# Patient Record
Sex: Male | Born: 1940 | Race: White | Hispanic: No | Marital: Married | Smoking: Former smoker
Health system: Southern US, Community
[De-identification: ages and names within clinical notes are randomized; demographics above are authoritative.]

## PROBLEM LIST (undated history)

## (undated) ENCOUNTER — Ambulatory Visit

## (undated) ENCOUNTER — Telehealth

## (undated) ENCOUNTER — Encounter

## (undated) ENCOUNTER — Ambulatory Visit: Payer: MEDICARE

## (undated) ENCOUNTER — Encounter
Attending: Student in an Organized Health Care Education/Training Program | Primary: Student in an Organized Health Care Education/Training Program

## (undated) ENCOUNTER — Ambulatory Visit: Payer: MEDICARE | Attending: Internal Medicine | Primary: Internal Medicine

## (undated) ENCOUNTER — Encounter: Attending: Internal Medicine | Primary: Internal Medicine

## (undated) ENCOUNTER — Other Ambulatory Visit

## (undated) ENCOUNTER — Encounter: Attending: Gastroenterology | Primary: Gastroenterology

## (undated) ENCOUNTER — Telehealth: Attending: Emergency Medicine | Primary: Emergency Medicine

## (undated) ENCOUNTER — Ambulatory Visit: Payer: MEDICARE | Attending: Specialist | Primary: Specialist

## (undated) ENCOUNTER — Inpatient Hospital Stay

## (undated) ENCOUNTER — Telehealth: Attending: Internal Medicine | Primary: Internal Medicine

## (undated) ENCOUNTER — Ambulatory Visit: Payer: MEDICARE | Attending: Gastroenterology | Primary: Gastroenterology

## (undated) ENCOUNTER — Ambulatory Visit
Payer: MEDICARE | Attending: Student in an Organized Health Care Education/Training Program | Primary: Student in an Organized Health Care Education/Training Program

## (undated) ENCOUNTER — Encounter: Attending: General Acute Care Hospital | Primary: General Acute Care Hospital

## (undated) ENCOUNTER — Ambulatory Visit: Payer: MEDICARE | Attending: Nurse Practitioner | Primary: Nurse Practitioner

## (undated) ENCOUNTER — Encounter: Attending: Specialist | Primary: Specialist

## (undated) ENCOUNTER — Ambulatory Visit: Payer: BLUE CROSS/BLUE SHIELD

## (undated) ENCOUNTER — Telehealth: Attending: Gastroenterology | Primary: Gastroenterology

## (undated) ENCOUNTER — Ambulatory Visit
Payer: BLUE CROSS/BLUE SHIELD | Attending: Student in an Organized Health Care Education/Training Program | Primary: Student in an Organized Health Care Education/Training Program

## (undated) DIAGNOSIS — C229 Malignant neoplasm of liver, not specified as primary or secondary: Secondary | ICD-10-CM

## (undated) DIAGNOSIS — K5090 Crohn's disease, unspecified, without complications: Secondary | ICD-10-CM

---

## 2017-08-01 ENCOUNTER — Ambulatory Visit: Admission: RE | Admit: 2017-08-01 | Discharge: 2017-08-01 | Payer: MEDICARE

## 2018-01-20 ENCOUNTER — Ambulatory Visit: Admit: 2018-01-20 | Discharge: 2018-01-22 | Disposition: A | Discharge status: Home / Self Care | Payer: MEDICARE

## 2018-01-20 DIAGNOSIS — K56609 Unspecified intestinal obstruction, unspecified as to partial versus complete obstruction: Principal | ICD-10-CM

## 2018-01-20 DIAGNOSIS — I10 Essential (primary) hypertension: Secondary | ICD-10-CM | POA: Diagnosis present

## 2018-01-21 DIAGNOSIS — K56609 Unspecified intestinal obstruction, unspecified as to partial versus complete obstruction: Principal | ICD-10-CM

## 2018-01-29 ENCOUNTER — Ambulatory Visit: Admit: 2018-01-29 | Discharge: 2018-01-30 | Payer: MEDICARE

## 2018-01-29 ENCOUNTER — Ambulatory Visit: Admit: 2018-01-29 | Discharge: 2018-01-30 | Payer: MEDICARE | Attending: Adult Health | Primary: Adult Health

## 2018-01-29 DIAGNOSIS — K435 Parastomal hernia without obstruction or  gangrene: Secondary | ICD-10-CM

## 2018-01-29 DIAGNOSIS — K7581 Nonalcoholic steatohepatitis (NASH): Secondary | ICD-10-CM

## 2018-01-29 DIAGNOSIS — K56609 Unspecified intestinal obstruction, unspecified as to partial versus complete obstruction: Secondary | ICD-10-CM

## 2018-01-29 DIAGNOSIS — K50812 Crohn's disease of both small and large intestine with intestinal obstruction: Principal | ICD-10-CM

## 2018-02-02 ENCOUNTER — Ambulatory Visit: Admit: 2018-02-02 | Discharge: 2018-02-03 | Payer: MEDICARE

## 2018-02-02 DIAGNOSIS — R109 Unspecified abdominal pain: Principal | ICD-10-CM

## 2018-02-11 ENCOUNTER — Ambulatory Visit: Admit: 2018-02-11 | Discharge: 2018-02-12 | Payer: MEDICARE | Attending: Specialist | Primary: Specialist

## 2018-02-11 DIAGNOSIS — K435 Parastomal hernia without obstruction or  gangrene: Secondary | ICD-10-CM

## 2018-02-11 DIAGNOSIS — K56609 Unspecified intestinal obstruction, unspecified as to partial versus complete obstruction: Principal | ICD-10-CM

## 2018-02-16 ENCOUNTER — Ambulatory Visit: Admit: 2018-02-16 | Discharge: 2018-02-17 | Payer: MEDICARE

## 2018-03-19 ENCOUNTER — Ambulatory Visit: Admit: 2018-03-19 | Discharge: 2018-03-19 | Payer: MEDICARE

## 2018-03-19 ENCOUNTER — Encounter
Admit: 2018-03-19 | Discharge: 2018-03-19 | Payer: MEDICARE | Attending: Certified Registered" | Primary: Certified Registered"

## 2018-03-19 DIAGNOSIS — K5080 Crohn's disease of both small and large intestine without complications: Principal | ICD-10-CM

## 2018-04-08 ENCOUNTER — Ambulatory Visit: Admit: 2018-04-08 | Discharge: 2018-04-09 | Payer: MEDICARE | Attending: Specialist | Primary: Specialist

## 2018-04-08 DIAGNOSIS — K435 Parastomal hernia without obstruction or  gangrene: Principal | ICD-10-CM

## 2018-04-08 MED ORDER — PANTOPRAZOLE 40 MG TABLET,DELAYED RELEASE
ORAL_TABLET | Freq: Every day | ORAL | 0 refills | 0 days | Status: CP
Start: 2018-04-08 — End: ?

## 2018-04-29 ENCOUNTER — Ambulatory Visit: Admit: 2018-04-29 | Discharge: 2018-04-30 | Payer: MEDICARE | Attending: Gastroenterology | Primary: Gastroenterology

## 2018-04-29 DIAGNOSIS — K5730 Diverticulosis of large intestine without perforation or abscess without bleeding: Secondary | ICD-10-CM

## 2018-04-29 DIAGNOSIS — K760 Fatty (change of) liver, not elsewhere classified: Secondary | ICD-10-CM

## 2018-04-29 DIAGNOSIS — E6609 Other obesity due to excess calories: Secondary | ICD-10-CM

## 2018-04-29 DIAGNOSIS — R945 Abnormal results of liver function studies: Secondary | ICD-10-CM

## 2018-04-29 DIAGNOSIS — K5080 Crohn's disease of both small and large intestine without complications: Secondary | ICD-10-CM

## 2018-04-29 DIAGNOSIS — Z6830 Body mass index (BMI) 30.0-30.9, adult: Secondary | ICD-10-CM

## 2018-04-29 DIAGNOSIS — K7581 Nonalcoholic steatohepatitis (NASH): Secondary | ICD-10-CM

## 2018-04-29 DIAGNOSIS — K5289 Other specified noninfective gastroenteritis and colitis: Secondary | ICD-10-CM

## 2018-04-29 DIAGNOSIS — K50819 Crohn's disease of both small and large intestine with unspecified complications: Principal | ICD-10-CM

## 2018-05-07 ENCOUNTER — Ambulatory Visit: Admit: 2018-05-07 | Discharge: 2018-05-08 | Payer: MEDICARE

## 2018-05-07 DIAGNOSIS — I509 Heart failure, unspecified: Secondary | ICD-10-CM

## 2018-05-07 DIAGNOSIS — I420 Dilated cardiomyopathy: Principal | ICD-10-CM

## 2018-05-25 ENCOUNTER — Ambulatory Visit: Admit: 2018-05-25 | Discharge: 2018-05-26 | Payer: MEDICARE

## 2018-05-25 DIAGNOSIS — Z01818 Encounter for other preprocedural examination: Principal | ICD-10-CM

## 2018-05-25 DIAGNOSIS — K435 Parastomal hernia without obstruction or  gangrene: Principal | ICD-10-CM

## 2018-06-02 ENCOUNTER — Ambulatory Visit
Admit: 2018-06-02 | Discharge: 2018-06-09 | Disposition: A | Discharge status: Home / Self Care | Payer: MEDICARE | Admitting: Specialist

## 2018-06-02 ENCOUNTER — Encounter
Admit: 2018-06-02 | Discharge: 2018-06-09 | Disposition: A | Discharge status: Home / Self Care | Payer: MEDICARE | Attending: Anesthesiology | Admitting: Specialist

## 2018-06-02 DIAGNOSIS — K435 Parastomal hernia without obstruction or  gangrene: Principal | ICD-10-CM

## 2018-06-04 DIAGNOSIS — K435 Parastomal hernia without obstruction or  gangrene: Principal | ICD-10-CM

## 2018-06-09 MED ORDER — OXYCODONE 5 MG TABLET
ORAL_TABLET | Freq: Three times a day (TID) | ORAL | 0 refills | 0.00000 days | Status: CP | PRN
Start: 2018-06-09 — End: 2018-06-23

## 2018-06-12 ENCOUNTER — Ambulatory Visit: Admit: 2018-06-12 | Discharge: 2018-06-13 | Payer: MEDICARE | Attending: Specialist | Primary: Specialist

## 2018-06-12 DIAGNOSIS — K435 Parastomal hernia without obstruction or  gangrene: Principal | ICD-10-CM

## 2018-06-18 ENCOUNTER — Ambulatory Visit: Admit: 2018-06-18 | Discharge: 2018-06-23 | Disposition: A | Discharge status: Home / Self Care | Payer: MEDICARE

## 2018-06-18 DIAGNOSIS — K56609 Unspecified intestinal obstruction, unspecified as to partial versus complete obstruction: Principal | ICD-10-CM

## 2018-06-19 DIAGNOSIS — K56609 Unspecified intestinal obstruction, unspecified as to partial versus complete obstruction: Principal | ICD-10-CM

## 2018-06-20 DIAGNOSIS — K56609 Unspecified intestinal obstruction, unspecified as to partial versus complete obstruction: Principal | ICD-10-CM

## 2018-06-23 MED ORDER — CIPROFLOXACIN 500 MG TABLET
ORAL_TABLET | Freq: Two times a day (BID) | ORAL | 0 refills | 0 days | Status: CP
Start: 2018-06-23 — End: 2018-06-25

## 2018-06-24 ENCOUNTER — Ambulatory Visit: Admit: 2018-06-24 | Discharge: 2018-06-25 | Payer: MEDICARE | Attending: Specialist | Primary: Specialist

## 2018-06-24 DIAGNOSIS — K435 Parastomal hernia without obstruction or  gangrene: Principal | ICD-10-CM

## 2018-07-08 ENCOUNTER — Ambulatory Visit: Admit: 2018-07-08 | Discharge: 2018-07-09 | Payer: MEDICARE

## 2018-07-08 ENCOUNTER — Ambulatory Visit: Admit: 2018-07-08 | Discharge: 2018-07-09 | Payer: MEDICARE | Attending: Gastroenterology | Primary: Gastroenterology

## 2018-07-08 DIAGNOSIS — Z8719 Personal history of other diseases of the digestive system: Principal | ICD-10-CM

## 2018-07-08 DIAGNOSIS — R197 Diarrhea, unspecified: Principal | ICD-10-CM

## 2018-07-08 DIAGNOSIS — K50819 Crohn's disease of both small and large intestine with unspecified complications: Secondary | ICD-10-CM

## 2018-07-09 ENCOUNTER — Ambulatory Visit: Admit: 2018-07-09 | Discharge: 2018-07-09 | Payer: MEDICARE

## 2018-07-09 DIAGNOSIS — R945 Abnormal results of liver function studies: Secondary | ICD-10-CM

## 2018-07-09 DIAGNOSIS — K7581 Nonalcoholic steatohepatitis (NASH): Secondary | ICD-10-CM

## 2018-07-09 DIAGNOSIS — K50819 Crohn's disease of both small and large intestine with unspecified complications: Principal | ICD-10-CM

## 2018-07-09 DIAGNOSIS — K5289 Other specified noninfective gastroenteritis and colitis: Secondary | ICD-10-CM

## 2018-07-10 MED ORDER — PREDNISONE 20 MG TABLET
ORAL_TABLET | 0 refills | 0 days | Status: CP
Start: 2018-07-10 — End: 2019-01-11

## 2018-07-22 ENCOUNTER — Ambulatory Visit: Admit: 2018-07-22 | Discharge: 2018-07-23 | Payer: MEDICARE | Attending: Specialist | Primary: Specialist

## 2018-07-22 DIAGNOSIS — K435 Parastomal hernia without obstruction or  gangrene: Principal | ICD-10-CM

## 2018-07-23 ENCOUNTER — Ambulatory Visit: Admit: 2018-07-23 | Discharge: 2018-07-24 | Payer: MEDICARE | Attending: Gastroenterology | Primary: Gastroenterology

## 2018-07-23 DIAGNOSIS — K5080 Crohn's disease of both small and large intestine without complications: Principal | ICD-10-CM

## 2018-07-23 DIAGNOSIS — K7581 Nonalcoholic steatohepatitis (NASH): Secondary | ICD-10-CM

## 2018-07-23 DIAGNOSIS — K5730 Diverticulosis of large intestine without perforation or abscess without bleeding: Secondary | ICD-10-CM

## 2018-07-23 DIAGNOSIS — K5289 Other specified noninfective gastroenteritis and colitis: Secondary | ICD-10-CM

## 2018-07-23 DIAGNOSIS — R945 Abnormal results of liver function studies: Secondary | ICD-10-CM

## 2018-08-10 ENCOUNTER — Emergency Department
Admit: 2018-08-10 | Discharge: 2018-08-10 | Disposition: A | Discharge status: Other Acute Inpatient Hospital | Payer: MEDICARE | Attending: Emergency Medicine

## 2018-08-10 ENCOUNTER — Ambulatory Visit
Admit: 2018-08-10 | Discharge: 2018-08-12 | Disposition: A | Discharge status: Home / Self Care | Payer: MEDICARE | Source: Other Acute Inpatient Hospital | Admitting: Specialist

## 2018-08-10 ENCOUNTER — Encounter
Admit: 2018-08-10 | Discharge: 2018-08-12 | Disposition: A | Discharge status: Home / Self Care | Payer: MEDICARE | Source: Other Acute Inpatient Hospital | Attending: Anesthesiology | Admitting: Specialist

## 2018-08-10 ENCOUNTER — Ambulatory Visit
Admit: 2018-08-10 | Discharge: 2018-08-10 | Disposition: A | Discharge status: Other Acute Inpatient Hospital | Payer: MEDICARE | Attending: Emergency Medicine

## 2018-08-10 DIAGNOSIS — L02211 Cutaneous abscess of abdominal wall: Principal | ICD-10-CM

## 2018-08-10 DIAGNOSIS — L539 Erythematous condition, unspecified: Principal | ICD-10-CM

## 2018-08-11 DIAGNOSIS — L02211 Cutaneous abscess of abdominal wall: Principal | ICD-10-CM

## 2018-08-12 MED ORDER — AMOXICILLIN 875 MG-POTASSIUM CLAVULANATE 125 MG TABLET
ORAL_TABLET | Freq: Two times a day (BID) | ORAL | 0 refills | 0.00000 days | Status: CP
Start: 2018-08-12 — End: 2018-08-22
  Filled 2018-08-12: qty 20, 10d supply, fill #0

## 2018-08-12 MED ORDER — OXYCODONE 5 MG TABLET
ORAL_TABLET | ORAL | 0 refills | 0.00000 days | Status: CP | PRN
Start: 2018-08-12 — End: 2018-08-17
  Filled 2018-08-12: qty 15, 3d supply, fill #0

## 2018-08-12 MED FILL — OXYCODONE 5 MG TABLET: 3 days supply | Qty: 15 | Fill #0 | Status: AC

## 2018-08-12 MED FILL — AMOXICILLIN 875 MG-POTASSIUM CLAVULANATE 125 MG TABLET: 10 days supply | Qty: 20 | Fill #0 | Status: AC

## 2018-08-19 ENCOUNTER — Ambulatory Visit: Admit: 2018-08-19 | Discharge: 2018-08-20 | Payer: MEDICARE | Attending: Specialist | Primary: Specialist

## 2018-08-19 DIAGNOSIS — K435 Parastomal hernia without obstruction or  gangrene: Principal | ICD-10-CM

## 2018-08-28 ENCOUNTER — Ambulatory Visit: Admit: 2018-08-28 | Discharge: 2018-08-29 | Payer: MEDICARE | Attending: Specialist | Primary: Specialist

## 2018-08-28 DIAGNOSIS — K435 Parastomal hernia without obstruction or  gangrene: Principal | ICD-10-CM

## 2018-09-16 ENCOUNTER — Ambulatory Visit: Admit: 2018-09-16 | Discharge: 2018-09-17 | Payer: MEDICARE | Attending: Specialist | Primary: Specialist

## 2018-09-16 DIAGNOSIS — K435 Parastomal hernia without obstruction or  gangrene: Principal | ICD-10-CM

## 2018-10-15 ENCOUNTER — Ambulatory Visit: Admit: 2018-10-15 | Discharge: 2018-10-16 | Payer: MEDICARE

## 2018-10-15 DIAGNOSIS — K5080 Crohn's disease of both small and large intestine without complications: Principal | ICD-10-CM

## 2018-10-27 ENCOUNTER — Ambulatory Visit: Admit: 2018-10-27 | Discharge: 2018-10-28 | Payer: MEDICARE | Attending: Gastroenterology | Primary: Gastroenterology

## 2018-10-27 DIAGNOSIS — K5080 Crohn's disease of both small and large intestine without complications: Principal | ICD-10-CM

## 2018-10-27 DIAGNOSIS — K7581 Nonalcoholic steatohepatitis (NASH): Secondary | ICD-10-CM

## 2018-10-27 DIAGNOSIS — L52 Erythema nodosum: Secondary | ICD-10-CM

## 2018-10-27 DIAGNOSIS — K5289 Other specified noninfective gastroenteritis and colitis: Secondary | ICD-10-CM

## 2018-10-27 DIAGNOSIS — R945 Abnormal results of liver function studies: Secondary | ICD-10-CM

## 2019-01-13 ENCOUNTER — Encounter
Admit: 2019-01-13 | Discharge: 2019-01-13 | Payer: MEDICARE | Attending: Student in an Organized Health Care Education/Training Program | Primary: Student in an Organized Health Care Education/Training Program

## 2019-01-13 ENCOUNTER — Ambulatory Visit: Admit: 2019-01-13 | Discharge: 2019-01-13 | Payer: MEDICARE

## 2019-01-13 DIAGNOSIS — H2511 Age-related nuclear cataract, right eye: Principal | ICD-10-CM

## 2019-01-13 MED ORDER — PREDNISOLONE ACETATE 1 % EYE DROPS,SUSPENSION
Freq: Four times a day (QID) | OPHTHALMIC | 0 refills | 0 days
Start: 2019-01-13 — End: 2019-03-31

## 2019-01-27 ENCOUNTER — Encounter
Admit: 2019-01-27 | Discharge: 2019-01-27 | Payer: MEDICARE | Attending: Certified Registered" | Primary: Certified Registered"

## 2019-01-27 ENCOUNTER — Ambulatory Visit: Admit: 2019-01-27 | Discharge: 2019-01-27 | Payer: MEDICARE

## 2019-01-27 DIAGNOSIS — H2512 Age-related nuclear cataract, left eye: Principal | ICD-10-CM

## 2019-01-27 MED ORDER — PREDNISOLONE ACETATE 1 % EYE DROPS,SUSPENSION
Freq: Four times a day (QID) | OPHTHALMIC | 0 refills | 0 days
Start: 2019-01-27 — End: 2019-03-31

## 2019-02-01 ENCOUNTER — Other Ambulatory Visit: Admit: 2019-02-01 | Discharge: 2019-02-02 | Payer: MEDICARE

## 2019-02-01 DIAGNOSIS — R945 Abnormal results of liver function studies: Secondary | ICD-10-CM

## 2019-02-01 DIAGNOSIS — K5080 Crohn's disease of both small and large intestine without complications: Principal | ICD-10-CM

## 2019-02-11 ENCOUNTER — Ambulatory Visit: Admit: 2019-02-11 | Discharge: 2019-02-12 | Payer: MEDICARE | Attending: Gastroenterology | Primary: Gastroenterology

## 2019-02-11 DIAGNOSIS — G4730 Sleep apnea, unspecified: Principal | ICD-10-CM

## 2019-02-11 DIAGNOSIS — I509 Heart failure, unspecified: Principal | ICD-10-CM

## 2019-02-11 DIAGNOSIS — K760 Fatty (change of) liver, not elsewhere classified: Principal | ICD-10-CM

## 2019-02-11 DIAGNOSIS — Z933 Colostomy status: Principal | ICD-10-CM

## 2019-02-11 DIAGNOSIS — N200 Calculus of kidney: Principal | ICD-10-CM

## 2019-02-11 DIAGNOSIS — K5090 Crohn's disease, unspecified, without complications: Principal | ICD-10-CM

## 2019-02-11 DIAGNOSIS — R55 Syncope and collapse: Principal | ICD-10-CM

## 2019-02-11 DIAGNOSIS — K5289 Other specified noninfective gastroenteritis and colitis: Principal | ICD-10-CM

## 2019-02-11 DIAGNOSIS — K219 Gastro-esophageal reflux disease without esophagitis: Principal | ICD-10-CM

## 2019-02-11 DIAGNOSIS — Z9289 Personal history of other medical treatment: Principal | ICD-10-CM

## 2019-02-11 DIAGNOSIS — C801 Malignant (primary) neoplasm, unspecified: Principal | ICD-10-CM

## 2019-02-11 DIAGNOSIS — I10 Essential (primary) hypertension: Principal | ICD-10-CM

## 2019-02-11 DIAGNOSIS — K5080 Crohn's disease of both small and large intestine without complications: Principal | ICD-10-CM

## 2019-03-30 ENCOUNTER — Ambulatory Visit: Admit: 2019-03-30 | Discharge: 2019-03-31 | Payer: MEDICARE | Attending: Gastroenterology | Primary: Gastroenterology

## 2019-03-30 DIAGNOSIS — K5080 Crohn's disease of both small and large intestine without complications: Principal | ICD-10-CM

## 2019-03-30 DIAGNOSIS — K5289 Other specified noninfective gastroenteritis and colitis: Secondary | ICD-10-CM

## 2019-03-30 DIAGNOSIS — K760 Fatty (change of) liver, not elsewhere classified: Secondary | ICD-10-CM

## 2019-03-30 DIAGNOSIS — K50813 Crohn's disease of both small and large intestine with fistula: Secondary | ICD-10-CM

## 2019-03-30 DIAGNOSIS — Z933 Colostomy status: Secondary | ICD-10-CM

## 2019-03-30 MED ORDER — CIPROFLOXACIN 500 MG TABLET
ORAL_TABLET | Freq: Two times a day (BID) | ORAL | 0 refills | 0.00000 days | Status: CP
Start: 2019-03-30 — End: 2019-07-29

## 2019-03-30 MED ORDER — METRONIDAZOLE 500 MG TABLET
ORAL_TABLET | Freq: Four times a day (QID) | ORAL | 0 refills | 0 days | Status: CP
Start: 2019-03-30 — End: 2019-07-29

## 2019-04-14 ENCOUNTER — Ambulatory Visit: Admit: 2019-04-14 | Discharge: 2019-04-15 | Payer: MEDICARE | Attending: Gastroenterology | Primary: Gastroenterology

## 2019-04-14 DIAGNOSIS — Z432 Encounter for attention to ileostomy: Secondary | ICD-10-CM

## 2019-04-14 DIAGNOSIS — K5080 Crohn's disease of both small and large intestine without complications: Principal | ICD-10-CM

## 2019-04-14 DIAGNOSIS — R945 Abnormal results of liver function studies: Secondary | ICD-10-CM

## 2019-04-14 DIAGNOSIS — K7581 Nonalcoholic steatohepatitis (NASH): Secondary | ICD-10-CM

## 2019-05-12 ENCOUNTER — Ambulatory Visit: Admit: 2019-05-12 | Discharge: 2019-05-12 | Payer: MEDICARE

## 2019-05-12 DIAGNOSIS — R609 Edema, unspecified: Secondary | ICD-10-CM

## 2019-05-12 DIAGNOSIS — R0602 Shortness of breath: Principal | ICD-10-CM

## 2019-07-08 ENCOUNTER — Ambulatory Visit: Admit: 2019-07-08 | Discharge: 2019-07-09 | Payer: MEDICARE

## 2019-07-08 DIAGNOSIS — K7581 Nonalcoholic steatohepatitis (NASH): Secondary | ICD-10-CM

## 2019-07-08 DIAGNOSIS — R945 Abnormal results of liver function studies: Secondary | ICD-10-CM

## 2019-07-08 DIAGNOSIS — K5080 Crohn's disease of both small and large intestine without complications: Principal | ICD-10-CM

## 2019-07-29 DIAGNOSIS — R109 Unspecified abdominal pain: Principal | ICD-10-CM

## 2019-07-30 ENCOUNTER — Ambulatory Visit
Admit: 2019-07-30 | Discharge: 2019-07-30 | Disposition: A | Discharge status: Home / Self Care | Payer: MEDICARE | Attending: Emergency Medicine

## 2019-07-30 ENCOUNTER — Emergency Department
Admit: 2019-07-30 | Discharge: 2019-07-30 | Disposition: A | Discharge status: Home / Self Care | Payer: MEDICARE | Attending: Emergency Medicine

## 2019-07-30 MED ORDER — PENICILLIN V POTASSIUM 500 MG TABLET
ORAL_TABLET | Freq: Three times a day (TID) | ORAL | 0 refills | 7 days | Status: CP
Start: 2019-07-30 — End: 2019-07-30

## 2019-07-30 MED ORDER — CLINDAMYCIN HCL 300 MG CAPSULE
ORAL_CAPSULE | Freq: Three times a day (TID) | ORAL | 0 refills | 7.00000 days | Status: CP
Start: 2019-07-30 — End: 2019-08-06

## 2019-07-30 MED ORDER — SULFAMETHOXAZOLE 400 MG-TRIMETHOPRIM 80 MG TABLET
ORAL_TABLET | Freq: Two times a day (BID) | ORAL | 0 refills | 7 days | Status: CP
Start: 2019-07-30 — End: 2019-07-30

## 2019-09-05 ENCOUNTER — Ambulatory Visit: Admit: 2019-09-05 | Discharge: 2019-09-07 | Disposition: A | Discharge status: Home / Self Care | Payer: MEDICARE

## 2019-09-05 DIAGNOSIS — K7581 Nonalcoholic steatohepatitis (NASH): Secondary | ICD-10-CM

## 2019-09-05 DIAGNOSIS — R918 Other nonspecific abnormal finding of lung field: Secondary | ICD-10-CM

## 2019-09-05 DIAGNOSIS — Z87891 Personal history of nicotine dependence: Secondary | ICD-10-CM

## 2019-09-05 DIAGNOSIS — Z7982 Long term (current) use of aspirin: Secondary | ICD-10-CM

## 2019-09-05 DIAGNOSIS — N200 Calculus of kidney: Secondary | ICD-10-CM

## 2019-09-05 DIAGNOSIS — Z85828 Personal history of other malignant neoplasm of skin: Secondary | ICD-10-CM

## 2019-09-05 DIAGNOSIS — Z87442 Personal history of urinary calculi: Secondary | ICD-10-CM

## 2019-09-05 DIAGNOSIS — Z79899 Other long term (current) drug therapy: Secondary | ICD-10-CM

## 2019-09-05 DIAGNOSIS — Z9989 Dependence on other enabling machines and devices: Secondary | ICD-10-CM

## 2019-09-05 DIAGNOSIS — M8580 Other specified disorders of bone density and structure, unspecified site: Secondary | ICD-10-CM

## 2019-09-05 DIAGNOSIS — K56691 Other complete intestinal obstruction: Secondary | ICD-10-CM

## 2019-09-05 DIAGNOSIS — I420 Dilated cardiomyopathy: Secondary | ICD-10-CM

## 2019-09-05 DIAGNOSIS — Z9049 Acquired absence of other specified parts of digestive tract: Secondary | ICD-10-CM

## 2019-09-05 DIAGNOSIS — K565 Intestinal adhesions [bands], unspecified as to partial versus complete obstruction: Secondary | ICD-10-CM

## 2019-09-05 DIAGNOSIS — Z9842 Cataract extraction status, left eye: Secondary | ICD-10-CM

## 2019-09-05 DIAGNOSIS — G4733 Obstructive sleep apnea (adult) (pediatric): Secondary | ICD-10-CM

## 2019-09-05 DIAGNOSIS — K50012 Crohn's disease of small intestine with intestinal obstruction: Secondary | ICD-10-CM

## 2019-09-05 DIAGNOSIS — Z933 Colostomy status: Secondary | ICD-10-CM

## 2019-09-05 DIAGNOSIS — I110 Hypertensive heart disease with heart failure: Secondary | ICD-10-CM

## 2019-09-05 DIAGNOSIS — Z8719 Personal history of other diseases of the digestive system: Secondary | ICD-10-CM

## 2019-09-05 DIAGNOSIS — Z960 Presence of urogenital implants: Secondary | ICD-10-CM

## 2019-09-05 DIAGNOSIS — K219 Gastro-esophageal reflux disease without esophagitis: Secondary | ICD-10-CM

## 2019-09-05 DIAGNOSIS — I509 Heart failure, unspecified: Secondary | ICD-10-CM

## 2019-09-05 DIAGNOSIS — Z961 Presence of intraocular lens: Secondary | ICD-10-CM

## 2019-09-05 DIAGNOSIS — K56609 Unspecified intestinal obstruction, unspecified as to partial versus complete obstruction: Secondary | ICD-10-CM

## 2019-09-05 DIAGNOSIS — K828 Other specified diseases of gallbladder: Secondary | ICD-10-CM

## 2019-09-05 DIAGNOSIS — R1084 Generalized abdominal pain: Secondary | ICD-10-CM

## 2019-09-05 DIAGNOSIS — K862 Cyst of pancreas: Secondary | ICD-10-CM

## 2019-09-05 DIAGNOSIS — Z9841 Cataract extraction status, right eye: Secondary | ICD-10-CM

## 2019-09-06 DIAGNOSIS — Z87891 Personal history of nicotine dependence: Secondary | ICD-10-CM

## 2019-09-06 DIAGNOSIS — Z9842 Cataract extraction status, left eye: Secondary | ICD-10-CM

## 2019-09-06 DIAGNOSIS — Z85828 Personal history of other malignant neoplasm of skin: Secondary | ICD-10-CM

## 2019-09-06 DIAGNOSIS — K565 Intestinal adhesions [bands], unspecified as to partial versus complete obstruction: Secondary | ICD-10-CM

## 2019-09-06 DIAGNOSIS — N200 Calculus of kidney: Secondary | ICD-10-CM

## 2019-09-06 DIAGNOSIS — Z9841 Cataract extraction status, right eye: Secondary | ICD-10-CM

## 2019-09-06 DIAGNOSIS — Z9049 Acquired absence of other specified parts of digestive tract: Secondary | ICD-10-CM

## 2019-09-06 DIAGNOSIS — R1084 Generalized abdominal pain: Secondary | ICD-10-CM

## 2019-09-06 DIAGNOSIS — G4733 Obstructive sleep apnea (adult) (pediatric): Secondary | ICD-10-CM

## 2019-09-06 DIAGNOSIS — R918 Other nonspecific abnormal finding of lung field: Secondary | ICD-10-CM

## 2019-09-06 DIAGNOSIS — Z7982 Long term (current) use of aspirin: Secondary | ICD-10-CM

## 2019-09-06 DIAGNOSIS — K7581 Nonalcoholic steatohepatitis (NASH): Secondary | ICD-10-CM

## 2019-09-06 DIAGNOSIS — K862 Cyst of pancreas: Secondary | ICD-10-CM

## 2019-09-06 DIAGNOSIS — I420 Dilated cardiomyopathy: Secondary | ICD-10-CM

## 2019-09-06 DIAGNOSIS — Z933 Colostomy status: Secondary | ICD-10-CM

## 2019-09-06 DIAGNOSIS — I509 Heart failure, unspecified: Secondary | ICD-10-CM

## 2019-09-06 DIAGNOSIS — Z960 Presence of urogenital implants: Secondary | ICD-10-CM

## 2019-09-06 DIAGNOSIS — Z79899 Other long term (current) drug therapy: Secondary | ICD-10-CM

## 2019-09-06 DIAGNOSIS — Z961 Presence of intraocular lens: Secondary | ICD-10-CM

## 2019-09-06 DIAGNOSIS — K50012 Crohn's disease of small intestine with intestinal obstruction: Secondary | ICD-10-CM

## 2019-09-06 DIAGNOSIS — M8580 Other specified disorders of bone density and structure, unspecified site: Secondary | ICD-10-CM

## 2019-09-06 DIAGNOSIS — K828 Other specified diseases of gallbladder: Secondary | ICD-10-CM

## 2019-09-06 DIAGNOSIS — Z9989 Dependence on other enabling machines and devices: Secondary | ICD-10-CM

## 2019-09-06 DIAGNOSIS — K219 Gastro-esophageal reflux disease without esophagitis: Secondary | ICD-10-CM

## 2019-09-06 DIAGNOSIS — I110 Hypertensive heart disease with heart failure: Secondary | ICD-10-CM

## 2019-09-06 DIAGNOSIS — K56691 Other complete intestinal obstruction: Secondary | ICD-10-CM

## 2019-09-06 DIAGNOSIS — Z87442 Personal history of urinary calculi: Secondary | ICD-10-CM

## 2019-09-21 ENCOUNTER — Ambulatory Visit
Admit: 2019-09-21 | Discharge: 2019-09-22 | Payer: MEDICARE | Attending: Nurse Practitioner | Primary: Nurse Practitioner

## 2019-09-21 DIAGNOSIS — K5080 Crohn's disease of both small and large intestine without complications: Principal | ICD-10-CM

## 2019-09-21 DIAGNOSIS — K7581 Nonalcoholic steatohepatitis (NASH): Principal | ICD-10-CM

## 2019-09-21 DIAGNOSIS — Z1159 Encounter for screening for other viral diseases: Principal | ICD-10-CM

## 2019-10-02 ENCOUNTER — Ambulatory Visit
Admit: 2019-10-02 | Discharge: 2019-10-02 | Discharge status: Against Medical Advice | Payer: MEDICARE | Attending: Emergency Medicine

## 2020-03-25 ENCOUNTER — Ambulatory Visit: Admit: 2020-03-25 | Discharge: 2020-03-26 | Payer: MEDICARE | Attending: Family Medicine | Primary: Family Medicine

## 2020-03-25 ENCOUNTER — Encounter: Admit: 2020-03-25 | Discharge: 2020-03-25 | Payer: MEDICARE | Attending: Family Medicine | Primary: Family Medicine

## 2020-03-25 DIAGNOSIS — Z01818 Encounter for other preprocedural examination: Principal | ICD-10-CM

## 2020-03-28 ENCOUNTER — Encounter
Admit: 2020-03-28 | Discharge: 2020-03-28 | Payer: MEDICARE | Attending: Critical Care Medicine | Primary: Critical Care Medicine

## 2020-03-28 ENCOUNTER — Ambulatory Visit: Admit: 2020-03-28 | Discharge: 2020-03-28 | Payer: MEDICARE

## 2020-05-01 ENCOUNTER — Ambulatory Visit: Admit: 2020-05-01 | Discharge: 2020-05-02 | Payer: MEDICARE

## 2020-05-01 ENCOUNTER — Ambulatory Visit: Admit: 2020-05-01 | Discharge: 2020-05-02 | Payer: MEDICARE | Attending: Specialist | Primary: Specialist

## 2020-05-01 DIAGNOSIS — R109 Unspecified abdominal pain: Principal | ICD-10-CM

## 2020-05-19 ENCOUNTER — Ambulatory Visit: Admit: 2020-05-19 | Discharge: 2020-05-20 | Payer: MEDICARE

## 2020-09-20 ENCOUNTER — Other Ambulatory Visit: Payer: Self-pay

## 2020-09-20 DIAGNOSIS — K56600 Partial intestinal obstruction, unspecified as to cause: Secondary | ICD-10-CM | POA: Diagnosis not present

## 2020-09-20 DIAGNOSIS — L03311 Cellulitis of abdominal wall: Secondary | ICD-10-CM | POA: Diagnosis not present

## 2020-09-20 DIAGNOSIS — K632 Fistula of intestine: Secondary | ICD-10-CM | POA: Insufficient documentation

## 2020-09-20 DIAGNOSIS — Z20822 Contact with and (suspected) exposure to covid-19: Secondary | ICD-10-CM | POA: Diagnosis not present

## 2020-09-20 DIAGNOSIS — R109 Unspecified abdominal pain: Secondary | ICD-10-CM | POA: Diagnosis present

## 2020-09-20 LAB — COMPREHENSIVE METABOLIC PANEL
ALT: 25 U/L (ref 0–44)
AST: 32 U/L (ref 15–41)
Albumin: 3.4 g/dL — ABNORMAL LOW (ref 3.5–5.0)
Alkaline Phosphatase: 154 U/L — ABNORMAL HIGH (ref 38–126)
Anion gap: 10 (ref 5–15)
BUN: 17 mg/dL (ref 8–23)
CO2: 24 mmol/L (ref 22–32)
Calcium: 8.6 mg/dL — ABNORMAL LOW (ref 8.9–10.3)
Chloride: 107 mmol/L (ref 98–111)
Creatinine, Ser: 1.08 mg/dL (ref 0.61–1.24)
GFR, Estimated: >60 mL/min (ref >60)
Glucose, Bld: 145 mg/dL — ABNORMAL HIGH (ref 70–99)
Potassium: 3.8 mmol/L (ref 3.5–5.1)
Sodium: 141 mmol/L (ref 135–145)
Total Bilirubin: 0.6 mg/dL (ref 0.3–1.2)
Total Protein: 7.4 g/dL (ref 6.5–8.1)

## 2020-09-20 LAB — CBC
HCT: 36.9 % — ABNORMAL LOW (ref 39.0–52.0)
Hemoglobin: 12.7 g/dL — ABNORMAL LOW (ref 13.0–17.0)
MCH: 30.1 pg (ref 26.0–34.0)
MCHC: 34.4 g/dL (ref 30.0–36.0)
MCV: 87.4 fL (ref 80.0–100.0)
Platelets: 287 10*3/uL (ref 150–400)
RBC: 4.22 MIL/uL (ref 4.22–5.81)
RDW: 15.9 % — ABNORMAL HIGH (ref 11.5–15.5)
WBC: 13.6 10*3/uL — ABNORMAL HIGH (ref 4.0–10.5)
nRBC: 0 % (ref 0.0–0.2)

## 2020-09-20 LAB — LIPASE, BLOOD: Lipase: 51 U/L (ref 11–51)

## 2020-09-20 NOTE — ED Triage Notes (Signed)
Pt has extensive hx of bowel blockages/scar tissue.  Pt has a colostomy. During the past 5 days,  Pt has redness and swelling with drainage and pus from abd wounds.  Sx worse tonight.  No vomiting  Pt reports tightness in abdomen.  Pt alert  Speech clear.

## 2020-09-21 ENCOUNTER — Ambulatory Visit
Admit: 2020-09-21 | Discharge: 2020-09-23 | Disposition: A | Discharge status: Home / Self Care | Payer: MEDICARE | Source: Other Acute Inpatient Hospital

## 2020-09-21 ENCOUNTER — Emergency Department: Payer: Medicare Other

## 2020-09-21 ENCOUNTER — Emergency Department
Admission: EM | Admit: 2020-09-21 | Discharge: 2020-09-21 | Disposition: A | Discharge status: Home / Self Care | Payer: Medicare Other | Attending: Emergency Medicine | Admitting: Emergency Medicine

## 2020-09-21 DIAGNOSIS — R1084 Generalized abdominal pain: Secondary | ICD-10-CM

## 2020-09-21 DIAGNOSIS — K56600 Partial intestinal obstruction, unspecified as to cause: Secondary | ICD-10-CM

## 2020-09-21 DIAGNOSIS — L03311 Cellulitis of abdominal wall: Secondary | ICD-10-CM | POA: Diagnosis not present

## 2020-09-21 DIAGNOSIS — K632 Fistula of intestine: Secondary | ICD-10-CM

## 2020-09-21 LAB — RESPIRATORY PANEL BY RT PCR (FLU A&B, COVID)
Influenza A by PCR: NEGATIVE
Influenza B by PCR: NEGATIVE
SARS Coronavirus 2 by RT PCR: NEGATIVE

## 2020-09-21 IMAGING — CT CT ABD-PELV W/ CM
2 of 5 series · 14 of 46 positions shown, 16 images · IV contrast (APPLIED)
Comparison: None

CLINICAL DATA: Acute nonlocalized abdominal pain. History of
Crohn's disease. Redness and swelling with drainage and poss from
abdominal wounds.

EXAM:
CT ABDOMEN AND PELVIS WITH CONTRAST
TECHNIQUE: Multidetector CT imaging of the abdomen and pelvis was performed
using the standard protocol following bolus administration of
intravenous contrast.
CONTRAST:  100mL OMNIPAQUE IOHEXOL 300 MG/ML  SOLN

[Series 2: routine abd/pel with · axial · 0.79mm/px · z∈[-485,-55]mm · 11 of 98 slices shown, 13 images]
[im 6/98  soft-tissue]
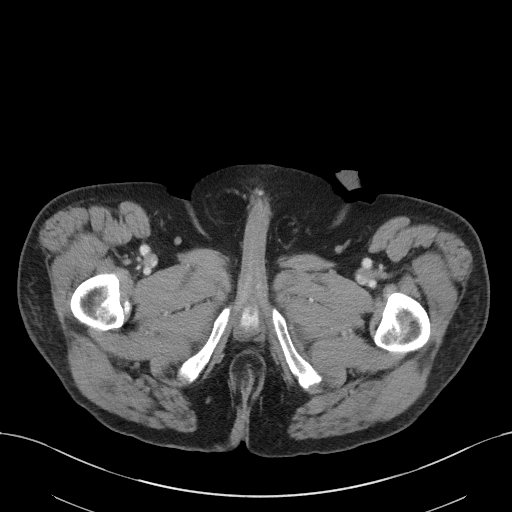
[im 6/98  bone]
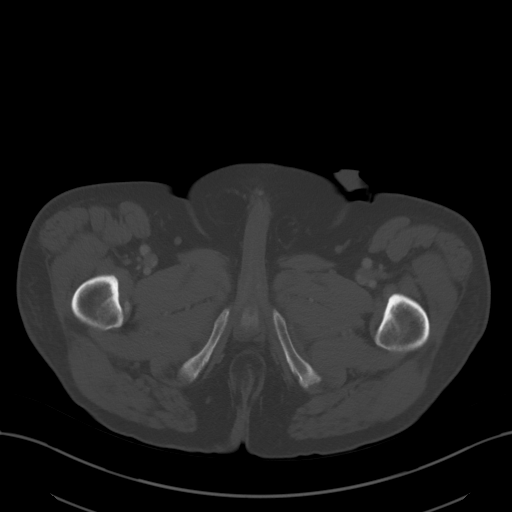
[im 16/98  soft-tissue]
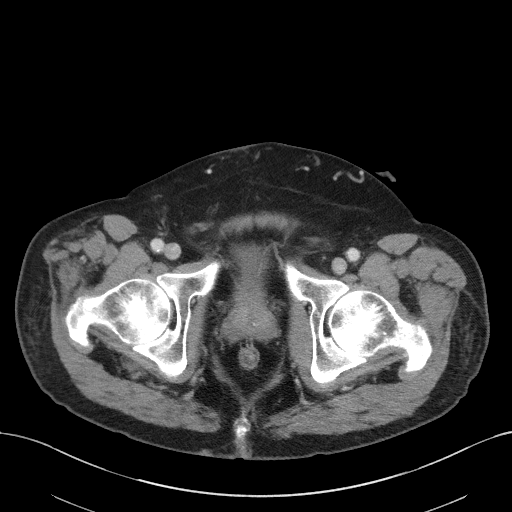
[im 26/98  soft-tissue]
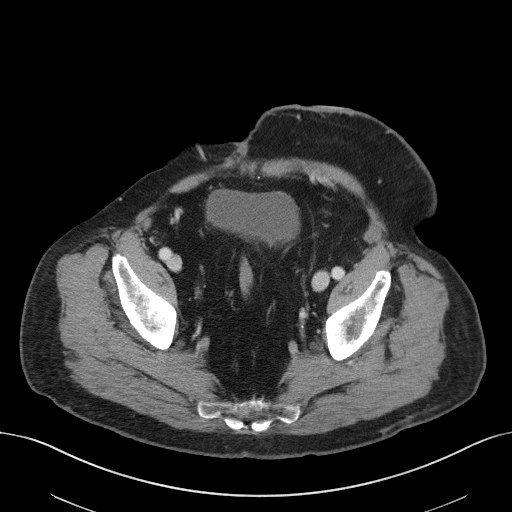
[im 31/98  soft-tissue]
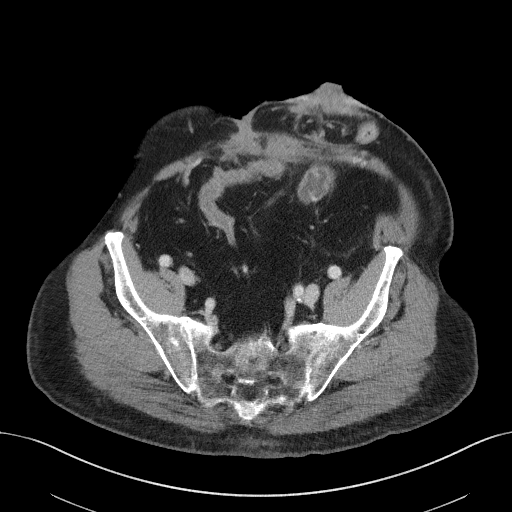
[im 41/98  soft-tissue]
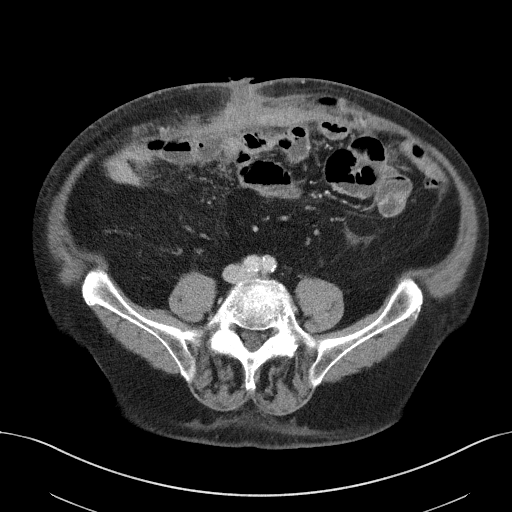
[im 52/98  soft-tissue]
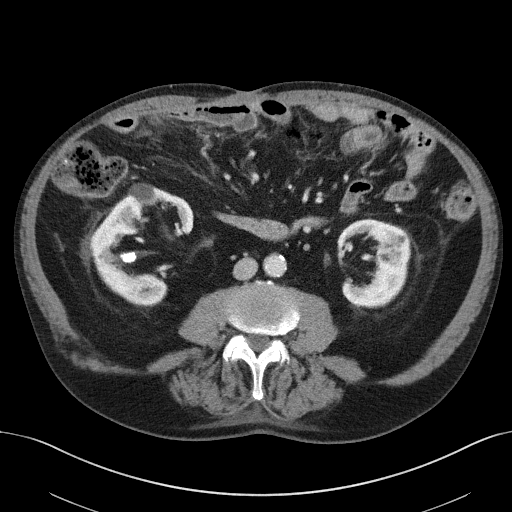
[im 57/98  soft-tissue]
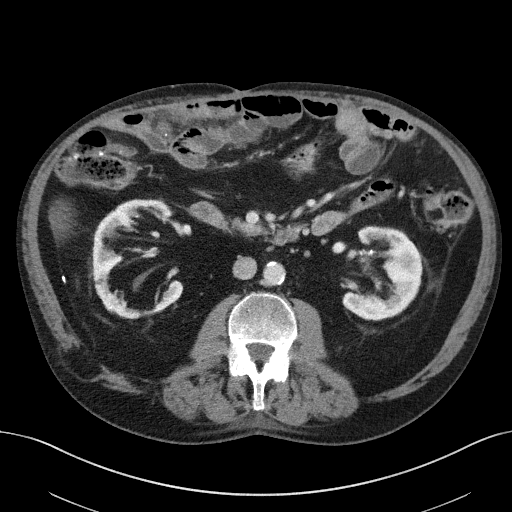
[im 67/98  soft-tissue]
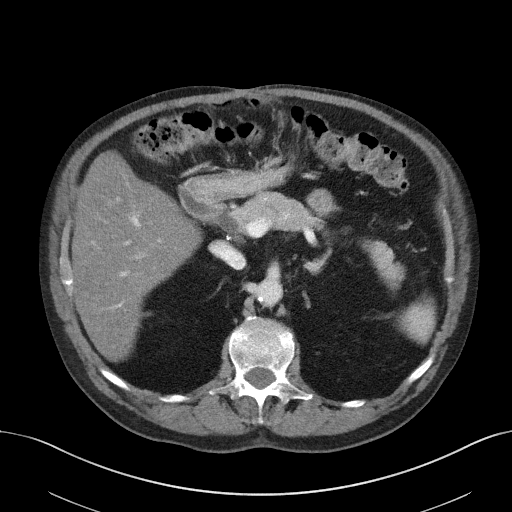
[im 72/98  soft-tissue]
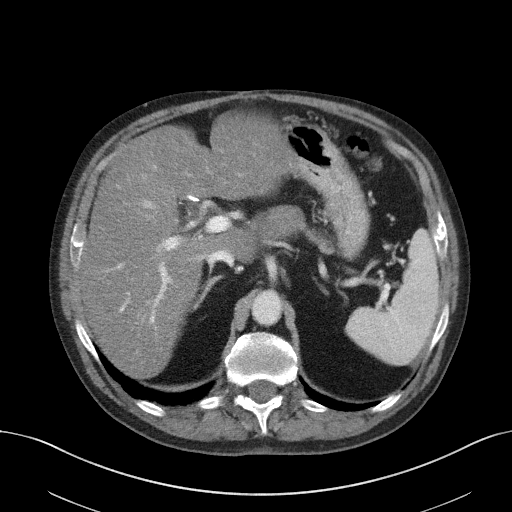
[im 72/98  bone]
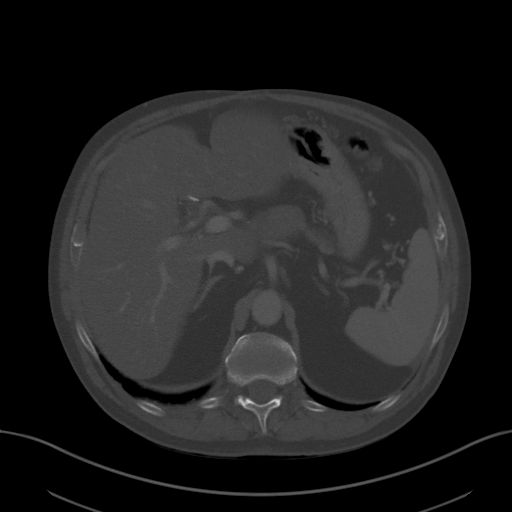
[im 82/98  soft-tissue]
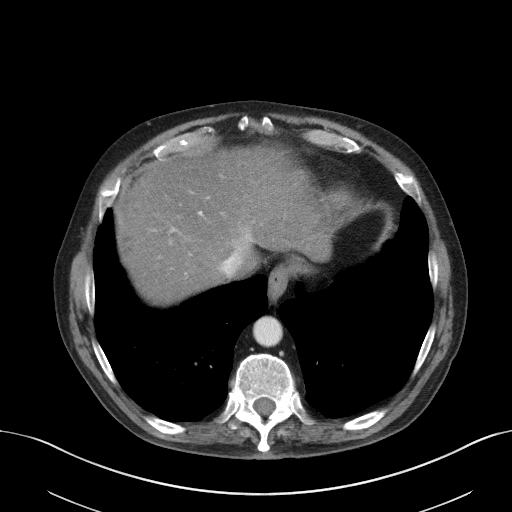
[im 92/98  soft-tissue]
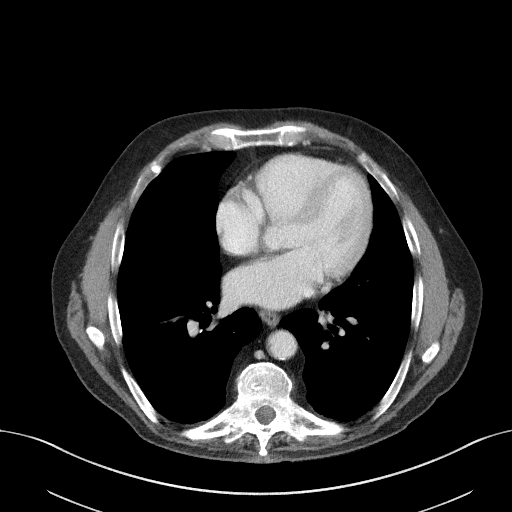

[Series 6: coronal st · coronal · 0.77mm/px · 3 of 104 slices shown]
[im 35/104  soft-tissue]
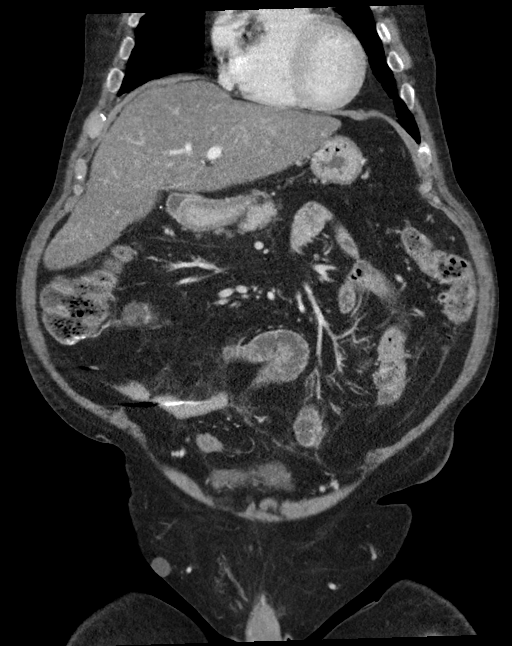
[im 46/104  soft-tissue]
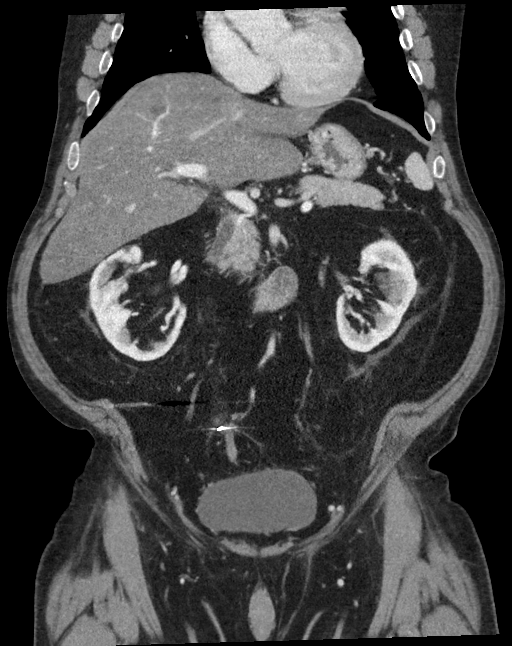
[im 58/104  soft-tissue]
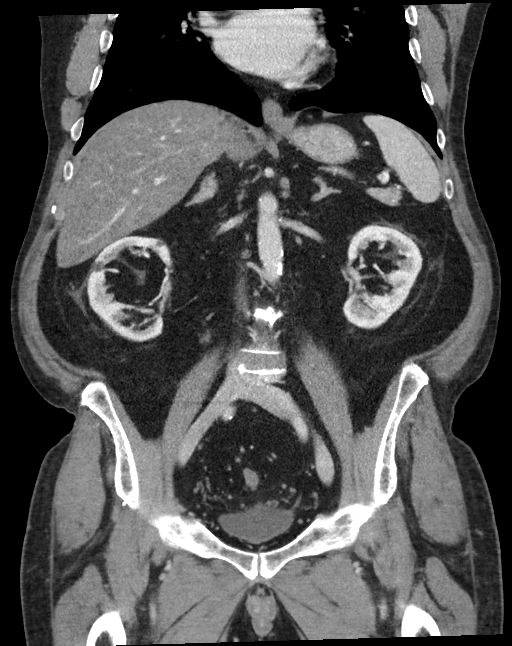

[14 of 46 positions shown; findings below may reference images not displayed]

FINDINGS: Lower chest: No acute abnormality.

Hepatobiliary: There is diffuse low attenuation within the liver.
Several small low-attenuation foci within the liver are noted
measuring up to 7 mm, image [DATE]. These are all too small to
reliably characterize. Previous cholecystectomy. Increase caliber of
the common bile duct measures up to 1.2 cm. No choledocholithiasis
identified.

Pancreas: There is a low-attenuation cystic lesion within tail of
pancreas measuring 7 mm, image [DATE]. No signs of main duct
dilatation or inflammation.

Spleen: Normal in size without focal abnormality.

Adrenals/Urinary Tract: Normal appearance of the adrenal glands.
Diffuse bilateral renal cortical thinning identified. Bilateral
nephrolithiasis noted. Stone within the inferior pole of right
kidney measures 1.3 cm. There is a fragmented stone within the upper
pole collecting system of the left kidney which measures 2.8 by
cm. Small bilateral renal cysts noted. No hydronephrosis,
hydroureter or ureteral lithiasis identified. Urinary bladder is
unremarkable.

Stomach/Bowel: Stomach is nondistended. Extensive postsurgical
changes identified including right hemicolectomy with enterocolonic
anastomosis as well as left lower quadrant in colostomy and
Hartman's pouch formation. Parastomal hernia is identified which
contains several loops of small bowel, image 62/2. There are
multiple mildly dilated loops of small bowel with air-fluid levels
are noted which, converge towards the ventral aspect of the abdomen
along the undersurface of the ventral abdominal wall likely
reflecting underlying adhesions. A transition to normal caliber neo
terminal ileum is identified within this area. Findings are
suggestive of a partial small bowel obstruction.

Vascular/Lymphatic: Aortic atherosclerosis without aneurysm. No
abdominopelvic adenopathy identified.

Reproductive: Prostate is unremarkable.

Other: Extensive postsurgical changes identified involving the
midline ventral abdominal wall. Possible sinus tract is also noted
extending from the undersurface of the ventral midline abdominal
wall into the ostomy site is suspected, image 62/2. Additionally,
there a second fistula tract is suspected extending into the ventral
midline abdominal wound.

No well-defined low-attenuation fluid collections identified to
suggest drainable abscess.

Musculoskeletal: Spondylosis identified within the lumbar spine. T8,
T9, T10 and L5 compression deformities are noted. Likely chronic. No
acute or suspicious osseous findings.
IMPRESSION: 1. Mild small bowel dilatation with air-fluid levels identified.
There is a transition to normal to decreased caliber distal small
bowel. Findings are suspicious for partial small bowel obstruction.
The transition point is likely within the ventral aspect of the
abdomen secondary to postsurgical adhesions.
2. Extensive postsurgical changes identified involving the midline
ventral abdominal wall. Possible fistula tracts are suspected
extending into the left lower quadrant ostomy site as well as the
chronic ventral midline wound. Lack of enteric contrast material
limits assessment of patency of these suspected fistulous.
3. Parastomal hernia associated with left lower quadrant colostomy
contains small bowel loops.
4. Bilateral nephrolithiasis.
5. Hepatic steatosis.
6. Multiple thoracic and lumbar compression deformities. Likely
chronic.
7. Aortic atherosclerosis.

Aortic Atherosclerosis ([id number]).

## 2020-09-21 MED ORDER — VANCOMYCIN HCL IN DEXTROSE 1-5 GM/200ML-% IV SOLN
1000.0000 mg | Freq: Once | INTRAVENOUS | Status: AC
  Administered 2020-09-21: 1000 mg via INTRAVENOUS
  Filled 2020-09-21: qty 200

## 2020-09-21 MED ORDER — SODIUM CHLORIDE 0.9 % IV SOLN
2.0000 g | Freq: Once | INTRAVENOUS | Status: AC
  Administered 2020-09-21: 2 g via INTRAVENOUS
  Filled 2020-09-21: qty 2

## 2020-09-21 MED ORDER — IOHEXOL 300 MG/ML  SOLN
100.0000 mL | Freq: Once | INTRAMUSCULAR | Status: AC | PRN
  Administered 2020-09-21: 100 mL via INTRAVENOUS

## 2020-09-21 NOTE — ED Notes (Signed)
Called [facility] transfer center to confirm receipt of EMTALA.  Kenzi at transfer center confirmed receipt.

## 2020-09-21 NOTE — ED Provider Notes (Signed)
[facility] Emergency Department Provider Note   ____________________________________________   First MD Initiated Contact with Patient 09/21/20 0820     (approximate)  I have reviewed the triage vital signs and the nursing notes.   HISTORY  Chief Complaint Abdominal Pain and Wound Infection    HPI Dean Ryan is a 79 y.o. 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  Patient states that the redness and tenderness in his abdomen has been present over the last 24 hours with this drainage that began approximately 3 hours prior to my evaluation.  Before this time, patient states that there was "a bubble that looked like it was about to pop".  Patient does relate that he has a significant past surgical history of for exploratory laparotomies as well as takedown of adhesions and colostomy placement at outside hospital.          History reviewed. No pertinent past medical history.  There are no problems to display for this patient.     Prior to Admission medications   Not on File    Allergies Patient has no known allergies.  No family history on file.  Social History Social History   Tobacco Use  . Smoking status: Not on file  Substance Use Topics  . Alcohol use: Not on file  . Drug use: Not on file    Review of Systems Constitutional: No fever/chills Eyes: No visual changes. ENT: No sore throat. Cardiovascular: Denies chest pain. Respiratory: Denies shortness of breath. Gastrointestinal: Endorses abdominal pain.  No nausea, no vomiting.  No diarrhea. Genitourinary: Negative for dysuria. Musculoskeletal: Negative for acute arthralgias Skin: Negative for rash. Neurological: Negative for headaches, weakness/numbness/paresthesias in any extremity Psychiatric:  Negative for suicidal ideation/homicidal ideation   ____________________________________________   PHYSICAL EXAM:  VITAL SIGNS: ED Triage Vitals  Enc Vitals Group     BP 09/20/20 2230 (!) 148/77     Pulse Rate 09/20/20 2230 89     Resp 09/20/20 2230 20     Temp 09/20/20 2230 98.7 F (37.1 C)     Temp Source 09/20/20 2230 Oral     SpO2 09/20/20 2230 95 %     Weight 09/20/20 2231 175 lb (79.4 kg)     Height 09/20/20 2231 5' 6"  (1.676 m)     Head Circumference --      Peak Flow --      Pain Score 09/20/20 2231 3     Pain Loc --      Pain Edu? --      Excl. in [place]? --    Constitutional: Alert and oriented. Well appearing and in no acute distress. Eyes: Conjunctivae are normal. PERRL. Head: Atraumatic. Nose: No congestion/rhinnorhea. Mouth/Throat: Mucous membranes are moist. Neck: No stridor Cardiovascular: Grossly normal heart sounds.  Good peripheral circulation. Respiratory: Normal respiratory effort.  No retractions. Gastrointestinal: Soft and nontender. No distention. Musculoskeletal: No obvious deformities Neurologic:  Normal speech and language. No gross focal neurologic deficits are appreciated. Skin:  Skin is warm and dry.  Punctate area just superior to the umbilicus in the midline abdomen with a punctate area oozing serous sanguinous fluid with surrounding erythema and induration Psychiatric: Mood and affect are normal. Speech and behavior are normal.  ____________________________________________   LABS (all labs ordered are listed, but only abnormal  results are displayed)  Labs Reviewed  COMPREHENSIVE METABOLIC PANEL - Abnormal; Notable for the following components:      Result Value   Glucose, Bld 145 (*)    Calcium 8.6 (*)    Albumin 3.4 (*)    Alkaline Phosphatase 154 (*)    All other components within normal limits  CBC - Abnormal; Notable for the following components:   WBC 13.6 (*)    Hemoglobin 12.7 (*)    HCT 36.9 (*)    RDW 15.9 (*)    All  other components within normal limits  LIPASE, BLOOD   _ RADIOLOGY  ED MD interpretation: CT with IV contrast of the abdomen and pelvis shows mild small bowel dilation with air-fluid levels identified in a transition point concerning for partial small bowel obstruction.  It also shows extensive postsurgical changes identified in the midline ventral abdominal wall with possible fistulous tracts that are suspected extending into the anterior ventral abdominal wall  Official radiology report(s): CT Abdomen Pelvis W Contrast  Result Date: 09/21/2020 CLINICAL DATA:  Acute nonlocalized abdominal pain. History of Crohn's disease. Redness and swelling with drainage and poss from abdominal wounds. EXAM: CT ABDOMEN AND PELVIS WITH CONTRAST TECHNIQUE: Multidetector CT imaging of the abdomen and pelvis was performed using the standard protocol following bolus administration of intravenous contrast. CONTRAST:  129m OMNIPAQUE IOHEXOL 300 MG/ML  SOLN COMPARISON:  None FINDINGS: Lower chest: No acute abnormality. Hepatobiliary: There is diffuse low attenuation within the liver. Several small low-attenuation foci within the liver are noted measuring up to 7 mm, image 16/2. These are all too small to reliably characterize. Previous cholecystectomy. Increase caliber of the common bile duct measures up to 1.2 cm. No choledocholithiasis identified. Pancreas: There is a low-attenuation cystic lesion within tail of pancreas measuring 7 mm, image 30/2. No signs of main duct dilatation or inflammation. Spleen: Normal in size without focal abnormality. Adrenals/Urinary Tract: Normal appearance of the adrenal glands. Diffuse bilateral renal cortical thinning identified. Bilateral nephrolithiasis noted. Stone within the inferior pole of right kidney measures 1.3 cm. There is a fragmented stone within the upper pole collecting system of the left kidney which measures 2.8 by 0.6 cm. Small bilateral renal cysts noted. No  hydronephrosis, hydroureter or ureteral lithiasis identified. Urinary bladder is unremarkable. Stomach/Bowel: Stomach is nondistended. Extensive postsurgical changes identified including right hemicolectomy with enterocolonic anastomosis as well as left lower quadrant in colostomy and Hartman's pouch formation. 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 There are multiple mildly dilated loops of small bowel with air-fluid levels are noted which, converge towards the ventral aspect of the abdomen along the undersurface of the ventral abdominal wall likely reflecting underlying adhesions. A transition to normal caliber neo terminal ileum is identified within this area. Findings are suggestive of a partial small bowel obstruction. Vascular/Lymphatic: Aortic atherosclerosis without aneurysm. No abdominopelvic adenopathy identified. Reproductive: Prostate is unremarkable. Other: Extensive postsurgical changes identified involving the midline ventral abdominal wall. Possible sinus tract is also noted extending from the undersurface of the ventral midline abdominal wall into the ostomy site is suspected, image 62/2. Additionally, there a second fistula tract is suspected extending into the ventral midline abdominal wound. No well-defined low-attenuation fluid collections identified to suggest drainable abscess. Musculoskeletal: Spondylosis identified within the lumbar spine. T8, T9, T10 and L5 compression deformities are noted. Likely chronic. No acute or suspicious osseous findings. IMPRESSION: 1. Mild small bowel dilatation with air-fluid levels identified. There is a  transition to normal to decreased caliber distal small bowel. Findings are suspicious for partial small bowel obstruction. The transition point is likely within the ventral aspect of the abdomen secondary to postsurgical adhesions. 2. Extensive postsurgical changes identified involving the midline ventral  abdominal wall. Possible fistula tracts are suspected extending into the left lower quadrant ostomy site as well as the chronic ventral midline wound. Lack of enteric contrast material limits assessment of patency of these suspected fistulous. 3. 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 4. Bilateral nephrolithiasis. 5. Hepatic steatosis. 6. 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 chronic. 7. Aortic atherosclerosis. Aortic Atherosclerosis (ICD10-I70.0). Electronically Signed   By: Kerby Moors M.D.   On: 09/21/2020 10:04    ____________________________________________   PROCEDURES  Procedure(s) performed (including Critical Care):  .1-3 Lead EKG Interpretation Performed by: Naaman Plummer, MD Authorized by: Naaman Plummer, MD     Interpretation: normal     ECG rate:  92   ECG rate assessment: normal     Rhythm: sinus rhythm     Ectopy: none     Conduction: normal       ____________________________________________   INITIAL IMPRESSION / ASSESSMENT AND PLAN / ED COURSE  As part of my medical decision making, I reviewed the following data within the [place] notes reviewed and incorporated, Labs reviewed, EKG interpreted, Old chart reviewed, Radiograph reviewed and Notes from prior ED visits reviewed and incorporated        Patient is a 79 year old male with a past medical history of Crohn's disease and left lower quadrant colostomy in place who presents for new onset abdominal drainage with surrounding erythema.  Differential diagnosis includes enterocutaneous fistula, cellulitis, small bowel obstruction, enteritis, diverticulitis.  Given history, physical exam, radiologic/laboratory evaluation, patient shows evidence of likely new fistulous tract concerning for new enteric cutaneous fistula given discharge at the abdomen.  Patient also has surrounding cellulitis and CT evidence of  partial small bowel obstruction.  Patient's nausea well controlled without nasogastric tube.  Patient was given empiric antibiotics.  I spoke to general surgery at [facility] who states that this patient needs case is too complex for our facility and recommends transfer to a tertiary care facility for further evaluation and management.  I spoke to Dr. Marla Roe in general surgery at [facility] agrees to except this patient in transfer.  Patient agrees with this plan for transfer.      ____________________________________________   FINAL CLINICAL IMPRESSION(S) / ED DIAGNOSES  Final diagnoses:  Cellulitis of abdominal wall  Partial small bowel obstruction (HCC)  Enterocutaneous fistula  Generalized abdominal pain     ED Discharge Orders    None       Note:  This document was prepared using Dragon voice recognition software and may include unintentional dictation errors.   Naaman Plummer, MD 09/21/20 [phone]

## 2020-09-21 NOTE — ED Notes (Signed)
Pt taken to [facility] hospital for admission to [id number]; report given to [place], [place].

## 2020-09-21 NOTE — ED Notes (Signed)
EMTALA faxed to [facility] transfer center.

## 2020-09-21 NOTE — ED Provider Notes (Signed)
-----------------------------------------   6:27 PM on 09/21/2020 -----------------------------------------  The patient asked to speak to me because he apparently was not certain that he wanted transfer to [facility] as had been arranged by Dr. Cheri Fowler.  He stated that [facility] would be more convenient logistically in terms of where he lives, however he would prefer to go wherever had the best possible team to handle his condition.  Although he had already been accepted to [facility] and was awaiting a bed, based on discussion with him I agreed to contact [facility] to discuss with surgery there.  I was put in touch with Dr. Dema Severin from general surgery and discussed the case with him.  He advised that based on the complexity of the patient's condition and the relatively limited colorectal surgery coverage at [facility], he would recommend transfer to [facility], or [facility].  I informed the patient of this, and he agreed to proceed with transfer to [facility].  EMS arrived shortly thereafter.  He remained in stable condition throughout this time, and was stable at the time of transfer.   Arta Silence, MD 09/21/20 [phone]

## 2020-09-21 NOTE — ED Notes (Signed)
Pt was taken to [facility] by [facility]. When EMS arrived, this nurse was unavailable, caring for another patient. Pt was taken to the ambulance before this nurse could get the signature and vitals, but we were able to get to the truck before they left and get the needed signature and vitals. Pt's departure was not delayed, and he was able to leave shortly thereafter.

## 2020-09-21 NOTE — ED Notes (Signed)
1 set of blood cultures collected

## 2020-09-21 NOTE — Consult Note (Signed)
Subjective:   CC: enteroatomospheric fistula  HPI:  Dean Ryan is a 79 y.o. male who was consulted by Cheri Fowler for evaluation of above.  First noted 1 day ago.  Increasing lump at midline incision site prompted visit to the ED.  While waiting for bed noted to have purulent discharge.  He otherwise is not complaining of any other symptoms.  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  Patient has a longstanding history of Crohn's with multiple abdominal surgeries as noted below.  Past Medical History: Includes Crohn's  Past Surgical History: Includes multiple surgeries including multiple bowel resections, parastomal hernia repair, and ostomy creation, lysis of adhesions, exploratory laparotomy.  Latest surgery was exploratory laparotomy with lysis of adhesions in June at [facility] for bowel obstruction per patient report.  Family History: Reviewed and not relevant to chief complaint  Social History:  has no history on file for tobacco use, alcohol use, and drug use.  Current Medications:  Prior to Admission medications   Medication Sig Start Date End Date Taking? Authorizing Provider  Cholecalciferol 25 MCG (1000 UT) capsule Take 1,000 Units by mouth daily.    Yes [provider]  furosemide (LASIX) 40 MG tablet Take 40 mg by mouth daily.    Yes [provider]  loratadine (CLARITIN) 10 MG tablet Take 10 mg by mouth daily.    Yes [provider]  losartan (COZAAR) 100 MG tablet Take 100 mg by mouth daily.  11/25/17  Yes [provider]  mesalamine (APRISO) 0.375 g 24 hr capsule Take 4 capsules by mouth daily.   Yes [provider]  metoprolol succinate (TOPROL-XL) 50 MG 24 hr tablet Take 1 tablet by mouth daily. 10/23/17  Yes [provider]  Multiple Vitamins-Minerals (OCUVITE-LUTEIN PO) Take 1 tablet by mouth daily.   Yes [provider]  pantoprazole (PROTONIX) 40 MG tablet Take 1 tablet  by mouth daily. 04/08/18  Yes [provider]  spironolactone (ALDACTONE) 25 MG tablet Take 1 tablet by mouth daily.   Yes [provider]    Allergies:  No Known Allergies  ROS:  General: Denies weight loss, weight gain, fatigue, fevers, chills, and night sweats. Eyes: Denies blurry vision, double vision, eye pain, itchy eyes, and tearing. Ears: Denies hearing loss, earache, and ringing in ears. Nose: Denies sinus pain, congestion, infections, runny nose, and nosebleeds. Mouth/throat: Denies hoarseness, sore throat, bleeding gums, and difficulty swallowing. Heart: Denies chest pain, palpitations, racing heart, irregular heartbeat, leg pain or swelling, and decreased activity tolerance. Respiratory: Denies breathing difficulty, shortness of breath, wheezing, cough, and sputum. GI: Denies change in appetite, heartburn, nausea, vomiting, constipation, diarrhea, and blood in stool. GU: Denies difficulty urinating, pain with urinating, urgency, frequency, blood in urine. Musculoskeletal: Denies joint stiffness, pain, swelling, muscle weakness. Skin: Denies rash, itching, mass, tumors, sores, and boils Neurologic: Denies headache, fainting, dizziness, seizures, numbness, and tingling. Psychiatric: Denies depression, anxiety, difficulty sleeping, and memory loss. Endocrine: Denies heat or cold intolerance, and increased thirst or urination. Blood/lymph: Denies easy bruising, easy bruising, and swollen glands     Objective:     BP (!) 151/62 (BP Location: Right Arm)   Pulse 63   Temp 98.2 F (36.8 C) (Oral)   Resp 18   Ht 5' 6"  (1.676 m)   Wt 79.4 kg   SpO2 98%   BMI 28.25 kg/m   Constitutional :  alert, cooperative, appears stated age and no distress  Lymphatics/Throat:  no asymmetry, masses, or scars  Respiratory:  clear to auscultation bilaterally  Cardiovascular:  regular rate and rhythm  Gastrointestinal: soft, non-tender; bowel sounds normal; no masses,  no  organomegaly.  Left lower quadrant ostomy in place with productive stool.  Pink healthy mucosa.  Palpable adjacent lump consistent with parastomal hernia noted on CT scan.  Midline scar with a pinpoint hole, no active drainage at time of exam.  The surrounding granulation tissue around the pinpoint hole is not actively bleeding.  Purulent looking output noted on dressing.  Musculoskeletal: Steady gait and movement  Skin: Cool and moist.  Psychiatric: Normal affect, non-agitated, not confused       LABS:  CMP Latest Ref Rng & Units 09/20/2020  Glucose 70 - 99 mg/dL 145(H)  BUN 8 - 23 mg/dL 17  Creatinine 0.61 - 1.24 mg/dL 1.08  Sodium 135 - 145 mmol/L 141  Potassium 3.5 - 5.1 mmol/L 3.8  Chloride 98 - 111 mmol/L 107  CO2 22 - 32 mmol/L 24  Calcium 8.9 - 10.3 mg/dL 8.6(L)  Total Protein 6.5 - 8.1 g/dL 7.4  Total Bilirubin 0.3 - 1.2 mg/dL 0.6  Alkaline Phos 38 - 126 U/L 154(H)  AST 15 - 41 U/L 32  ALT 0 - 44 U/L 25   CBC Latest Ref Rng & Units 09/20/2020  WBC 4.0 - 10.5 K/uL 13.6(H)  Hemoglobin 13.0 - 17.0 g/dL 12.7(L)  Hematocrit 39 - 52 % 36.9(L)  Platelets 150 - 400 K/uL 287    RADS: CLINICAL DATA:  Acute nonlocalized abdominal pain. History of Crohn's disease. Redness and swelling with drainage and poss from abdominal wounds.  EXAM: CT ABDOMEN AND PELVIS WITH CONTRAST  TECHNIQUE: Multidetector CT imaging of the abdomen and pelvis was performed using the standard protocol following bolus administration of intravenous contrast.  CONTRAST:  173m OMNIPAQUE IOHEXOL 300 MG/ML  SOLN  COMPARISON:  None  FINDINGS: Lower chest: No acute abnormality.  Hepatobiliary: There is diffuse low attenuation within the liver. Several small low-attenuation foci within the liver are noted measuring up to 7 mm, image 16/2. These are all too small to reliably characterize. Previous cholecystectomy. Increase caliber of the common bile duct measures up to 1.2 cm. No  choledocholithiasis identified.  Pancreas: There is a low-attenuation cystic lesion within tail of pancreas measuring 7 mm, image 30/2. No signs of main duct dilatation or inflammation.  Spleen: Normal in size without focal abnormality.  Adrenals/Urinary Tract: Normal appearance of the adrenal glands. Diffuse bilateral renal cortical thinning identified. Bilateral nephrolithiasis noted. Stone within the inferior pole of right kidney measures 1.3 cm. There is a fragmented stone within the upper pole collecting system of the left kidney which measures 2.8 by 0.6 cm. Small bilateral renal cysts noted. No hydronephrosis, hydroureter or ureteral lithiasis identified. Urinary bladder is unremarkable.  Stomach/Bowel: Stomach is nondistended. Extensive postsurgical changes identified including right hemicolectomy with enterocolonic anastomosis as well as left lower quadrant in colostomy and Hartman's pouch formation. 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 There are multiple mildly dilated loops of small bowel with air-fluid levels are noted which, converge towards the ventral aspect of the abdomen along the undersurface of the ventral abdominal wall likely reflecting underlying adhesions. A transition to normal caliber neo terminal ileum is identified within this area. Findings are suggestive of a partial small bowel obstruction.  Vascular/Lymphatic: Aortic atherosclerosis without aneurysm. No abdominopelvic adenopathy identified.  Reproductive: Prostate is unremarkable.  Other: Extensive postsurgical changes  identified involving the midline ventral abdominal wall. Possible sinus tract is also noted extending from the undersurface of the ventral midline abdominal wall into the ostomy site is suspected, image 62/2. Additionally, there a second fistula tract is suspected extending into the ventral midline abdominal wound.  No  well-defined low-attenuation fluid collections identified to suggest drainable abscess.  Musculoskeletal: Spondylosis identified within the lumbar spine. T8, T9, T10 and L5 compression deformities are noted. Likely chronic. No acute or suspicious osseous findings.  IMPRESSION: 1. Mild small bowel dilatation with air-fluid levels identified. There is a transition to normal to decreased caliber distal small bowel. Findings are suspicious for partial small bowel obstruction. The transition point is likely within the ventral aspect of the abdomen secondary to postsurgical adhesions. 2. Extensive postsurgical changes identified involving the midline ventral abdominal wall. Possible fistula tracts are suspected extending into the left lower quadrant ostomy site as well as the chronic ventral midline wound. Lack of enteric contrast material limits assessment of patency of these suspected fistulous. 3. 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 4. Bilateral nephrolithiasis. 5. Hepatic steatosis. 6. 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 chronic. 7. Aortic atherosclerosis.  Aortic Atherosclerosis (ICD10-I70.0).   Electronically Signed   By: Kerby Moors M.D.   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  Due to the gradual onset with minimal symptoms, along with last surgery being several months ago, I believe this is more likely a fistula formation rather than a postoperative abscess.   Plan:     1.  Explained to patient and wife at bedside that due to his very complicated surgical history along with Crohn's, patient will be suited to be treated at a tertiary care center with a multidisciplinary approach and advanced surgical options not available here at [facility].  Since patient is otherwise asymptomatic and stable, recommend transfer to a tertiary care center for further  management and hopefully definitive treatment.  Patient and wife verbalized understanding.

## 2020-09-25 ENCOUNTER — Ambulatory Visit: Admit: 2020-09-25 | Discharge: 2020-09-26 | Payer: MEDICARE

## 2020-09-25 DIAGNOSIS — K5080 Crohn's disease of both small and large intestine without complications: Principal | ICD-10-CM

## 2020-09-26 DIAGNOSIS — K5080 Crohn's disease of both small and large intestine without complications: Principal | ICD-10-CM

## 2020-09-26 MED ORDER — STELARA 45 MG/0.5 ML SUBCUTANEOUS SOLUTION
3 refills | 0 days | Status: CP
Start: 2020-09-26 — End: ?

## 2020-09-29 DIAGNOSIS — K5080 Crohn's disease of both small and large intestine without complications: Principal | ICD-10-CM

## 2020-10-16 ENCOUNTER — Telehealth: Payer: Self-pay | Admitting: Gastroenterology

## 2020-10-16 NOTE — Telephone Encounter (Signed)
Patient stated that he will be establishing care with [facility] and wanted to cancel 10/26/2020 appt.

## 2020-10-26 ENCOUNTER — Ambulatory Visit: Payer: Self-pay | Admitting: Gastroenterology

## 2020-10-31 ENCOUNTER — Ambulatory Visit: Admit: 2020-10-31 | Discharge: 2020-11-01 | Payer: MEDICARE

## 2020-10-31 DIAGNOSIS — K5080 Crohn's disease of both small and large intestine without complications: Principal | ICD-10-CM

## 2020-10-31 DIAGNOSIS — I509 Heart failure, unspecified: Principal | ICD-10-CM

## 2020-10-31 DIAGNOSIS — Z933 Colostomy status: Principal | ICD-10-CM

## 2020-10-31 DIAGNOSIS — K219 Gastro-esophageal reflux disease without esophagitis: Principal | ICD-10-CM

## 2020-10-31 DIAGNOSIS — G4730 Sleep apnea, unspecified: Principal | ICD-10-CM

## 2020-10-31 DIAGNOSIS — K7581 Nonalcoholic steatohepatitis (NASH): Principal | ICD-10-CM

## 2020-10-31 DIAGNOSIS — Z79899 Other long term (current) drug therapy: Principal | ICD-10-CM

## 2020-10-31 DIAGNOSIS — Z9049 Acquired absence of other specified parts of digestive tract: Principal | ICD-10-CM

## 2020-10-31 DIAGNOSIS — Z9989 Dependence on other enabling machines and devices: Principal | ICD-10-CM

## 2020-10-31 DIAGNOSIS — I110 Hypertensive heart disease with heart failure: Principal | ICD-10-CM

## 2020-10-31 DIAGNOSIS — K50013 Crohn's disease of small intestine with fistula: Principal | ICD-10-CM

## 2020-12-09 HISTORY — PX: COLOSTOMY: SHX63

## 2020-12-21 ENCOUNTER — Ambulatory Visit: Admit: 2020-12-21 | Discharge: 2020-12-22 | Payer: MEDICARE | Attending: Gastroenterology | Primary: Gastroenterology

## 2020-12-21 DIAGNOSIS — K5080 Crohn's disease of both small and large intestine without complications: Principal | ICD-10-CM

## 2020-12-21 MED ORDER — STELARA 45 MG/0.5 ML SUBCUTANEOUS SOLUTION
3 refills | 0 days | Status: CP
Start: 2020-12-21 — End: ?

## 2020-12-21 MED ORDER — METOPROLOL SUCCINATE ER 50 MG TABLET,EXTENDED RELEASE 24 HR
ORAL_TABLET | Freq: Every day | ORAL | 0 refills | 90 days | Status: CP
Start: 2020-12-21 — End: 2021-03-21

## 2020-12-27 ENCOUNTER — Encounter: Admit: 2020-12-27 | Discharge: 2020-12-29 | Disposition: A | Discharge status: Home Health | Payer: MEDICARE

## 2020-12-27 ENCOUNTER — Ambulatory Visit: Admit: 2020-12-27 | Discharge: 2020-12-29 | Disposition: A | Discharge status: Home Health | Payer: MEDICARE

## 2020-12-27 DIAGNOSIS — I5022 Chronic systolic (congestive) heart failure: Principal | ICD-10-CM

## 2020-12-27 DIAGNOSIS — K219 Gastro-esophageal reflux disease without esophagitis: Principal | ICD-10-CM

## 2020-12-27 DIAGNOSIS — K56600 Partial intestinal obstruction, unspecified as to cause: Principal | ICD-10-CM

## 2020-12-27 DIAGNOSIS — K56609 Unspecified intestinal obstruction, unspecified as to partial versus complete obstruction: Principal | ICD-10-CM

## 2020-12-27 DIAGNOSIS — Z20822 Contact with and (suspected) exposure to covid-19: Principal | ICD-10-CM

## 2020-12-27 DIAGNOSIS — Z87891 Personal history of nicotine dependence: Principal | ICD-10-CM

## 2020-12-27 DIAGNOSIS — G4730 Sleep apnea, unspecified: Principal | ICD-10-CM

## 2020-12-27 DIAGNOSIS — Z85828 Personal history of other malignant neoplasm of skin: Principal | ICD-10-CM

## 2020-12-27 DIAGNOSIS — K7581 Nonalcoholic steatohepatitis (NASH): Principal | ICD-10-CM

## 2020-12-27 DIAGNOSIS — I10 Essential (primary) hypertension: Principal | ICD-10-CM

## 2020-12-27 DIAGNOSIS — I110 Hypertensive heart disease with heart failure: Principal | ICD-10-CM

## 2020-12-27 DIAGNOSIS — Z933 Colostomy status: Principal | ICD-10-CM

## 2020-12-27 DIAGNOSIS — N179 Acute kidney failure, unspecified: Principal | ICD-10-CM

## 2020-12-28 DIAGNOSIS — K219 Gastro-esophageal reflux disease without esophagitis: Principal | ICD-10-CM

## 2020-12-28 DIAGNOSIS — Z20822 Contact with and (suspected) exposure to covid-19: Principal | ICD-10-CM

## 2020-12-28 DIAGNOSIS — Z933 Colostomy status: Principal | ICD-10-CM

## 2020-12-28 DIAGNOSIS — Z85828 Personal history of other malignant neoplasm of skin: Principal | ICD-10-CM

## 2020-12-28 DIAGNOSIS — G4730 Sleep apnea, unspecified: Principal | ICD-10-CM

## 2020-12-28 DIAGNOSIS — K56600 Partial intestinal obstruction, unspecified as to cause: Principal | ICD-10-CM

## 2020-12-28 DIAGNOSIS — I5022 Chronic systolic (congestive) heart failure: Principal | ICD-10-CM

## 2020-12-28 DIAGNOSIS — Z87891 Personal history of nicotine dependence: Principal | ICD-10-CM

## 2020-12-28 DIAGNOSIS — K7581 Nonalcoholic steatohepatitis (NASH): Principal | ICD-10-CM

## 2020-12-28 DIAGNOSIS — N179 Acute kidney failure, unspecified: Principal | ICD-10-CM

## 2020-12-28 DIAGNOSIS — I110 Hypertensive heart disease with heart failure: Principal | ICD-10-CM

## 2020-12-29 MED ORDER — LOSARTAN 100 MG TABLET
ORAL_TABLET | Freq: Every day | ORAL | 0 refills | 90 days | Status: CP
Start: 2020-12-29 — End: 2021-03-29

## 2021-02-28 ENCOUNTER — Ambulatory Visit: Admit: 2021-02-28 | Discharge: 2021-03-01 | Payer: MEDICARE

## 2021-02-28 DIAGNOSIS — Z5181 Encounter for therapeutic drug level monitoring: Principal | ICD-10-CM

## 2021-02-28 DIAGNOSIS — R739 Hyperglycemia, unspecified: Principal | ICD-10-CM

## 2021-02-28 DIAGNOSIS — Z7952 Long term (current) use of systemic steroids: Principal | ICD-10-CM

## 2021-02-28 DIAGNOSIS — K50812 Crohn's disease of both small and large intestine with intestinal obstruction: Principal | ICD-10-CM

## 2021-02-28 DIAGNOSIS — G4733 Obstructive sleep apnea (adult) (pediatric): Principal | ICD-10-CM

## 2021-02-28 DIAGNOSIS — I420 Dilated cardiomyopathy: Principal | ICD-10-CM

## 2021-03-01 ENCOUNTER — Ambulatory Visit: Admit: 2021-03-01 | Discharge: 2021-03-02 | Payer: MEDICARE

## 2021-03-07 ENCOUNTER — Ambulatory Visit: Admit: 2021-03-07 | Discharge: 2021-03-08 | Payer: MEDICARE

## 2021-04-06 MED ORDER — METOPROLOL SUCCINATE ER 50 MG TABLET,EXTENDED RELEASE 24 HR
ORAL_TABLET | Freq: Every day | ORAL | 1 refills | 90 days | Status: CP
Start: 2021-04-06 — End: 2021-07-05

## 2021-04-19 ENCOUNTER — Institutional Professional Consult (permissible substitution): Admit: 2021-04-19 | Discharge: 2021-04-20 | Payer: MEDICARE

## 2021-04-19 DIAGNOSIS — Z0000 Encounter for general adult medical examination without abnormal findings: Principal | ICD-10-CM

## 2021-05-22 ENCOUNTER — Ambulatory Visit: Admit: 2021-05-22 | Discharge: 2021-05-23 | Payer: MEDICARE

## 2021-05-22 DIAGNOSIS — G4733 Obstructive sleep apnea (adult) (pediatric): Principal | ICD-10-CM

## 2021-05-22 MED ORDER — FUROSEMIDE 40 MG TABLET
ORAL_TABLET | Freq: Every day | ORAL | 3 refills | 90 days | Status: CP
Start: 2021-05-22 — End: ?

## 2021-06-14 ENCOUNTER — Ambulatory Visit (INDEPENDENT_AMBULATORY_CARE_PROVIDER_SITE_OTHER): Payer: Medicare Other | Admitting: Dermatology

## 2021-06-14 ENCOUNTER — Other Ambulatory Visit: Payer: Self-pay

## 2021-06-14 DIAGNOSIS — Z85828 Personal history of other malignant neoplasm of skin: Secondary | ICD-10-CM

## 2021-06-14 DIAGNOSIS — L820 Inflamed seborrheic keratosis: Secondary | ICD-10-CM

## 2021-06-14 DIAGNOSIS — L570 Actinic keratosis: Secondary | ICD-10-CM

## 2021-06-14 DIAGNOSIS — L821 Other seborrheic keratosis: Secondary | ICD-10-CM

## 2021-06-14 DIAGNOSIS — Z1283 Encounter for screening for malignant neoplasm of skin: Secondary | ICD-10-CM

## 2021-06-14 DIAGNOSIS — L578 Other skin changes due to chronic exposure to nonionizing radiation: Secondary | ICD-10-CM | POA: Diagnosis not present

## 2021-06-14 DIAGNOSIS — L814 Other melanin hyperpigmentation: Secondary | ICD-10-CM

## 2021-06-14 DIAGNOSIS — D229 Melanocytic nevi, unspecified: Secondary | ICD-10-CM

## 2021-06-14 DIAGNOSIS — D1800 Hemangioma unspecified site: Secondary | ICD-10-CM

## 2021-06-14 NOTE — Progress Notes (Signed)
New Patient Visit  Subjective  Dean Ryan is a 80 y.o. male who presents for the following: Annual Exam (Mole check, Hx of BCC ). The patient presents for Total-Body Skin Exam (TBSE) for skin cancer screening and mole check.   The following portions of the chart were reviewed this encounter and updated as appropriate:   Allergies  Meds  Problems  Med Hx  Surg Hx  Fam Hx       Review of Systems:  No other skin or systemic complaints except as noted in HPI or Assessment and Plan.  Objective  Well appearing patient in no apparent distress; mood and affect are within normal limits.  A full examination was performed including scalp, head, eyes, ears, nose, lips, neck, chest, axillae, abdomen, back, buttocks, bilateral upper extremities, bilateral lower extremities, hands, feet, fingers, toes, fingernails, and toenails. All findings within normal limits unless otherwise noted below.  Right Forearm x 1, left pretibial x 2 (3) Erythematous thin papules/macules with gritty scale.   Right Knee - Anterior x (1) Erythematous keratotic or waxy stuck-on papule or plaque.    Assessment & Plan  AK (actinic keratosis) (3) Right Forearm x 1, left pretibial x 2  Hypertrophic AK   Prior to procedure, discussed risks of blister formation, small wound, skin dyspigmentation, or rare scar following cryotherapy. Recommend Vaseline ointment to treated areas while healing.  Actinic keratoses are precancerous spots that appear secondary to cumulative UV radiation exposure/sun exposure over time. They are chronic with expected duration over 1 year. A portion of actinic keratoses will progress to squamous cell carcinoma of the skin. It is not possible to reliably predict which spots will progress to skin cancer and so treatment is recommended to prevent development of skin cancer.  Recommend daily broad spectrum sunscreen SPF 30+ to sun-exposed areas, reapply every 2 hours as needed.  Recommend  staying in the shade or wearing long sleeves, sun glasses (UVA+UVB protection) and wide brim hats (4-inch brim around the entire circumference of the hat). Call for new or changing lesions.   Destruction of lesion - Right Forearm x 1, left pretibial x 2 Complexity: simple   Destruction method: cryotherapy   Informed consent: discussed and consent obtained   Timeout:  patient name, date of birth, surgical site, and procedure verified Lesion destroyed using liquid nitrogen: Yes   Region frozen until ice ball extended beyond lesion: Yes   Outcome: patient tolerated procedure well with no complications   Post-procedure details: wound care instructions given    Inflamed seborrheic keratosis Right Knee - Anterior x (1)  Prior to procedure, discussed risks of blister formation, small wound, skin dyspigmentation, or rare scar following cryotherapy. Recommend Vaseline ointment to treated areas while healing.  Favor ISK over AK   Destruction of lesion - Right Knee - Anterior x (1) Complexity: simple   Destruction method: cryotherapy   Informed consent: discussed and consent obtained   Timeout:  patient name, date of birth, surgical site, and procedure verified Lesion destroyed using liquid nitrogen: Yes   Region frozen until ice ball extended beyond lesion: Yes   Outcome: patient tolerated procedure well with no complications   Post-procedure details: wound care instructions given    Actinic Damage - Severe, confluent actinic changes with pre-cancerous actinic keratoses  - Severe, chronic, not at goal, secondary to cumulative UV radiation exposure over time - diffuse scaly erythematous macules and papules with underlying dyspigmentation - Discussed Prescription "Field Treatment" for Severe, Chronic Confluent  Actinic Changes with Pre-Cancerous Actinic Keratoses Field treatment involves treatment of an entire area of skin that has confluent Actinic Changes (Sun/ Ultraviolet light damage) and  PreCancerous Actinic Keratoses by method of PhotoDynamic Therapy (PDT) and/or prescription Topical Chemotherapy agents such as 5-fluorouracil, 5-fluorouracil/calcipotriene, and/or imiquimod.  The purpose is to decrease the number of clinically evident and subclinical PreCancerous lesions to prevent progression to development of skin cancer by chemically destroying early precancer changes that may or may not be visible.  It has been shown to reduce the risk of developing skin cancer in the treated area. As a result of treatment, redness, scaling, crusting, and open sores may occur during treatment course. One or more than one of these methods may be used and may have to be used several times to control, suppress and eliminate the PreCancerous changes. Discussed treatment course, expected reaction, and possible side effects. - Recommend daily broad spectrum sunscreen SPF 30+ to sun-exposed areas, reapply every 2 hours as needed.  - Staying in the shade or wearing long sleeves, sun glasses (UVA+UVB protection) and wide brim hats (4-inch brim around the entire circumference of the hat) are also recommended. - Call for new or changing lesions.  Plan  PDT on face  PDT on hands  PDT lateral neck and helix and as much as fits of lateral cheeks (second treatment to cheeks)  Lentigines - Scattered tan macules - Due to sun exposure - Benign-appering, observe - Recommend daily broad spectrum sunscreen SPF 30+ to sun-exposed areas, reapply every 2 hours as needed. - Call for any changes  Seborrheic Keratoses - Stuck-on, waxy, tan-brown papules and/or plaques  - Benign-appearing - Discussed benign etiology and prognosis. - Observe - Call for any changes  Melanocytic Nevi - Tan-brown and/or pink-flesh-colored symmetric macules and papules - Benign appearing on exam today - Observation - Call clinic for new or changing moles - Recommend daily use of broad spectrum spf 30+ sunscreen to sun-exposed areas.    Hemangiomas - Red papules - Discussed benign nature - Observe - Call for any changes  Actinic Damage - Chronic condition, secondary to cumulative UV/sun exposure - diffuse scaly erythematous macules with underlying dyspigmentation - Recommend daily broad spectrum sunscreen SPF 30+ to sun-exposed areas, reapply every 2 hours as needed.  - Staying in the shade or wearing long sleeves, sun glasses (UVA+UVB protection) and wide brim hats (4-inch brim around the entire circumference of the hat) are also recommended for sun protection.  - Call for new or changing lesions.  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  - Call if any new or changing lesions are noted between office visits  Skin cancer screening performed today.   Return in about 3 months (around 09/14/2021) for Aks, PDT-hands, PDT-face, PDT-neck, helix, 6 months FBSE.  I, Marye Round, CMA, am acting as scribe for Forest Gleason, MD .   Documentation: I have reviewed the above documentation for accuracy and completeness, and I agree with the above.  Forest Gleason, MD

## 2021-06-14 NOTE — Patient Instructions (Addendum)
Recommend daily broad spectrum sunscreen SPF 30+ to sun-exposed areas, reapply every 2 hours as needed. Call for new or changing lesions.  Staying in the shade or wearing long sleeves, sun glasses (UVA+UVB protection) and wide brim hats (4-inch brim around the entire circumference of the hat) are also recommended for sun protection.    If PCP and gastroenterologist have no concerns, could consider niacinamide 500 mg twice daily for skin cancer prevention and/or heliocare sun protection supplement (polypodium leukocotomas)    If you have any questions or concerns for your doctor, please call our main line at [phone] and press option 4 to reach your doctor's medical assistant. If no one answers, please leave a voicemail as directed and we will return your call as soon as possible. Messages left after 4 pm will be answered the following business day.   You may also send [place] a message via [place]. We typically respond to MyChart messages within 1-2 business days.  For prescription refills, please ask your pharmacy to contact our office. Our fax number is [phone].  If you have an urgent issue when the clinic is closed that cannot wait until the next business day, you can page your doctor at the number below.    Please note that while we do our best to be available for urgent issues outside of office hours, we are not available 24/7.   If you have an urgent issue and are unable to reach [place], you may choose to seek medical care at your doctor's office, retail clinic, urgent care center, or emergency room.  If you have a medical emergency, please immediately call 911 or go to the emergency department.  Pager Numbers  - Dr. Nehemiah Massed: [phone]  - Dr. Laurence Ferrari: [phone]  - Dr. Nicole Kindred: [phone]  In the event of inclement weather, please call our main line at [phone] for an update on the status of any delays or closures.  Dermatology Medication Tips: Please keep the boxes  that topical medications come in in order to help keep track of the instructions about where and how to use these. Pharmacies typically print the medication instructions only on the boxes and not directly on the medication tubes.   If your medication is too expensive, please contact our office at [phone] option 4 or send [place] a message through [place].   We are unable to tell what your co-pay for medications will be in advance as this is different depending on your insurance coverage. However, we may be able to find a substitute medication at lower cost or fill out paperwork to get insurance to cover a needed medication.   If a prior authorization is required to get your medication covered by your insurance company, please allow [place] 1-2 business days to complete this process.  Drug prices often vary depending on where the prescription is filled and some pharmacies may offer cheaper prices.  The website www.goodrx.com contains coupons for medications through different pharmacies. The prices here do not account for what the cost may be with help from insurance (it may be cheaper with your insurance), but the website can give you the price if you did not use any insurance.  - You can print the associated coupon and take it with your prescription to the pharmacy.  - You may also stop by our office during regular business hours and pick up a GoodRx coupon card.  - If you need your prescription sent electronically to a different pharmacy, notify our office through [facility] MyChart or by phone at [phone] option 4.

## 2021-06-18 ENCOUNTER — Ambulatory Visit: Admit: 2021-06-18 | Discharge: 2021-06-19 | Payer: MEDICARE | Attending: Gastroenterology | Primary: Gastroenterology

## 2021-06-18 DIAGNOSIS — K5080 Crohn's disease of both small and large intestine without complications: Principal | ICD-10-CM

## 2021-06-19 ENCOUNTER — Other Ambulatory Visit: Payer: Self-pay

## 2021-06-19 ENCOUNTER — Encounter: Payer: Self-pay | Admitting: Dermatology

## 2021-06-19 ENCOUNTER — Ambulatory Visit (INDEPENDENT_AMBULATORY_CARE_PROVIDER_SITE_OTHER): Payer: Medicare Other

## 2021-06-19 DIAGNOSIS — L570 Actinic keratosis: Secondary | ICD-10-CM | POA: Diagnosis not present

## 2021-06-19 MED ORDER — AMINOLEVULINIC ACID HCL 20 % EX SOLR
1.0000 "application " | Freq: Once | CUTANEOUS | Status: AC
  Administered 2021-06-19: 354 mg via TOPICAL

## 2021-06-19 NOTE — Progress Notes (Signed)
1. AK (actinic keratosis) Head - Anterior (Face)  Aminolevulinic Acid HCl 20 % SOLR 354 mg - Head - Anterior (Face)   Photodynamic therapy - Head - Anterior (Face) Procedure discussed: discussed risks, benefits, side effects. and alternatives   Prep: site scrubbed/prepped with acetone   Location:  Face Number of lesions:  Multiple Type of treatment:  Blue light Aminolevulinic Acid (see MAR for details): Levulan Number of Levulan sticks used:  1 Incubation time (minutes):  60 Number of minutes under lamp:  16 Number of seconds under lamp:  40 Cooling:  Floor fan Outcome: patient tolerated procedure well with no complications   Post-procedure details: sunscreen applied and aftercare instructions given to patient

## 2021-06-19 NOTE — Patient Instructions (Signed)
Levulan/PDT Treatment Common Side Effects  - Burning/stinging, which may be severe and last up to 24-72 hours after your treatment  - Redness, swelling and/or peeling which may last up to 4 weeks  - Scaling/crusting which may last up to 2 weeks  - Sun sensitivity (you MUST avoid sun exposure for 48-72 hours after treatment)  Care Instructions  - Okay to wash with soap and water and shampoo as normal  - If needed, you can do a cold compress (ex. Ice packs) for comfort  - If okay with your Primary Doctor, you may use analgesics such as Tylenol every 4-6 hours, not to exceed recommended dose  - You may apply Cerave Healing Ointment, Vaseline or Aquaphor  - If you have a lot of swelling you may take a Benadryl to help with this (this may cause drowsiness)  Sun Precautions  - Wear a wide brim hat for the next week if outside  - Wear a sunblock with zinc or titanium dioxide at least SPF 50 daily   We will recheck you in 10-12 weeks. If any problems, please call the office and ask to speak with a nurse.

## 2021-07-03 ENCOUNTER — Ambulatory Visit (INDEPENDENT_AMBULATORY_CARE_PROVIDER_SITE_OTHER): Payer: Medicare Other

## 2021-07-03 ENCOUNTER — Other Ambulatory Visit: Payer: Self-pay

## 2021-07-03 DIAGNOSIS — L570 Actinic keratosis: Secondary | ICD-10-CM | POA: Diagnosis not present

## 2021-07-03 MED ORDER — AMINOLEVULINIC ACID HCL 20 % EX SOLR
1.0000 "application " | Freq: Once | CUTANEOUS | Status: AC
  Administered 2021-07-03: 354 mg via TOPICAL

## 2021-07-03 NOTE — Patient Instructions (Signed)
Levulan/PDT Treatment Common Side Effects  - Burning/stinging, which may be severe and last up to 24-72 hours after your treatment  - Redness, swelling and/or peeling which may last up to 4 weeks  - Scaling/crusting which may last up to 2 weeks  - Sun sensitivity (you MUST avoid sun exposure for 48-72 hours after treatment)  Care Instructions  - Okay to wash with soap and water and shampoo as normal  - If needed, you can do a cold compress (ex. Ice packs) for comfort  - If okay with your Primary Doctor, you may use analgesics such as Tylenol every 4-6 hours, not to exceed recommended dose  - You may apply Cerave Healing Ointment, Vaseline or Aquaphor  - If you have a lot of swelling you may take a Benadryl to help with this (this may cause drowsiness)  Sun Precautions  - Wear a wide brim hat for the next week if outside  - Wear a sunblock with zinc or titanium dioxide at least SPF 50 daily   We will recheck you in 10-12 weeks. If any problems, please call the office and ask to speak with a nurse.

## 2021-07-03 NOTE — Progress Notes (Signed)
Patient completed PDT therapy today.  1. AK (actinic keratosis) (2) Left Dorsal Hand; Right Dorsal Hand  Photodynamic therapy - Left Dorsal Hand, Right Dorsal Hand Procedure discussed: discussed risks, benefits, side effects. and alternatives   Prep: site scrubbed/prepped with acetone   Location:  Hands only Number of lesions:  Multiple Type of treatment:  Blue light Aminolevulinic Acid (see MAR for details): Levulan Number of Levulan sticks used:  1 Incubation time (minutes):  120 Number of minutes under lamp:  16 Number of seconds under lamp:  40 Cooling:  Floor fan Outcome: patient tolerated procedure well with no complications   Post-procedure details: sunscreen applied    Aminolevulinic Acid HCl 20 % SOLR 354 mg - Left Dorsal Hand, Right Dorsal Hand

## 2021-07-04 ENCOUNTER — Ambulatory Visit: Admit: 2021-07-04 | Discharge: 2021-07-05 | Payer: MEDICARE

## 2021-07-04 DIAGNOSIS — I10 Essential (primary) hypertension: Principal | ICD-10-CM

## 2021-07-04 DIAGNOSIS — R053 Chronic cough: Principal | ICD-10-CM

## 2021-07-04 DIAGNOSIS — Z8739 Personal history of other diseases of the musculoskeletal system and connective tissue: Principal | ICD-10-CM

## 2021-07-04 DIAGNOSIS — R5383 Other fatigue: Principal | ICD-10-CM

## 2021-07-04 DIAGNOSIS — M810 Age-related osteoporosis without current pathological fracture: Principal | ICD-10-CM

## 2021-07-04 DIAGNOSIS — R739 Hyperglycemia, unspecified: Principal | ICD-10-CM

## 2021-07-04 DIAGNOSIS — R600 Localized edema: Principal | ICD-10-CM

## 2021-07-04 MED ORDER — ZOLEDRONIC ACID 5 MG/100 ML IN MANNITOL 5 %-WATER INTRAVENOUS PIGGYBCK
Freq: Once | INTRAVENOUS | 0 refills | 1 days
Start: 2021-07-04 — End: 2021-07-04

## 2021-07-13 MED ORDER — LOSARTAN 100 MG TABLET
ORAL_TABLET | Freq: Every day | ORAL | 3 refills | 90 days | Status: CP
Start: 2021-07-13 — End: 2021-10-11

## 2021-07-17 ENCOUNTER — Ambulatory Visit (INDEPENDENT_AMBULATORY_CARE_PROVIDER_SITE_OTHER): Payer: Medicare Other

## 2021-07-17 ENCOUNTER — Other Ambulatory Visit: Payer: Self-pay

## 2021-07-17 DIAGNOSIS — L570 Actinic keratosis: Secondary | ICD-10-CM

## 2021-07-17 MED ORDER — AMINOLEVULINIC ACID HCL 20 % EX SOLR
1.0000 "application " | Freq: Once | CUTANEOUS | Status: AC
  Administered 2021-07-17: 354 mg via TOPICAL

## 2021-07-17 NOTE — Progress Notes (Signed)
1. AK (actinic keratosis) neck and ears  Photodynamic therapy - neck and ears Procedure discussed: discussed risks, benefits, side effects. and alternatives   Prep: site scrubbed/prepped with acetone   Location:  Neck and ears Number of lesions:  Multiple Type of treatment:  Blue light Aminolevulinic Acid (see MAR for details): Levulan Number of Levulan sticks used:  1 Incubation time (minutes):  60 Number of minutes under lamp:  16 Number of seconds under lamp:  40 Cooling:  Floor fan Outcome: patient tolerated procedure well with no complications   Post-procedure details: sunscreen applied and aftercare instructions given to patient    Aminolevulinic Acid HCl 20 % SOLR 354 mg - neck and ears

## 2021-07-17 NOTE — Patient Instructions (Signed)
Levulan/PDT Treatment Common Side Effects  - Burning/stinging, which may be severe and last up to 24-72 hours after your treatment  - Redness, swelling and/or peeling which may last up to 4 weeks  - Scaling/crusting which may last up to 2 weeks  - Sun sensitivity (you MUST avoid sun exposure for 48-72 hours after treatment)  Care Instructions  - Okay to wash with soap and water and shampoo as normal  - If needed, you can do a cold compress (ex. Ice packs) for comfort  - If okay with your Primary Doctor, you may use analgesics such as Tylenol every 4-6 hours, not to exceed recommended dose  - You may apply Cerave Healing Ointment, Vaseline or Aquaphor  - If you have a lot of swelling you may take a Benadryl to help with this (this may cause drowsiness)  Sun Precautions  - Wear a wide brim hat for the next week if outside  - Wear a sunblock with zinc or titanium dioxide at least SPF 50 daily   We will recheck you in 10-12 weeks. If any problems, please call the office and ask to speak with a nurse.

## 2021-07-19 ENCOUNTER — Ambulatory Visit: Admit: 2021-07-19 | Discharge: 2021-07-20 | Payer: MEDICARE

## 2021-09-13 ENCOUNTER — Other Ambulatory Visit: Payer: Self-pay

## 2021-09-13 ENCOUNTER — Ambulatory Visit (INDEPENDENT_AMBULATORY_CARE_PROVIDER_SITE_OTHER): Payer: Medicare Other | Admitting: Dermatology

## 2021-09-13 DIAGNOSIS — C4492 Squamous cell carcinoma of skin, unspecified: Secondary | ICD-10-CM

## 2021-09-13 DIAGNOSIS — C44329 Squamous cell carcinoma of skin of other parts of face: Secondary | ICD-10-CM | POA: Diagnosis not present

## 2021-09-13 DIAGNOSIS — D492 Neoplasm of unspecified behavior of bone, soft tissue, and skin: Secondary | ICD-10-CM

## 2021-09-13 DIAGNOSIS — L814 Other melanin hyperpigmentation: Secondary | ICD-10-CM

## 2021-09-13 DIAGNOSIS — C44229 Squamous cell carcinoma of skin of left ear and external auricular canal: Secondary | ICD-10-CM | POA: Diagnosis not present

## 2021-09-13 DIAGNOSIS — L821 Other seborrheic keratosis: Secondary | ICD-10-CM

## 2021-09-13 DIAGNOSIS — L578 Other skin changes due to chronic exposure to nonionizing radiation: Secondary | ICD-10-CM

## 2021-09-13 DIAGNOSIS — L739 Follicular disorder, unspecified: Secondary | ICD-10-CM | POA: Diagnosis not present

## 2021-09-13 DIAGNOSIS — L570 Actinic keratosis: Secondary | ICD-10-CM

## 2021-09-13 HISTORY — DX: Squamous cell carcinoma of skin, unspecified: C44.92

## 2021-09-13 NOTE — Patient Instructions (Addendum)
If you have any questions or concerns for your doctor, please call our main line at [phone] and press option 4 to reach your doctor's medical assistant. If no one answers, please leave a voicemail as directed and we will return your call as soon as possible. Messages left after 4 pm will be answered the following business day.   You may also send [place] a message via [place]. We typically respond to MyChart messages within 1-2 business days.  For prescription refills, please ask your pharmacy to contact our office. Our fax number is [phone].  If you have an urgent issue when the clinic is closed that cannot wait until the next business day, you can page your doctor at the number below.    Please note that while we do our best to be available for urgent issues outside of office hours, we are not available 24/7.   If you have an urgent issue and are unable to reach [place], you may choose to seek medical care at your doctor's office, retail clinic, urgent care center, or emergency room.  If you have a medical emergency, please immediately call 911 or go to the emergency department.  Pager Numbers  - Dr. Nehemiah Massed: [phone]  - Dr. Laurence Ferrari: [phone]  - Dr. Nicole Kindred: [phone]  In the event of inclement weather, please call our main line at [phone] for an update on the status of any delays or closures.  Dermatology Medication Tips: Please keep the boxes that topical medications come in in order to help keep track of the instructions about where and how to use these. Pharmacies typically print the medication instructions only on the boxes and not directly on the medication tubes.   If your medication is too expensive, please contact our office at [phone] option 4 or send [place] a message through [place].   We are unable to tell what your co-pay for medications will be in advance as this is different depending on your insurance coverage. However, we may be able to find a substitute  medication at lower cost or fill out paperwork to get insurance to cover a needed medication.   If a prior authorization is required to get your medication covered by your insurance company, please allow [place] 1-2 business days to complete this process.  Drug prices often vary depending on where the prescription is filled and some pharmacies may offer cheaper prices.  The website www.goodrx.com contains coupons for medications through different pharmacies. The prices here do not account for what the cost may be with help from insurance (it may be cheaper with your insurance), but the website can give you the price if you did not use any insurance.  - You can print the associated coupon and take it with your prescription to the pharmacy.  - You may also stop by our office during regular business hours and pick up a GoodRx coupon card.  - If you need your prescription sent electronically to a different pharmacy, notify our office through [facility] or by phone at [phone] option 4.   Instructions for [facility] Medications  One or more of your medications was sent to the [facility] mail order compounding pharmacy. You will receive an email from them and can purchase the medicine through that link. It will then be mailed to your home at the address you confirmed. If for any reason you do not receive an email from them, please check your spam folder. If you still do not find the  email, please let us know. [facility] phone number is [phone].    - Start 5-fluorouracil/calcipotriene cream twice a day for 7 days to affected areas including neck, arms and hands.  For the face you will use the cream twice a day for 4 days. Prescription sent to [facility]. Patient advised they will receive an email to purchase the medication online and have it sent to their home. Patient provided with handout reviewing treatment course and side effects and advised to call  or message [place] on MyChart with any concerns.   CLN Sports wash for daily for the back  Wound Care Instructions  Cleanse wound gently with soap and water once a day then pat dry with clean gauze. Apply a thing coat of Petrolatum (petroleum jelly, "Vaseline") over the wound (unless you have an allergy to this). We recommend that you use a new, sterile tube of Vaseline. Do not pick or remove scabs. Do not remove the yellow or white "healing tissue" from the base of the wound.  Cover the wound with fresh, clean, nonstick gauze and secure with paper tape. You may use Band-Aids in place of gauze and tape if the would is small enough, but would recommend trimming much of the tape off as there is often too much. Sometimes Band-Aids can irritate the skin.  You should call the office for your biopsy report after 1 week if you have not already been contacted.  If you experience any problems, such as abnormal amounts of bleeding, swelling, significant bruising, significant pain, or evidence of infection, please call the office immediately.  FOR ADULT SURGERY PATIENTS: If you need something for pain relief you may take 1 extra strength Tylenol (acetaminophen) AND 2 Ibuprofen (241m each) together every 4 hours as needed for pain. (do not take these if you are allergic to them or if you have a reason you should not take them.) Typically, you may only need pain medication for 1 to 3 days.

## 2021-09-13 NOTE — Progress Notes (Signed)
Follow-Up Visit   Subjective  Dean Ryan is a 80 y.o. male who presents for the following: Actinic Keratosis. Two growing spots at right cheek and left ear  The following portions of the chart were reviewed this encounter and updated as appropriate:   Allergies  Meds  Problems  Med Hx  Surg Hx  Fam Hx      Review of Systems:  No other skin or systemic complaints except as noted in HPI or Assessment and Plan.  Objective  Well appearing patient in no apparent distress; mood and affect are within normal limits.  A focused examination was performed including face, ears, neck, arms, hands, back. Relevant physical exam findings are noted in the Assessment and Plan.  L helix 0.7cm eroded pink pap     R cheek 0.5cm firm pink pap     back Follicular based papules at back  bil arms, ears Hyperkeratotic pink scaly macules arms, ears   Assessment & Plan   Seborrheic Keratoses - Stuck-on, waxy, tan-brown papules and/or plaques  - Benign-appearing - Discussed benign etiology and prognosis. - Observe - Call for any changes  Lentigines - Scattered tan macules - Due to sun exposure - Benign-appering, observe - Recommend daily broad spectrum sunscreen SPF 30+ to sun-exposed areas, reapply every 2 hours as needed. - Call for any changes  Actinic Damage - Severe, confluent actinic changes with pre-cancerous actinic keratoses  - Severe, chronic, not at goal, secondary to cumulative UV radiation exposure over time - diffuse scaly erythematous macules and papules with underlying dyspigmentation - Discussed Prescription "Field Treatment" for Severe, Chronic Confluent Actinic Changes with Pre-Cancerous Actinic Keratoses Field treatment involves treatment of an entire area of skin that has confluent Actinic Changes (Sun/ Ultraviolet light damage) and PreCancerous Actinic Keratoses by method of PhotoDynamic Therapy (PDT) and/or prescription Topical Chemotherapy agents such  as 5-fluorouracil, 5-fluorouracil/calcipotriene, and/or imiquimod.  The purpose is to decrease the number of clinically evident and subclinical PreCancerous lesions to prevent progression to development of skin cancer by chemically destroying early precancer changes that may or may not be visible.  It has been shown to reduce the risk of developing skin cancer in the treated area. As a result of treatment, redness, scaling, crusting, and open sores may occur during treatment course. One or more than one of these methods may be used and may have to be used several times to control, suppress and eliminate the PreCancerous changes. Discussed treatment course, expected reaction, and possible side effects. - Recommend daily broad spectrum sunscreen SPF 30+ to sun-exposed areas, reapply every 2 hours as needed.  - Staying in the shade or wearing long sleeves, sun glasses (UVA+UVB protection) and wide brim hats (4-inch brim around the entire circumference of the hat) are also recommended. - Call for new or changing lesions.  Pt prefers 5FU/calcipotriene to PDT - Start 5-fluorouracil/calcipotriene cream twice a day for 7 days to affected areas including neck, arms/hand.  For the face use the cream bid for 7 days.  Patient advised to treat one area at a time.  Prescription sent to [facility]. Patient advised they will receive an email to purchase the medication online and have it sent to their home. Patient provided with handout reviewing treatment course and side effects and advised to call or message [place] on MyChart with any concerns.   Neoplasm of skin (2) L helix  Skin / nail biopsy Type of biopsy: tangential   Informed consent: discussed and consent  obtained   Timeout: patient name, date of birth, surgical site, and procedure verified   Procedure prep:  Patient was prepped and draped in usual sterile fashion Prep type:  Isopropyl alcohol Anesthesia: the lesion was anesthetized in  a standard fashion   Anesthetic:  1% lidocaine w/ epinephrine 1-100,000 buffered w/ 8.4% NaHCO3 Instrument used: DermaBlade   Outcome: patient tolerated procedure well   Post-procedure details: sterile dressing applied and wound care instructions given   Dressing type: bandage and bacitracin    Specimen 1 - Surgical pathology Differential Diagnosis: D48.5 R/O SCC, BCC  Check Margins: No 0.7cm eroded pink pap  R cheek  Skin / nail biopsy Type of biopsy: punch   Informed consent: discussed and consent obtained   Anesthesia: the lesion was anesthetized in a standard fashion   Anesthesia comment:  Area prepped with alcohol Anesthetic:  1% lidocaine w/ epinephrine 1-100,000 buffered w/ 8.4% NaHCO3 Punch size:  3 mm Suture size:  5-0 Suture type: nylon   Hemostasis achieved with: suture and pressure   Outcome: patient tolerated procedure well   Post-procedure details: wound care instructions given   Post-procedure details comment:  Ointment and small bandage applied  Specimen 2 - Surgical pathology Differential Diagnosis: D48.5 Cyst r/o BCC  Check Margins: No 0.5cm firm pink pap  Folliculitis back  Recommend CLN Sports wash qd to back, sample given  Hypertrophic actinic keratosis bil arms, ears  Plan LN2 on f/u   Return in about 1 week (around 09/20/2021) for bx f/u and , suture removal, LN2 to Hypertrophic AKs arms/ears, Medical Release.  I, Othelia Pulling, RMA, am acting as scribe for Forest Gleason, MD .  Documentation: I have reviewed the above documentation for accuracy and completeness, and I agree with the above.  Forest Gleason, MD

## 2021-09-17 ENCOUNTER — Telehealth: Payer: Self-pay

## 2021-09-17 MED ORDER — METOPROLOL SUCCINATE ER 50 MG TABLET,EXTENDED RELEASE 24 HR
ORAL_TABLET | 1 refills | 0 days | Status: CP
Start: 2021-09-17 — End: ?

## 2021-09-17 NOTE — Telephone Encounter (Signed)
Pt called and left vm stating that he has a conflict on Thursday when he is supposed to return for a suture removal. He states that his wife is a [employer] scientist and wants to know if she would be able to take them out.

## 2021-09-18 ENCOUNTER — Other Ambulatory Visit: Payer: Self-pay

## 2021-09-18 ENCOUNTER — Encounter: Payer: Self-pay | Admitting: Dermatology

## 2021-09-18 DIAGNOSIS — C4492 Squamous cell carcinoma of skin, unspecified: Secondary | ICD-10-CM

## 2021-09-18 NOTE — Telephone Encounter (Signed)
Spoke with pt. Wife will remove sutures. Rescheduled appt for 10/09/21 to treat AKs.

## 2021-09-18 NOTE — Telephone Encounter (Signed)
Yes, as long as she is comfortable removing them. Thank you!

## 2021-09-20 ENCOUNTER — Ambulatory Visit: Payer: Medicare Other | Admitting: Dermatology

## 2021-09-25 MED ORDER — PANTOPRAZOLE 40 MG TABLET,DELAYED RELEASE
ORAL_TABLET | Freq: Every day | ORAL | 2 refills | 90 days | Status: CP
Start: 2021-09-25 — End: ?

## 2021-10-09 ENCOUNTER — Ambulatory Visit (INDEPENDENT_AMBULATORY_CARE_PROVIDER_SITE_OTHER): Payer: Medicare Other | Admitting: Dermatology

## 2021-10-09 ENCOUNTER — Other Ambulatory Visit: Payer: Self-pay

## 2021-10-09 ENCOUNTER — Encounter: Payer: Self-pay | Admitting: Dermatology

## 2021-10-09 DIAGNOSIS — C44329 Squamous cell carcinoma of skin of other parts of face: Secondary | ICD-10-CM | POA: Diagnosis not present

## 2021-10-09 DIAGNOSIS — C4492 Squamous cell carcinoma of skin, unspecified: Secondary | ICD-10-CM

## 2021-10-09 DIAGNOSIS — L989 Disorder of the skin and subcutaneous tissue, unspecified: Secondary | ICD-10-CM

## 2021-10-09 DIAGNOSIS — L570 Actinic keratosis: Secondary | ICD-10-CM

## 2021-10-09 DIAGNOSIS — L578 Other skin changes due to chronic exposure to nonionizing radiation: Secondary | ICD-10-CM | POA: Diagnosis not present

## 2021-10-09 DIAGNOSIS — L988 Other specified disorders of the skin and subcutaneous tissue: Secondary | ICD-10-CM

## 2021-10-09 DIAGNOSIS — L820 Inflamed seborrheic keratosis: Secondary | ICD-10-CM

## 2021-10-09 NOTE — Progress Notes (Addendum)
Follow-Up Visit   Subjective  Dean Ryan is a 80 y.o. male who presents for the following: Actinic Keratosis (1 month f/u hypertrophic Aks the hands and arms, treating with 5FU/Calcipotriene cream x 7 days, finished yesterday ). Pt c/o irritated growth on the right neck will not go away.   The following portions of the chart were reviewed this encounter and updated as appropriate:   Allergies  Meds  Problems  Med Hx  Surg Hx  Fam Hx      Review of Systems:  No other skin or systemic complaints except as noted in HPI or Assessment and Plan.  Objective  Well appearing patient in no apparent distress; mood and affect are within normal limits.  A focused examination was performed including face.arms,hands,ears. Relevant physical exam findings are noted in the Assessment and Plan.  right arm x1, right ear x 1, left inferior helix x 2, left post neck x 1, left postauricular x 1, vertex scalp x 5, right earlobe x 1 (12) Erythematous thin papules/macules with gritty scale.   right neck  (1) Erythematous keratotic or waxy stuck-on papule or plaque.   right cheek Healing pink scar   left helix Pink scaly papule  bilateral arms Multiple erosions and scaly erythematous papules and thin plaques    Assessment & Plan  AK (actinic keratosis) (12) right arm x1, right ear x 1, left inferior helix x 2, left post neck x 1, left postauricular x 1, vertex scalp x 5, right earlobe x 1  Hypertrophic Actinic keratoses are precancerous spots that appear secondary to cumulative UV radiation exposure/sun exposure over time. They are chronic with expected duration over 1 year. A portion of actinic keratoses will progress to squamous cell carcinoma of the skin. It is not possible to reliably predict which spots will progress to skin cancer and so treatment is recommended to prevent development of skin cancer.  Recommend daily broad spectrum sunscreen SPF 30+ to sun-exposed areas, reapply every  2 hours as needed.  Recommend staying in the shade or wearing long sleeves, sun glasses (UVA+UVB protection) and wide brim hats (4-inch brim around the entire circumference of the hat). Call for new or changing lesions.  Prior to procedure, discussed risks of blister formation, small wound, skin dyspigmentation, or rare scar following cryotherapy. Recommend Vaseline ointment to treated areas while healing.   Destruction of lesion - right arm x1, right ear x 1, left inferior helix x 2, left post neck x 1, left postauricular x 1, vertex scalp x 5, right earlobe x 1 Complexity: simple   Destruction method: electrodesiccation and curettage   Informed consent: discussed and consent obtained   Timeout:  patient name, date of birth, surgical site, and procedure verified Anesthesia: the lesion was anesthetized in a standard fashion   Anesthetic:  1% lidocaine w/ epinephrine 1-100,000 buffered w/ 8.4% NaHCO3 Curettage performed in three different directions: Yes   Electrodesiccation performed over the curetted area: Yes   Curettage cycles:  3 Hemostasis achieved with:  electrodesiccation Outcome: patient tolerated procedure well with no complications   Post-procedure details: sterile dressing applied and wound care instructions given   Dressing type: petrolatum    Inflamed seborrheic keratosis right neck  (1)  Symptomatic and patient desires treatment  Prior to procedure, discussed risks of blister formation, small wound, skin dyspigmentation, or rare scar following cryotherapy. Recommend Vaseline ointment to treated areas while healing.   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   Keep scheduled Mohs appointment at the skin surgery center in December to have theses 2 areas treated   Skin erosion bilateral arms  Severe actinic damage s/p 5FU calcipotriene with good response  D/c 5FU/calcipotriene and use otc Vaseline daily to BID   Will recheck for residual damage in January or February   Actinic Damage - Severe, confluent actinic changes with pre-cancerous actinic keratoses  - Severe, chronic, not at goal, secondary to cumulative UV radiation exposure over time - diffuse scaly erythematous macules and papules with underlying dyspigmentation - Discussed Prescription "Field Treatment" for Severe, Chronic Confluent Actinic Changes with Pre-Cancerous Actinic Keratoses - Field treatment involves treatment of an entire area of skin that has confluent Actinic Changes (Sun/ Ultraviolet light damage) and PreCancerous Actinic Keratoses by method of PhotoDynamic Therapy (PDT) and/or prescription Topical Chemotherapy agents such as 5-fluorouracil, 5-fluorouracil/calcipotriene, and/or imiquimod.  The purpose is to decrease the number of clinically evident and subclinical PreCancerous lesions to prevent progression to development of skin cancer by chemically destroying early precancer changes that may or may not be visible.  It has been shown to reduce the risk of developing skin cancer in the treated area. As a result of treatment, redness, scaling, crusting, and open sores may occur during treatment course. One or more than one of these methods may be used and may have to be used several times to control, suppress and eliminate the PreCancerous changes. Discussed treatment course, expected reaction, and possible side effects. - Recommend daily broad spectrum sunscreen SPF 30+ to sun-exposed areas, reapply every 2 hours as needed.  - Staying in the shade or wearing long sleeves, sun glasses (UVA+UVB protection)  and wide brim hats (4-inch brim around the entire circumference of the hat) are also recommended. - Call for new or changing lesions.  - Plan on 5FU/Calcipotriene cream to lower lip x 4 days and face x 7 days in Janurary 2023 after healed from Mohs surgery and Mohs surgeon says ok to treat  Return in about 12 weeks (around 01/01/2022) for treatment of  AKs on the face, lower lip, recheck skin cancer  .  I, Marye Round, CMA, am acting as scribe for Forest Gleason, MD .   Documentation: I have reviewed the above documentation for accuracy and completeness, and I agree with the above.  Forest Gleason, MD

## 2021-10-09 NOTE — Patient Instructions (Addendum)
Cryotherapy Aftercare  Wash gently with soap and water everyday.   Apply Vaseline and Band-Aid daily until healed.  Prior to procedure, discussed risks of blister formation, small wound, skin dyspigmentation, or rare scar following cryotherapy. Recommend Vaseline ointment to treated areas while healing.    If you have any questions or concerns for your doctor, please call our main line at [phone] and press option 4 to reach your doctor's medical assistant. If no one answers, please leave a voicemail as directed and we will return your call as soon as possible. Messages left after 4 pm will be answered the following business day.   You may also send [place] a message via [place]. We typically respond to MyChart messages within 1-2 business days.  For prescription refills, please ask your pharmacy to contact our office. Our fax number is [phone].  If you have an urgent issue when the clinic is closed that cannot wait until the next business day, you can page your doctor at the number below.    Please note that while we do our best to be available for urgent issues outside of office hours, we are not available 24/7.   If you have an urgent issue and are unable to reach [place], you may choose to seek medical care at your doctor's office, retail clinic, urgent care center, or emergency room.  If you have a medical emergency, please immediately call 911 or go to the emergency department.  Pager Numbers  - Dr. Nehemiah Massed: [phone]  - Dr. Laurence Ferrari: [phone]  - Dr. Nicole Kindred: [phone]  In the event of inclement weather, please call our main line at [phone] for an update on the status of any delays or closures.  Dermatology Medication Tips: Please keep the boxes that topical medications come in in order to help keep track of the instructions about where and how to use these. Pharmacies typically print the medication instructions only on the boxes and not directly on the medication  tubes.   If your medication is too expensive, please contact our office at [phone] option 4 or send [place] a message through [place].   We are unable to tell what your co-pay for medications will be in advance as this is different depending on your insurance coverage. However, we may be able to find a substitute medication at lower cost or fill out paperwork to get insurance to cover a needed medication.   If a prior authorization is required to get your medication covered by your insurance company, please allow [place] 1-2 business days to complete this process.  Drug prices often vary depending on where the prescription is filled and some pharmacies may offer cheaper prices.  The website www.goodrx.com contains coupons for medications through different pharmacies. The prices here do not account for what the cost may be with help from insurance (it may be cheaper with your insurance), but the website can give you the price if you did not use any insurance.  - You can print the associated coupon and take it with your prescription to the pharmacy.  - You may also stop by our office during regular business hours and pick up a GoodRx coupon card.  - If you need your prescription sent electronically to a different pharmacy, notify our office through [facility] or by phone at [phone] option 4.

## 2021-10-17 ENCOUNTER — Encounter: Payer: Self-pay | Admitting: Dermatology

## 2021-11-08 DIAGNOSIS — K56609 Unspecified intestinal obstruction, unspecified as to partial versus complete obstruction: Principal | ICD-10-CM

## 2021-11-08 DIAGNOSIS — R5381 Other malaise: Secondary | ICD-10-CM | POA: Diagnosis present

## 2021-11-08 HISTORY — DX: Unspecified intestinal obstruction, unspecified as to partial versus complete obstruction: K56.609

## 2021-11-13 ENCOUNTER — Ambulatory Visit: Admit: 2021-11-13 | Discharge: 2021-11-14 | Payer: MEDICARE

## 2021-11-13 DIAGNOSIS — M818 Other osteoporosis without current pathological fracture: Principal | ICD-10-CM

## 2021-11-13 DIAGNOSIS — R0602 Shortness of breath: Principal | ICD-10-CM

## 2021-11-13 DIAGNOSIS — I420 Dilated cardiomyopathy: Principal | ICD-10-CM

## 2021-11-13 DIAGNOSIS — R7301 Impaired fasting glucose: Principal | ICD-10-CM

## 2021-11-21 ENCOUNTER — Ambulatory Visit: Admit: 2021-11-21 | Discharge: 2021-11-22 | Payer: MEDICARE

## 2021-11-23 ENCOUNTER — Ambulatory Visit: Admit: 2021-11-23 | Discharge: 2021-11-24 | Payer: MEDICARE

## 2021-12-03 ENCOUNTER — Ambulatory Visit: Admit: 2021-12-03 | Discharge: 2021-12-06 | Disposition: A | Discharge status: Home / Self Care | Payer: MEDICARE

## 2021-12-06 MED ORDER — DIAZEPAM 2 MG TABLET
ORAL_TABLET | Freq: Two times a day (BID) | ORAL | 0 refills | 1 days | Status: CP | PRN
Start: 2021-12-06 — End: ?
  Filled 2021-12-06: qty 2, 1d supply, fill #0

## 2021-12-17 ENCOUNTER — Ambulatory Visit: Admit: 2021-12-17 | Discharge: 2021-12-18 | Payer: MEDICARE

## 2021-12-17 DIAGNOSIS — K56609 Unspecified intestinal obstruction, unspecified as to partial versus complete obstruction: Principal | ICD-10-CM

## 2021-12-17 DIAGNOSIS — K5080 Crohn's disease of both small and large intestine without complications: Principal | ICD-10-CM

## 2021-12-17 DIAGNOSIS — Z87898 Personal history of other specified conditions: Principal | ICD-10-CM

## 2021-12-17 DIAGNOSIS — I420 Dilated cardiomyopathy: Principal | ICD-10-CM

## 2021-12-17 DIAGNOSIS — E119 Type 2 diabetes mellitus without complications: Principal | ICD-10-CM

## 2021-12-20 ENCOUNTER — Encounter: Payer: Self-pay | Admitting: Dermatology

## 2021-12-20 ENCOUNTER — Other Ambulatory Visit: Payer: Self-pay

## 2021-12-20 ENCOUNTER — Ambulatory Visit (INDEPENDENT_AMBULATORY_CARE_PROVIDER_SITE_OTHER): Payer: Medicare Other | Admitting: Dermatology

## 2021-12-20 DIAGNOSIS — Z5189 Encounter for other specified aftercare: Secondary | ICD-10-CM

## 2021-12-20 DIAGNOSIS — Z9889 Other specified postprocedural states: Secondary | ICD-10-CM | POA: Diagnosis not present

## 2021-12-20 DIAGNOSIS — L578 Other skin changes due to chronic exposure to nonionizing radiation: Secondary | ICD-10-CM

## 2021-12-20 DIAGNOSIS — Z85828 Personal history of other malignant neoplasm of skin: Secondary | ICD-10-CM | POA: Diagnosis not present

## 2021-12-20 DIAGNOSIS — S01302A Unspecified open wound of left ear, initial encounter: Secondary | ICD-10-CM | POA: Diagnosis not present

## 2021-12-20 DIAGNOSIS — L570 Actinic keratosis: Secondary | ICD-10-CM

## 2021-12-20 MED ORDER — TRIAMCINOLONE ACETONIDE 0.025 % EX OINT: TOPICAL_OINTMENT | CUTANEOUS | 0 refills | Status: DC

## 2021-12-20 NOTE — Patient Instructions (Addendum)
Do not repeat fluorouracil treatment until ear and face are completely healed  Cryotherapy Aftercare  Wash gently with soap and water everyday.   Apply Vaseline and Band-Aid daily until healed.   Prior to procedure, discussed risks of blister formation, small wound, skin dyspigmentation, or rare scar following cryotherapy. Recommend Vaseline ointment to treated areas while healing.   If You Need Anything After Your Visit  If you have any questions or concerns for your doctor, please call our main line at [phone] and press option 4 to reach your doctor's medical assistant. If no one answers, please leave a voicemail as directed and we will return your call as soon as possible. Messages left after 4 pm will be answered the following business day.   You may also send [place] a message via [place]. We typically respond to MyChart messages within 1-2 business days.  For prescription refills, please ask your pharmacy to contact our office. Our fax number is [phone].  If you have an urgent issue when the clinic is closed that cannot wait until the next business day, you can page your doctor at the number below.    Please note that while we do our best to be available for urgent issues outside of office hours, we are not available 24/7.   If you have an urgent issue and are unable to reach [place], you may choose to seek medical care at your doctor's office, retail clinic, urgent care center, or emergency room.  If you have a medical emergency, please immediately call 911 or go to the emergency department.  Pager Numbers  - Dr. Nehemiah Massed: [phone]  - Dr. Laurence Ferrari: [phone]  - Dr. Nicole Kindred: [phone]  In the event of inclement weather, please call our main line at [phone] for an update on the status of any delays or closures.  Dermatology Medication Tips: Please keep the boxes that topical medications come in in order to help keep track of the instructions about where and how to  use these. Pharmacies typically print the medication instructions only on the boxes and not directly on the medication tubes.   If your medication is too expensive, please contact our office at [phone] option 4 or send [place] a message through [place].   We are unable to tell what your co-pay for medications will be in advance as this is different depending on your insurance coverage. However, we may be able to find a substitute medication at lower cost or fill out paperwork to get insurance to cover a needed medication.   If a prior authorization is required to get your medication covered by your insurance company, please allow [place] 1-2 business days to complete this process.  Drug prices often vary depending on where the prescription is filled and some pharmacies may offer cheaper prices.  The website www.goodrx.com contains coupons for medications through different pharmacies. The prices here do not account for what the cost may be with help from insurance (it may be cheaper with your insurance), but the website can give you the price if you did not use any insurance.  - You can print the associated coupon and take it with your prescription to the pharmacy.  - You may also stop by our office during regular business hours and pick up a GoodRx coupon card.  - If you need your prescription sent electronically to a different pharmacy, notify our office through [facility] or by phone at [phone] option 4.     Si Usted [place]  Algo Despus de Su Visita  Tambin puede enviarnos un mensaje a travs de MyChart. Por lo general respondemos a los mensajes de MyChart en el transcurso de 1 a 2 das hbiles.  Para renovar recetas, por favor pida a su farmacia que se ponga en contacto con nuestra oficina. Harland Dingwall de fax es [place] [phone].  Si tiene un asunto urgente cuando la clnica est cerrada y que no puede esperar hasta el siguiente da hbil, puede llamar/localizar a su  doctor(a) al nmero que aparece a continuacin.   Por favor, tenga en cuenta que aunque hacemos todo lo posible para estar disponibles para asuntos urgentes fuera del horario de [place], no estamos disponibles las 24 horas del da, los 7 das de la [place].   Si tiene un problema urgente y no puede comunicarse con nosotros, puede optar por buscar atencin mdica  en el consultorio de su doctor(a), en una clnica privada, en un centro de atencin urgente o en una sala de emergencias.  Si tiene Engineering geologist, por favor llame inmediatamente al 911 o vaya a la sala de emergencias.  Nmeros de bper  - Dr. Nehemiah Massed: [phone]  - Dra. Able Malloy: [phone]  - Dra. Nicole Kindred: [phone]  En caso de inclemencias del [place], por favor llame a Johnsie Kindred principal al [phone] para una actualizacin sobre el [place] de cualquier retraso o cierre.  Consejos para la medicacin en dermatologa: Por favor, guarde las cajas en las que vienen los medicamentos de uso tpico para ayudarle a seguir las instrucciones sobre dnde y cmo usarlos. Las farmacias generalmente imprimen las instrucciones del medicamento slo en las cajas y no directamente en los tubos del [place].   Si su medicamento es muy caro, por favor, pngase en contacto con Zigmund Daniel llamando al [phone] y presione la opcin 4 o envenos un mensaje a travs de Pharmacist, community.   No podemos decirle cul ser su copago por los medicamentos por adelantado ya que esto es diferente dependiendo de la cobertura de su seguro. Sin embargo, es posible que podamos encontrar un medicamento sustituto a Electrical engineer un formulario para que el seguro cubra el medicamento que se considera necesario.   Si se requiere una autorizacin previa para que su compaa de seguros [place] su medicamento, por favor permtanos de 1 a 2 das hbiles para completar este proceso.  Los precios de los medicamentos varan con frecuencia dependiendo del  Environmental consultant de dnde se surte la receta y alguna farmacias pueden ofrecer precios ms baratos.  El sitio web www.goodrx.com tiene cupones para medicamentos de Airline pilot. Los precios aqu no tienen en cuenta lo que podra costar con la ayuda del seguro (puede ser ms barato con su seguro), pero el sitio web puede darle el precio si no utiliz Research scientist (physical sciences).  - Puede imprimir el cupn correspondiente y llevarlo con su receta a la farmacia.  - Tambin puede pasar por nuestra oficina durante el horario de atencin regular y Charity fundraiser una tarjeta de cupones de GoodRx.  - Si necesita que su receta se enve electrnicamente a una farmacia diferente, informe a nuestra oficina a travs de MyChart de [place] o por telfono llamando al [phone] y presione la opcin 4.

## 2021-12-20 NOTE — Progress Notes (Signed)
Follow-Up Visit   Subjective  Dean Ryan is a 81 y.o. male who presents for the following: Recheck AK's (Scalp, neck, arms, ears. Hx of LN2 treatment. 5FU/Calcipotriene treatment finished a 4 day course 12/14/2021. Check for new lesions today. ), ISK recheck (8 week recheck. Right neck. Tx with LN2 at last visit. ), and Wound Check (Left helix. Hx of Mohs surgery for SCC. S/R 12/13/2021. ).   The following portions of the chart were reviewed this encounter and updated as appropriate:  Allergies   Meds   Problems   Med Hx   Surg Hx   Fam Hx       Review of Systems: No other skin or systemic complaints except as noted in HPI or Assessment and Plan.   Objective  Well appearing patient in no apparent distress; mood and affect are within normal limits.  A focused examination was performed including head, arms, hands. Relevant physical exam findings are noted in the Assessment and Plan.  face Moderate erythema/inflammation.  left inferior helix x1, left dorsal hand x1 (2) Erythematous thin papules/macules with gritty scale.   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        Assessment & Plan  Actinic skin damage face  Severe, confluent actinic changes with pre-cancerous actinic keratoses  - Severe, chronic, not at goal, secondary to cumulative UV radiation exposure over time S/p one round of 5FU/calcipotriene to arms and hands with improvement but still with severe damage S/p recent treatment 5FU/calcipotriene to face with good response, now healing - diffuse scaly erythematous macules and papules with underlying dyspigmentation - Discussed Prescription "Field Treatment" for Severe, Chronic Confluent Actinic Changes with Pre-Cancerous Actinic Keratoses Field treatment involves treatment of an entire area of skin that has confluent Actinic Changes (Sun/ Ultraviolet light damage) and PreCancerous Actinic Keratoses by method of PhotoDynamic Therapy (PDT)  and/or prescription Topical Chemotherapy agents such as 5-fluorouracil, 5-fluorouracil/calcipotriene, and/or imiquimod.  The purpose is to decrease the number of clinically evident and subclinical PreCancerous lesions to prevent progression to development of skin cancer by chemically destroying early precancer changes that may or may not be visible.  It has been shown to reduce the risk of developing skin cancer in the treated area. As a result of treatment, redness, scaling, crusting, and open sores may occur during treatment course. One or more than one of these methods may be used and may have to be used several times to control, suppress and eliminate the PreCancerous changes. Discussed treatment course, expected reaction, and possible side effects. - Recommend daily broad spectrum sunscreen SPF 30+ to sun-exposed areas, reapply every 2 hours as needed.  - Staying in the shade or wearing long sleeves, sun glasses (UVA+UVB protection) and wide brim hats (4-inch brim around the entire circumference of the hat) are also recommended. - Call for new or changing lesions.  Start Triamcinolone 0.025% ointment to face twice daily for 3 days. Avoid ears and eyelids.  Once face and ear have healed completely repeat 5FU/Calcipotriene treatment to arms twice a day for 7 days   triamcinolone (KENALOG) 0.025 % ointment - face Apply to face twice daily for 3 days. Avoid ears and eyelids.  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 They are chronic with expected duration over 1 year. A portion of actinic keratoses will progress to squamous  cell carcinoma of the skin. It is not possible to reliably predict which spots will progress to skin cancer and so treatment is recommended to prevent development of skin cancer.  Recommend daily broad spectrum  sunscreen SPF 30+ to sun-exposed areas, reapply every 2 hours as needed.  Recommend staying in the shade or wearing long sleeves, sun glasses (UVA+UVB protection) and wide brim hats (4-inch brim around the entire circumference of the hat). Call for new or changing lesions.  Prior to procedure, discussed risks of blister formation, small wound, skin dyspigmentation, or rare scar following cryotherapy. Recommend Vaseline ointment to treated areas while healing.   Destruction of lesion - left inferior helix x1, left dorsal hand x1  Destruction method: cryotherapy   Informed consent: discussed and consent obtained   Lesion destroyed using liquid nitrogen: Yes   Outcome: patient tolerated procedure well with no complications   Post-procedure details: wound care instructions given    Visit for wound check Left Superior Helix  S/P Mohs surgery with skin graft  Recently checked by Mohs surgery, will monitor  Advised patient not to use flurouracil until after this skin is completely healed   History of Squamous Cell Carcinoma of the Skin - No evidence of recurrence today at right cheek  - No lymphadenopathy - Recommend regular full body skin exams - Recommend daily broad spectrum sunscreen SPF 30+ to sun-exposed areas, reapply every 2 hours as needed.  - Call if any new or changing lesions are noted between office visits    Return for TBSE, AK Follow Up in 2 months.  I, Emelia Salisbury, CMA, am acting as scribe for Forest Gleason, MD.  Documentation: I have reviewed the above documentation for accuracy and completeness, and I agree with the above.  Forest Gleason, MD

## 2021-12-24 ENCOUNTER — Telehealth: Payer: Self-pay

## 2021-12-24 NOTE — Telephone Encounter (Signed)
Updating history and specimen tracking to left helix/MOHs. aw

## 2021-12-30 ENCOUNTER — Encounter: Payer: Self-pay | Admitting: Dermatology

## 2022-01-29 ENCOUNTER — Ambulatory Visit: Admit: 2022-01-29 | Payer: MEDICARE | Attending: Speech-Language Pathologist | Primary: Speech-Language Pathologist

## 2022-01-29 ENCOUNTER — Ambulatory Visit: Admit: 2022-01-29 | Discharge: 2022-01-30 | Payer: MEDICARE | Attending: Otolaryngology | Primary: Otolaryngology

## 2022-02-07 ENCOUNTER — Other Ambulatory Visit: Payer: Self-pay

## 2022-02-07 ENCOUNTER — Ambulatory Visit (INDEPENDENT_AMBULATORY_CARE_PROVIDER_SITE_OTHER): Payer: Medicare Other | Admitting: Dermatology

## 2022-02-07 ENCOUNTER — Encounter: Payer: Self-pay | Admitting: Dermatology

## 2022-02-07 DIAGNOSIS — L820 Inflamed seborrheic keratosis: Secondary | ICD-10-CM

## 2022-02-07 DIAGNOSIS — D1800 Hemangioma unspecified site: Secondary | ICD-10-CM

## 2022-02-07 DIAGNOSIS — Z872 Personal history of diseases of the skin and subcutaneous tissue: Secondary | ICD-10-CM

## 2022-02-07 DIAGNOSIS — D229 Melanocytic nevi, unspecified: Secondary | ICD-10-CM

## 2022-02-07 DIAGNOSIS — L570 Actinic keratosis: Secondary | ICD-10-CM

## 2022-02-07 DIAGNOSIS — L821 Other seborrheic keratosis: Secondary | ICD-10-CM

## 2022-02-07 DIAGNOSIS — Z1283 Encounter for screening for malignant neoplasm of skin: Secondary | ICD-10-CM | POA: Diagnosis not present

## 2022-02-07 DIAGNOSIS — L578 Other skin changes due to chronic exposure to nonionizing radiation: Secondary | ICD-10-CM | POA: Diagnosis not present

## 2022-02-07 DIAGNOSIS — Z85828 Personal history of other malignant neoplasm of skin: Secondary | ICD-10-CM | POA: Diagnosis not present

## 2022-02-07 DIAGNOSIS — L814 Other melanin hyperpigmentation: Secondary | ICD-10-CM

## 2022-02-07 NOTE — Patient Instructions (Addendum)
Cryotherapy Aftercare  Wash gently with soap and water everyday.   Apply Vaseline and Band-Aid daily until healed.   Prior to procedure, discussed risks of blister formation, small wound, skin dyspigmentation, or rare scar following cryotherapy. Recommend Vaseline ointment to treated areas while healing.     Recommend taking Heliocare sun protection supplement daily in sunny weather for additional sun protection. For maximum protection on the sunniest days, you can take up to 2 capsules of regular Heliocare OR take 1 capsule of Heliocare Ultra. For prolonged exposure (such as a full day in the sun), you can repeat your dose of the supplement 4 hours after your first dose. Heliocare can be purchased at [employer], at some [facility] or at [email or web address].      Recommend daily broad spectrum sunscreen SPF 30+ to sun-exposed areas, reapply every 2 hours as needed. Call for new or changing lesions.  Staying in the shade or wearing long sleeves, sun glasses (UVA+UVB protection) and wide brim hats (4-inch brim around the entire circumference of the hat) are also recommended for sun protection.      Melanoma ABCDEs  Melanoma is the most dangerous type of skin cancer, and is the leading cause of death from skin disease.  You are more likely to develop melanoma if you: Have light-colored skin, light-colored eyes, or red or blond hair Spend a lot of time in the sun Tan regularly, either outdoors or in a tanning bed Have had blistering sunburns, especially during childhood Have a close family member who has had a melanoma Have atypical moles or large birthmarks  Early detection of melanoma is key since treatment is typically straightforward and cure rates are extremely high if we catch it early.   The first sign of melanoma is often a change in a mole or a new dark spot.  The ABCDE system is a way of remembering the signs of melanoma.  A for asymmetry:  The two halves do not  match. B for border:  The edges of the growth are irregular. C for color:  A mixture of colors are present instead of an even brown color. D for diameter:  Melanomas are usually (but not always) greater than 29m - the size of a pencil eraser. E for evolution:  The spot keeps changing in size, shape, and color.  Please check your skin once per month between visits. You can use a small mirror in front and a large mirror behind you to keep an eye on the back side or your body.   If you see any new or changing lesions before your next follow-up, please call to schedule a visit.  Please continue daily skin protection including broad spectrum sunscreen SPF 30+ to sun-exposed areas, reapplying every 2 hours as needed when you're outdoors.   Staying in the shade or wearing long sleeves, sun glasses (UVA+UVB protection) and wide brim hats (4-inch brim around the entire circumference of the hat) are also recommended for sun protection.       If You Need Anything After Your Visit  If you have any questions or concerns for your doctor, please call our main line at [phone] press option 4 to reach your doctor's medical assistant. If no one answers, please leave a voicemail as directed and we will return your call as soon as possible. Messages left after 4 pm will be answered the following business day.   You may also send [place] message via [place] We typically respond to  MyChart messages within 1-2 business days.  For prescription refills, please ask your pharmacy to contact our office. Our fax number is [phone].  If you have an urgent issue when the clinic is closed that cannot wait until the next business day, you can page your doctor at the number below.    Please note that while we do our best to be available for urgent issues outside of office hours, we are not available 24/7.   If you have an urgent issue and are unable to reach [place], you may choose to seek medical care at your  doctor's office, retail clinic, urgent care center, or emergency room.  If you have a medical emergency, please immediately call 911 or go to the emergency department.  Pager Numbers  - Dr. Nehemiah Massed: [phone]  - Dr. Laurence Ferrari: [phone]  - Dr. Nicole Kindred: [phone]  In the event of inclement weather, please call our main line at [phone] for an update on the status of any delays or closures.  Dermatology Medication Tips: Please keep the boxes that topical medications come in in order to help keep track of the instructions about where and how to use these. Pharmacies typically print the medication instructions only on the boxes and not directly on the medication tubes.   If your medication is too expensive, please contact our office at [phone] option 4 or send [place] a message through [place].   We are unable to tell what your co-pay for medications will be in advance as this is different depending on your insurance coverage. However, we may be able to find a substitute medication at lower cost or fill out paperwork to get insurance to cover a needed medication.   If a prior authorization is required to get your medication covered by your insurance company, please allow [place] 1-2 business days to complete this process.  Drug prices often vary depending on where the prescription is filled and some pharmacies may offer cheaper prices.  The website www.goodrx.com contains coupons for medications through different pharmacies. The prices here do not account for what the cost may be with help from insurance (it may be cheaper with your insurance), but the website can give you the price if you did not use any insurance.  - You can print the associated coupon and take it with your prescription to the pharmacy.  - You may also stop by our office during regular business hours and pick up a GoodRx coupon card.  - If you need your prescription sent electronically to a different pharmacy, notify  our office through [facility] or by phone at [phone] option 4.     Si Usted Necesita Algo Despus de Su Visita  Tambin puede enviarnos un mensaje a travs de Pharmacist, community. Por lo general respondemos a los mensajes de MyChart en el transcurso de 1 a 2 das hbiles.  Para renovar recetas, por favor pida a su farmacia que se ponga en contacto con nuestra oficina. Harland Dingwall de fax es [place] [phone].  Si tiene un asunto urgente cuando la clnica est cerrada y que no puede esperar hasta el siguiente da hbil, puede llamar/localizar a su doctor(a) al nmero que aparece a continuacin.   Por favor, tenga en cuenta que aunque hacemos todo lo posible para estar disponibles para asuntos urgentes fuera del horario de [place], no estamos disponibles las 24 horas del da, los 7 das de la [place].   Si tiene un problema urgente y no puede comunicarse con nosotros, puede  optar por buscar atencin mdica  en el consultorio de su doctor(a), en una clnica privada, en un centro de atencin urgente o en una sala de emergencias.  Si tiene Engineering geologist, por favor llame inmediatamente al 911 o vaya a la sala de emergencias.  Nmeros de bper  - Dr. Nehemiah Massed: [phone]  - Dra. Moye: [phone]  - Dra. Nicole Kindred: [phone]  En caso de inclemencias del [place], por favor llame a Johnsie Kindred principal al [phone] para una actualizacin sobre el [place] de cualquier retraso o cierre.  Consejos para la medicacin en dermatologa: Por favor, guarde las cajas en las que vienen los medicamentos de uso tpico para ayudarle a seguir las instrucciones sobre dnde y cmo usarlos. Las farmacias generalmente imprimen las instrucciones del medicamento slo en las cajas y no directamente en los tubos del [place].   Si su medicamento es muy caro, por favor, pngase en contacto con Zigmund Daniel llamando al [phone] y presione la opcin 4 o envenos un mensaje a travs de  Pharmacist, community.   No podemos decirle cul ser su copago por los medicamentos por adelantado ya que esto es diferente dependiendo de la cobertura de su seguro. Sin embargo, es posible que podamos encontrar un medicamento sustituto a Electrical engineer un formulario para que el seguro cubra el medicamento que se considera necesario.   Si se requiere una autorizacin previa para que su compaa de seguros [place] su medicamento, por favor permtanos de 1 a 2 das hbiles para completar este proceso.  Los precios de los medicamentos varan con frecuencia dependiendo del Environmental consultant de dnde se surte la receta y alguna farmacias pueden ofrecer precios ms baratos.  El sitio web www.goodrx.com tiene cupones para medicamentos de Airline pilot. Los precios aqu no tienen en cuenta lo que podra costar con la ayuda del seguro (puede ser ms barato con su seguro), pero el sitio web puede darle el precio si no utiliz Research scientist (physical sciences).  - Puede imprimir el cupn correspondiente y llevarlo con su receta a la farmacia.  - Tambin puede pasar por nuestra oficina durante el horario de atencin regular y Charity fundraiser una tarjeta de cupones de GoodRx.  - Si necesita que su receta se enve electrnicamente a una farmacia diferente, informe a nuestra oficina a travs de MyChart de [place] o por telfono llamando al [phone] y presione la opcin 4.

## 2022-02-07 NOTE — Progress Notes (Signed)
? ?Follow-Up Visit ?  ?Subjective  ?Dean Ryan is a 81 y.o. male who presents for the following: Annual Exam (Here for skin cancer screening. Full body. HxAKs, SCC's). ? ?The patient presents for Total-Body Skin Exam (TBSE) for skin cancer screening and mole check.  The patient has spots, moles and lesions to be evaluated, some may be new or changing and the patient has concerns that these could be cancer. ? ? ?The following portions of the chart were reviewed this encounter and updated as appropriate:  Allergies  Meds  Problems  Med Hx  Surg Hx  Fam Hx   ?  ? ?Review of Systems: No other skin or systemic complaints except as noted in HPI or Assessment and Plan. ? ? ?Objective  ?Well appearing patient in no apparent distress; mood and affect are within normal limits. ? ?A full examination was performed including scalp, head, eyes, ears, nose, lips, neck, chest, axillae, abdomen, back, buttocks, bilateral upper extremities, bilateral lower extremities, hands, feet, fingers, toes, fingernails, and toenails. All findings within normal limits unless otherwise noted below. ? ?Left Upper Back x1 ?Erythematous keratotic or waxy stuck-on papule or plaque. ? ?Left Mid Back x1, left neck x3, right forehead x1, right dorsal hand x3 (8) ?Erythematous thin papules/macules with gritty scale.  ? ? ?Assessment & Plan  ? ?Lentigines ?- Scattered tan macules ?- Due to sun exposure ?- Benign-appearing, observe ?- Recommend daily broad spectrum sunscreen SPF 30+ to sun-exposed areas, reapply every 2 hours as needed. ?- Call for any changes ? ?Seborrheic Keratoses ?- Stuck-on, waxy, tan-brown papules and/or plaques  ?- Benign-appearing ?- Discussed benign etiology and prognosis. ?- Observe ?- Call for any changes ? ?Melanocytic Nevi ?- Tan-brown and/or pink-flesh-colored symmetric macules and papules ?- Benign appearing on exam today ?- Observation ?- Call clinic for new or changing moles ?- Recommend daily use of broad  spectrum spf 30+ sunscreen to sun-exposed areas.  ? ?Hemangiomas ?- Red papules ?- Discussed benign nature ?- Observe ?- Call for any changes ? ?Actinic Damage ?- Chronic condition, secondary to cumulative UV/sun exposure ?- diffuse scaly erythematous macules with underlying dyspigmentation ?- Recommend daily broad spectrum sunscreen SPF 30+ to sun-exposed areas, reapply every 2 hours as needed.  ?- Staying in the shade or wearing long sleeves, sun glasses (UVA+UVB protection) and wide brim hats (4-inch brim around the entire circumference of the hat) are also recommended for sun protection.  ?- Call for new or changing lesions. ? 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  ?- Call if any new or changing lesions are noted between office visits ? ?  ? ?Skin cancer screening performed today. ? ?Inflamed seborrheic keratosis ?Left Upper Back x1 ? ?Symptomatic ? ?Prior to procedure, discussed risks of blister formation, small wound, skin dyspigmentation, or rare scar following cryotherapy. Recommend Vaseline ointment to treated areas while healing. ? ? ?Destruction of lesion - Left Upper Back x1 ? ?Destruction method: cryotherapy   ?Informed consent: discussed and consent obtained   ?Lesion destroyed using liquid nitrogen: Yes   ?Outcome: patient tolerated procedure well with no complications   ?Post-procedure details: wound care instructions given   ? ?AK (actinic keratosis) (8) ?Left Mid Back x1, left neck x3, right forehead x1, right  dorsal hand x3 ? ?Actinic keratoses are precancerous spots that appear secondary to cumulative UV radiation exposure/sun exposure over time. They are chronic with expected duration over 1 year. A portion of actinic keratoses  will progress to squamous cell carcinoma of the skin. It is not possible to reliably predict which spots will progress to skin cancer and so treatment is recommended to prevent development of skin cancer. ? ?Recommend daily broad spectrum sunscreen SPF 30+ to sun-exposed areas, reapply every 2 hours as needed.  ?Recommend staying in the shade or wearing long sleeves, sun glasses (UVA+UVB protection) and wide brim hats (4-inch brim around the entire circumference of the hat). ?Call for new or changing lesions. ? ?Prior to procedure, discussed risks of blister formation, small wound, skin dyspigmentation, or rare scar following cryotherapy. Recommend Vaseline ointment to treated areas while healing. ? ? ?Destruction of lesion - Left Mid Back x1, left neck x3, right forehead x1, right dorsal hand x3 ? ?Destruction method: cryotherapy   ?Informed consent: discussed and consent obtained   ?Lesion destroyed using liquid nitrogen: Yes   ?Outcome: patient tolerated procedure well with no complications   ?Post-procedure details: wound care instructions given   ? ? ?Return in about 6 months (around 08/10/2022) for TBSE. ? ?I, Emelia Salisbury, CMA, am acting as scribe for Forest Gleason, MD. ? ?Documentation: I have reviewed the above documentation for accuracy and completeness, and I agree with the above. ? ?Forest Gleason, MD ? ? ?

## 2022-02-10 ENCOUNTER — Encounter: Payer: Self-pay | Admitting: Dermatology

## 2022-03-01 ENCOUNTER — Telehealth: Admit: 2022-03-01 | Payer: MEDICARE | Attending: Speech-Language Pathologist | Primary: Speech-Language Pathologist

## 2022-04-04 MED ORDER — METOPROLOL SUCCINATE ER 50 MG TABLET,EXTENDED RELEASE 24 HR
ORAL_TABLET | 3 refills | 0 days | Status: CP
Start: 2022-04-04 — End: ?

## 2022-04-11 ENCOUNTER — Ambulatory Visit: Admit: 2022-04-11 | Payer: MEDICARE | Attending: Speech-Language Pathologist | Primary: Speech-Language Pathologist

## 2022-04-30 ENCOUNTER — Ambulatory Visit: Admit: 2022-04-30 | Payer: MEDICARE

## 2022-04-30 ENCOUNTER — Ambulatory Visit: Admit: 2022-04-30 | Discharge: 2022-05-01 | Payer: MEDICARE | Attending: Otolaryngology | Primary: Otolaryngology

## 2022-05-04 ENCOUNTER — Emergency Department: Payer: Medicare Other

## 2022-05-04 ENCOUNTER — Other Ambulatory Visit: Payer: Self-pay

## 2022-05-04 ENCOUNTER — Inpatient Hospital Stay
Admission: EM | Admit: 2022-05-04 | Discharge: 2022-05-09 | DRG: 386 | Disposition: A | Discharge status: Home / Self Care | Payer: Medicare Other | Attending: Surgery | Admitting: Surgery

## 2022-05-04 DIAGNOSIS — K565 Intestinal adhesions [bands], unspecified as to partial versus complete obstruction: Secondary | ICD-10-CM | POA: Diagnosis present

## 2022-05-04 DIAGNOSIS — R5381 Other malaise: Secondary | ICD-10-CM | POA: Diagnosis present

## 2022-05-04 DIAGNOSIS — Z87891 Personal history of nicotine dependence: Secondary | ICD-10-CM

## 2022-05-04 DIAGNOSIS — K56609 Unspecified intestinal obstruction, unspecified as to partial versus complete obstruction: Principal | ICD-10-CM

## 2022-05-04 DIAGNOSIS — Z9049 Acquired absence of other specified parts of digestive tract: Secondary | ICD-10-CM | POA: Diagnosis not present

## 2022-05-04 DIAGNOSIS — Z933 Colostomy status: Secondary | ICD-10-CM

## 2022-05-04 DIAGNOSIS — Z79899 Other long term (current) drug therapy: Secondary | ICD-10-CM

## 2022-05-04 DIAGNOSIS — C801 Malignant (primary) neoplasm, unspecified: Secondary | ICD-10-CM | POA: Diagnosis present

## 2022-05-04 DIAGNOSIS — K631 Perforation of intestine (nontraumatic): Secondary | ICD-10-CM | POA: Insufficient documentation

## 2022-05-04 DIAGNOSIS — N183 Chronic kidney disease, stage 3 unspecified: Secondary | ICD-10-CM | POA: Diagnosis present

## 2022-05-04 DIAGNOSIS — R001 Bradycardia, unspecified: Secondary | ICD-10-CM | POA: Diagnosis present

## 2022-05-04 DIAGNOSIS — N179 Acute kidney failure, unspecified: Secondary | ICD-10-CM | POA: Diagnosis present

## 2022-05-04 DIAGNOSIS — I447 Left bundle-branch block, unspecified: Secondary | ICD-10-CM | POA: Diagnosis present

## 2022-05-04 DIAGNOSIS — K50812 Crohn's disease of both small and large intestine with intestinal obstruction: Principal | ICD-10-CM | POA: Diagnosis present

## 2022-05-04 LAB — CBC
HCT: 40.8 % (ref 39.0–52.0)
Hemoglobin: 13.7 g/dL (ref 13.0–17.0)
Hemoglobin: 13.1 g/dL (ref 13.0–17.0)
MCH: 33 pg (ref 26.0–34.0)
MCH: 33.3 pg (ref 26.0–34.0)
MCHC: 33.6 g/dL (ref 30.0–36.0)
MCV: 98.3 fL (ref 80.0–100.0)
Platelets: 323 10*3/uL (ref 150–400)
RBC: 4.15 MIL/uL — ABNORMAL LOW (ref 4.22–5.81)
RDW: 14 % (ref 11.5–15.5)
WBC: 25.5 10*3/uL — ABNORMAL HIGH (ref 4.0–10.5)
WBC: 15.7 10*3/uL — ABNORMAL HIGH (ref 4.0–10.5)
nRBC: 0 % (ref 0.0–0.2)
nRBC: 0 % (ref 0.0–0.2)

## 2022-05-04 LAB — COMPREHENSIVE METABOLIC PANEL
ALT: 33 U/L (ref 0–44)
AST: 56 U/L — ABNORMAL HIGH (ref 15–41)
Albumin: 2.7 g/dL — ABNORMAL LOW (ref 3.5–5.0)
Alkaline Phosphatase: 220 U/L — ABNORMAL HIGH (ref 38–126)
Anion gap: 7 (ref 5–15)
BUN: 32 mg/dL — ABNORMAL HIGH (ref 8–23)
CO2: 21 mmol/L — ABNORMAL LOW (ref 22–32)
Calcium: 8.3 mg/dL — ABNORMAL LOW (ref 8.9–10.3)
Chloride: 112 mmol/L — ABNORMAL HIGH (ref 98–111)
Creatinine, Ser: 1.71 mg/dL — ABNORMAL HIGH (ref 0.61–1.24)
GFR, Estimated: 40 mL/min — ABNORMAL LOW (ref >60)
Glucose, Bld: 168 mg/dL — ABNORMAL HIGH (ref 70–99)
Potassium: 4 mmol/L (ref 3.5–5.1)
Sodium: 140 mmol/L (ref 135–145)
Total Bilirubin: 1.7 mg/dL — ABNORMAL HIGH (ref 0.3–1.2)
Total Protein: 7.2 g/dL (ref 6.5–8.1)

## 2022-05-04 LAB — LACTIC ACID, PLASMA
Lactic Acid, Venous: 2.5 mmol/L (ref 0.5–1.9)
Lactic Acid, Venous: 1.7 mmol/L (ref 0.5–1.9)

## 2022-05-04 LAB — URINALYSIS, ROUTINE W REFLEX MICROSCOPIC
Bilirubin Urine: NEGATIVE
Glucose, UA: NEGATIVE mg/dL
Hgb urine dipstick: NEGATIVE
Ketones, ur: NEGATIVE mg/dL
Leukocytes,Ua: NEGATIVE
Nitrite: NEGATIVE
Protein, ur: NEGATIVE mg/dL
Specific Gravity, Urine: 1.028 (ref 1.005–1.030)
pH: 5 (ref 5.0–8.0)

## 2022-05-04 LAB — LIPASE, BLOOD: Lipase: 45 U/L (ref 11–51)

## 2022-05-04 MED ORDER — ONDANSETRON HCL 4 MG/2ML IJ SOLN
4.0000 mg | Freq: Once | INTRAMUSCULAR | Status: AC
  Administered 2022-05-04: 4 mg via INTRAVENOUS
  Filled 2022-05-04: qty 2

## 2022-05-04 MED ORDER — IOHEXOL 300 MG/ML  SOLN
80.0000 mL | Freq: Once | INTRAMUSCULAR | Status: AC | PRN
Start: 2022-05-04 — End: 2022-05-04
  Administered 2022-05-04: 75 mL via INTRAVENOUS

## 2022-05-04 MED ORDER — LOSARTAN POTASSIUM 50 MG PO TABS
100.0000 mg | ORAL_TABLET | Freq: Every day | ORAL | Status: DC
  Administered 2022-05-05 – 2022-05-06 (×2): 100 mg via ORAL
  Filled 2022-05-04 (×3): qty 2

## 2022-05-04 MED ORDER — SODIUM CHLORIDE 0.9 % IV BOLUS
1000.0000 mL | Freq: Once | INTRAVENOUS | Status: AC
Start: 2022-05-04 — End: 2022-05-04
  Administered 2022-05-04: 1000 mL via INTRAVENOUS

## 2022-05-04 MED ORDER — HYDROCODONE-ACETAMINOPHEN 5-325 MG PO TABS
1.0000 | ORAL_TABLET | ORAL | Status: DC | PRN
  Administered 2022-05-04: 2 via ORAL
  Filled 2022-05-04: qty 2

## 2022-05-04 MED ORDER — DIATRIZOATE MEGLUMINE & SODIUM 66-10 % PO SOLN
90.0000 mL | Freq: Once | ORAL | Status: AC
  Administered 2022-05-04: 90 mL via ORAL

## 2022-05-04 MED ORDER — TRAMADOL HCL 50 MG PO TABS: 50.0000 mg | ORAL_TABLET | Freq: Four times a day (QID) | ORAL | Status: DC | PRN

## 2022-05-04 MED ORDER — ENOXAPARIN SODIUM 40 MG/0.4ML IJ SOSY
40.0000 mg | PREFILLED_SYRINGE | INTRAMUSCULAR | Status: DC
  Administered 2022-05-05: 40 mg via SUBCUTANEOUS
  Filled 2022-05-04: qty 0.4

## 2022-05-04 MED ORDER — LACTATED RINGERS IV SOLN: INTRAVENOUS | Status: DC

## 2022-05-04 MED ORDER — PANTOPRAZOLE SODIUM 40 MG PO TBEC
40.0000 mg | DELAYED_RELEASE_TABLET | Freq: Every day | ORAL | Status: DC
  Administered 2022-05-04 – 2022-05-09 (×6): 40 mg via ORAL
  Filled 2022-05-04 (×6): qty 1

## 2022-05-04 MED ORDER — PIPERACILLIN-TAZOBACTAM 3.375 G IVPB 30 MIN
3.3750 g | Freq: Once | INTRAVENOUS | Status: AC
Start: 2022-05-04 — End: 2022-05-04
  Administered 2022-05-04: 3.375 g via INTRAVENOUS
  Filled 2022-05-04: qty 50

## 2022-05-04 MED ORDER — DOCUSATE SODIUM 100 MG PO CAPS: 100.0000 mg | ORAL_CAPSULE | Freq: Two times a day (BID) | ORAL | Status: DC | PRN

## 2022-05-04 MED ORDER — METOPROLOL SUCCINATE ER 50 MG PO TB24
50.0000 mg | ORAL_TABLET | Freq: Every day | ORAL | Status: DC
  Administered 2022-05-04 – 2022-05-06 (×3): 50 mg via ORAL
  Filled 2022-05-04 (×4): qty 1

## 2022-05-04 MED ORDER — ONDANSETRON 4 MG PO TBDP: 4.0000 mg | ORAL_TABLET | Freq: Four times a day (QID) | ORAL | Status: DC | PRN

## 2022-05-04 MED ORDER — SODIUM CHLORIDE 0.9 % IV BOLUS
1000.0000 mL | Freq: Once | INTRAVENOUS | Status: AC
  Administered 2022-05-04: 1000 mL via INTRAVENOUS

## 2022-05-04 MED ORDER — MORPHINE SULFATE (PF) 4 MG/ML IV SOLN
4.0000 mg | Freq: Once | INTRAVENOUS | Status: AC
  Administered 2022-05-04: 4 mg via INTRAVENOUS
  Filled 2022-05-04: qty 1

## 2022-05-04 MED ORDER — PIPERACILLIN-TAZOBACTAM 3.375 G IVPB
3.3750 g | Freq: Three times a day (TID) | INTRAVENOUS | Status: DC
  Administered 2022-05-05 – 2022-05-08 (×11): 3.375 g via INTRAVENOUS
  Filled 2022-05-04 (×11): qty 50

## 2022-05-04 MED ORDER — ONDANSETRON HCL 4 MG/2ML IJ SOLN
4.0000 mg | Freq: Four times a day (QID) | INTRAMUSCULAR | Status: DC | PRN
  Administered 2022-05-04: 4 mg via INTRAVENOUS
  Filled 2022-05-04: qty 2

## 2022-05-04 MED ORDER — LORATADINE 10 MG PO TABS
10.0000 mg | ORAL_TABLET | Freq: Every day | ORAL | Status: DC
Start: 2022-05-04 — End: 2022-05-09
  Administered 2022-05-04 – 2022-05-09 (×6): 10 mg via ORAL
  Filled 2022-05-04 (×6): qty 1

## 2022-05-04 MED ORDER — MORPHINE SULFATE (PF) 2 MG/ML IV SOLN
2.0000 mg | INTRAVENOUS | Status: DC | PRN
  Administered 2022-05-05: 2 mg via INTRAVENOUS
  Filled 2022-05-04: qty 1

## 2022-05-04 NOTE — Consult Note (Deleted)
Subjective:   CC: SBO, intra-abdominal fluid  HPI:  Dean Ryan is a 81 y.o. male who was consulted by [facility] for issue above.  Symptoms were first noted 1 week ago. Pain is sharp, abdominal, worse around periumbilical area.  Associated with N/V, exacerbated by nothing.  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  Currently on stelara.   Last stool noted in bag this am.   Past Medical History:  has a past medical history of SCC (squamous cell carcinoma) (09/13/2021), SCC (squamous cell carcinoma) (09/13/2021), and Small bowel obstruction ([place]) (11/2021).  Past Surgical History:  Past Surgical History:  Procedure Laterality Date   COLOSTOMY  2022    Family History: reviewed and not relevant to CC  Social History:  reports that he quit smoking about 38 years ago. His smoking use included cigarettes. He has never used smokeless tobacco. He reports current alcohol use. He reports that he does not currently use drugs.  Current Medications:  Prior to Admission medications   Medication Sig Start Date End Date Taking? Authorizing Provider  Cholecalciferol 25 MCG (1000 UT) capsule Take 1,000 Units by mouth daily.     [provider]  furosemide (LASIX) 40 MG tablet Take 40 mg by mouth daily.     [provider]  loratadine (CLARITIN) 10 MG tablet Take 10 mg by mouth daily.     [provider]  losartan (COZAAR) 100 MG tablet Take 100 mg by mouth daily.  11/25/17   [provider]  mesalamine (APRISO) 0.375 g 24 hr capsule Take 4 capsules by mouth daily.    [provider]  metoprolol succinate (TOPROL-XL) 50 MG 24 hr tablet Take 1 tablet by mouth daily. 10/23/17   [provider]  Multiple Vitamins-Minerals (OCUVITE-LUTEIN PO) Take 1 tablet by mouth daily.    [provider]  pantoprazole (PROTONIX) 40 MG tablet Take 1 tablet by mouth daily. 04/08/18   [provider]   spironolactone (ALDACTONE) 25 MG tablet Take 1 tablet by mouth daily.    [provider]  triamcinolone (KENALOG) 0.025 % ointment Apply to face twice daily for 3 days. Avoid ears and eyelids. 12/20/21   Moye, [place], MD  Ustekinumab Delsa Grana) 45 MG/0.5ML SOLN 41m subcutaneous every 8weeks 12/21/20   [provider]    Allergies:  Allergies as of 05/04/2022   (No Known Allergies)    ROS:  General: Denies weight loss, weight gain, fatigue, fevers, chills, and night sweats. Eyes: Denies blurry vision, double vision, eye pain, itchy eyes, and tearing. Ears: Denies hearing loss, earache, and ringing in ears. Nose: Denies sinus pain, congestion, infections, runny nose, and nosebleeds. Mouth/throat: Denies hoarseness, sore throat, bleeding gums, and difficulty swallowing. Heart: Denies chest pain, palpitations, racing heart, irregular heartbeat, leg pain or swelling, and decreased activity tolerance. Respiratory: Denies breathing difficulty, shortness of breath, wheezing, cough, and sputum. GI: Denies change in appetite, heartburn, constipation, diarrhea, and blood in stool. GU: Denies difficulty urinating, pain with urinating, urgency, frequency, blood in urine. Musculoskeletal: Denies joint stiffness, pain, swelling, muscle weakness. Skin: Denies rash, itching, mass, tumors, sores, and boils Neurologic: Denies headache, fainting, dizziness, seizures, numbness, and tingling. Psychiatric: Denies depression, anxiety, difficulty sleeping, and memory loss. Endocrine: Denies heat or cold intolerance, and increased thirst or urination. Blood/lymph: Denies easy bruising, easy bruising, and swollen glands     Objective:     BP (!) 104/45   Pulse (!) 58  Temp 97.6 F (36.4 C) (Oral)   Resp 18   Wt 72.6 kg   SpO2 98%   BMI 25.82 kg/m   Constitutional :  alert, cooperative, appears stated age, and no distress  Lymphatics/Throat:  no asymmetry, masses, or scars   Respiratory:  clear to auscultation bilaterally  Cardiovascular:  regular rate and rhythm, S1, S2 normal, no murmur, click, rub or gallop  Gastrointestinal: Soft, no guarding, minor focal TTP suprarumbilical area, productive colostomy with pink, viable mucosa noted. .   Musculoskeletal: Steady movement  Skin: Cool and moist, midline surgical scars. No discharge, induration  Psychiatric: Normal affect, non-agitated, not confused       LABS:     Latest Ref Rng & Units 05/04/2022   12:17 PM 09/20/2020   10:33 PM  CMP  Glucose 70 - 99 mg/dL 168   145    BUN 8 - 23 mg/dL 32   17    Creatinine 0.61 - 1.24 mg/dL 1.71   1.08    Sodium 135 - 145 mmol/L 140   141    Potassium 3.5 - 5.1 mmol/L 4.0   3.8    Chloride 98 - 111 mmol/L 112   107    CO2 22 - 32 mmol/L 21   24    Calcium 8.9 - 10.3 mg/dL 8.3   8.6    Total Protein 6.5 - 8.1 g/dL 7.2   7.4    Total Bilirubin 0.3 - 1.2 mg/dL 1.7   0.6    Alkaline Phos 38 - 126 U/L 220   154    AST 15 - 41 U/L 56   32    ALT 0 - 44 U/L 33   25        Latest Ref Rng & Units 05/04/2022   12:17 PM 09/20/2020   10:33 PM  CBC  WBC 4.0 - 10.5 K/uL 25.5   13.6    Hemoglobin 13.0 - 17.0 g/dL 13.7   12.7    Hematocrit 39.0 - 52.0 % 40.8   36.9    Platelets 150 - 400 K/uL 323   287      RADS: CLINICAL DATA:  Evaluate for bowel obstruction.   EXAM: CT ABDOMEN AND PELVIS WITH CONTRAST   TECHNIQUE: Multidetector CT imaging of the abdomen and pelvis was performed using the standard protocol following bolus administration of intravenous contrast.   RADIATION DOSE REDUCTION: This exam was performed according to the departmental dose-optimization program which includes automated exposure control, adjustment of the mA and/or kV according to patient size and/or use of iterative reconstruction technique.   CONTRAST:  73m OMNIPAQUE IOHEXOL 300 MG/ML  SOLN   COMPARISON:  09/21/2020   FINDINGS: Lower chest: No acute abnormality.   Hepatobiliary:  Several low-density foci within the liver are identified within a background of hepatic steatosis. These appear similar to the previous exam measuring up to 1 cm and are favored to represent a benign process. Gallbladder appears surgically absent. Common bile duct is mildly increased in caliber measuring 8 mm, unchanged from the previous exam.   Pancreas: Unchanged appearance of low-density structure within distal tail of the pancreas which is favored to represent a benign cystic lesion measuring 0.8 cm, image 49/5. No signs of pancreatic inflammation or solid enhancing mass.   Spleen: Normal in size without focal abnormality.   Adrenals/Urinary Tract: Normal adrenal glands. Bilateral nephrolithiasis. Unchanged appearance of fragmented stone within the upper pole collecting system of the left kidney measuring 1.7  cm, image 42/2. Stone within the inferior pole of the right kidney measures 1.2 cm, image 47/2. Benign appearing Bosniak class 2 cyst arising off the anterior cortex of the interpolar right kidney measures 2 cm and appears unchanged from the previous exam. No follow-up recommended. No signs of hydronephrosis. No hydroureter or ureteral calculi.   Stomach/Bowel: Stomach appears normal.   Postoperative changes from right hemicolectomy and enterocolonic anastomosis identified. 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   Multiple abnormally dilated loops of small bowel are identified with air-fluid levels. The small bowel loops measure up to 3.8 cm. A transition from dilated to decompressed distal small bowel is noted within the ventral aspect of the right hemiabdomen proximal to the enterocolonic anastomosis, image 46/2.   A fluid and gas collection between the dilated small bowel loops and the undersurface of the ventral abdominal wall is noted, image 55/2. This measures 9.7 cm transverse by 0.8 cm AP by 3.5 cm cranial caudal. The presence of gas  suggests fistulous communication with small bowel.   Vascular/Lymphatic: Aortic atherosclerosis without aneurysm. No signs of abdominopelvic adenopathy.   Reproductive: Prostate is unremarkable.   Other: No free fluid identified within the abdomen or pelvis. No signs of free intraperitoneal air. Fat containing inguinal hernias noted.   Musculoskeletal: Within the subcutaneous fat within the right inguinal region there is a 1.7 cm well-circumscribed low-density structure which is favored to represent a benign epidermal inclusion cyst. No acute osseous findings identified. 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   IMPRESSION: 1. Examination is positive for small bowel obstruction with transition point within the ventral aspect of the right hemiabdomen proximal to the enterocolonic anastomosis. Dilated small bowel loops are centrally located along the undersurface of the ventral abdominal wall which is suspicious for extensive adhesive disease. 2. Thin fluid collection between the dilated small bowel loops and the undersurface of the ventral abdominal wall contains foci of gas the presence of gas suggests fistulous communication with small bowel. This may be a chronic abnormality exacerbated by presence of bowel obstruction. 3. Bilateral nephrolithiasis. 4. Hepatic steatosis. 5. Aortic Atherosclerosis (ICD10-I70.0).     Electronically Signed   By: Kerby Moors M.D.   On: 05/04/2022 14:06 Assessment:   SBO, and also with new thin fluid collection with foci of gas, possibly due to local contained perforation within adhesions between bowel and abdominal wall from previous surgeries  Plan:   Stable with reassuring abdominal exam, so no immediate surgical intervention needed at this time, but will need to assess if he still has persistent perforation.  Ordered SBFT.  Recommend staying NPO, IVF resuscitation for his increased Cr, and also ensure he  is not having another Crohn's flare as the instigating factor for most recent SBO and possible perforation.  The patient verbalized understanding and all questions were answered to the patient's satisfaction.  labs/images/medications/previous chart entries reviewed personally and relevant changes/updates noted above.

## 2022-05-04 NOTE — ED Notes (Signed)
RN sent down green, purple, blue and grey tubes.

## 2022-05-04 NOTE — H&P (Signed)
Subjective:   CC: SBO, intra-abdominal fluid  HPI:  Dean Ryan is a 81 y.o. male who was consulted by [facility] for issue above.  Symptoms were first noted 1 week ago. Pain is sharp, abdominal, worse around periumbilical area.  Associated with N/V, exacerbated by nothing.  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  Currently on stelara.   Last stool noted in bag this am.   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  Past Surgical History:  Past Surgical History:  Procedure Laterality Date   COLOSTOMY  2022    Family History: reviewed and not relevant to CC  Social History:  reports that he quit smoking about 38 years ago. His smoking use included cigarettes. He has never used smokeless tobacco. He reports current alcohol use. He reports that he does not currently use drugs.  Current Medications:  Prior to Admission medications   Medication Sig Start Date End Date Taking? Authorizing Provider  Cholecalciferol 25 MCG (1000 UT) capsule Take 1,000 Units by mouth daily.     [provider]  furosemide (LASIX) 40 MG tablet Take 40 mg by mouth daily.     [provider]  loratadine (CLARITIN) 10 MG tablet Take 10 mg by mouth daily.     [provider]  losartan (COZAAR) 100 MG tablet Take 100 mg by mouth daily.  11/25/17   [provider]  mesalamine (APRISO) 0.375 g 24 hr capsule Take 4 capsules by mouth daily.    [provider]  metoprolol succinate (TOPROL-XL) 50 MG 24 hr tablet Take 1 tablet by mouth daily. 10/23/17   [provider]  Multiple Vitamins-Minerals (OCUVITE-LUTEIN PO) Take 1 tablet by mouth daily.    [provider]  pantoprazole (PROTONIX) 40 MG tablet Take 1 tablet by mouth daily. 04/08/18   [provider]   spironolactone (ALDACTONE) 25 MG tablet Take 1 tablet by mouth daily.    [provider]  triamcinolone (KENALOG) 0.025 % ointment Apply to face twice daily for 3 days. Avoid ears and eyelids. 12/20/21   Moye, [place], MD  Ustekinumab Delsa Grana) 45 MG/0.5ML SOLN 12m subcutaneous every 8weeks 12/21/20   [provider]    Allergies:  Allergies as of 05/04/2022   (No Known Allergies)    ROS:  General: Denies weight loss, weight gain, fatigue, fevers, chills, and night sweats. Eyes: Denies blurry vision, double vision, eye pain, itchy eyes, and tearing. Ears: Denies hearing loss, earache, and ringing in ears. Nose: Denies sinus pain, congestion, infections, runny nose, and nosebleeds. Mouth/throat: Denies hoarseness, sore throat, bleeding gums, and difficulty swallowing. Heart: Denies chest pain, palpitations, racing heart, irregular heartbeat, leg pain or swelling, and decreased activity tolerance. Respiratory: Denies breathing difficulty, shortness of breath, wheezing, cough, and sputum. GI: Denies change in appetite, heartburn, constipation, diarrhea, and blood in stool. GU: Denies difficulty urinating, pain with urinating, urgency, frequency, blood in urine. Musculoskeletal: Denies joint stiffness, pain, swelling, muscle weakness. Skin: Denies rash, itching, mass, tumors, sores, and boils Neurologic: Denies headache, fainting, dizziness, seizures, numbness, and tingling. Psychiatric: Denies depression, anxiety, difficulty sleeping, and memory loss. Endocrine: Denies heat or cold intolerance, and increased thirst or urination. Blood/lymph: Denies easy bruising, easy bruising, and swollen glands     Objective:     BP (!) 104/45   Pulse (!) 58  Temp 97.6 F (36.4 C) (Oral)   Resp 18   Wt 72.6 kg   SpO2 98%   BMI 25.82 kg/m   Constitutional :  alert, cooperative, appears stated age, and no distress  Lymphatics/Throat:  no asymmetry, masses, or scars   Respiratory:  clear to auscultation bilaterally  Cardiovascular:  regular rate and rhythm, S1, S2 normal, no murmur, click, rub or gallop  Gastrointestinal: Soft, no guarding, minor focal TTP suprarumbilical area, productive colostomy with pink, viable mucosa noted. .   Musculoskeletal: Steady movement  Skin: Cool and moist, midline surgical scars. No discharge, induration  Psychiatric: Normal affect, non-agitated, not confused       LABS:     Latest Ref Rng & Units 05/04/2022   12:17 PM 09/20/2020   10:33 PM  CMP  Glucose 70 - 99 mg/dL 168   145    BUN 8 - 23 mg/dL 32   17    Creatinine 0.61 - 1.24 mg/dL 1.71   1.08    Sodium 135 - 145 mmol/L 140   141    Potassium 3.5 - 5.1 mmol/L 4.0   3.8    Chloride 98 - 111 mmol/L 112   107    CO2 22 - 32 mmol/L 21   24    Calcium 8.9 - 10.3 mg/dL 8.3   8.6    Total Protein 6.5 - 8.1 g/dL 7.2   7.4    Total Bilirubin 0.3 - 1.2 mg/dL 1.7   0.6    Alkaline Phos 38 - 126 U/L 220   154    AST 15 - 41 U/L 56   32    ALT 0 - 44 U/L 33   25        Latest Ref Rng & Units 05/04/2022   12:17 PM 09/20/2020   10:33 PM  CBC  WBC 4.0 - 10.5 K/uL 25.5   13.6    Hemoglobin 13.0 - 17.0 g/dL 13.7   12.7    Hematocrit 39.0 - 52.0 % 40.8   36.9    Platelets 150 - 400 K/uL 323   287      RADS: CLINICAL DATA:  Evaluate for bowel obstruction.   EXAM: CT ABDOMEN AND PELVIS WITH CONTRAST   TECHNIQUE: Multidetector CT imaging of the abdomen and pelvis was performed using the standard protocol following bolus administration of intravenous contrast.   RADIATION DOSE REDUCTION: This exam was performed according to the departmental dose-optimization program which includes automated exposure control, adjustment of the mA and/or kV according to patient size and/or use of iterative reconstruction technique.   CONTRAST:  22m OMNIPAQUE IOHEXOL 300 MG/ML  SOLN   COMPARISON:  09/21/2020   FINDINGS: Lower chest: No acute abnormality.   Hepatobiliary:  Several low-density foci within the liver are identified within a background of hepatic steatosis. These appear similar to the previous exam measuring up to 1 cm and are favored to represent a benign process. Gallbladder appears surgically absent. Common bile duct is mildly increased in caliber measuring 8 mm, unchanged from the previous exam.   Pancreas: Unchanged appearance of low-density structure within distal tail of the pancreas which is favored to represent a benign cystic lesion measuring 0.8 cm, image 49/5. No signs of pancreatic inflammation or solid enhancing mass.   Spleen: Normal in size without focal abnormality.   Adrenals/Urinary Tract: Normal adrenal glands. Bilateral nephrolithiasis. Unchanged appearance of fragmented stone within the upper pole collecting system of the left kidney measuring 1.7  cm, image 42/2. Stone within the inferior pole of the right kidney measures 1.2 cm, image 47/2. Benign appearing Bosniak class 2 cyst arising off the anterior cortex of the interpolar right kidney measures 2 cm and appears unchanged from the previous exam. No follow-up recommended. No signs of hydronephrosis. No hydroureter or ureteral calculi.   Stomach/Bowel: Stomach appears normal.   Postoperative changes from right hemicolectomy and enterocolonic anastomosis identified. 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   Multiple abnormally dilated loops of small bowel are identified with air-fluid levels. The small bowel loops measure up to 3.8 cm. A transition from dilated to decompressed distal small bowel is noted within the ventral aspect of the right hemiabdomen proximal to the enterocolonic anastomosis, image 46/2.   A fluid and gas collection between the dilated small bowel loops and the undersurface of the ventral abdominal wall is noted, image 55/2. This measures 9.7 cm transverse by 0.8 cm AP by 3.5 cm cranial caudal. The presence of gas  suggests fistulous communication with small bowel.   Vascular/Lymphatic: Aortic atherosclerosis without aneurysm. No signs of abdominopelvic adenopathy.   Reproductive: Prostate is unremarkable.   Other: No free fluid identified within the abdomen or pelvis. No signs of free intraperitoneal air. Fat containing inguinal hernias noted.   Musculoskeletal: Within the subcutaneous fat within the right inguinal region there is a 1.7 cm well-circumscribed low-density structure which is favored to represent a benign epidermal inclusion cyst. No acute osseous findings identified. 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   IMPRESSION: 1. Examination is positive for small bowel obstruction with transition point within the ventral aspect of the right hemiabdomen proximal to the enterocolonic anastomosis. Dilated small bowel loops are centrally located along the undersurface of the ventral abdominal wall which is suspicious for extensive adhesive disease. 2. Thin fluid collection between the dilated small bowel loops and the undersurface of the ventral abdominal wall contains foci of gas the presence of gas suggests fistulous communication with small bowel. This may be a chronic abnormality exacerbated by presence of bowel obstruction. 3. Bilateral nephrolithiasis. 4. Hepatic steatosis. 5. Aortic Atherosclerosis (ICD10-I70.0).     Electronically Signed   By: Kerby Moors M.D.   On: 05/04/2022 14:06 Assessment:   SBO, and also with new thin fluid collection with foci of gas, possibly due to local contained perforation within adhesions between bowel and abdominal wall from previous surgeries  Plan:   Stable with reassuring abdominal exam, so no immediate surgical intervention needed at this time, but will need to assess if he still has persistent perforation.  Ordered SBFT.  Recommend staying NPO, IVF resuscitation for his increased Cr, and also ensure he  is not having another Crohn's flare as the instigating factor for most recent SBO and possible perforation.  The patient verbalized understanding and all questions were answered to the patient's satisfaction.  labs/images/medications/previous chart entries reviewed personally and relevant changes/updates noted above.

## 2022-05-04 NOTE — ED Notes (Signed)
Pt stating that they were given oral contrast by radiology tech.

## 2022-05-04 NOTE — ED Provider Notes (Signed)
[facility] Provider Note    Event Date/Time   First MD Initiated Contact with Patient 05/04/22 1214     (approximate)   History   Abdominal Pain   HPI  Dean Ryan is a 81 y.o. male   past medical history of Crohn's disease status post colectomy and ostomy with multiple prior SBO's who presents with abdominal pain.  Patient thinks that he has a bowel obstruction.  Says that he has had intermittent abdominal pain and thinks he had a bowel obstruction last week that he was able to resolve on his own however the pain has been coming and going over the last several days and is worse today and has had several episodes of vomiting last night and today.  No fevers or chills he has a colostomy has had slowed output from this.  Denies chest pain or shortness of breath.  Abdominal pain is generalized comes and goes.  Denies current nausea.     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   right cheek, Moh's 11/14/21   SCC (squamous cell carcinoma) 09/13/2021   left helix, [facility] 11/28/21   Small bowel obstruction ([place]) 11/2021    There are no problems to display for this patient.    Physical Exam  Triage Vital Signs: ED Triage Vitals  Enc Vitals Group     BP 05/04/22 1218 (!) 109/50     Pulse Rate 05/04/22 1217 63     Resp 05/04/22 1217 18     Temp 05/04/22 1218 97.6 F (36.4 C)     Temp Source 05/04/22 1218 Oral     SpO2 05/04/22 1217 98 %     Weight 05/04/22 1214 160 lb (72.6 kg)     Height --      Head Circumference --      Peak Flow --      Pain Score 05/04/22 1214 2     Pain Loc --      Pain Edu? --      Excl. in [place]? --     Most recent vital signs: Vitals:   05/04/22 1400 05/04/22 1500  BP: (!) 107/49 (!) 108/50  Pulse: 63 61  Resp: 19 17  Temp:    SpO2: 99% 99%     General: Awake, no distress.  CV:  Good peripheral perfusion.  Resp:  Normal effort.  Abd:  No distention.  Colostomy with pink  mucosa, scant brown liquid in the bag abdomen is soft but tender throughout, midline vertical surgical scar Neuro:             Awake, Alert, Oriented x 3  Other:  2+ lower extremity edema bilaterally   ED Results / Procedures / Treatments  Labs (all labs ordered are listed, but only abnormal results are displayed) Labs Reviewed  COMPREHENSIVE METABOLIC PANEL - Abnormal; Notable for the following components:      Result Value   Chloride 112 (*)    CO2 21 (*)    Glucose, Bld 168 (*)    BUN 32 (*)    Creatinine, Ser 1.71 (*)    Calcium 8.3 (*)    Albumin 2.7 (*)    AST 56 (*)    Alkaline Phosphatase 220 (*)    Total Bilirubin 1.7 (*)    GFR, Estimated 40 (*)    All other components within normal limits  CBC - Abnormal; Notable for the following components:  WBC 25.5 (*)    RBC 4.15 (*)    All other components within normal limits  CULTURE, BLOOD (ROUTINE X 2)  CULTURE, BLOOD (ROUTINE X 2)  LIPASE, BLOOD  URINALYSIS, ROUTINE W REFLEX MICROSCOPIC  LACTIC ACID, PLASMA  LACTIC ACID, PLASMA     EKG  EKG shows a left bundle branch block with PVC, no Sgarbossa criteria for ischemia   RADIOLOGY I reviewed the CT abdomen and interpreted which shows dilated loops of small bowel consistent with SBO   PROCEDURES:  Critical Care performed: No  Procedures  The patient is on the cardiac monitor to evaluate for evidence of arrhythmia and/or significant heart rate changes.   MEDICATIONS ORDERED IN ED: Medications  sodium chloride 0.9 % bolus 1,000 mL (1,000 mLs Intravenous New Bag/Given 05/04/22 1407)  ondansetron (ZOFRAN) injection 4 mg (4 mg Intravenous Given 05/04/22 1405)  morphine (PF) 4 MG/ML injection 4 mg (4 mg Intravenous Given 05/04/22 1406)  iohexol (OMNIPAQUE) 300 MG/ML solution 80 mL (75 mLs Intravenous Contrast Given 05/04/22 1347)  sodium chloride 0.9 % bolus 1,000 mL (1,000 mLs Intravenous New Bag/Given 05/04/22 1503)     IMPRESSION / MDM / ASSESSMENT AND PLAN  / ED COURSE  I reviewed the triage vital signs and the nursing notes.                              Differential diagnosis includes, but is not limited to SBO, Crohn's flare, bowel perforation, fistula   Patient is a 81 year old male with complex medical history including history of Crohn's disease on Stelara status post colectomy and multiple prior SBO's with extensive adhesions presents with abdominal pain and vomiting.  He has complex past medical history with Crohn's multiple SBO's and prior colectomy.  Appears nontoxic on exam.  He is diffusely tender on abdominal exam there is some output in his.  Labs are notable for leukocytosis of 25 mild AKI creatinine 1.7 from baseline of around 1.  He does have some peripheral edema as well.  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  We will give a liter of fluids and obtain a CT abdomen pelvis treat pain with IV morphine and nausea with Zofran.  CT abdomen pelvis is consistent with SBO with a transition point.  Discussed with Dr. Lysle Pearl with general surgery who will see the patient.  He would like admission to medicine.  May need GI consult for evaluation of possible Crohn's flare.  FINAL CLINICAL IMPRESSION(S) / ED DIAGNOSES   Final diagnoses:  SBO (small bowel obstruction) ([place])     Rx / DC Orders   ED Discharge Orders     None        Note:  This document was prepared using Dragon voice recognition software and may include unintentional dictation errors.   Rada Hay, MD 05/04/22 [phone]

## 2022-05-04 NOTE — ED Notes (Signed)
Pt stating that he does not wish to have NG tube placed for SBO. Pt stating that they have have dozens of ng tubes and would rather deal with the discomfort, and vomit PRN

## 2022-05-04 NOTE — ED Triage Notes (Signed)
Pt comes [facility] from home with abd pain/n/v. Hx of SBO. Last BM "days". Has a colostomy. Hasn't been able to keep anything down since about 530 this morning. Hx of crohns. 27m NS on board with EMS. Was hypotensive now 1867systolic after fluids. CBG 73.

## 2022-05-05 ENCOUNTER — Inpatient Hospital Stay: Payer: Medicare Other

## 2022-05-05 LAB — BASIC METABOLIC PANEL
Anion gap: 4 — ABNORMAL LOW (ref 5–15)
BUN: 34 mg/dL — ABNORMAL HIGH (ref 8–23)
CO2: 22 mmol/L (ref 22–32)
Calcium: 7.8 mg/dL — ABNORMAL LOW (ref 8.9–10.3)
Chloride: 117 mmol/L — ABNORMAL HIGH (ref 98–111)
Creatinine, Ser: 1.66 mg/dL — ABNORMAL HIGH (ref 0.61–1.24)
GFR, Estimated: 42 mL/min — ABNORMAL LOW (ref >60)
Glucose, Bld: 118 mg/dL — ABNORMAL HIGH (ref 70–99)
Potassium: 4.4 mmol/L (ref 3.5–5.1)
Sodium: 143 mmol/L (ref 135–145)

## 2022-05-05 LAB — CBC
HCT: 35 % — ABNORMAL LOW (ref 39.0–52.0)
Hemoglobin: 11.7 g/dL — ABNORMAL LOW (ref 13.0–17.0)
MCH: 33 pg (ref 26.0–34.0)
MCHC: 33.4 g/dL (ref 30.0–36.0)
MCV: 98.6 fL (ref 80.0–100.0)
Platelets: 249 10*3/uL (ref 150–400)
RBC: 3.55 MIL/uL — ABNORMAL LOW (ref 4.22–5.81)
RDW: 14 % (ref 11.5–15.5)
WBC: 12.3 10*3/uL — ABNORMAL HIGH (ref 4.0–10.5)
nRBC: 0 % (ref 0.0–0.2)

## 2022-05-05 LAB — C-REACTIVE PROTEIN: CRP: 4.7 mg/dL — ABNORMAL HIGH (ref <1.0)

## 2022-05-05 LAB — SEDIMENTATION RATE: Sed Rate: 45 mm/hr — ABNORMAL HIGH (ref 0–20)

## 2022-05-05 IMAGING — DX DG ABD PORTABLE 1V
2 series · 2 of 2 positions shown · non-contrast
Comparison: CT today

CLINICAL DATA: 8 hour delay film.  Small bowel obstruction

EXAM:
PORTABLE ABDOMEN - 1 VIEW

[abdomen supine (1 of 2)]
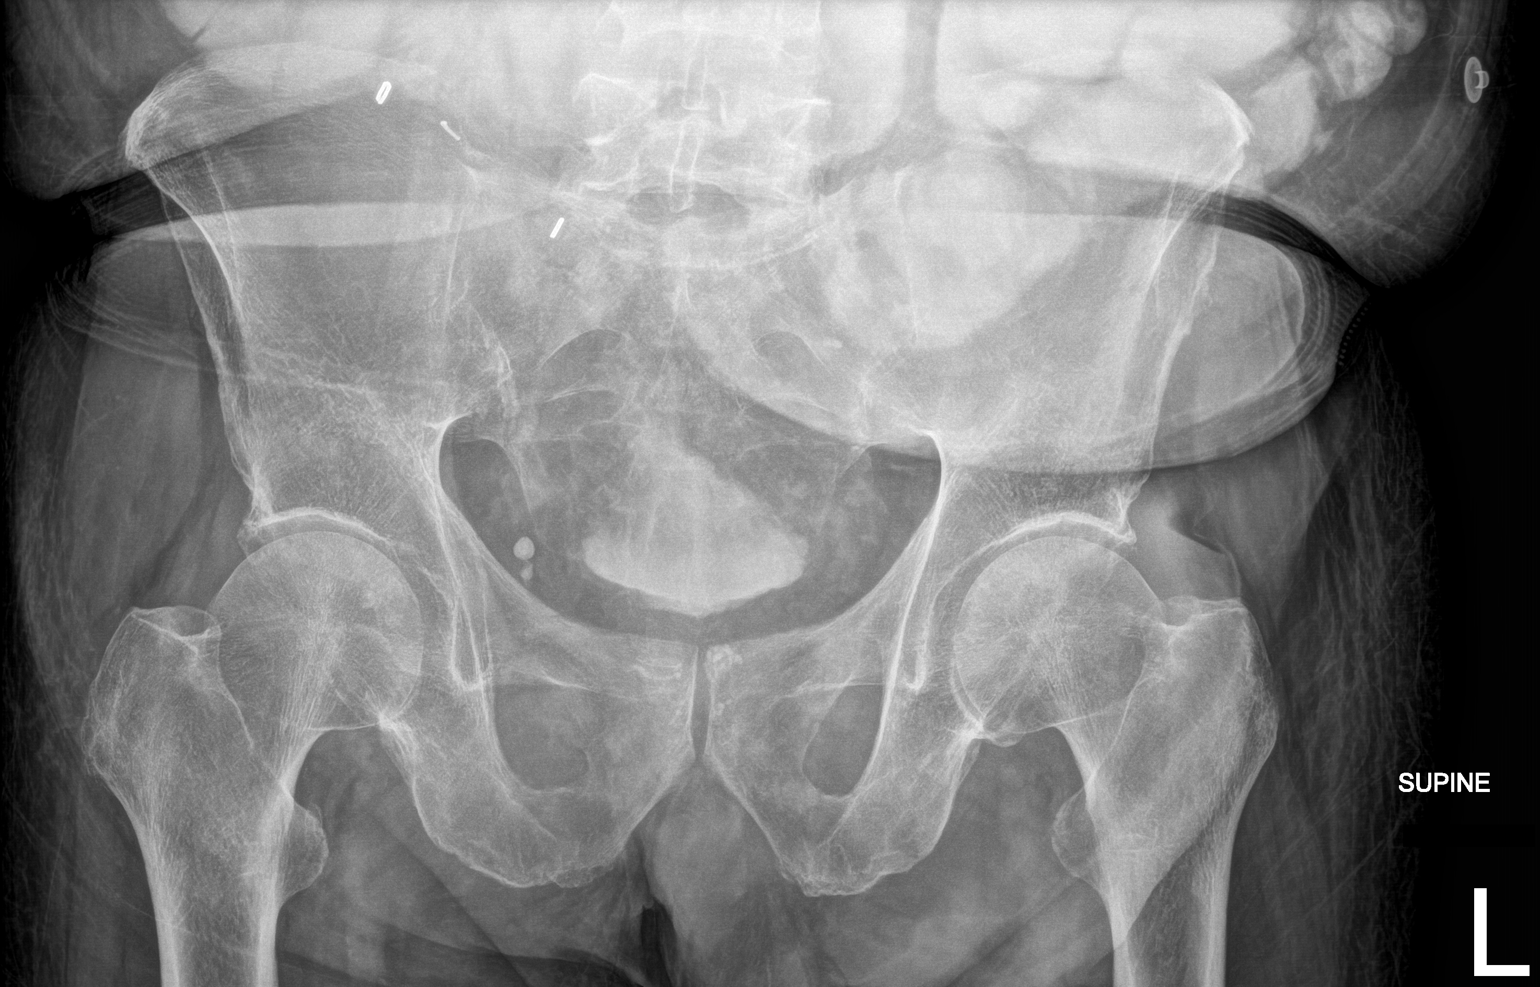

[abdomen supine (2 of 2)]
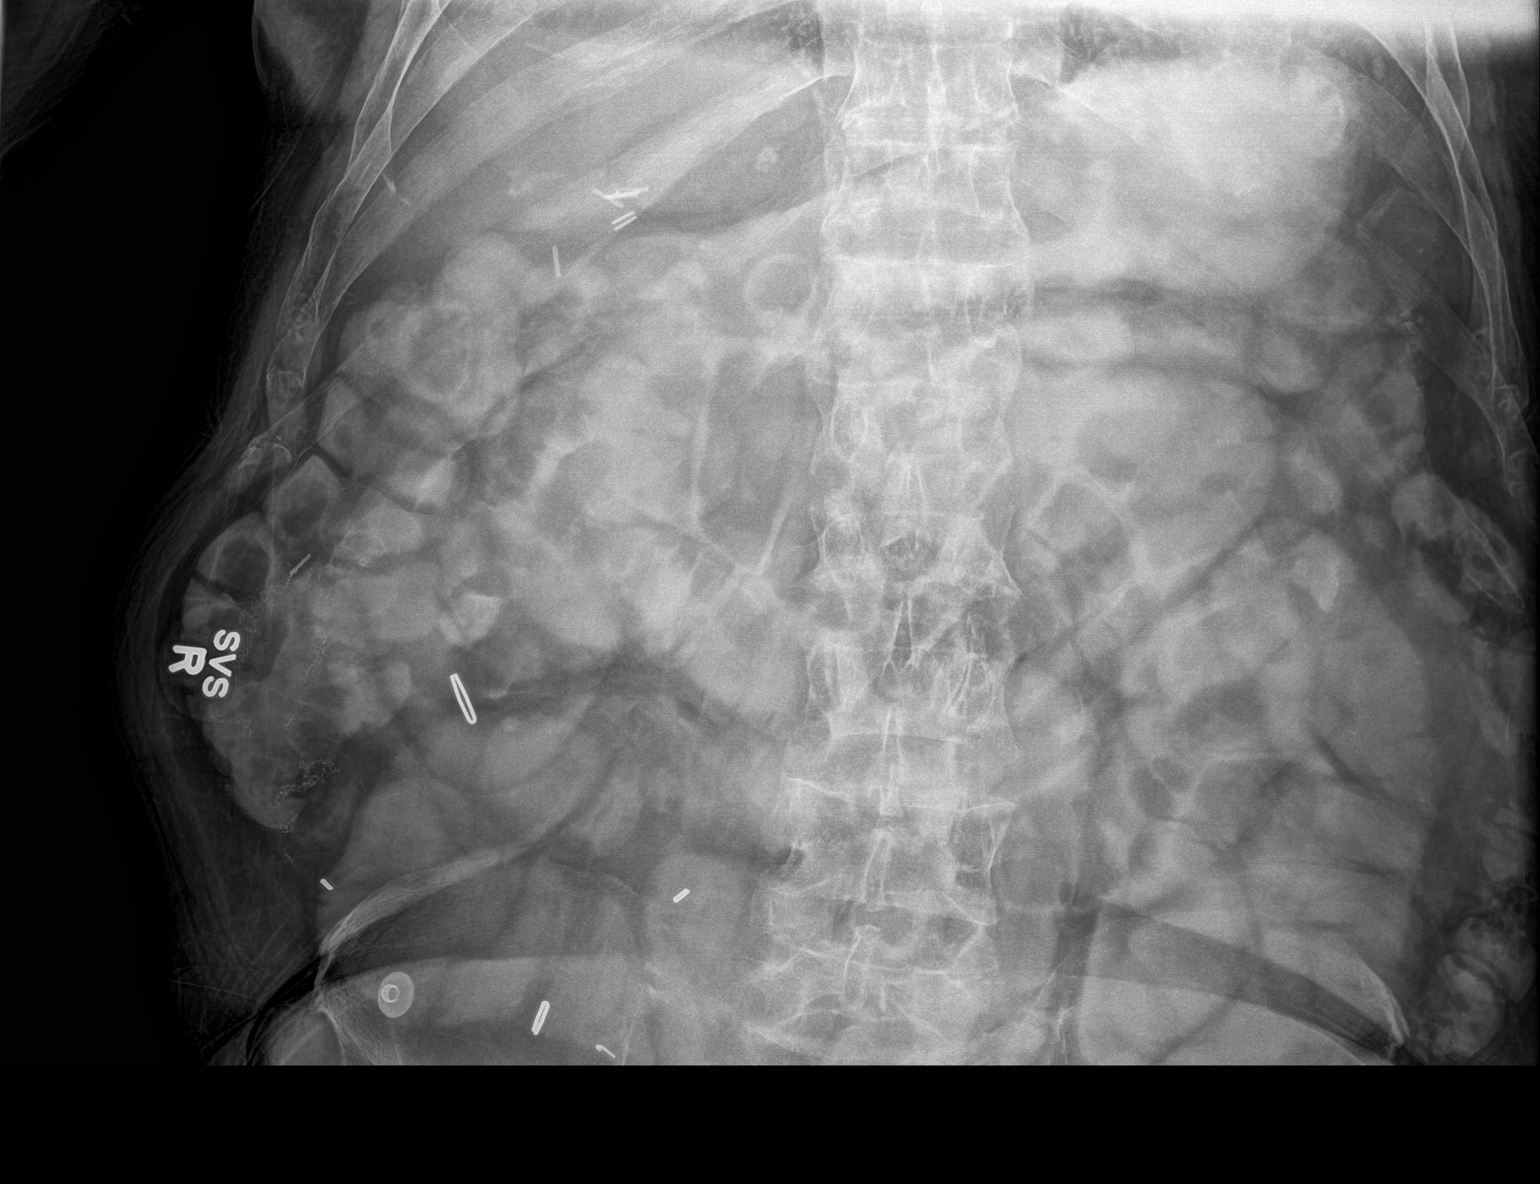

[2 of 2 positions shown; findings below may reference images not displayed]

FINDINGS: Contrast material is seen within prominent small bowel loops as well
as the colon. No free air organomegaly. Prior cholecystectomy.
IMPRESSION: Contrast has passed into the colon compatible with partial small
bowel obstruction.

## 2022-05-05 NOTE — Consult Note (Signed)
Inpatient Consultation   Patient ID: Dean Ryan is a 81 y.o. male.  Requesting Provider: Dr. 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  Patient currently on Stelara which she administered a few days ago.  Imaging performed upon hospitalization demonstrated partial small bowel obstruction.  He noticed over the last week he was having increased abdominal pain and distention and decreased ostomy output.  He adjusted his diet with some improvement but then upon eating nuts worsened symptoms.  He denies any blood in his stool or melena.  Since his hospitalization his ostomy output has increased liquid brown stool and gas.  CT was performed concerning for question of fistula near stenosis proximal to anastomosis.  There is also a question of a contained perforation for which the patient is now on antibiotics per primary team.  He was hospitalized towards the end of 2022 for small bowel obstruction which resolved with conservative management. Hemoglobin on presentation 11.7 WBC 12.3 lactate has improved from 2.5 to 1.7 creatinine 1.66.  Blood cultures thus far negative and vital signs stable.  Denies NSAIDs, Anti-plt agents, and anticoagulants Denies family history of gastrointestinal disease and malignancy Previous Endoscopies:  Status post right hemicolectomy with ileocolonic anastomosis and left-sided end colostomy Multiple colonoscopies  Last reported 03/19/2018, colonoscopy. Patent end sigmoid colostomy characterized by erythema and  friable mucosa that was biopsied. Patent end-to-end ileocolonic anastomosis characterized by edema, erosions, erythema, friability. Normal ileum. "No obvious evidence for active Crohn's disease. 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 No pertinent family history.  Social History   Tobacco Use   Smoking status: Former    Types: Cigarettes    Quit date: 1985    Years since quitting: 38.4   Smokeless tobacco: Never  Substance Use Topics   Alcohol use: Yes   Drug use: Not Currently     Pertinent GI related history and allergies were reviewed with the patient  Review of Systems  Constitutional:  Positive for appetite change (diminished). Negative for activity change, chills, diaphoresis, fatigue, fever and unexpected weight change.  HENT:  Negative for trouble swallowing and voice change.   Respiratory:  Negative for shortness of breath and wheezing.   Cardiovascular:  Negative for chest pain, palpitations and leg swelling.  Gastrointestinal:  Positive for abdominal distention, abdominal pain, constipation (decreased ostomy output- improving), nausea and vomiting. Negative for anal bleeding, blood in stool and diarrhea.  Musculoskeletal:  Negative for arthralgias and myalgias.  Skin:  Negative for color change and pallor.  Neurological:  Negative for dizziness, syncope and weakness.  Psychiatric/Behavioral:  Negative for confusion. The patient is not nervous/anxious.   All other systems reviewed and are negative.   Medications Home Medications No current facility-administered medications on  file prior to encounter.   Current Outpatient Medications on File Prior to  Encounter  Medication Sig Dispense Refill   Cholecalciferol 25 MCG (1000 UT) capsule Take 1,000 Units by mouth daily.      furosemide (LASIX) 40 MG tablet Take 40 mg by mouth daily.      loratadine (CLARITIN) 10 MG tablet Take 10 mg by mouth daily.      losartan (COZAAR) 100 MG tablet Take 100 mg by mouth daily.      Magnesium Oxide (MAG-200 PO) Take 1 tablet by mouth daily.     metoprolol succinate (TOPROL-XL) 50 MG 24 hr tablet Take 1 tablet by mouth daily.     Multiple Vitamins-Minerals (OCUVITE-LUTEIN PO) Take 1 tablet by mouth daily.     Multiple Vitamins-Minerals (PRESERVISION AREDS 2 PO) Take 1 tablet by mouth daily.     pantoprazole (PROTONIX) 40 MG tablet Take 1 tablet by mouth daily.     triamcinolone (KENALOG) 0.025 % ointment Apply to face twice daily for 3 days. Avoid ears and eyelids. 15 g 0   Ustekinumab (STELARA) 45 MG/0.5ML SOLN 53m subcutaneous every 8weeks     mesalamine (APRISO) 0.375 g 24 hr capsule Take 4 capsules by mouth daily.     spironolactone (ALDACTONE) 25 MG tablet Take 1 tablet by mouth daily.     Pertinent GI related medications were reviewed with the patient  Inpatient Medications  Current Facility-Administered Medications:    docusate sodium (COLACE) capsule 100 mg, 100 mg, Oral, BID PRN, Sakai, Isami, DO   HYDROcodone-acetaminophen (NORCO/VICODIN) 5-325 MG per tablet 1-2 tablet, 1-2 tablet, Oral, Q4H PRN, Sakai, Isami, DO, 2 tablet at 05/04/22 2105   lactated ringers infusion, , Intravenous, Continuous, Sakai, Isami, DO, Last Rate: 100 mL/hr at 05/05/22 0755, New Bag at 05/05/22 0755   loratadine (CLARITIN) tablet 10 mg, 10 mg, Oral, Daily, Sakai, Isami, DO, 10 mg at 05/05/22 02244  losartan (COZAAR) tablet 100 mg, 100 mg, Oral, Daily, Sakai, Isami, DO, 100 mg at 05/05/22 09753  metoprolol succinate (TOPROL-XL) 24 hr tablet 50 mg, 50 mg, Oral, Daily, Sakai, Isami, DO, 50 mg at 05/05/22 00051  morphine (PF) 2 MG/ML injection 2 mg, 2 mg, Intravenous, Q4H  PRN, Sakai, Isami, DO, 2 mg at 05/05/22 0007   ondansetron (ZOFRAN-ODT) disintegrating tablet 4 mg, 4 mg, Oral, Q6H PRN **OR** ondansetron (ZOFRAN) injection 4 mg, 4 mg, Intravenous, Q6H PRN, Sakai, Isami, DO, 4 mg at 05/04/22 2104   pantoprazole (PROTONIX) EC tablet 40 mg, 40 mg, Oral, Daily, Sakai, Isami, DO, 40 mg at 05/05/22 0952   piperacillin-tazobactam (ZOSYN) IVPB 3.375 g, 3.375 g, Intravenous, Q8H, MDarrick Penna RPH, Last Rate: 12.5 mL/hr at 05/05/22 0801, 3.375 g at 05/05/22 0801   traMADol (ULTRAM) tablet 50 mg, 50 mg, Oral, Q6H PRN, SLysle Pearl Isami, DO  lactated ringers 100 mL/hr at 05/05/22 0755   piperacillin-tazobactam (ZOSYN)  IV 3.375 g (05/05/22 0801)    docusate sodium, HYDROcodone-acetaminophen, morphine injection, ondansetron **OR** ondansetron (ZOFRAN) IV, traMADol   Objective   Vitals:   05/04/22 1714 05/04/22 1832 05/05/22 0426 05/05/22 0730  BP:  (!) 110/51 (!) 94/53 (!) 101/58  Pulse:  (!) 59 (!) 57   Resp:  14 16   Temp: 98.2 F (36.8 C) 97.7 F (36.5 C) 97.7 F (36.5 C) 97.6 F (36.4 C)  TempSrc: Oral Oral Oral Oral  SpO2:  100% 97% 99%  Weight:      Height:  Physical Exam Vitals and nursing note reviewed.  Constitutional:      General: He is not in acute distress.    Appearance: He is not toxic-appearing or diaphoretic.  HENT:     Head: Normocephalic and atraumatic.     Nose: Nose normal.     Mouth/Throat:     Mouth: Mucous membranes are moist.     Pharynx: Oropharynx is clear.  Eyes:     General: No scleral icterus.    Extraocular Movements: Extraocular movements intact.  Cardiovascular:     Rate and Rhythm: Regular rhythm. Bradycardia present.     Heart sounds: Normal heart sounds. No murmur heard.   No friction rub. No gallop.  Pulmonary:     Effort: Pulmonary effort is normal. No respiratory distress.     Breath sounds: Normal breath sounds. No wheezing, rhonchi or rales.  Abdominal:     General: There is no distension.      Palpations: Abdomen is soft.     Tenderness: There is abdominal tenderness (mild near scar). There is no guarding or rebound.     Comments: No signs of fistula or drainage. Ostomy with liquid brown stool and gas in bag. Ostomy itself appears health with pink mucosa. No signs of blood or melena  Musculoskeletal:     Cervical back: Neck supple.     Right lower leg: No edema.     Left lower leg: No edema.  Skin:    General: Skin is warm and dry.     Coloration: Skin is not jaundiced or pale.  Neurological:     General: No focal deficit present.     Mental Status: He is alert and oriented to person, place, and time. Mental status is at baseline.  Psychiatric:        Mood and Affect: Mood normal.        Behavior: Behavior normal.        Thought Content: Thought content normal.        Judgment: Judgment normal.    Laboratory Data Recent Labs  Lab 05/04/22 1217 05/05/22 0459  WBC 25.5* 12.3*  HGB 13.7 11.7*  HCT 40.8 35.0*  PLT 323 249   Recent Labs  Lab 05/04/22 1217 05/05/22 0459  NA 140 143  K 4.0 4.4  CL 112* 117*  CO2 21* 22  BUN 32* 34*  CALCIUM 8.3* 7.8*  PROT 7.2  --   BILITOT 1.7*  --   ALKPHOS 220*  --   ALT 33  --   AST 56*  --   GLUCOSE 168* 118*   No results for input(s): INR in the last 168 hours.  Recent Labs    05/04/22 1217  LIPASE 45        Imaging Studies: CT ABDOMEN PELVIS W CONTRAST  Result Date: 05/04/2022 CLINICAL DATA:  Evaluate for bowel obstruction. EXAM: CT ABDOMEN AND PELVIS WITH CONTRAST TECHNIQUE: Multidetector CT imaging of the abdomen and pelvis was performed using the standard protocol following bolus administration of intravenous contrast. RADIATION DOSE REDUCTION: This exam was performed according to the departmental dose-optimization program which includes automated exposure control, adjustment of the mA and/or kV according to patient size and/or use of iterative reconstruction technique. CONTRAST:  30m OMNIPAQUE IOHEXOL 300  MG/ML  SOLN COMPARISON:  09/21/2020 FINDINGS: Lower chest: No acute abnormality. Hepatobiliary: Several low-density foci within the liver are identified within a background of hepatic steatosis. These appear similar to the previous exam measuring up to 1 cm  and are favored to represent a benign process. Gallbladder appears surgically absent. Common bile duct is mildly increased in caliber measuring 8 mm, unchanged from the previous exam. Pancreas: Unchanged appearance of low-density structure within distal tail of the pancreas which is favored to represent a benign cystic lesion measuring 0.8 cm, image 49/5. No signs of pancreatic inflammation or solid enhancing mass. Spleen: Normal in size without focal abnormality. Adrenals/Urinary Tract: Normal adrenal glands. Bilateral nephrolithiasis. Unchanged appearance of fragmented stone within the upper pole collecting system of the left kidney measuring 1.7 cm, image 42/2. Stone within the inferior pole of the right kidney measures 1.2 cm, image 47/2. Benign appearing Bosniak class 2 cyst arising off the anterior cortex of the interpolar right kidney measures 2 cm and appears unchanged from the previous exam. No follow-up recommended. No signs of hydronephrosis. No hydroureter or ureteral calculi. Stomach/Bowel: Stomach appears normal. Postoperative changes from right hemicolectomy and enterocolonic anastomosis identified. 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 Multiple abnormally dilated loops of small bowel are identified with air-fluid levels. The small bowel loops measure up to 3.8 cm. A transition from dilated to decompressed distal small bowel is noted within the ventral aspect of the right hemiabdomen proximal to the enterocolonic anastomosis, image 46/2. A fluid and gas collection between the dilated small bowel loops and the undersurface of the ventral abdominal wall is noted, image 55/2. This measures 9.7 cm transverse by 0.8 cm AP by  3.5 cm cranial caudal. The presence of gas suggests fistulous communication with small bowel. Vascular/Lymphatic: Aortic atherosclerosis without aneurysm. No signs of abdominopelvic adenopathy. Reproductive: Prostate is unremarkable. Other: No free fluid identified within the abdomen or pelvis. No signs of free intraperitoneal air. Fat containing inguinal hernias noted. Musculoskeletal: Within the subcutaneous fat within the right inguinal region there is a 1.7 cm well-circumscribed low-density structure which is favored to represent a benign epidermal inclusion cyst. No acute osseous findings identified. 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 IMPRESSION: 1. Examination is positive for small bowel obstruction with transition point within the ventral aspect of the right hemiabdomen proximal to the enterocolonic anastomosis. Dilated small bowel loops are centrally located along the undersurface of the ventral abdominal wall which is suspicious for extensive adhesive disease. 2. Thin fluid collection between the dilated small bowel loops and the undersurface of the ventral abdominal wall contains foci of gas the presence of gas suggests fistulous communication with small bowel. This may be a chronic abnormality exacerbated by presence of bowel obstruction. 3. Bilateral nephrolithiasis. 4. Hepatic steatosis. 5. Aortic Atherosclerosis (ICD10-I70.0). Electronically Signed   By: Kerby Moors M.D.   On: 05/04/2022 14:06   DG Abd Portable 1V-Small Bowel Obstruction Protocol-initial, 8 hr delay  Result Date: 05/05/2022 CLINICAL DATA:  8 hour delay film.  Small bowel obstruction EXAM: PORTABLE ABDOMEN - 1 VIEW COMPARISON:  CT today FINDINGS: Contrast material is seen within prominent small bowel loops as well as the colon. No free air organomegaly. Prior cholecystectomy. IMPRESSION: Contrast has passed into the colon compatible with partial small bowel obstruction. Electronically Signed    By: Rolm Baptise M.D.   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 - 50+ years - Status post right hemicolectomy with ileocolonic anastomosis and left-sided end colostomy surgical scars well healed  - has had multiple small bowel resections and adhesion take down operations - concern for contained perf and fistulization on  CT exam - on stelara q8w - has been on multiple other agents and had multiple abdominal surgeries - some abdominal discomfort currently but no other EIM or blood in the stool  #  PSBO - demonstrated on imaging; h/o sbo - had decreased stool output of ostomy which is improving along with passing gas from ostomy  Plan:  Check esr, crp, fecal cal Suspect chronic fibrosis over acute inflammation as etiology General Surgery is following Ostomy output is improving. On abx as per primary team Tolerating clear liquids at this time Would avoid steroids at this time as pt has been historic non responder/refractory disease (per pt) and in presence of possible contained perforation Has continued f/u with his IBD Gastroenterologist in July  No current indication for medical change or endoscopic intervention at this time. GI to sign off. Available as needed. Please do not hesitate to call regarding questions or concerns. D/w surgeon  I personally performed the service.  Management of other medical comorbidities as per primary team  Thank you for allowing [place] to participate in this patient's care.   Annamaria Helling, DO [facility]  Portions of the record may have been created with voice recognition software. Occasional wrong-word or 'sound-a-like' substitutions may have occurred due to the inherent limitations of voice recognition software.  Read the chart carefully and recognize, using context, where substitutions may have occurred.

## 2022-05-05 NOTE — Progress Notes (Signed)
Subjective:  CC: Dean Ryan is a 81 y.o. male  Hospital stay day 1,   SBO, ?bowel perforation?  HPI: No issues overnight.  Having more ostomy output, no worsening pain  ROS:  General: Denies weight loss, weight gain, fatigue, fevers, chills, and night sweats. Heart: Denies chest pain, palpitations, racing heart, irregular heartbeat, leg pain or swelling, and decreased activity tolerance. Respiratory: Denies breathing difficulty, shortness of breath, wheezing, cough, and sputum. GI: Denies change in appetite, heartburn, nausea, vomiting, constipation, diarrhea, and blood in stool. GU: Denies difficulty urinating, pain with urinating, urgency, frequency, blood in urine.   Objective:   Temp:  [97.6 F (36.4 C)-98.2 F (36.8 C)] 97.6 F (36.4 C) (05/28 0730) Pulse Rate:  [57-63] 57 (05/28 0426) Resp:  [14-19] 16 (05/28 0426) BP: (94-110)/(45-58) 101/58 (05/28 0730) SpO2:  [94 %-100 %] 99 % (05/28 0730) Weight:  [72.6 kg] 72.6 kg (05/27 1214)     Height: 5' 5.98" (167.6 cm) (reported 04/30/22) Weight: 72.6 kg     Intake/Output this shift:   Intake/Output Summary (Last 24 hours) at 05/05/2022 1039 Last data filed at 05/05/2022 4680 Gross per 24 hour  Intake --  Output 150 ml  Net -150 ml    Constitutional :  alert, cooperative, appears stated age, and no distress  Respiratory:  clear to auscultation bilaterally  Cardiovascular:  regular rate and rhythm  Gastrointestinal: Soft, no guarding, minimal TTP midline incision, no overlying skin changes noted compared to yesterday , ostomy continues to be productive  Skin: Cool and moist.   Psychiatric: Normal affect, non-agitated, not confused       LABS:     Latest Ref Rng & Units 05/05/2022    4:59 AM 05/04/2022    7:10 PM 05/04/2022   12:17 PM  CMP  Glucose 70 - 99 mg/dL 118    168    BUN 8 - 23 mg/dL 34    32    Creatinine 0.61 - 1.24 mg/dL 1.66   1.50   1.71    Sodium 135 - 145 mmol/L 143    140    Potassium 3.5 - 5.1  mmol/L 4.4    4.0    Chloride 98 - 111 mmol/L 117    112    CO2 22 - 32 mmol/L 22    21    Calcium 8.9 - 10.3 mg/dL 7.8    8.3    Total Protein 6.5 - 8.1 g/dL   7.2    Total Bilirubin 0.3 - 1.2 mg/dL   1.7    Alkaline Phos 38 - 126 U/L   220    AST 15 - 41 U/L   56    ALT 0 - 44 U/L   33        Latest Ref Rng & Units 05/05/2022    4:59 AM 05/04/2022    7:10 PM 05/04/2022   12:17 PM  CBC  WBC 4.0 - 10.5 K/uL 12.3   15.7   25.5    Hemoglobin 13.0 - 17.0 g/dL 11.7   13.1   13.7    Hematocrit 39.0 - 52.0 % 35.0   38.5   40.8    Platelets 150 - 400 K/uL 249   282   323      RADS: CLINICAL DATA:  8 hour delay film.  Small bowel obstruction   EXAM: PORTABLE ABDOMEN - 1 VIEW   COMPARISON:  CT today   FINDINGS: Contrast material is seen within  prominent small bowel loops as well as the colon. No free air organomegaly. Prior cholecystectomy.   IMPRESSION: Contrast has passed into the colon compatible with partial small bowel obstruction.     Electronically Signed   By: Rolm Baptise M.D.   On: 05/05/2022 01:17 Assessment:   SBO- resolving based on imaging and increased output.  Will try clears to see if tolerates.  May consider repeat CT in a day or two to reassess the fluid collection noted on initial imaging.  Wbc and clinical signs all improving so reassuring.  AKI- increased Cr noted again.  Will continue with IVF, and monitor fluid overload.  Still holding lasix from home. Also will discontinue lovenox  labs/images/medications/previous chart entries reviewed personally and relevant changes/updates noted above.

## 2022-05-06 LAB — BASIC METABOLIC PANEL
Anion gap: 4 — ABNORMAL LOW (ref 5–15)
BUN: 27 mg/dL — ABNORMAL HIGH (ref 8–23)
CO2: 20 mmol/L — ABNORMAL LOW (ref 22–32)
Calcium: 7.7 mg/dL — ABNORMAL LOW (ref 8.9–10.3)
Chloride: 115 mmol/L — ABNORMAL HIGH (ref 98–111)
Creatinine, Ser: 1.45 mg/dL — ABNORMAL HIGH (ref 0.61–1.24)
GFR, Estimated: 49 mL/min — ABNORMAL LOW (ref >60)
Glucose, Bld: 82 mg/dL (ref 70–99)
Potassium: 3.8 mmol/L (ref 3.5–5.1)
Sodium: 139 mmol/L (ref 135–145)

## 2022-05-06 LAB — CBC
HCT: 33.5 % — ABNORMAL LOW (ref 39.0–52.0)
Hemoglobin: 11.5 g/dL — ABNORMAL LOW (ref 13.0–17.0)
MCH: 33.9 pg (ref 26.0–34.0)
MCHC: 34.3 g/dL (ref 30.0–36.0)
MCV: 98.8 fL (ref 80.0–100.0)
Platelets: 220 10*3/uL (ref 150–400)
RBC: 3.39 MIL/uL — ABNORMAL LOW (ref 4.22–5.81)
RDW: 14.1 % (ref 11.5–15.5)
WBC: 8.1 10*3/uL (ref 4.0–10.5)
nRBC: 0 % (ref 0.0–0.2)

## 2022-05-06 MED ORDER — DM-GUAIFENESIN ER 30-600 MG PO TB12
1.0000 | ORAL_TABLET | Freq: Two times a day (BID) | ORAL | Status: DC | PRN
  Administered 2022-05-06: 1 via ORAL
  Filled 2022-05-06: qty 1

## 2022-05-06 NOTE — Progress Notes (Signed)
Subjective:  CC: Dean Ryan is a 81 y.o. male  Hospital stay day 2,   SBO, ?bowel perforation?  HPI: No issues overnight.  Having more ostomy output, no worsening pain. Tolerating clears  ROS:  General: Denies weight loss, weight gain, fatigue, fevers, chills, and night sweats. Heart: Denies chest pain, palpitations, racing heart, irregular heartbeat, leg pain or swelling, and decreased activity tolerance. Respiratory: Denies breathing difficulty, shortness of breath, wheezing, cough, and sputum. GI: Denies change in appetite, heartburn, nausea, vomiting, constipation, diarrhea, and blood in stool. GU: Denies difficulty urinating, pain with urinating, urgency, frequency, blood in urine.   Objective:   Temp:  [97.8 F (36.6 C)-98 F (36.7 C)] 98 F (36.7 C) (05/29 0800) Pulse Rate:  [54-60] 54 (05/29 0800) Resp:  [16-20] 19 (05/29 0800) BP: (93-111)/(46-63) 108/46 (05/29 0800) SpO2:  [96 %-98 %] 96 % (05/29 0800)     Height: 5' 5.98" (167.6 cm) (reported 04/30/22) Weight: 72.6 kg     Intake/Output this shift:   Intake/Output Summary (Last 24 hours) at 05/06/2022 0847 Last data filed at 05/06/2022 0636 Gross per 24 hour  Intake 2721.91 ml  Output 2075 ml  Net 646.91 ml    Constitutional :  alert, cooperative, appears stated age, and no distress  Respiratory:  clear to auscultation bilaterally  Cardiovascular:  regular rate and rhythm  Gastrointestinal: Soft, no guarding, no TTP midline incision, no overlying skin changes noted compared to yesterday , ostomy continues to be productive  Skin: Cool and moist.   Psychiatric: Normal affect, non-agitated, not confused       LABS:     Latest Ref Rng & Units 05/06/2022    4:53 AM 05/05/2022    4:59 AM 05/04/2022   12:17 PM  CMP  Glucose 70 - 99 mg/dL 82   118   168    BUN 8 - 23 mg/dL 27   34   32    Creatinine 0.61 - 1.24 mg/dL 1.45   1.66   1.71    Sodium 135 - 145 mmol/L 139   143   140    Potassium 3.5 - 5.1 mmol/L 3.8    4.4   4.0    Chloride 98 - 111 mmol/L 115   117   112    CO2 22 - 32 mmol/L 20   22   21     Calcium 8.9 - 10.3 mg/dL 7.7   7.8   8.3    Total Protein 6.5 - 8.1 g/dL   7.2    Total Bilirubin 0.3 - 1.2 mg/dL   1.7    Alkaline Phos 38 - 126 U/L   220    AST 15 - 41 U/L   56    ALT 0 - 44 U/L   33        Latest Ref Rng & Units 05/06/2022    4:53 AM 05/05/2022    4:59 AM 05/04/2022   12:17 PM  CBC  WBC 4.0 - 10.5 K/uL 8.1   12.3   25.5    Hemoglobin 13.0 - 17.0 g/dL 11.5   11.7   13.7    Hematocrit 39.0 - 52.0 % 33.5   35.0   40.8    Platelets 150 - 400 K/uL 220   249   323      RADS: N/a  Assessment:   SBO- resolving. Regular diet today.  Repeat CT in am to see if any changes to the fluid collection.  AKI- decreasing Cr Will continue with IVF, and monitor fluid overload.  Still hold lasix from home.   labs/images/medications/previous chart entries reviewed personally and relevant changes/updates noted above.

## 2022-05-07 ENCOUNTER — Inpatient Hospital Stay: Payer: Medicare Other

## 2022-05-07 ENCOUNTER — Encounter: Payer: Self-pay | Admitting: Surgery

## 2022-05-07 DIAGNOSIS — R001 Bradycardia, unspecified: Secondary | ICD-10-CM | POA: Diagnosis not present

## 2022-05-07 DIAGNOSIS — K56609 Unspecified intestinal obstruction, unspecified as to partial versus complete obstruction: Secondary | ICD-10-CM

## 2022-05-07 DIAGNOSIS — N183 Chronic kidney disease, stage 3 unspecified: Secondary | ICD-10-CM | POA: Diagnosis present

## 2022-05-07 LAB — BASIC METABOLIC PANEL
Anion gap: 3 — ABNORMAL LOW (ref 5–15)
BUN: 26 mg/dL — ABNORMAL HIGH (ref 8–23)
CO2: 19 mmol/L — ABNORMAL LOW (ref 22–32)
Calcium: 7.4 mg/dL — ABNORMAL LOW (ref 8.9–10.3)
Chloride: 118 mmol/L — ABNORMAL HIGH (ref 98–111)
Creatinine, Ser: 1.6 mg/dL — ABNORMAL HIGH (ref 0.61–1.24)
GFR, Estimated: 44 mL/min — ABNORMAL LOW (ref >60)
Glucose, Bld: 99 mg/dL (ref 70–99)
Potassium: 3.9 mmol/L (ref 3.5–5.1)
Sodium: 140 mmol/L (ref 135–145)

## 2022-05-07 LAB — GLUCOSE, CAPILLARY: Glucose-Capillary: 94 mg/dL (ref 70–99)

## 2022-05-07 LAB — CBC
HCT: 31.1 % — ABNORMAL LOW (ref 39.0–52.0)
Hemoglobin: 10.4 g/dL — ABNORMAL LOW (ref 13.0–17.0)
MCH: 33.1 pg (ref 26.0–34.0)
MCHC: 33.4 g/dL (ref 30.0–36.0)
MCV: 99 fL (ref 80.0–100.0)
Platelets: 182 10*3/uL (ref 150–400)
RBC: 3.14 MIL/uL — ABNORMAL LOW (ref 4.22–5.81)
RDW: 13.7 % (ref 11.5–15.5)
WBC: 8.4 10*3/uL (ref 4.0–10.5)
nRBC: 0 % (ref 0.0–0.2)

## 2022-05-07 LAB — TSH: TSH: 1.567 u[IU]/mL (ref 0.350–4.500)

## 2022-05-07 IMAGING — CT CT ABD-PELV W/ CM
2 of 5 series · 15 of 46 positions shown, 17 images · IV contrast (APPLIED)
Comparison: 05/04/2022

CLINICAL DATA: Follow-up small bowel obstruction and possible
fistula.

EXAM:
CT ABDOMEN AND PELVIS WITH CONTRAST
TECHNIQUE: Multidetector CT imaging of the abdomen and pelvis was performed
using the standard protocol following bolus administration of
intravenous contrast.

[Series 2: routine abd/pel with · axial · 0.84mm/px · z∈[-619,-179]mm · 12 of 106 slices shown, 14 images]
[im 9/106  soft-tissue]
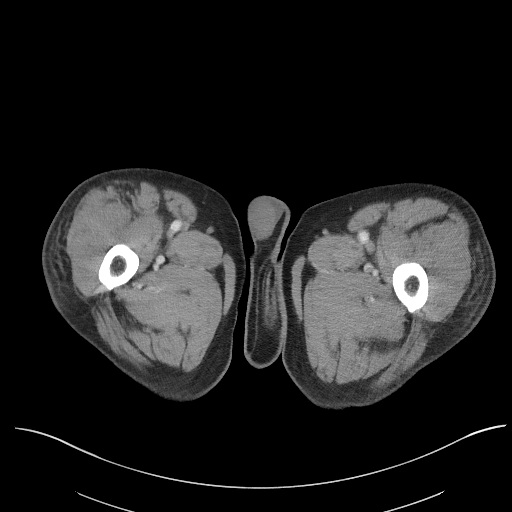
[im 9/106  bone]
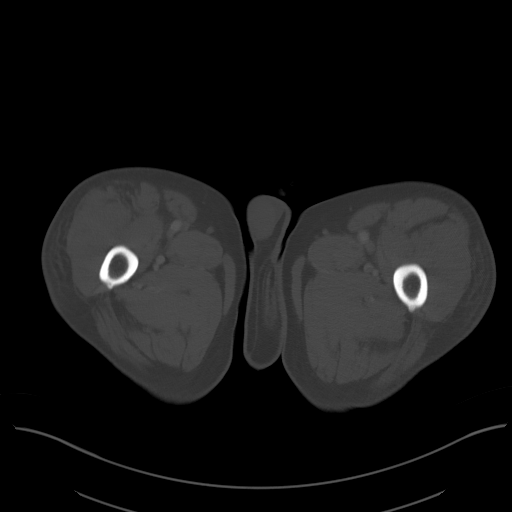
[im 17/106  soft-tissue]
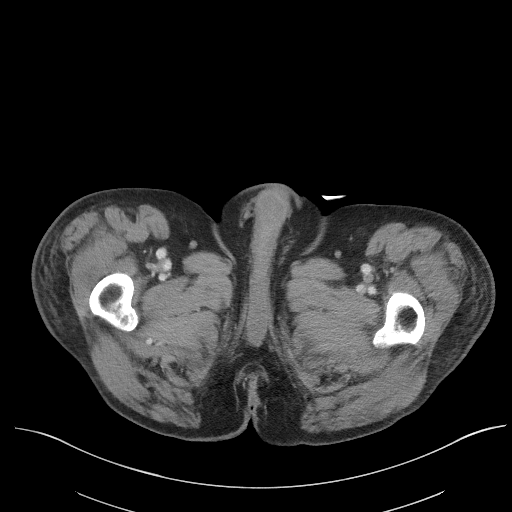
[im 25/106  soft-tissue]
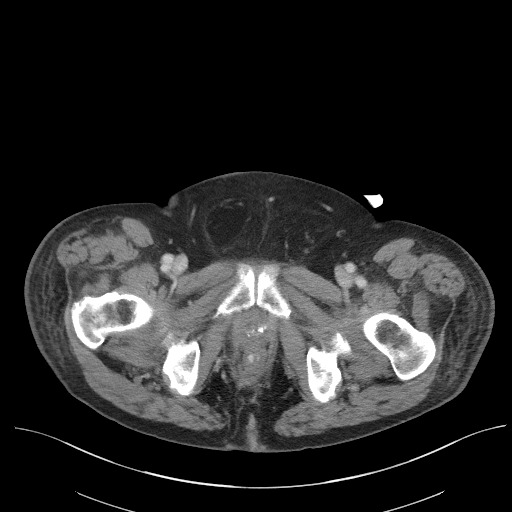
[im 33/106  soft-tissue]
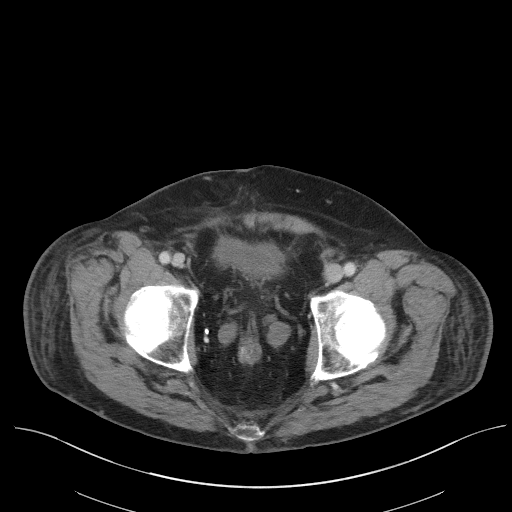
[im 41/106  soft-tissue]
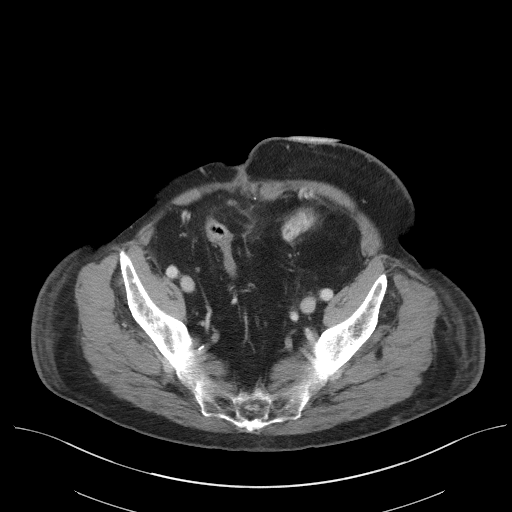
[im 49/106  soft-tissue]
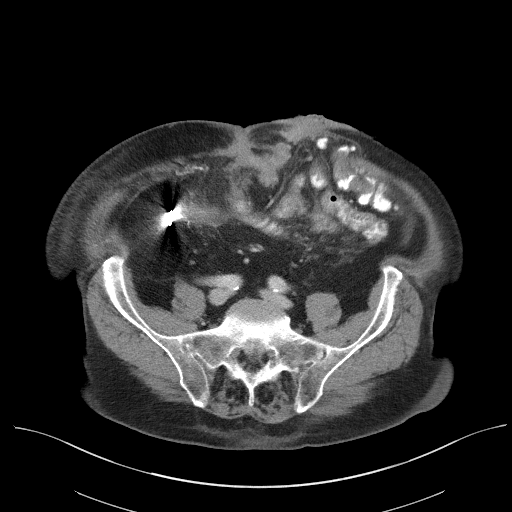
[im 57/106  soft-tissue]
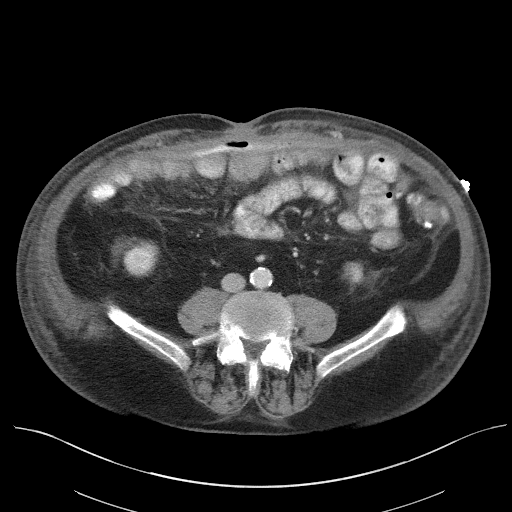
[im 65/106  soft-tissue]
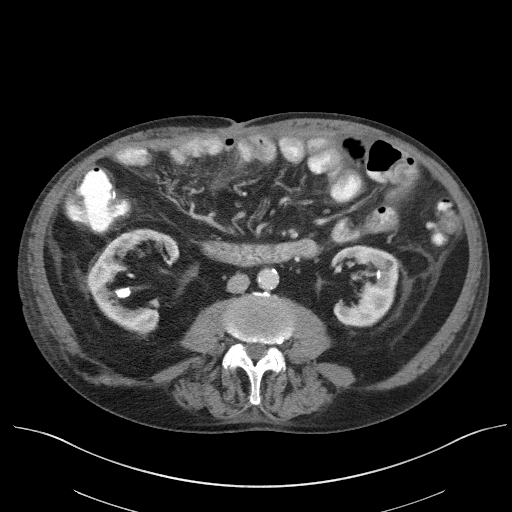
[im 73/106  soft-tissue]
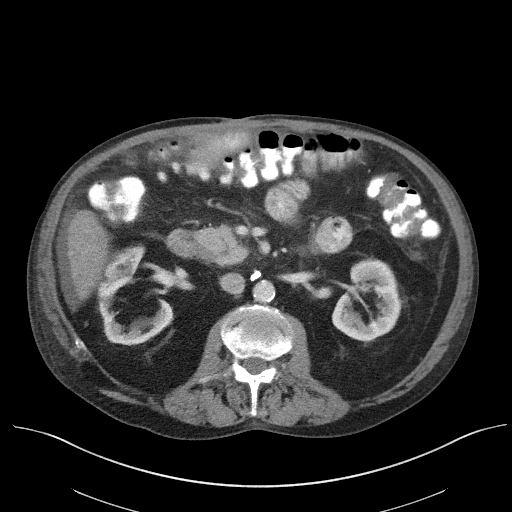
[im 73/106  bone]
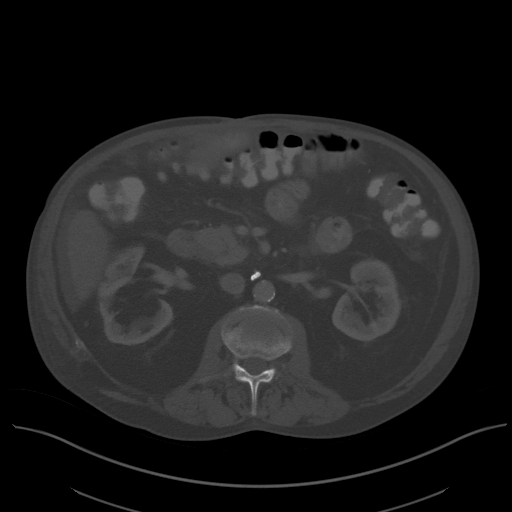
[im 81/106  soft-tissue]
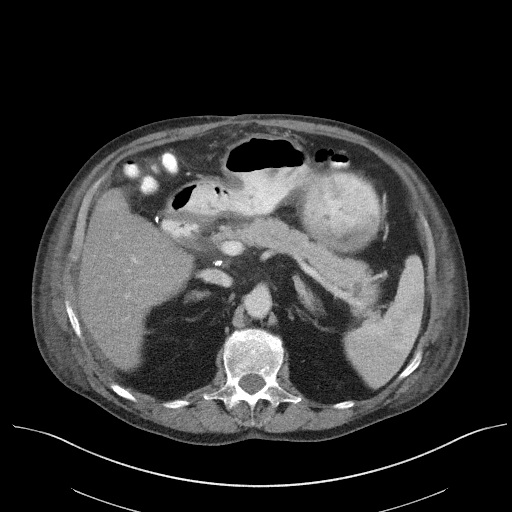
[im 89/106  soft-tissue]
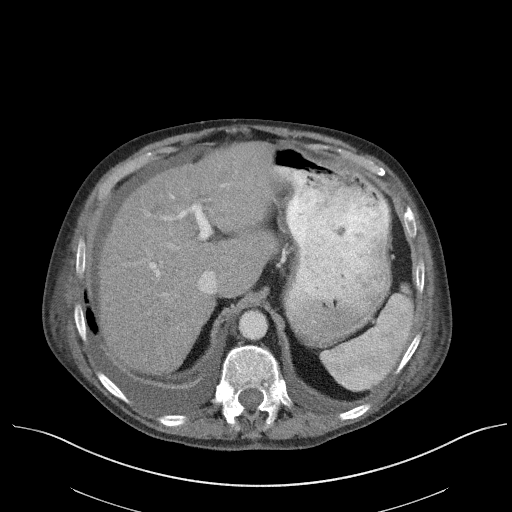
[im 97/106  soft-tissue]
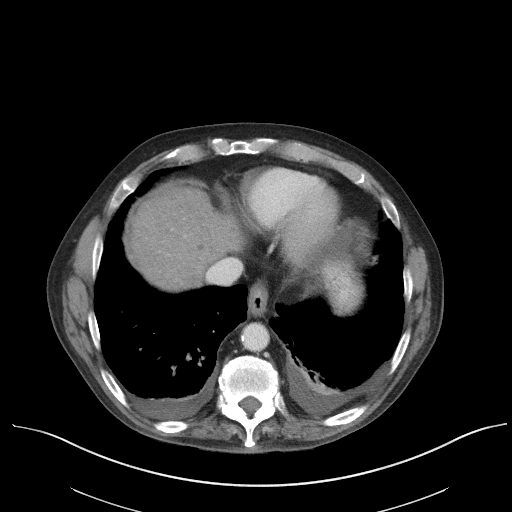

[Series 4: coronal st · coronal · 0.79mm/px · 3 of 98 slices shown]
[im 33/98  soft-tissue]
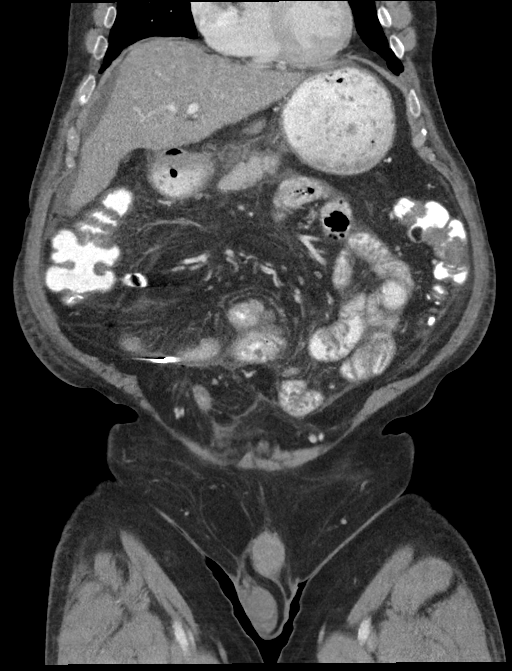
[im 44/98  soft-tissue]
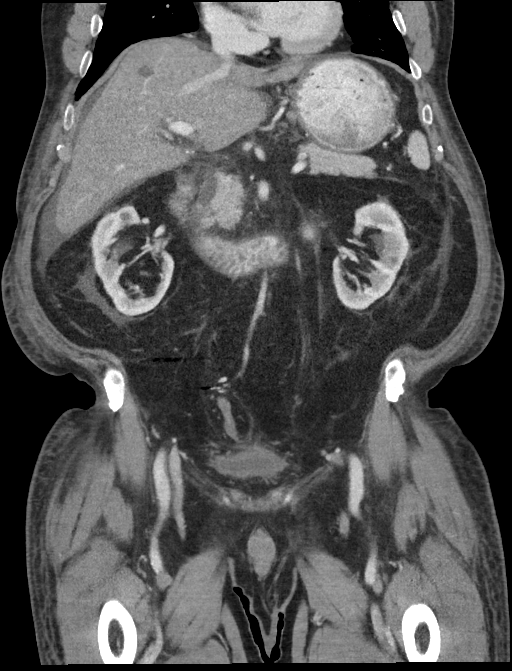
[im 54/98  soft-tissue]
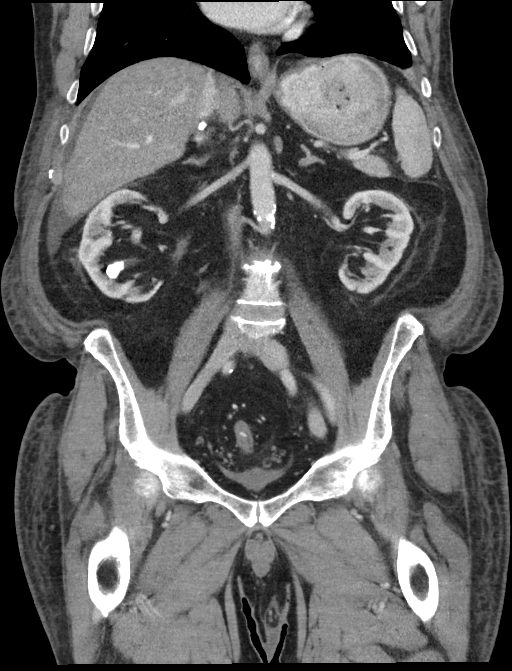

[15 of 46 positions shown; findings below may reference images not displayed]

RADIATION DOSE REDUCTION: This exam was performed according to the
departmental dose-optimization program which includes automated
exposure control, adjustment of the mA and/or kV according to
patient size and/or use of iterative reconstruction technique.

CONTRAST:  80mL OMNIPAQUE IOHEXOL 300 MG/ML  SOLN
FINDINGS: Lower Chest: New small bilateral pleural effusions.

Hepatobiliary: Enlargement of the caudate lobe and mild capsular
nodularity are highly suspicious for cirrhosis. Several small sub-cm
low-attenuation lesions are too small to characterize but remains
stable. Increased mild perihepatic ascites. Prior cholecystectomy.
No evidence of biliary obstruction.

Pancreas: 10 mm low-attenuation lesion is again seen in the
pancreatic tail, likely cystic in nature. No evidence of pancreatic
ductal dilatation or peripancreatic inflammatory changes.

Spleen: Within normal limits in size and appearance.

Adrenals/Urinary Tract: No masses identified. Stable small right
renal cyst (no followup imaging recommended). Right renal calculi
measuring up to 11 mm. No evidence of ureteral calculi or
hydronephrosis.

Stomach/Bowel: Descending colostomy is again seen in the left lower
quadrant. A small parastomal hernia is again seen containing a small
bowel loop. There has been resolution of small bowel obstruction
since prior study. Diverticulosis is again seen involving the distal
descending colon proximal to the colostomy and the sigmoid colon
distal to the colostomy, however there is no evidence of
diverticulitis. There is a persistent thin, fistula-like collection
containing oral contrast material and gas located just deep to the
anterior abdominal wall muscles, measuring approximately 14.6 x
cm on image 47/2. This likely arises from with small bowel or
descending colon in the region of the colostomy.

Vascular/Lymphatic: No pathologically enlarged lymph nodes. No acute
vascular findings. Aortic atherosclerotic calcification incidentally
noted.

Reproductive:  No mass or other significant abnormality.

Other:  None.

Musculoskeletal: No suspicious bone lesions identified. Stable old
L5 vertebral body compression fracture again noted.
IMPRESSION: Resolution of small bowel obstruction since prior study.

Persistent thin, fistula-like collection located just deep to the
anterior abdominal wall muscles, which now contains oral contrast
and likely arises from small bowel or descending colon in the region
of the left lower quadrant colostomy.

Probable hepatic cirrhosis. Increased mild perihepatic ascites, and
new small bilateral pleural effusions.

Stable small indeterminate liver lesions and probable tiny cystic
lesion in the pancreatic tail. Abdomen MRI without and with contrast
is recommended for further characterization.

## 2022-05-07 MED ORDER — IOHEXOL 9 MG/ML PO SOLN
500.0000 mL | ORAL | Status: AC
  Administered 2022-05-07 (×2): 500 mL via ORAL

## 2022-05-07 MED ORDER — IOHEXOL 300 MG/ML  SOLN
80.0000 mL | Freq: Once | INTRAMUSCULAR | Status: AC | PRN
  Administered 2022-05-07: 80 mL via INTRAVENOUS

## 2022-05-07 MED ORDER — SODIUM CHLORIDE 0.9 % IV SOLN: INTRAVENOUS | Status: DC

## 2022-05-07 NOTE — Consult Note (Addendum)
Initial Consultation Note   Patient: Dean Ryan IFO:[id number] DOB: 1941/03/15 PCP: Dean Macleod, PA-C DOA: 05/04/2022 DOS: the patient was seen and examined on 05/07/2022 Primary service: Benjamine Sprague, DO  Referring physician: Dr Lysle Pearl Reason for consult: Worsening renal function  Assessment/Plan: Assessment and Plan: CKD (chronic kidney disease) stage 3, GFR 30-59 ml/min ([place]) Patient at baseline has a serum creatinine of 1.26. On admission his serum creatinine was 1.71 and patient was started on IV fluid hydration.  His furosemide was placed on hold. Renal function did improve to 1.45 >> 1.60 Medical consult requested for evaluation of worsening stage III chronic kidney disease Patient is normotensive and imaging shows small bilateral pleural effusions. We will hold off on administering any diuretics for now Hold Cozaar Monitor renal function closely   Sinus bradycardia Patient is asymptomatic and most likely related to beta-blocker use Hold metoprolol Obtain TSH levels  Small bowel obstruction (HCC) Improved Patient able to tolerate oral diet and denies having any abdominal pain, no nausea or vomiting Further treatment plan per surgery  Physical deconditioning PT evaluation once vital signs improve       TR[facility] will continue to follow the patient.  HPI: Dean Ryan is a 81 y.o. 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 Abdominal pain was mostly in the periumbilical area and described as a sharp pain rated 7 x 10 in intensity at its worst and associated with nausea and vomiting.  He was admitted to the surgical service and symptoms have improved since admission and patient is able to tolerate a regular diet. Medical consult was requested for evaluation of worsening renal  function. He has bilateral lower extremity swelling and complains of shortness of breath which is unchanged from his baseline.  He denies having any orthopnea. Patient denies having any nausea, no vomiting, no frequency of urination, no dizziness, no lightheadedness, no chest pain, no palpitations or diaphoresis. He was on a diuretic which was placed on hold on admission. Baseline serum creatinine is 1.46 from 5 months ago and today during my evaluation it is 1.60.  Review of Systems: As mentioned in the history of present illness. All other systems reviewed and are negative. 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 His smoking use included cigarettes. He has never used smokeless tobacco. He reports current alcohol use. He reports that he does not currently use drugs.  No Known Allergies  History reviewed. No pertinent family history.  Prior to Admission medications   Medication Sig Start Date End Date Taking? Authorizing Provider  Cholecalciferol 25 MCG (1000 UT) capsule Take 1,000 Units by mouth daily.    Yes [provider]  furosemide (LASIX) 40 MG tablet Take 40 mg by mouth daily.    Yes [provider]  loratadine (CLARITIN) 10 MG tablet Take 10 mg by mouth daily.    Yes [provider]  losartan (COZAAR) 100 MG tablet Take 100 mg by mouth daily.  11/25/17  Yes [provider]  Magnesium Oxide (MAG-200 PO) Take 1 tablet by mouth daily. 12/27/21  Yes [provider]  metoprolol succinate (TOPROL-XL) 50 MG 24 hr tablet Take 1 tablet by mouth daily. 10/23/17  Yes [provider]  Multiple Vitamins-Minerals  (OCUVITE-LUTEIN PO) Take 1 tablet by mouth daily.   Yes [provider]  Multiple Vitamins-Minerals (PRESERVISION AREDS 2 PO) Take 1 tablet by mouth daily.   Yes [provider]  pantoprazole (PROTONIX) 40 MG tablet Take 1 tablet by mouth daily. 04/08/18  Yes [provider]  triamcinolone (KENALOG) 0.025 % ointment Apply to face twice daily for 3 days. Avoid ears and eyelids. 12/20/21  Yes Moye, [place], MD  Ustekinumab Delsa Grana) 45 MG/0.5ML SOLN 47m subcutaneous every 8weeks 12/21/20  Yes [provider]  mesalamine (APRISO) 0.375 g 24 hr capsule Take 4 capsules by mouth daily.    [provider]  spironolactone (ALDACTONE) 25 MG tablet Take 1 tablet by mouth daily.    [provider]    Physical Exam: Vitals:   05/06/22 1544 05/06/22 1957 05/07/22 0422 05/07/22 0800  BP: (!) 107/54 (!) 95/52 (!) 101/58 (!) 108/52  Pulse: (!) 58 (!) 57 (!) 53 (!) 44  Resp: 18 18 18 16   Temp: 98.1 F (36.7 C) 97.6 F (36.4 C) 98.2 F (36.8 C) 98 F (36.7 C)  TempSrc: Oral Oral Oral Oral  SpO2: 98% 98% 99% 98%  Weight:      Height:       Physical Exam Vitals and nursing note reviewed.  Constitutional:      Appearance: He is well-developed.  HENT:     Head: Normocephalic and atraumatic.     Mouth/Throat:     Mouth: Mucous membranes are moist.  Cardiovascular:     Rate and Rhythm: Bradycardia present.     Heart sounds: Normal heart sounds.  Abdominal:     General: Abdomen is flat. A surgical scar is present. Bowel sounds are normal.     Comments: Colostomy bag in place  Skin:    General: Skin is warm and dry.  Neurological:     General: No focal deficit present.     Mental Status: He is alert.     Motor: Weakness present.  Psychiatric:        Mood and Affect: Mood normal.        Behavior: Behavior normal.    Data Reviewed:  Relevant notes from primary care and specialist visits, past discharge summaries as available in EHR, including  Care Everywhere. Prior diagnostic testing as pertinent to current admission diagnoses Updated medications and problem lists for reconciliation ED course, including vitals, labs, imaging, treatment and response to treatment Triage notes, nursing and pharmacy notes and ED provider's notes Notable results as noted in HPI Labs reviewed.  Sodium 140, potassium 3.9, chloride 118, bicarb 19, glucose 99, BUN 26, creatinine 1.60 above a baseline of 1.26, calcium 7.4, white count 8.4, hemoglobin 10.4, hematocrit 31.1, platelet count 182 CT scan of abdomen and pelvis shows resolution of small bowel obstruction since prior study. Persistent thin, fistula-like collection located just deep to the anterior abdominal wall muscles, which now contains oral contrast and likely arises from small bowel or descending colon in the region of the left lower quadrant colostomy. Probable hepatic cirrhosis. Increased mild perihepatic ascites, and new small bilateral pleural effusions. Stable small indeterminate liver lesions and probable tiny cystic lesion in the pancreatic tail. Abdomen MRI without and with contrast is recommended for further characterization. Twelve-lead  EKG reviewed by me shows sinus rhythm, PVCs and  left bundle branch block There are no new results to review at this time.    Family Communication: Greater than 50% of time was spent discussing patient's condition and plan of care with him at the bedside.  All questions and concerns have been addressed.  He verbalizes understanding and agrees with the plan. Primary team communication:  Thank you very much for involving [place] in the care of your patient.  Author: Collier Bullock, MD 05/07/2022 2:28 PM  For on call review [email or web address].

## 2022-05-07 NOTE — Care Management Important Message (Signed)
Important Message  Patient Details  Name: Dean Ryan MRN: [id number] Date of Birth: 07-30-1941   Medicare Important Message Given:  Yes     Dannette Barbara 05/07/2022, 11:04 AM

## 2022-05-07 NOTE — Assessment & Plan Note (Addendum)
Patient at baseline has a serum creatinine of 1.26. On admission his serum creatinine was 1.71 and patient was started on IV fluid hydration.  His furosemide was placed on hold. Renal function did improve to 1.45 >> 1.60 Medical consult requested for evaluation of worsening stage III chronic kidney disease Patient is normotensive and imaging shows small bilateral pleural effusions. We will hold off on administering any diuretics for now Hold Cozaar Monitor renal function closely

## 2022-05-07 NOTE — Assessment & Plan Note (Signed)
PT evaluation once vital signs improve

## 2022-05-07 NOTE — Assessment & Plan Note (Signed)
Improved Patient able to tolerate oral diet and denies having any abdominal pain, no nausea or vomiting Further treatment plan per surgery

## 2022-05-07 NOTE — Assessment & Plan Note (Signed)
Patient is asymptomatic and most likely related to beta-blocker use Hold metoprolol Obtain TSH levels

## 2022-05-07 NOTE — Progress Notes (Signed)
Subjective:  CC: Dean Ryan is a 81 y.o. male  Hospital stay day 3,   SBO, ?bowel perforation?  HPI: No issues overnight.  Tolerating regular diet no more pain  ROS:  General: Denies weight loss, weight gain, fatigue, fevers, chills, and night sweats. Heart: Denies chest pain, palpitations, racing heart, irregular heartbeat, leg pain or swelling, and decreased activity tolerance. Respiratory: Denies breathing difficulty, shortness of breath, wheezing, cough, and sputum. GI: Denies change in appetite, heartburn, nausea, vomiting, constipation, diarrhea, and blood in stool. GU: Denies difficulty urinating, pain with urinating, urgency, frequency, blood in urine.   Objective:   Temp:  [97.6 F (36.4 C)-98.2 F (36.8 C)] 98 F (36.7 C) (05/30 0800) Pulse Rate:  [44-58] 44 (05/30 0800) Resp:  [16-18] 16 (05/30 0800) BP: (95-108)/(52-58) 108/52 (05/30 0800) SpO2:  [98 %-99 %] 98 % (05/30 0800)     Height: 5' 5.98" (167.6 cm) (reported 04/30/22) Weight: 72.6 kg     Intake/Output this shift:   Intake/Output Summary (Last 24 hours) at 05/07/2022 1312 Last data filed at 05/07/2022 1028 Gross per 24 hour  Intake 480 ml  Output 1100 ml  Net -620 ml    Constitutional :  alert, cooperative, appears stated age, and no distress  Respiratory:  clear to auscultation bilaterally  Cardiovascular:  regular rate and rhythm  Gastrointestinal: Soft, no guarding, no TTP midline incision, no overlying skin changes noted compared to yesterday , ostomy continues to be productive  Skin: Cool and moist.   Psychiatric: Normal affect, non-agitated, not confused       LABS:     Latest Ref Rng & Units 05/07/2022    4:02 AM 05/06/2022    4:53 AM 05/05/2022    4:59 AM  CMP  Glucose 70 - 99 mg/dL 99   82   118    BUN 8 - 23 mg/dL 26   27   34    Creatinine 0.61 - 1.24 mg/dL 1.60   1.45   1.66    Sodium 135 - 145 mmol/L 140   139   143    Potassium 3.5 - 5.1 mmol/L 3.9   3.8   4.4    Chloride 98 -  111 mmol/L 118   115   117    CO2 22 - 32 mmol/L 19   20   22     Calcium 8.9 - 10.3 mg/dL 7.4   7.7   7.8        Latest Ref Rng & Units 05/07/2022    4:02 AM 05/06/2022    4:53 AM 05/05/2022    4:59 AM  CBC  WBC 4.0 - 10.5 K/uL 8.4   8.1   12.3    Hemoglobin 13.0 - 17.0 g/dL 10.4   11.5   11.7    Hematocrit 39.0 - 52.0 % 31.1   33.5   35.0    Platelets 150 - 400 K/uL 182   220   249      RADS: CLINICAL DATA:  Follow-up small bowel obstruction and possible fistula.   EXAM: CT ABDOMEN AND PELVIS WITH CONTRAST   TECHNIQUE: Multidetector CT imaging of the abdomen and pelvis was performed using the standard protocol following bolus administration of intravenous contrast.   RADIATION DOSE REDUCTION: This exam was performed according to the departmental dose-optimization program which includes automated exposure control, adjustment of the mA and/or kV according to patient size and/or use of iterative reconstruction technique.   CONTRAST:  59m  OMNIPAQUE IOHEXOL 300 MG/ML  SOLN   COMPARISON:  05/04/2022   FINDINGS: Lower Chest: New small bilateral pleural effusions.   Hepatobiliary: Enlargement of the caudate lobe and mild capsular nodularity are highly suspicious for cirrhosis. Several small sub-cm low-attenuation lesions are too small to characterize but remains stable. Increased mild perihepatic ascites. Prior cholecystectomy. No evidence of biliary obstruction.   Pancreas: 10 mm low-attenuation lesion is again seen in the pancreatic tail, likely cystic in nature. No evidence of pancreatic ductal dilatation or peripancreatic inflammatory changes.   Spleen: Within normal limits in size and appearance.   Adrenals/Urinary Tract: No masses identified. Stable small right renal cyst (no followup imaging recommended). Right renal calculi measuring up to 11 mm. No evidence of ureteral calculi or hydronephrosis.   Stomach/Bowel: Descending colostomy is again seen in the left  lower quadrant. 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 There has been resolution of small bowel obstruction since prior study. Diverticulosis is again seen involving the distal descending colon proximal to the colostomy and the sigmoid colon distal to the colostomy, however there is no evidence of diverticulitis. There is a persistent thin, fistula-like collection containing oral contrast material and gas located just deep to the anterior abdominal wall muscles, measuring approximately 14.6 x 0.7 cm on image 47/2. This likely arises from with small bowel or descending colon in the region of the colostomy.   Vascular/Lymphatic: No pathologically enlarged lymph nodes. No acute vascular findings. Aortic atherosclerotic calcification incidentally noted.   Reproductive:  No mass or other significant abnormality.   Other:  None.   Musculoskeletal: No suspicious bone lesions identified. Stable old L5 vertebral body compression fracture again noted.   IMPRESSION: Resolution of small bowel obstruction since prior study.   Persistent thin, fistula-like collection located just deep to the anterior abdominal wall muscles, which now contains oral contrast and likely arises from small bowel or descending colon in the region of the left lower quadrant colostomy.   Probable hepatic cirrhosis. Increased mild perihepatic ascites, and new small bilateral pleural effusions.   Stable small indeterminate liver lesions and probable tiny cystic lesion in the pancreatic tail. Abdomen MRI without and with contrast is recommended for further characterization.     Electronically Signed   By: Marlaine Hind M.D.   On: 05/07/2022 13:18    Assessment:   SBO- resolved. Tolerating Regular diet today.  CT as above.  Will continue to monitor since asymptomatic now.  Any additional intervention at this point will cause more harm than benefit giving complicated abdominal  surgical history  AKI- hospitalist consult for persistently elevated Cr.    labs/images/medications/previous chart entries reviewed personally and relevant changes/updates noted above.

## 2022-05-08 DIAGNOSIS — K56609 Unspecified intestinal obstruction, unspecified as to partial versus complete obstruction: Secondary | ICD-10-CM | POA: Diagnosis not present

## 2022-05-08 DIAGNOSIS — R5381 Other malaise: Secondary | ICD-10-CM | POA: Diagnosis not present

## 2022-05-08 DIAGNOSIS — R001 Bradycardia, unspecified: Secondary | ICD-10-CM | POA: Diagnosis not present

## 2022-05-08 DIAGNOSIS — N183 Chronic kidney disease, stage 3 unspecified: Secondary | ICD-10-CM | POA: Diagnosis not present

## 2022-05-08 LAB — CBC
HCT: 30.6 % — ABNORMAL LOW (ref 39.0–52.0)
Hemoglobin: 10.6 g/dL — ABNORMAL LOW (ref 13.0–17.0)
MCH: 33.9 pg (ref 26.0–34.0)
MCHC: 34.6 g/dL (ref 30.0–36.0)
MCV: 97.8 fL (ref 80.0–100.0)
Platelets: 168 10*3/uL (ref 150–400)
RBC: 3.13 MIL/uL — ABNORMAL LOW (ref 4.22–5.81)
RDW: 13.8 % (ref 11.5–15.5)
WBC: 9 10*3/uL (ref 4.0–10.5)
nRBC: 0 % (ref 0.0–0.2)

## 2022-05-08 LAB — BASIC METABOLIC PANEL
Anion gap: 4 — ABNORMAL LOW (ref 5–15)
BUN: 23 mg/dL (ref 8–23)
CO2: 17 mmol/L — ABNORMAL LOW (ref 22–32)
Calcium: 7.5 mg/dL — ABNORMAL LOW (ref 8.9–10.3)
Chloride: 117 mmol/L — ABNORMAL HIGH (ref 98–111)
Creatinine, Ser: 1.47 mg/dL — ABNORMAL HIGH (ref 0.61–1.24)
GFR, Estimated: 48 mL/min — ABNORMAL LOW (ref >60)
Glucose, Bld: 100 mg/dL — ABNORMAL HIGH (ref 70–99)
Potassium: 3.8 mmol/L (ref 3.5–5.1)
Sodium: 138 mmol/L (ref 135–145)

## 2022-05-08 MED ORDER — SPIRONOLACTONE 25 MG PO TABS
25.0000 mg | ORAL_TABLET | Freq: Every day | ORAL | Status: DC
  Filled 2022-05-08: qty 1

## 2022-05-08 MED ORDER — SODIUM BICARBONATE 8.4 % IV SOLN
Freq: Once | INTRAVENOUS | Status: AC
  Filled 2022-05-08: qty 1000

## 2022-05-08 MED ORDER — VITAMIN D 25 MCG (1000 UNIT) PO TABS
1000.0000 [IU] | ORAL_TABLET | Freq: Every day | ORAL | Status: DC
  Administered 2022-05-08 – 2022-05-09 (×2): 1000 [IU] via ORAL
  Filled 2022-05-08 (×2): qty 1

## 2022-05-08 MED ORDER — OCUVITE-LUTEIN PO CAPS
1.0000 | ORAL_CAPSULE | Freq: Every day | ORAL | Status: DC
  Administered 2022-05-08 – 2022-05-09 (×2): 1 via ORAL
  Filled 2022-05-08 (×2): qty 1

## 2022-05-08 MED ORDER — METOPROLOL SUCCINATE ER 25 MG PO TB24
25.0000 mg | ORAL_TABLET | Freq: Every day | ORAL | Status: DC
  Administered 2022-05-09: 25 mg via ORAL
  Filled 2022-05-08 (×2): qty 1

## 2022-05-08 MED ORDER — SODIUM BICARBONATE 650 MG PO TABS
650.0000 mg | ORAL_TABLET | Freq: Three times a day (TID) | ORAL | Status: DC
  Administered 2022-05-08 (×2): 650 mg via ORAL
  Filled 2022-05-08 (×2): qty 1

## 2022-05-08 NOTE — Progress Notes (Signed)
Patient is concerned about taking the spironolactone and the metoprolol. His BP is 117/52 HR 66, MAP 72. He said his BP is usually the 130's over 60 when taking the med at home. He is worried his BP will drop too much. Dr Posey Pronto notified and said to hold afternoon dose of metoprolol and spironolactone

## 2022-05-08 NOTE — TOC Initial Note (Signed)
Transition of Care [facility]) - Initial/Assessment Note    Patient Details  Name: Dean Ryan MRN: [id number] Date of Birth: 01-11-1941  Transition of [facility]) CM/SW Contact:    Beverly Sessions, RN Phone Number: 05/08/2022, 3:04 PM  Clinical Narrative:                  Transition of Care [facility]) Screening Note   Patient Details  Name: Dean Ryan Date of Birth: [birth year]   Transition of Care [facility]) CM/SW Contact:    Beverly Sessions, RN Phone Number: 05/08/2022, 3:04 PM    Transition of Care Department [facility]) has reviewed patient and no TOC needs have been identified at this time. We will continue to monitor patient advancement through interdisciplinary progression rounds. If new patient transition needs arise, please place a TOC consult.          Patient Goals and CMS Choice        Expected Discharge Plan and Services                                                Prior Living Arrangements/Services                       Activities of Daily Living Home Assistive Devices/Equipment: Cane (specify quad or straight), Eyeglasses, Grab bars around toilet ADL Screening (condition at time of admission) Patient's cognitive ability adequate to safely complete daily activities?: Yes Is the patient deaf or have difficulty hearing?: Yes Does the patient have difficulty seeing, even when wearing glasses/contacts?: Yes Does the patient have difficulty concentrating, remembering, or making decisions?: No Patient able to express need for assistance with ADLs?: Yes Does the patient have difficulty dressing or bathing?: No Independently performs ADLs?: Yes (appropriate for developmental age) Does the patient have difficulty walking or climbing stairs?: Yes Weakness of Legs: None Weakness of Arms/Hands: None  Permission Sought/Granted                  Emotional Assessment              Admission diagnosis:  Small bowel obstruction ([place])  [K56.609] SBO (small bowel obstruction) ([place]) [K56.609] Perforated bowel ([place]) [K63.1] Patient Active Problem List   Diagnosis Date Noted   CKD (chronic kidney disease) stage 3, GFR 30-59 ml/min ([place]) 05/07/2022   Sinus bradycardia 05/07/2022   Perforated bowel ([place]) 05/04/2022   Small bowel obstruction ([place]) 11/2021   Physical deconditioning 11/2021   PCP:  Derald Macleod, PA-C Pharmacy:   [id number] [place]Phone: [phone]: [phone] [id number] [place]Phone: [phone]: [phone]    Social Determinants of Health (SDOH) Interventions    Readmission Risk Interventions     View : No data to display.

## 2022-05-08 NOTE — Progress Notes (Signed)
  Progress Note   Patient: Dean Ryan DOB: 07-19-1941 DOA: 05/04/2022     4 DOS: the patient was seen and examined on 05/08/2022   Brief hospital course:  Dean Ryan is a 81 y.o. 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 Abdominal pain was mostly in the periumbilical area and described as a sharp pain rated 7 x 10 in intensity at its worst and associated with nausea and vomiting.  He was admitted to the surgical service and symptoms have improved since admission and patient is able to tolerate a regular diet. Medical consult was requested for evaluation of worsening renal function.  Assessment and Plan: CKD (chronic kidney disease) stage 3, GFR 30-59 ml/min (HCC) --Patient at baseline has a serum creatinine of 1.26. --On admission his serum creatinine was 1.71 and patient was started on IV fluid hydration. --  His furosemide was placed on hold. --Renal function did improve to 1.43>> 1.45 >> 1.60 --Patient is normotensive and imaging shows small bilateral pleural effusions. --give IV bicarb gtt and start po bicarb --Hold Cozaar --Monitor renal function closely   Sinus bradycardia --Patient is asymptomatic and most likely related to beta-blocker use   Small bowel obstruction ([place]) --Improved --Patient able to tolerate oral diet and denies having any abdominal pain, no nausea or vomiting --Further treatment plan per surgery  Physical deconditioning --PT evaluation once vital signs improve   overall patient improving. Will check labs tomorrow to ensure bicarb improved.      Subjective: no new complaints. Tolerating PO diet.  Physical Exam: Vitals:   05/07/22 1444 05/07/22 1525 05/07/22 2142 05/08/22 0757  BP: (!) 110/51 (!) 111/51 (!) 112/46 (!) 108/58  Pulse: 63 (!) 46 69  (!) 59  Resp:  16 20 16   Temp:  97.9 F (36.6 C) 98.6 F (37 C) 97.6 F (36.4 C)  TempSrc:  Oral  Oral  SpO2: 98% 98%  98%  Weight:      Height:       Constitutional :  alert, cooperative, appears stated age, and no distress  Respiratory:  clear to auscultation bilaterally  Cardiovascular:  regular rate and rhythm  Gastrointestinal: Soft, no guarding, no TTP midline incision, no overlying skin changes noted, ostomy continues to be productive  Skin: Cool and moist.   Psychiatric: Normal affec, alert and oriented x3        Family Communication: none  Disposition: Status is: Inpatient Remains inpatient appropriate because: replacing bicarb  Planned Discharge Destination: Home    Time spent: 30 minutes  Author: Fritzi Mandes, MD 05/08/2022 3:14 PM  For on call review [email or web address].

## 2022-05-09 DIAGNOSIS — R001 Bradycardia, unspecified: Secondary | ICD-10-CM | POA: Diagnosis not present

## 2022-05-09 DIAGNOSIS — N183 Chronic kidney disease, stage 3 unspecified: Secondary | ICD-10-CM | POA: Diagnosis not present

## 2022-05-09 DIAGNOSIS — K56609 Unspecified intestinal obstruction, unspecified as to partial versus complete obstruction: Secondary | ICD-10-CM | POA: Diagnosis not present

## 2022-05-09 DIAGNOSIS — R5381 Other malaise: Secondary | ICD-10-CM | POA: Diagnosis not present

## 2022-05-09 LAB — BASIC METABOLIC PANEL
Anion gap: 0 — ABNORMAL LOW (ref 5–15)
BUN: 16 mg/dL (ref 8–23)
CO2: 21 mmol/L — ABNORMAL LOW (ref 22–32)
Calcium: 7.5 mg/dL — ABNORMAL LOW (ref 8.9–10.3)
Chloride: 119 mmol/L — ABNORMAL HIGH (ref 98–111)
Creatinine, Ser: 1.21 mg/dL (ref 0.61–1.24)
GFR, Estimated: >60 mL/min (ref >60)
Glucose, Bld: 86 mg/dL (ref 70–99)
Potassium: 3.4 mmol/L — ABNORMAL LOW (ref 3.5–5.1)
Sodium: 140 mmol/L (ref 135–145)

## 2022-05-09 LAB — CULTURE, BLOOD (ROUTINE X 2)
Culture: NO GROWTH
Culture: NO GROWTH
Special Requests: ADEQUATE

## 2022-05-09 MED ORDER — FUROSEMIDE 20 MG PO TABS: 20.0000 mg | ORAL_TABLET | Freq: Every day | ORAL | 0 refills | Status: DC | PRN

## 2022-05-09 NOTE — Discharge Instructions (Signed)
F/u with primary care regarding kidney issues.  Call surgery office for any new, recurrent concerns regarding abdominal symptoms

## 2022-05-09 NOTE — Discharge Summary (Signed)
Physician Discharge Summary  Patient ID: AMIRE LEAZER MRN: [id number] DOB/AGE: 17-May-1941 81 y.o.  Admit date: 05/04/2022 Discharge date: 05/09/22  Admission Diagnoses: Small bowel obstruction, bowel perforation, acute kidney injury  Discharge Diagnoses:  Small bowel obstruction, small bowel fistula, CKD stage III  Discharged Condition: good  Hospital Course: admitted for above.  Small bowel follow-through performed showing contrast within the colon and patient had return of bowel function without any additional intervention during no need for NG tube placement.  GI consulted in the meantime to make sure the fluid collection noted on initial CT scan was not due to a bowel perforation from a Crohn's flare.  GI ordered inflammatory markers which were all negative and signed off.  Repeat CT scan showed contrast extravasation into the area where the initial fluid collection was noted but no interval increase in size.  Based on his previous complicated surgical history as well as this new CT finding, it was assumed this fluid collection was a contained small bowel fistula that currently is asymptomatic.  Decision was made to forego any surgical intervention since any intervention at this point likely will cause more harm than benefit.  Initial increasing creatinine levels thought to be noted from dehydration.  This persisted despite IV fluid resuscitation so hospitalist was consulted for further management.  They recommended close outpatient follow-up since creatinine remained stable towards the latter half of the stay.  At time of discharge, patient had no pain, productive output from ostomy, and was tolerating regular diet.  Recommended patient follow-up closely with his primary care doctor regarding his kidney disease, follow-up with surgery office as needed.  Consults: GI and hospitalist  Discharge Exam: Blood pressure (!) 121/54, pulse (!) 108, temperature 98.3 F (36.8 C), temperature source  Oral, resp. rate 18, height 5' 5.98" (1.676 m), weight 72.6 kg, SpO2 98 %. General appearance: alert, cooperative, and no distress GI: soft, non-tender; bowel sounds normal; no masses,  no organomegaly, midline abdominal incision with no increasing erythema or other acute skin changes  Disposition:  Discharge disposition: 01-Home or Self Care       Discharge Instructions     Discharge patient   Complete by: As directed    Discharge disposition: 01-Home or Self Care   Discharge patient date: 05/09/2022      Allergies as of 05/09/2022   No Known Allergies      Medication List     STOP taking these medications    mesalamine 0.375 g 24 hr capsule Commonly known as: APRISO   spironolactone 25 MG tablet Commonly known as: ALDACTONE       TAKE these medications    Cholecalciferol 25 MCG (1000 UT) capsule Take 1,000 Units by mouth daily.   furosemide 20 MG tablet Commonly known as: LASIX Take 1 tablet (20 mg total) by mouth daily as needed. What changed:  medication strength how much to take when to take this reasons to take this   loratadine 10 MG tablet Commonly known as: CLARITIN Take 10 mg by mouth daily.   losartan 100 MG tablet Commonly known as: COZAAR Take 100 mg by mouth daily.   MAG-200 PO Take 1 tablet by mouth daily.   metoprolol succinate 50 MG 24 hr tablet Commonly known as: TOPROL-XL Take 1 tablet by mouth daily.   pantoprazole 40 MG tablet Commonly known as: PROTONIX Take 1 tablet by mouth daily.   PRESERVISION AREDS 2 PO Take 1 tablet by mouth daily. What changed: Another medication with  the same name was removed. Continue taking this medication, and follow the directions you see here.   Stelara 45 MG/0.5ML Soln Generic drug: Ustekinumab 44m subcutaneous every 8weeks   triamcinolone 0.025 % ointment Commonly known as: KENALOG Apply to face twice daily for 3 days. Avoid ears and eyelids.        Follow-up Information      VDerald Macleod PA-C. Go on 05/15/2022.   Specialty: Internal Medicine Why: hospital f/u appointment time 1pm arrive at 12:45 Contact information: [phone]         Ilana Prezioso, DO Follow up.   Specialty: Surgery Why: As needed Contact information: [address] [id number] [phone]                 Total time spent arranging discharge was >347m. Signed: IsBenjamine Sprague/12/2021, 1:39 PM

## 2022-05-09 NOTE — Plan of Care (Addendum)
Nutrition Education Note  RD consulted for nutrition education regarding high output colostomy.  Case discussed with MD; requesting RD evaluate pt prior to discharge secondary to dehydration and high output ileostomy.   Spoke with pt at bedside, who is very knowledgeable about colostomy, stating he has had one for over 40 years. His stool output varies from a paste like consistency to "watery liquid" and pt acknowledges this depends on what he eats. Pt knowledgeable about diet, but does admit that he does not always do as he should. Pt consumes 2 meals per day, which are often eaten out (Breakfast: eggs, toast, sausage, and water, Dinner: [place] food, [employer], or soup and sandwich). Pt has a 20 ounce yeti cup filled with water that he keeps with him at all times. He avoids broccoli and fried foods as those tend to exacerbate his ostomy output the worst. RD focused education on foods that help bulk stools and adequate hydration.   RD provided "Colostomy Nutrition Therapy" handout from the Academy of Nutrition and Dietetics. Reviewed patient's dietary recall. Reviewed foods to avoid with colostomy as well as foods that help bulk up stool. Discussed fluid goals and importance of hydration.   RD discussed why it is important for patient to adhere to diet recommendations, and emphasized the role of fluids and foods to avoid. Teach back method used.  Expect fair to good compliance.  Current diet order is regular, patient is consuming approximately 100% of meals at this time. Labs and medications reviewed. No further nutrition interventions warranted at this time. RD contact information provided. If additional nutrition issues arise, please re-consult RD.   Loistine Chance, RD, LDN, [place] Registered Dietitian II Certified Diabetes Care and Education Specialist Please refer to [facility] for RD and/or RD on-call/weekend/after hours pager

## 2022-05-09 NOTE — Progress Notes (Addendum)
  Progress Note   Patient: Dean Ryan YOM:[id number] DOB: 04-10-1941 DOA: 05/04/2022     5 DOS: the patient was seen and examined on 05/09/2022   Brief hospital course:  Dean Ryan is a 81 y.o. 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 Abdominal pain was mostly in the periumbilical area and described as a sharp pain rated 7 x 10 in intensity at its worst and associated with nausea and vomiting.  He was admitted to the surgical service and symptoms have improved since admission and patient is able to tolerate a regular diet. Medical consult was requested for evaluation of worsening renal function.  Assessment and Plan: CKD (chronic kidney disease) stage 3, GFR 30-59 ml/min (HCC) --Patient at baseline has a serum creatinine of 1.26. --On admission his serum creatinine was 1.71 and patient was started on IV fluid hydration. -- His furosemide was placed on hold. 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 Labs show improvement. Pt is feeling at baseline. Ok to go home from medical standpoint.     Subjective: no new complaints. Tolerating PO diet.  Physical Exam: Vitals:   05/08/22 1606 05/08/22 1958 05/09/22 0605 05/09/22 0823  BP: (!) 117/52 (!) 114/58  (!) 113/56 (!) 121/54  Pulse: 65 66 62 (!) 108  Resp: 14 17 20 18   Temp: 98.8 F (37.1 C) 98.6 F (37 C) 97.9 F (36.6 C) 98.3 F (36.8 C)  TempSrc:    Oral  SpO2: 99% 98% 97% 98%  Weight:      Height:       Constitutional :  alert, cooperative, appears stated age, and no distress  Respiratory:  clear to auscultation bilaterally  Cardiovascular:  regular rate and rhythm  Gastrointestinal: Soft, no guarding, no TTP midline incision, no overlying skin changes noted, ostomy continues to be productive  Skin: Cool and moist.   Psychiatric: Normal affec, alert and oriented x3        Family Communication: none  Disposition: Status is: Inpatient Remains inpatient appropriate because: replacing bicarb  Planned Discharge Destination: Home    Time spent: 30 minutes  Author: Fritzi Mandes, MD 05/09/2022 9:22 AM  For on call review [email or web address].

## 2022-05-09 NOTE — Progress Notes (Signed)
Patient discharge instructions given and reviewed with patient who verbalized understanding.  Piv removed.

## 2022-05-15 ENCOUNTER — Ambulatory Visit: Admit: 2022-05-15 | Discharge: 2022-05-16 | Payer: MEDICARE

## 2022-05-15 DIAGNOSIS — N189 Chronic kidney disease, unspecified: Principal | ICD-10-CM

## 2022-05-15 DIAGNOSIS — E119 Type 2 diabetes mellitus without complications: Principal | ICD-10-CM

## 2022-05-15 LAB — CALPROTECTIN, FECAL: Calprotectin, Fecal: >800 ug/g — ABNORMAL HIGH (ref 0–120)

## 2022-06-05 ENCOUNTER — Ambulatory Visit: Admit: 2022-06-05 | Discharge: 2022-06-06 | Payer: MEDICARE

## 2022-06-05 DIAGNOSIS — N189 Chronic kidney disease, unspecified: Principal | ICD-10-CM

## 2022-06-20 ENCOUNTER — Ambulatory Visit: Admit: 2022-06-20 | Discharge: 2022-06-21 | Payer: MEDICARE | Attending: Gastroenterology | Primary: Gastroenterology

## 2022-06-20 DIAGNOSIS — K5080 Crohn's disease of both small and large intestine without complications: Principal | ICD-10-CM

## 2022-06-27 ENCOUNTER — Ambulatory Visit: Payer: Medicare Other | Admitting: Family

## 2022-07-11 MED ORDER — PANTOPRAZOLE 40 MG TABLET,DELAYED RELEASE
ORAL_TABLET | 2 refills | 0 days | Status: CP
Start: 2022-07-11 — End: ?

## 2022-07-15 ENCOUNTER — Ambulatory Visit: Admit: 2022-07-15 | Discharge: 2022-07-16 | Payer: MEDICARE

## 2022-07-17 ENCOUNTER — Ambulatory Visit: Admit: 2022-07-17 | Discharge: 2022-07-18 | Payer: MEDICARE

## 2022-07-17 DIAGNOSIS — K5080 Crohn's disease of both small and large intestine without complications: Principal | ICD-10-CM

## 2022-07-18 DIAGNOSIS — K769 Liver disease, unspecified: Principal | ICD-10-CM

## 2022-08-01 ENCOUNTER — Telehealth: Admit: 2022-08-01 | Payer: MEDICARE | Attending: Otolaryngology | Primary: Otolaryngology

## 2022-08-02 ENCOUNTER — Ambulatory Visit: Admit: 2022-08-02 | Discharge: 2022-08-02 | Payer: MEDICARE

## 2022-08-22 ENCOUNTER — Ambulatory Visit (INDEPENDENT_AMBULATORY_CARE_PROVIDER_SITE_OTHER): Payer: Medicare Other | Admitting: Dermatology

## 2022-08-22 DIAGNOSIS — D485 Neoplasm of uncertain behavior of skin: Secondary | ICD-10-CM | POA: Diagnosis not present

## 2022-08-22 DIAGNOSIS — R238 Other skin changes: Secondary | ICD-10-CM

## 2022-08-22 DIAGNOSIS — Z85828 Personal history of other malignant neoplasm of skin: Secondary | ICD-10-CM

## 2022-08-22 DIAGNOSIS — R600 Localized edema: Secondary | ICD-10-CM

## 2022-08-22 DIAGNOSIS — L821 Other seborrheic keratosis: Secondary | ICD-10-CM

## 2022-08-22 DIAGNOSIS — L570 Actinic keratosis: Secondary | ICD-10-CM | POA: Diagnosis not present

## 2022-08-22 DIAGNOSIS — Z1283 Encounter for screening for malignant neoplasm of skin: Secondary | ICD-10-CM

## 2022-08-22 DIAGNOSIS — L578 Other skin changes due to chronic exposure to nonionizing radiation: Secondary | ICD-10-CM

## 2022-08-22 DIAGNOSIS — D229 Melanocytic nevi, unspecified: Secondary | ICD-10-CM

## 2022-08-22 DIAGNOSIS — L814 Other melanin hyperpigmentation: Secondary | ICD-10-CM

## 2022-08-22 DIAGNOSIS — D1800 Hemangioma unspecified site: Secondary | ICD-10-CM

## 2022-08-22 NOTE — Patient Instructions (Addendum)
Cryotherapy Aftercare  Wash gently with soap and water everyday.   Apply Vaseline and Band-Aid daily until healed.  Wound Care Instructions  Cleanse wound gently with soap and water once a day then pat dry with clean gauze. Apply a thin coat of Petrolatum (petroleum jelly, "Vaseline") over the wound (unless you have an allergy to this). We recommend that you use a new, sterile tube of Vaseline. Do not pick or remove scabs. Do not remove the yellow or white "healing tissue" from the base of the wound.  Cover the wound with fresh, clean, nonstick gauze and secure with paper tape. You may use Band-Aids in place of gauze and tape if the wound is small enough, but would recommend trimming much of the tape off as there is often too much. Sometimes Band-Aids can irritate the skin.  You should call the office for your biopsy report after 1 week if you have not already been contacted.  If you experience any problems, such as abnormal amounts of bleeding, swelling, significant bruising, significant pain, or evidence of infection, please call the office immediately.  FOR ADULT SURGERY PATIENTS: If you need something for pain relief you may take 1 extra strength Tylenol (acetaminophen) AND 2 Ibuprofen (236m each) together every 4 hours as needed for pain. (do not take these if you are allergic to them or if you have a reason you should not take them.) Typically, you may only need pain medication for 1 to 3 days.      Due to recent changes in healthcare laws, you may see results of your pathology and/or laboratory studies on MyChart before the doctors have had a chance to review them. We understand that in some cases there may be results that are confusing or concerning to you. Please understand that not all results are received at the same time and often the doctors may need to interpret multiple results in order to provide you with the best plan of care or course of treatment. Therefore, we ask that you  please give u[place]2 business days to thoroughly review all your results before contacting the office for clarification. Should we see a critical lab result, you will be contacted sooner.   If You Need Anything After Your Visit  If you have any questions or concerns for your doctor, please call our main line at [phone] press option 4 to reach your doctor's medical assistant. If no one answers, please leave a voicemail as directed and we will return your call as soon as possible. Messages left after 4 pm will be answered the following business day.   You may also send [place] message via [place] We typically respond to MyChart messages within 1-2 business days.  For prescription refills, please ask your pharmacy to contact our office. Our fax number is [phone]  If you have an urgent issue when the clinic is closed that cannot wait until the next business day, you can page your doctor at the number below.    Please note that while we do our best to be available for urgent issues outside of office hours, we are not available 24/7.   If you have an urgent issue and are unable to reach u[place] you may choose to seek medical care at your doctor's office, retail clinic, urgent care center, or emergency room.  If you have a medical emergency, please immediately call 911 or go to the emergency department.  Pager Numbers  - Dr. KNehemiah Massed [phone] -  Dr. Laurence Ferrari: [phone]  - Dr. Nicole Kindred: [phone]  In the event of inclement weather, please call our main line at [phone] for an update on the status of any delays or closures.  Dermatology Medication Tips: Please keep the boxes that topical medications come in in order to help keep track of the instructions about where and how to use these. Pharmacies typically print the medication instructions only on the boxes and not directly on the medication tubes.   If your medication is too expensive, please contact our office at  [phone] option 4 or send [place] a message through [place].   We are unable to tell what your co-pay for medications will be in advance as this is different depending on your insurance coverage. However, we may be able to find a substitute medication at lower cost or fill out paperwork to get insurance to cover a needed medication.   If a prior authorization is required to get your medication covered by your insurance company, please allow [place] 1-2 business days to complete this process.  Drug prices often vary depending on where the prescription is filled and some pharmacies may offer cheaper prices.  The website www.goodrx.com contains coupons for medications through different pharmacies. The prices here do not account for what the cost may be with help from insurance (it may be cheaper with your insurance), but the website can give you the price if you did not use any insurance.  - You can print the associated coupon and take it with your prescription to the pharmacy.  - You may also stop by our office during regular business hours and pick up a GoodRx coupon card.  - If you need your prescription sent electronically to a different pharmacy, notify our office through [facility] or by phone at [phone] option 4.     Si Usted Necesita Algo Despus de Su Visita  Tambin puede enviarnos un mensaje a travs de Pharmacist, community. Por lo general respondemos a los mensajes de MyChart en el transcurso de 1 a 2 das hbiles.  Para renovar recetas, por favor pida a su farmacia que se ponga en contacto con nuestra oficina. Harland Dingwall de fax es [place] [phone].  Si tiene un asunto urgente cuando la clnica est cerrada y que no puede esperar hasta el siguiente da hbil, puede llamar/localizar a su doctor(a) al nmero que aparece a continuacin.   Por favor, tenga en cuenta que aunque hacemos todo lo posible para estar disponibles para asuntos urgentes fuera del horario de [place], no estamos  disponibles las 24 horas del da, los 7 das de la [place].   Si tiene un problema urgente y no puede comunicarse con nosotros, puede optar por buscar atencin mdica  en el consultorio de su doctor(a), en una clnica privada, en un centro de atencin urgente o en una sala de emergencias.  Si tiene Engineering geologist, por favor llame inmediatamente al 911 o vaya a la sala de emergencias.  Nmeros de bper  - Dr. Nehemiah Massed: [phone]  - Dra. Moye: [phone]  - Dra. Nicole Kindred: [phone]  En caso de inclemencias del [place], por favor llame a Johnsie Kindred principal al [phone] para una actualizacin sobre el [place] de cualquier retraso o cierre.  Consejos para la medicacin en dermatologa: Por favor, guarde las cajas en las que vienen los medicamentos de uso tpico para ayudarle a seguir las instrucciones sobre dnde y cmo usarlos. Las farmacias generalmente imprimen las instrucciones del medicamento slo en las cajas y  no directamente en los tubos del medicamento.   Si su medicamento es muy caro, por favor, pngase en contacto con Zigmund Daniel llamando al [phone] y presione la opcin 4 o envenos un mensaje a travs de Pharmacist, community.   No podemos decirle cul ser su copago por los medicamentos por adelantado ya que esto es diferente dependiendo de la cobertura de su seguro. Sin embargo, es posible que podamos encontrar un medicamento sustituto a Electrical engineer un formulario para que el seguro cubra el medicamento que se considera necesario.   Si se requiere una autorizacin previa para que su compaa de seguros [place] su medicamento, por favor permtanos de 1 a 2 das hbiles para completar este proceso.  Los precios de los medicamentos varan con frecuencia dependiendo del Environmental consultant de dnde se surte la receta y alguna farmacias pueden ofrecer precios ms baratos.  El sitio web www.goodrx.com tiene cupones para medicamentos de Airline pilot. Los precios aqu no  tienen en cuenta lo que podra costar con la ayuda del seguro (puede ser ms barato con su seguro), pero el sitio web puede darle el precio si no utiliz Research scientist (physical sciences).  - Puede imprimir el cupn correspondiente y llevarlo con su receta a la farmacia.  - Tambin puede pasar por nuestra oficina durante el horario de atencin regular y Charity fundraiser una tarjeta de cupones de GoodRx.  - Si necesita que su receta se enve electrnicamente a una farmacia diferente, informe a nuestra oficina a travs de MyChart de [place] o por telfono llamando al [phone] y presione la opcin 4.

## 2022-08-22 NOTE — Progress Notes (Signed)
Follow-Up Visit   Subjective  Dean Ryan is a 81 y.o. male who presents for the following: Annual Exam (Hx SCC, AK's ). The patient presents for Total-Body Skin Exam (TBSE) for skin cancer screening and mole check.  The patient has spots, moles and lesions to be evaluated, some may be new or changing.  The following portions of the chart were reviewed this encounter and updated as appropriate:   Tobacco  Allergies  Meds  Problems  Med Hx  Surg Hx  Fam Hx      Review of Systems:  No other skin or systemic complaints except as noted in HPI or Assessment and Plan.  Objective  Well appearing patient in no apparent distress; mood and affect are within normal limits.  A full examination was performed including scalp, head, eyes, ears, nose, lips, neck, chest, axillae, abdomen, back, buttocks, bilateral upper extremities, bilateral lower extremities, hands, feet, fingers, toes, fingernails, and toenails. All findings within normal limits unless otherwise noted below.  Vertex scalp x 8, R frontal hair line x 1, R temporal scalp x 1, L anti helix x 1, L helix x 4, L lower back x 1, R lower back x 1 (17) Erythematous thin papules/macules with gritty scale.   R nasal tip 0.5 cm pink papule     B/L leg 1 + pedal edema lower extremities. 2 + pedal pulses.     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 They are chronic with expected duration over 1 year. A portion of actinic keratoses will progress to squamous cell carcinoma of the skin. It is not possible to reliably predict which spots will progress to skin cancer and so treatment is recommended to prevent development of skin cancer.  Recommend daily broad spectrum  sunscreen SPF 30+ to sun-exposed areas, reapply every 2 hours as needed.  Recommend staying in the shade or wearing long sleeves, sun glasses (UVA+UVB protection) and wide brim hats (4-inch brim around the entire circumference of the hat). Call for new or changing lesions.  Prior to procedure, discussed risks of blister formation, small wound, skin dyspigmentation, or rare scar following cryotherapy. Recommend Vaseline ointment to treated areas while healing.   Destruction of lesion - Vertex scalp x 8, R frontal hair line x 1, R temporal scalp x 1, L anti helix x 1, L helix x 4, L lower back x 1, R lower back x 1 Complexity: simple   Destruction method: cryotherapy   Informed consent: discussed and consent obtained   Timeout:  patient name, date of birth, surgical site, and procedure verified Lesion destroyed using liquid nitrogen: Yes   Region frozen until ice ball extended beyond lesion: Yes   Outcome: patient tolerated procedure well with no complications   Post-procedure details: wound care instructions given    Neoplasm of uncertain behavior of skin R nasal tip  Skin / nail biopsy Type of biopsy: tangential   Informed consent: discussed and consent obtained   Timeout: patient name, date of birth, surgical site, and procedure verified   Procedure prep:  Patient was prepped and draped in usual sterile fashion Prep type:  Isopropyl alcohol Anesthesia: the lesion was anesthetized in a  standard fashion   Anesthetic:  1% lidocaine w/ epinephrine 1-100,000 buffered w/ 8.4% NaHCO3 Instrument used: flexible razor blade   Hemostasis achieved with: pressure, aluminum chloride and electrodesiccation   Outcome: patient tolerated procedure well   Post-procedure details: sterile dressing applied and wound care instructions given   Dressing type: bandage and petrolatum    Specimen 1 - Surgical pathology Differential Diagnosis: D48.5 r/o BCC Check Margins: No  Edema of both lower legs B/L  leg  Patient to follow up with PCP. Continue compression stockings daily.  No stasis dermatitis today - monitor. Patient to call if he develops a rash.  Skin irritation L abdomen  Due to irritation from ostomy - will consult with other dermatologist to see if there is anything more helpful than what patient is currently using. He has had his ostomy bag since 1997.   Lentigines - Scattered tan macules - Due to sun exposure - Benign-appearing, observe - Recommend daily broad spectrum sunscreen SPF 30+ to sun-exposed areas, reapply every 2 hours as needed. - Call for any changes  Seborrheic Keratoses - Stuck-on, waxy, tan-brown papules and/or plaques  - Benign-appearing - Discussed benign etiology and prognosis. - Observe - Call for any changes  Melanocytic Nevi - Tan-brown and/or pink-flesh-colored symmetric macules and papules - Benign appearing on exam today - Observation - Call clinic for new or changing moles - Recommend daily use of broad spectrum spf 30+ sunscreen to sun-exposed areas.   Hemangiomas - Red papules - Discussed benign nature - Observe - Call for any changes  Actinic Damage - Severe, confluent actinic changes with pre-cancerous actinic keratoses  - Severe, chronic, not at goal, secondary to cumulative UV radiation exposure over time - diffuse scaly erythematous macules and papules with underlying dyspigmentation - Discussed Prescription "Field Treatment" for Severe, Chronic Confluent Actinic Changes with Pre-Cancerous Actinic Keratoses Field treatment involves treatment of an entire area of skin that has confluent Actinic Changes (Sun/ Ultraviolet light damage) and PreCancerous Actinic Keratoses by method of PhotoDynamic Therapy (PDT) and/or prescription Topical Chemotherapy agents such as 5-fluorouracil, 5-fluorouracil/calcipotriene, and/or imiquimod.  The purpose is to decrease the number of clinically evident and subclinical PreCancerous lesions to  prevent progression to development of skin cancer by chemically destroying early precancer changes that may or may not be visible.  It has been shown to reduce the risk of developing skin cancer in the treated area. As a result of treatment, redness, scaling, crusting, and open sores may occur during treatment course. One or more than one of these methods may be used and may have to be used several times to control, suppress and eliminate the PreCancerous changes. Discussed treatment course, expected reaction, and possible side effects. - Recommend daily broad spectrum sunscreen SPF 30+ to sun-exposed areas, reapply every 2 hours as needed.  - Staying in the shade or wearing long sleeves, sun glasses (UVA+UVB protection) and wide brim hats (4-inch brim around the entire circumference of the hat) are also recommended. - Call for new or changing lesions. - Plan PDT with red light once available at our office to the face, forehead, and hands. We will contact him when ready to schedule.  History of Squamous Cell Carcinoma of the Skin - No evidence of recurrence today - No lymphadenopathy - Recommend regular full body skin exams - Recommend daily broad spectrum sunscreen SPF 30+ to sun-exposed areas, reapply every 2 hours as needed.  - Call if any new or changing lesions are noted between office visits  Skin cancer screening performed today.  Return in about 6 months (around 02/20/2023) for TBSE - hx SCC, AK's .  Luther Redo, CMA, am acting as scribe for Forest Gleason, MD .  Documentation: I have reviewed the above documentation for accuracy and completeness, and I agree with the above.  Forest Gleason, MD

## 2022-08-29 ENCOUNTER — Telehealth: Payer: Self-pay

## 2022-08-29 NOTE — Telephone Encounter (Signed)
Called patient. LMOVM to C/B. 08/29/2022. 10:30 JP

## 2022-08-29 NOTE — Telephone Encounter (Signed)
-----   Message from [place], MD sent at 08/29/2022 10:14 AM EDT ----- SEBACEOUS NEOPLASM WITH ATYPICAL FEATURES --> recommend repeat punch biopsy to be sure we have an adequate sample and there's no cancer  MAs please call and schedule. Thank you!

## 2022-08-30 ENCOUNTER — Encounter: Payer: Self-pay | Admitting: Dermatology

## 2022-09-03 MED ORDER — PEG 3350-ELECTROLYTES 236 GRAM-22.74 GRAM-6.74 GRAM-5.86 GRAM SOLUTION
Freq: Once | ORAL | 0 refills | 1 days | Status: CP
Start: 2022-09-03 — End: 2022-09-03

## 2022-09-05 ENCOUNTER — Encounter: Payer: Self-pay | Admitting: Dermatology

## 2022-09-06 MED FILL — PEG 3350-ELECTROLYTES 236 GRAM-22.74 GRAM-6.74 GRAM-5.86 GRAM SOLUTION: ORAL | 1 days supply | Qty: 4000 | Fill #0

## 2022-09-12 ENCOUNTER — Ambulatory Visit: Payer: Medicare Other | Admitting: Family

## 2022-09-13 ENCOUNTER — Encounter: Admit: 2022-09-13 | Discharge: 2022-09-13 | Payer: MEDICARE

## 2022-09-13 ENCOUNTER — Ambulatory Visit: Admit: 2022-09-13 | Discharge: 2022-09-13 | Payer: MEDICARE

## 2022-09-16 ENCOUNTER — Telehealth: Payer: Self-pay

## 2022-09-16 ENCOUNTER — Ambulatory Visit: Payer: Medicare Other | Admitting: Family

## 2022-09-16 NOTE — Telephone Encounter (Signed)
Patient returned [place] phone call from September. Returned call but unable to reach patient. Left voicemail to call back to the office. aw

## 2022-09-16 NOTE — Telephone Encounter (Signed)
-----   Message from [place], MD sent at 08/29/2022 10:14 AM EDT ----- SEBACEOUS NEOPLASM WITH ATYPICAL FEATURES --> recommend repeat punch biopsy to be sure we have an adequate sample and there's no cancer  MAs please call and schedule. Thank you!

## 2022-09-17 ENCOUNTER — Ambulatory Visit (INDEPENDENT_AMBULATORY_CARE_PROVIDER_SITE_OTHER): Payer: Medicare Other | Admitting: Dermatology

## 2022-09-17 ENCOUNTER — Ambulatory Visit: Payer: Medicare Other | Admitting: Dermatology

## 2022-09-17 DIAGNOSIS — D485 Neoplasm of uncertain behavior of skin: Secondary | ICD-10-CM | POA: Diagnosis not present

## 2022-09-17 NOTE — Patient Instructions (Signed)
Wound Care Instructions  Cleanse wound gently with soap and water once a day then pat dry with clean gauze. Apply a thin coat of Petrolatum (petroleum jelly, "Vaseline") over the wound (unless you have an allergy to this). We recommend that you use a new, sterile tube of Vaseline. Do not pick or remove scabs. Do not remove the yellow or white "healing tissue" from the base of the wound.  Cover the wound with fresh, clean, nonstick gauze and secure with paper tape. You may use Band-Aids in place of gauze and tape if the wound is small enough, but would recommend trimming much of the tape off as there is often too much. Sometimes Band-Aids can irritate the skin.  You should call the office for your biopsy report after 1 week if you have not already been contacted.  If you experience any problems, such as abnormal amounts of bleeding, swelling, significant bruising, significant pain, or evidence of infection, please call the office immediately.  FOR ADULT SURGERY PATIENTS: If you need something for pain relief you may take 1 extra strength Tylenol (acetaminophen) AND 2 Ibuprofen (249m each) together every 4 hours as needed for pain. (do not take these if you are allergic to them or if you have a reason you should not take them.) Typically, you may only need pain medication for 1 to 3 days.   Due to recent changes in healthcare laws, you may see results of your pathology and/or laboratory studies on MyChart before the doctors have had a chance to review them. We understand that in some cases there may be results that are confusing or concerning to you. Please understand that not all results are received at the same time and often the doctors may need to interpret multiple results in order to provide you with the best plan of care or course of treatment. Therefore, we ask that you please give u[place]2 business days to thoroughly review all your results before contacting the office for clarification. Should we  see a critical lab result, you will be contacted sooner.   If You Need Anything After Your Visit  If you have any questions or concerns for your doctor, please call our main line at [phone] press option 4 to reach your doctor's medical assistant. If no one answers, please leave a voicemail as directed and we will return your call as soon as possible. Messages left after 4 pm will be answered the following business day.   You may also send [place] message via [place] We typically respond to MyChart messages within 1-2 business days.  For prescription refills, please ask your pharmacy to contact our office. Our fax number is [phone]  If you have an urgent issue when the clinic is closed that cannot wait until the next business day, you can page your doctor at the number below.    Please note that while we do our best to be available for urgent issues outside of office hours, we are not available 24/7.   If you have an urgent issue and are unable to reach u[place] you may choose to seek medical care at your doctor's office, retail clinic, urgent care center, or emergency room.  If you have a medical emergency, please immediately call 911 or go to the emergency department.  Pager Numbers  - Dr. KNehemiah Massed [phone] - Dr. MLaurence Ferrari [phone] - Dr. SNicole Kindred [phone] In the event of inclement weather, please call our main line at [phone]  an update on the status of any delays or closures.  Dermatology Medication Tips: Please keep the boxes that topical medications come in in order to help keep track of the instructions about where and how to use these. Pharmacies typically print the medication instructions only on the boxes and not directly on the medication tubes.   If your medication is too expensive, please contact our office at [phone] option 4 or send [place] a message through [place].   We are unable to tell what your co-pay for medications will be in advance  as this is different depending on your insurance coverage. However, we may be able to find a substitute medication at lower cost or fill out paperwork to get insurance to cover a needed medication.   If a prior authorization is required to get your medication covered by your insurance company, please allow [place] 1-2 business days to complete this process.  Drug prices often vary depending on where the prescription is filled and some pharmacies may offer cheaper prices.  The website www.goodrx.com contains coupons for medications through different pharmacies. The prices here do not account for what the cost may be with help from insurance (it may be cheaper with your insurance), but the website can give you the price if you did not use any insurance.  - You can print the associated coupon and take it with your prescription to the pharmacy.  - You may also stop by our office during regular business hours and pick up a GoodRx coupon card.  - If you need your prescription sent electronically to a different pharmacy, notify our office through [facility] or by phone at [phone] option 4.     Si Usted Necesita Algo Despus de Su Visita  Tambin puede enviarnos un mensaje a travs de Pharmacist, community. Por lo general respondemos a los mensajes de MyChart en el transcurso de 1 a 2 das hbiles.  Para renovar recetas, por favor pida a su farmacia que se ponga en contacto con nuestra oficina. Harland Dingwall de fax es [place] [phone].  Si tiene un asunto urgente cuando la clnica est cerrada y que no puede esperar hasta el siguiente da hbil, puede llamar/localizar a su doctor(a) al nmero que aparece a continuacin.   Por favor, tenga en cuenta que aunque hacemos todo lo posible para estar disponibles para asuntos urgentes fuera del horario de [place], no estamos disponibles las 24 horas del da, los 7 das de la [place].   Si tiene un problema urgente y no puede comunicarse con nosotros, puede optar  por buscar atencin mdica  en el consultorio de su doctor(a), en una clnica privada, en un centro de atencin urgente o en una sala de emergencias.  Si tiene Engineering geologist, por favor llame inmediatamente al 911 o vaya a la sala de emergencias.  Nmeros de bper  - Dr. Nehemiah Massed: [phone]  - Dra. Moye: [phone]  - Dra. Nicole Kindred: [phone]  En caso de inclemencias del [place], por favor llame a Johnsie Kindred principal al [phone] para una actualizacin sobre el [place] de cualquier retraso o cierre.  Consejos para la medicacin en dermatologa: Por favor, guarde las cajas en las que vienen los medicamentos de uso tpico para ayudarle a seguir las instrucciones sobre dnde y cmo usarlos. Las farmacias generalmente imprimen las instrucciones del medicamento slo en las cajas y no directamente en los tubos del [place].   Si su medicamento es [employer], por favor, pngase en contacto con Cleotis Nipper oficina  llamando al [phone] y presione la opcin 4 o envenos un mensaje a travs de Pharmacist, community.   No podemos decirle cul ser su copago por los medicamentos por adelantado ya que esto es diferente dependiendo de la cobertura de su seguro. Sin embargo, es posible que podamos encontrar un medicamento sustituto a Electrical engineer un formulario para que el seguro cubra el medicamento que se considera necesario.   Si se requiere una autorizacin previa para que su compaa de seguros [place] su medicamento, por favor permtanos de 1 a 2 das hbiles para completar este proceso.  Los precios de los medicamentos varan con frecuencia dependiendo del Environmental consultant de dnde se surte la receta y alguna farmacias pueden ofrecer precios ms baratos.  El sitio web www.goodrx.com tiene cupones para medicamentos de Airline pilot. Los precios aqu no tienen en cuenta lo que podra costar con la ayuda del seguro (puede ser ms barato con su seguro), pero el sitio web puede darle el precio si  no utiliz Research scientist (physical sciences).  - Puede imprimir el cupn correspondiente y llevarlo con su receta a la farmacia.  - Tambin puede pasar por nuestra oficina durante el horario de atencin regular y Charity fundraiser una tarjeta de cupones de GoodRx.  - Si necesita que su receta se enve electrnicamente a una farmacia diferente, informe a nuestra oficina a travs de MyChart de [place] o por telfono llamando al [phone] y presione la opcin 4.

## 2022-09-17 NOTE — Progress Notes (Signed)
   Follow-Up Visit   Subjective  Dean Ryan is a 81 y.o. male who presents for the following: bx proven sebaceous neoplasm with atypical features (Of the R nasal tip, patient is here today for repeat punch biopsy to be sure we have an adequate sample and there's no cancer).   The following portions of the chart were reviewed this encounter and updated as appropriate:   Tobacco  Allergies  Meds  Problems  Med Hx  Surg Hx  Fam Hx      Review of Systems:  No other skin or systemic complaints except as noted in HPI or Assessment and Plan.  Objective  Well appearing patient in no apparent distress; mood and affect are within normal limits.  A focused examination was performed including the face. Relevant physical exam findings are noted in the Assessment and Plan.  R nasal tip Biopsy site within yellow-pink papule Confirmed site with previous photo.    Assessment & Plan  Neoplasm of uncertain behavior of skin R nasal tip  Skin / nail biopsy Type of biopsy: punch   Informed consent: discussed and consent obtained   Timeout: patient name, date of birth, surgical site, and procedure verified   Procedure prep:  Patient was prepped and draped in usual sterile fashion (the patient was cleaned and prepped) Prep type:  Isopropyl alcohol Anesthesia: the lesion was anesthetized in a standard fashion   Anesthetic:  1% lidocaine w/ epinephrine 1-100,000 buffered w/ 8.4% NaHCO3 Punch size:  2.5 mm Suture size:  5-0 Suture type: nylon   Hemostasis achieved with: suture, pressure and aluminum chloride   Outcome: patient tolerated procedure well   Post-procedure details: sterile dressing applied and wound care instructions given   Dressing type: bandage, petrolatum and pressure dressing    Specimen 1 - Surgical pathology Differential Diagnosis: Bx proven sebaceous neoplasm with atypical features [id number] Check Margins:  Yes   Return in about 2 weeks (around 10/01/2022) for  suture removal.  I, Rudell Cobb, CMA, am acting as scribe for Forest Gleason, MD .   Documentation: I have reviewed the above documentation for accuracy and completeness, and I agree with the above.  Forest Gleason, MD

## 2022-09-18 ENCOUNTER — Ambulatory Visit: Admit: 2022-09-18 | Discharge: 2022-09-19 | Payer: MEDICARE

## 2022-09-18 DIAGNOSIS — M818 Other osteoporosis without current pathological fracture: Principal | ICD-10-CM

## 2022-09-19 DIAGNOSIS — D492 Neoplasm of unspecified behavior of bone, soft tissue, and skin: Secondary | ICD-10-CM

## 2022-09-19 HISTORY — DX: Neoplasm of unspecified behavior of bone, soft tissue, and skin: D49.2

## 2022-09-20 ENCOUNTER — Encounter: Payer: Self-pay | Admitting: Dermatology

## 2022-09-23 ENCOUNTER — Telehealth: Payer: Self-pay | Admitting: Dermatology

## 2022-09-23 NOTE — Telephone Encounter (Signed)
Abby, could you please call [facility] Mohs surgery and see if they could treat this? They cannot rule out sebaceous carcinoma based on two biopsies. Thank you!   Skin , right nasal tip SEBACEOUS NEOPLASM WITH ATYPICAL FEATURES, PERIPHERAL AND DEEP MARGINS INVOLVED, SEE DESCRIPTION Microscopic Description This neoplasm consists of large aggregates composed of two cell types, one basaloid and the other sebaceous. There is some cytologic atypia and scattered mitotic figures are seen. The lesion is generally well circumscribed. These are the findings of a sebaceous neoplasm that has some atypical features which some may consider a sebaceoma or a sebaceous epithelioma. Definite changes of sebaceous carcinoma are not seen.

## 2022-09-24 ENCOUNTER — Telehealth: Payer: Self-pay | Admitting: Dermatology

## 2022-09-24 ENCOUNTER — Encounter: Payer: Self-pay | Admitting: Dermatology

## 2022-09-24 NOTE — Telephone Encounter (Signed)
Can you please call [facility] and ask if they can do Immunohistochemical analysis for loss of MMR genes (if this is still considered appropriate) to see if it suggests [place]? Thank you!

## 2022-09-24 NOTE — Telephone Encounter (Signed)
Referral e-mail with patients information and pathology sent to jennifer at [facility] for review to determine if patient is a candidate for [facility] surgery.

## 2022-09-25 ENCOUNTER — Other Ambulatory Visit: Payer: Self-pay

## 2022-09-25 DIAGNOSIS — C44311 Basal cell carcinoma of skin of nose: Principal | ICD-10-CM

## 2022-09-25 DIAGNOSIS — D485 Neoplasm of uncertain behavior of skin: Secondary | ICD-10-CM

## 2022-09-25 NOTE — Progress Notes (Signed)
Referral sent to Genetic Counseling at [place], [facility]

## 2022-09-30 DIAGNOSIS — K5080 Crohn's disease of both small and large intestine without complications: Principal | ICD-10-CM

## 2022-10-01 ENCOUNTER — Ambulatory Visit: Payer: Medicare Other | Admitting: Dermatology

## 2022-10-02 ENCOUNTER — Ambulatory Visit (INDEPENDENT_AMBULATORY_CARE_PROVIDER_SITE_OTHER): Payer: Medicare Other | Admitting: Dermatology

## 2022-10-02 DIAGNOSIS — L570 Actinic keratosis: Secondary | ICD-10-CM | POA: Diagnosis not present

## 2022-10-02 DIAGNOSIS — L578 Other skin changes due to chronic exposure to nonionizing radiation: Secondary | ICD-10-CM | POA: Diagnosis not present

## 2022-10-02 NOTE — Progress Notes (Signed)
Follow-Up Visit   Subjective  Dean Ryan is a 81 y.o. male who presents for the following: Suture / Staple Removal (Here today for follow up on bx at right nasal tip. /Patient has appointment with patient on 31st with Dr. Manley Mason for Mohs surgery. Patient has gotten call from genetic counseling ).  The following portions of the chart were reviewed this encounter and updated as appropriate:  Tobacco  Allergies  Meds  Problems  Med Hx  Surg Hx  Fam Hx      Review of Systems: No other skin or systemic complaints except as noted in HPI or Assessment and Plan.   Objective  Well appearing patient in no apparent distress; mood and affect are within normal limits.  A focused examination was performed including face, arms, ears, scalp, back. Relevant physical exam findings are noted in the Assessment and Plan.  vertex scalp 6, right ear helix x 1, left ear helix x 1, left lower back x 1 (9) Erythematous thin papules/macules with gritty scale.    Assessment & Plan  Actinic keratosis (9) vertex scalp 6, right ear helix x 1, left ear helix x 1, left lower back x 1  Actinic keratoses are precancerous spots that appear secondary to cumulative UV radiation exposure/sun exposure over time. They are chronic with expected duration over 1 year. A portion of actinic keratoses will progress to squamous cell carcinoma of the skin. It is not possible to reliably predict which spots will progress to skin cancer and so treatment is recommended to prevent development of skin cancer.  Recommend daily broad spectrum sunscreen SPF 30+ to sun-exposed areas, reapply every 2 hours as needed.  Recommend staying in the shade or wearing long sleeves, sun glasses (UVA+UVB protection) and wide brim hats (4-inch brim around the entire circumference of the hat). Call for new or changing lesions.  Destruction of lesion - vertex scalp 6, right ear helix x 1, left ear helix x 1, left lower back x 1  Destruction  method: cryotherapy   Informed consent: discussed and consent obtained   Lesion destroyed using liquid nitrogen: Yes   Cryotherapy cycles:  2 Outcome: patient tolerated procedure well with no complications   Post-procedure details: wound care instructions given   Additional details:  Prior to procedure, discussed risks of blister formation, small wound, skin dyspigmentation, or rare scar following cryotherapy. Recommend Vaseline ointment to treated areas while healing.    Encounter for Removal of Sutures - Incision site at the right nasal tip is clean, dry and intact - Wound cleansed, sutures removed, wound cleansed and steri strips applied.  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 Manley Mason.   - Patient advised to keep steri-strips dry until they fall off. - Scars remodel for a full year. - Once steri-strips fall off, patient can apply over-the-counter silicone scar cream each night to help with scar remodeling if desired. - Patient advised to call with any concerns or if they notice any new or changing lesions.  Actinic Damage - Severe, confluent actinic changes with pre-cancerous actinic keratoses  - Severe, chronic, not at goal, secondary to cumulative UV radiation exposure over time - diffuse scaly erythematous macules and papules with underlying dyspigmentation - Discussed Prescription "Field Treatment" for Severe, Chronic Confluent Actinic Changes with Pre-Cancerous Actinic Keratoses Field treatment involves treatment of an entire area of skin that has confluent  Actinic Changes (Sun/ Ultraviolet light damage) and PreCancerous Actinic Keratoses by method of PhotoDynamic Therapy (PDT) and/or prescription Topical Chemotherapy agents such as 5-fluorouracil, 5-fluorouracil/calcipotriene, and/or imiquimod.  The purpose is to decrease the number of clinically  evident and subclinical PreCancerous lesions to prevent progression to development of skin cancer by chemically destroying early precancer changes that may or may not be visible.  It has been shown to reduce the risk of developing skin cancer in the treated area. As a result of treatment, redness, scaling, crusting, and open sores may occur during treatment course. One or more than one of these methods may be used and may have to be used several times to control, suppress and eliminate the PreCancerous changes. Discussed treatment course, expected reaction, and possible side effects. - Recommend daily broad spectrum sunscreen SPF 30+ to sun-exposed areas, reapply every 2 hours as needed.  - Staying in the shade or wearing long sleeves, sun glasses (UVA+UVB protection) and wide brim hats (4-inch brim around the entire circumference of the hat) are also recommended. - Call for new or changing lesions. - Plan PDT with red light once available at our office to the face, forehead, and hands. We will contact him when ready to schedule.  Return for keep follow up as scheduled 02/20/23.  I, Ruthell Rummage, CMA, am acting as scribe for Forest Gleason, MD.  Documentation: I have reviewed the above documentation for accuracy and completeness, and I agree with the above.  Forest Gleason, MD

## 2022-10-02 NOTE — Patient Instructions (Addendum)
Actinic keratoses are precancerous spots that appear secondary to cumulative UV radiation exposure/sun exposure over time. They are chronic with expected duration over 1 year. A portion of actinic keratoses will progress to squamous cell carcinoma of the skin. It is not possible to reliably predict which spots will progress to skin cancer and so treatment is recommended to prevent development of skin cancer.  Recommend daily broad spectrum sunscreen SPF 30+ to sun-exposed areas, reapply every 2 hours as needed.  Recommend staying in the shade or wearing long sleeves, sun glasses (UVA+UVB protection) and wide brim hats (4-inch brim around the entire circumference of the hat). Call for new or changing lesions.   Cryotherapy Aftercare  Wash gently with soap and water everyday.   Apply Vaseline and Band-Aid daily until healed.          After Suture Removal  If your medical team has placed Steri-Strips (white adhesive strips covering the surgical site to provide extra support): Keep the area dry until they fall off.  Do not peel them off. Just let them fall off on their own.  If the edges peel up, you can trim them with scissors.   If your team has not placed Steri-Strips: Wash the area daily with soap and water. Then coat the incision site with plain Vaseline and cover with a bandage. Do this daily for 5 days after the sutures are removed. After that, no additional wound care is generally needed.  However, if you would like to help fade the scar, you can apply a silicone scar cream, gel or sheet every night. The scar will remodel for one year after the procedure. If a skin cancer was removed, be sure to keep your appointment with your dermatologist for follow-up and let your dermatology team know if you have any new or changing spots between visits.    Please call our office at [phone] for any questions or concerns.Due to recent changes in healthcare laws, you may see results of  your pathology and/or laboratory studies on MyChart before the doctors have had a chance to review them. We understand that in some cases there may be results that are confusing or concerning to you. Please understand that not all results are received at the same time and often the doctors may need to interpret multiple results in order to provide you with the best plan of care or course of treatment. Therefore, we ask that you please give [place] 2 business days to thoroughly review all your results before contacting the office for clarification. Should we see a critical lab result, you will be contacted sooner.   If You Need Anything After Your Visit  If you have any questions or concerns for your doctor, please call our main line at [phone] and press option 4 to reach your doctor's medical assistant. If no one answers, please leave a voicemail as directed and we will return your call as soon as possible. Messages left after 4 pm will be answered the following business day.   You may also send [place] a message via [place]. We typically respond to MyChart messages within 1-2 business days.  For prescription refills, please ask your pharmacy to contact our office. Our fax number is [phone].  If you have an urgent issue when the clinic is closed that cannot wait until the next business day, you can page your doctor at the number below.    Please note that while we do our best to be available for urgent  issues outside of office hours, we are not available 24/7.   If you have an urgent issue and are unable to reach [place], you may choose to seek medical care at your doctor's office, retail clinic, urgent care center, or emergency room.  If you have a medical emergency, please immediately call 911 or go to the emergency department.  Pager Numbers  - Dr. Nehemiah Massed: [phone]  - Dr. Laurence Ferrari: [phone]  - Dr. Nicole Kindred: [phone]  In the event of inclement weather, please call our main line at  [phone] for an update on the status of any delays or closures.  Dermatology Medication Tips: Please keep the boxes that topical medications come in in order to help keep track of the instructions about where and how to use these. Pharmacies typically print the medication instructions only on the boxes and not directly on the medication tubes.   If your medication is too expensive, please contact our office at [phone] option 4 or send [place] a message through [place].   We are unable to tell what your co-pay for medications will be in advance as this is different depending on your insurance coverage. However, we may be able to find a substitute medication at lower cost or fill out paperwork to get insurance to cover a needed medication.   If a prior authorization is required to get your medication covered by your insurance company, please allow [place] 1-2 business days to complete this process.  Drug prices often vary depending on where the prescription is filled and some pharmacies may offer cheaper prices.  The website www.goodrx.com contains coupons for medications through different pharmacies. The prices here do not account for what the cost may be with help from insurance (it may be cheaper with your insurance), but the website can give you the price if you did not use any insurance.  - You can print the associated coupon and take it with your prescription to the pharmacy.  - You may also stop by our office during regular business hours and pick up a GoodRx coupon card.  - If you need your prescription sent electronically to a different pharmacy, notify our office through [facility] or by phone at [phone] option 4.     Si Usted Necesita Algo Despus de Su Visita  Tambin puede enviarnos un mensaje a travs de Pharmacist, community. Por lo general respondemos a los mensajes de MyChart en el transcurso de 1 a 2 das hbiles.  Para renovar recetas, por favor pida a su farmacia que se  ponga en contacto con nuestra oficina. Harland Dingwall de fax es [place] [phone].  Si tiene un asunto urgente cuando la clnica est cerrada y que no puede esperar hasta el siguiente da hbil, puede llamar/localizar a su doctor(a) al nmero que aparece a continuacin.   Por favor, tenga en cuenta que aunque hacemos todo lo posible para estar disponibles para asuntos urgentes fuera del horario de [place], no estamos disponibles las 24 horas del da, los 7 das de la [place].   Si tiene un problema urgente y no puede comunicarse con nosotros, puede optar por buscar atencin mdica  en el consultorio de su doctor(a), en una clnica privada, en un centro de atencin urgente o en una sala de emergencias.  Si tiene Engineering geologist, por favor llame inmediatamente al 911 o vaya a la sala de emergencias.  Nmeros de bper  - Dr. Nehemiah Massed: [phone]  - Dra. Moye: [phone]  - Dra. Nicole Kindred: [phone]  En  caso de inclemencias del [place], por favor llame a nuestra lnea principal al [phone] para una actualizacin sobre el estado de cualquier retraso o cierre.  Consejos para la medicacin en dermatologa: Por favor, guarde las cajas en las que vienen los medicamentos de uso tpico para ayudarle a seguir las instrucciones sobre dnde y cmo usarlos. Las farmacias generalmente imprimen las instrucciones del medicamento slo en las cajas y no directamente en los tubos del [place].   Si su medicamento es muy caro, por favor, pngase en contacto con Zigmund Daniel llamando al [phone] y presione la opcin 4 o envenos un mensaje a travs de Pharmacist, community.   No podemos decirle cul ser su copago por los medicamentos por adelantado ya que esto es diferente dependiendo de la cobertura de su seguro. Sin embargo, es posible que podamos encontrar un medicamento sustituto a Electrical engineer un formulario para que el seguro cubra el medicamento que se considera necesario.   Si se requiere  una autorizacin previa para que su compaa de seguros [place] su medicamento, por favor permtanos de 1 a 2 das hbiles para completar este proceso.  Los precios de los medicamentos varan con frecuencia dependiendo del Environmental consultant de dnde se surte la receta y alguna farmacias pueden ofrecer precios ms baratos.  El sitio web www.goodrx.com tiene cupones para medicamentos de Airline pilot. Los precios aqu no tienen en cuenta lo que podra costar con la ayuda del seguro (puede ser ms barato con su seguro), pero el sitio web puede darle el precio si no utiliz Research scientist (physical sciences).  - Puede imprimir el cupn correspondiente y llevarlo con su receta a la farmacia.  - Tambin puede pasar por nuestra oficina durante el horario de atencin regular y Charity fundraiser una tarjeta de cupones de GoodRx.  - Si necesita que su receta se enve electrnicamente a una farmacia diferente, informe a nuestra oficina a travs de MyChart de  o por telfono llamando al [phone] y presione la opcin 4.

## 2022-10-07 ENCOUNTER — Encounter: Payer: Self-pay | Admitting: Dermatology

## 2022-10-07 NOTE — Telephone Encounter (Signed)
Can you please call back [facility] and check on the status of the IHC staining? Thank you!

## 2022-10-08 ENCOUNTER — Ambulatory Visit: Admit: 2022-10-08 | Discharge: 2022-10-09 | Payer: MEDICARE

## 2022-10-08 ENCOUNTER — Telehealth: Payer: Self-pay

## 2022-10-08 NOTE — Telephone Encounter (Signed)
Portia from [facility] returned my call and said that the case is complicated and is still processing. Lurlean Horns., RMA

## 2022-10-08 NOTE — Telephone Encounter (Addendum)
LVM for [facility] asking them to call our office to give status for IHC staining.  Lurlean Horns., RMA

## 2022-10-08 NOTE — Telephone Encounter (Signed)
Dr. Laurence Ferrari requested Abby to call [facility] to see when Dr. Manley Mason would OK patient to have redlight therapy done.  Patient scheduled for [facility] today. Called office and left message to confirm with Dr. Shelda Jakes please.

## 2022-10-09 ENCOUNTER — Telehealth: Payer: Self-pay

## 2022-10-09 NOTE — Telephone Encounter (Signed)
There is only a partial note. Can we hunt down the rest of the note? Thank you!

## 2022-10-09 NOTE — Telephone Encounter (Signed)
Thanks

## 2022-10-09 NOTE — Telephone Encounter (Signed)
Encounter notes from Dr. Ishmael Holter scanned in under the media tab for your review

## 2022-10-16 ENCOUNTER — Ambulatory Visit: Admit: 2022-10-16 | Discharge: 2022-10-17 | Payer: MEDICARE

## 2022-10-16 ENCOUNTER — Ambulatory Visit
Admit: 2022-10-16 | Discharge: 2022-10-17 | Payer: MEDICARE | Attending: Student in an Organized Health Care Education/Training Program | Primary: Student in an Organized Health Care Education/Training Program

## 2022-10-16 DIAGNOSIS — N1832 Chronic kidney disease, stage 3b (CMS-HCC): Principal | ICD-10-CM

## 2022-10-16 DIAGNOSIS — R0600 Dyspnea, unspecified: Principal | ICD-10-CM

## 2022-10-16 DIAGNOSIS — I429 Cardiomyopathy, unspecified: Principal | ICD-10-CM

## 2022-10-16 DIAGNOSIS — N189 Chronic kidney disease, unspecified: Principal | ICD-10-CM

## 2022-10-22 ENCOUNTER — Inpatient Hospital Stay: Payer: Medicare Other | Admitting: Licensed Clinical Social Worker

## 2022-10-22 ENCOUNTER — Inpatient Hospital Stay: Payer: Medicare Other

## 2022-10-22 ENCOUNTER — Telehealth: Payer: Self-pay | Admitting: Licensed Clinical Social Worker

## 2022-10-22 NOTE — Telephone Encounter (Signed)
Patient called to cancel his 1 pm appt with Genetics. He got answering service and they sent a message.

## 2022-10-23 ENCOUNTER — Ambulatory Visit: Admit: 2022-10-23 | Discharge: 2022-11-07 | Disposition: A | Discharge status: Home / Self Care | Payer: MEDICARE

## 2022-10-23 ENCOUNTER — Ambulatory Visit: Admit: 2022-10-23 | Payer: MEDICARE

## 2022-10-23 ENCOUNTER — Ambulatory Visit: Admit: 2022-10-23 | Discharge: 2022-10-24 | Payer: MEDICARE

## 2022-11-04 MED ORDER — LOPERAMIDE 2 MG CAPSULE
ORAL_CAPSULE | Freq: Two times a day (BID) | ORAL | 2 refills | 30.00000 days | Status: CP
Start: 2022-11-04 — End: 2023-02-02
  Filled 2022-11-04: qty 60, 8d supply, fill #0

## 2022-11-04 MED ORDER — LANCETS 28 GAUGE
0 refills | 0.00000 days | Status: CP
Start: 2022-11-04 — End: ?
  Filled 2022-11-07: qty 100, 90d supply, fill #0

## 2022-11-04 MED ORDER — BLOOD-GLUCOSE METER KIT WRAPPER
0 refills | 0.00000 days | Status: CP
Start: 2022-11-04 — End: 2023-11-04

## 2022-11-04 MED ORDER — GUAIFENESIN 100 MG/5 ML ORAL LIQUID
Freq: Four times a day (QID) | ORAL | 0 refills | 3.00000 days | Status: CP | PRN
Start: 2022-11-04 — End: 2022-11-04

## 2022-11-04 MED ORDER — BLOOD GLUCOSE TEST STRIPS
ORAL_STRIP | 0 refills | 0.00000 days | Status: CP
Start: 2022-11-04 — End: ?
  Filled 2022-11-07: qty 1, 30d supply, fill #0

## 2022-11-07 MED ORDER — BUMETANIDE 1 MG TABLET
ORAL_TABLET | Freq: Every day | ORAL | 0 refills | 30.00000 days | Status: CP
Start: 2022-11-07 — End: 2022-12-07
  Filled 2022-11-07: qty 30, 30d supply, fill #0

## 2022-11-07 MED FILL — FREESTYLE LITE STRIPS: 50 days supply | Qty: 50 | Fill #0

## 2022-11-11 ENCOUNTER — Ambulatory Visit: Admit: 2022-11-11 | Discharge: 2022-11-12 | Payer: MEDICARE

## 2022-11-11 DIAGNOSIS — K5080 Crohn's disease of both small and large intestine without complications: Principal | ICD-10-CM

## 2022-11-11 DIAGNOSIS — K56609 Unspecified intestinal obstruction, unspecified as to partial versus complete obstruction: Principal | ICD-10-CM

## 2022-11-11 DIAGNOSIS — R0602 Shortness of breath: Principal | ICD-10-CM

## 2022-11-11 MED ORDER — BUMETANIDE 1 MG TABLET
ORAL_TABLET | Freq: Two times a day (BID) | ORAL | 3 refills | 90 days | Status: CP
Start: 2022-11-11 — End: 2022-12-11

## 2022-11-12 ENCOUNTER — Telehealth: Payer: Self-pay

## 2022-11-12 NOTE — Telephone Encounter (Signed)
Patient called left message on voicemail please return his call to discuss his up coming appt here .  Called patient no answer, left message on his voicemail please return my call

## 2022-11-13 ENCOUNTER — Ambulatory Visit: Payer: Medicare Other

## 2022-11-13 DIAGNOSIS — K5080 Crohn's disease of both small and large intestine without complications: Principal | ICD-10-CM

## 2022-11-14 ENCOUNTER — Ambulatory Visit: Payer: Medicare Other

## 2022-11-14 ENCOUNTER — Ambulatory Visit: Payer: Medicare Other | Admitting: Dermatology

## 2022-11-14 MED ORDER — BUMETANIDE 1 MG TABLET
ORAL_TABLET | Freq: Two times a day (BID) | ORAL | 3 refills | 90 days | Status: CP
Start: 2022-11-14 — End: 2022-12-14

## 2022-11-15 ENCOUNTER — Telehealth: Admit: 2022-11-15 | Discharge: 2022-11-16 | Payer: MEDICARE

## 2022-11-15 DIAGNOSIS — K5080 Crohn's disease of both small and large intestine without complications: Principal | ICD-10-CM

## 2022-11-15 DIAGNOSIS — E8770 Fluid overload, unspecified: Principal | ICD-10-CM

## 2022-11-16 ENCOUNTER — Ambulatory Visit: Admit: 2022-11-16 | Discharge: 2022-11-15 | Payer: MEDICARE

## 2022-11-16 ENCOUNTER — Ambulatory Visit: Admit: 2022-11-16 | Discharge: 2022-11-22 | Payer: MEDICARE

## 2022-11-16 ENCOUNTER — Ambulatory Visit
Admit: 2022-11-16 | Discharge: 2022-11-22 | Disposition: A | Discharge status: Home Health | Payer: MEDICARE | Admitting: Internal Medicine

## 2022-11-23 MED ORDER — SPIRONOLACTONE 50 MG TABLET
ORAL_TABLET | Freq: Every day | ORAL | 1 refills | 30 days | Status: CP
Start: 2022-11-23 — End: 2023-01-22
  Filled 2022-11-22: qty 30, 30d supply, fill #0

## 2022-11-25 DIAGNOSIS — R0602 Shortness of breath: Principal | ICD-10-CM

## 2022-11-27 ENCOUNTER — Telehealth: Admit: 2022-11-27 | Discharge: 2022-11-28 | Payer: MEDICARE

## 2022-11-27 DIAGNOSIS — Z09 Encounter for follow-up examination after completed treatment for conditions other than malignant neoplasm: Principal | ICD-10-CM

## 2022-11-27 MED ORDER — SPIRONOLACTONE 50 MG TABLET
ORAL_TABLET | Freq: Every day | ORAL | 3 refills | 90 days | Status: CP
Start: 2022-11-27 — End: 2023-01-26

## 2022-11-27 MED ORDER — ONDANSETRON HCL 4 MG TABLET
ORAL_TABLET | Freq: Every day | ORAL | 1 refills | 60 days | Status: CP | PRN
Start: 2022-11-27 — End: 2023-11-27

## 2022-11-27 NOTE — Unmapped (Signed)
This visit is conducted via [name], applications.    Patient is currently located in the state of [place].  I have identified myself to the patient and conveyed my credentials to [name]  I have explained the capabilities and limitations of telemedicine and the patient and myself both agree that it is appropriate for their current circumstances/symptoms.  In case we get disconnected, patient's phone number is There are no phone numbers on file.   Patient has signed informed consent on file in medical record.  Is there someone else in the room? Yes. Wife is present. Daughter on phone call.     SUBJECTIVE:  Thomas Rosales is a 81 y.o. male who presents  for a video visit for hospital fu.     Since he got out of the hospital, some days he feels so weak that he lays in the chair most the day. He has stopped TPN therapy given his hypervolemia. He feels like nutrition status hasn't been great. He has been nauseous a few times and vomited once. Zofran has helped (wife's medication) Starting to have cramps. He is not drinking enough water.  Daughter is planning to help as caregiver. He is scheduled to see pulmonology tomorrow with PFTs and Dr. Milbert Coulter.     HISTORY:  I have reviewed the patients problem list, current medications, and allergies and have updated/reconciled them as needed    OBJECTIVE:  There were no vitals filed for this visit.    GEN: No acute distress.   SKIN: Color is normal. No rashes.  PSYCH: Appropriate affect, normal mood  RESP: Normal work of breathing, no retractions    Assessment/Plan:   Problem List Items Addressed This Visit    None  Visit Diagnoses       Encounter for examination following treatment at hospital    -  Primary          - Sent Zofran 4 mg prn and spironolactone 50 mg daily.   - FMLA paperwork for daughter filled out.     No orders of the defined types were placed in this encounter.    Requested Prescriptions     Signed Prescriptions Disp Refills    ondansetron (ZOFRAN) 4 MG tablet 60 tablet 1     Sig: Take 1 tablet (4 mg total) by mouth daily as needed for nausea.    spironolactone (ALDACTONE) 50 MG tablet 90 tablet 3     Sig: Take 1 tablet (50 mg total) by mouth daily for 60 doses.         Signs and symptoms that should prompt further evaluation were reviewed with the patient.      RETURN TO CARE:  No follow-ups on file.    The patient reports they are currently: at home. I spent 20 minutes on the real-time audio and video with the patient on the date of service. I spent an additional 0 minutes on pre- and post-visit activities on the date of service.      The patient was physically located in [place] or a state in which I am permitted to provide care. The patient and/or parent/guardian understood that s/he may incur co-pays and cost sharing, and agreed to the telemedicine visit. The visit was reasonable and appropriate under the circumstances given the patient's presentation at the time.     The patient and/or parent/guardian has been advised of the potential risks and limitations of this mode of treatment (including, but not limited to, the absence  of in-person examination) and has agreed to be treated using telemedicine. The patient's/patient's family's questions regarding telemedicine have been answered.      If the visit was completed in an ambulatory setting, the patient and/or parent/guardian has also been advised to contact their provider???s office for worsening conditions, and seek emergency medical treatment and/or call 911 if the patient deems either necessary.    I attest that I, Roxy Manns, personally documented this note while acting as scribe for Marlaine Hind, [place].      Roxy Manns, Scribe.  11/27/2022     The documentation recorded by the scribe accurately reflects the service I personally performed and the decisions made by me.    Marlaine Hind, PA

## 2022-11-28 ENCOUNTER — Ambulatory Visit: Admit: 2022-11-28 | Discharge: 2022-11-29 | Payer: MEDICARE

## 2022-11-28 DIAGNOSIS — K7460 Unspecified cirrhosis of liver: Principal | ICD-10-CM

## 2022-11-28 DIAGNOSIS — C220 Liver cell carcinoma: Principal | ICD-10-CM

## 2022-11-28 DIAGNOSIS — E440 Moderate protein-calorie malnutrition: Principal | ICD-10-CM

## 2022-11-28 DIAGNOSIS — G9340 Encephalopathy, unspecified: Principal | ICD-10-CM

## 2022-11-28 DIAGNOSIS — K7581 Nonalcoholic steatohepatitis (NASH): Principal | ICD-10-CM

## 2022-11-28 DIAGNOSIS — E8779 Other fluid overload: Principal | ICD-10-CM

## 2022-11-28 DIAGNOSIS — R188 Other ascites: Principal | ICD-10-CM

## 2022-11-28 MED ORDER — SPIRONOLACTONE 100 MG TABLET
ORAL_TABLET | Freq: Every day | ORAL | 5 refills | 30 days | Status: CP
Start: 2022-11-28 — End: 2023-05-27

## 2022-11-28 MED ORDER — RIFAXIMIN 550 MG TABLET
ORAL_TABLET | Freq: Two times a day (BID) | ORAL | 11 refills | 30 days | Status: CP
Start: 2022-11-28 — End: ?
  Filled 2022-12-06: qty 60, 30d supply, fill #0

## 2022-11-29 DIAGNOSIS — G9340 Encephalopathy, unspecified: Principal | ICD-10-CM

## 2022-12-05 NOTE — Unmapped (Signed)
[facility] Specialty Medication Onboarding    Specialty Medication: Xifaxan 550mg  tablet  Prior Authorization: Approved   Financial Assistance: No - copay  <$25  Final Copay/Day Supply: $0 / 30 days    Insurance Restrictions: Yes - max 1 month supply     Notes to Pharmacist: Max 126 per 274 day period. Per payment summary, insurance indicates that future retail fill may have higher cost    The triage team has completed the benefits investigation and has determined that the patient is able to fill this medication at [facility]. Please contact the patient to complete the onboarding or follow up with the prescribing physician as needed.

## 2022-12-06 NOTE — Unmapped (Signed)
[name]   Specialty Lite Counseling    Thomas Rosales is a 81 y.o. 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  I am speaking to  the patient's spouse, Thomas Rosales .    Was a Nurse, learning disability used for this call? No    Verified patient's date of birth / HIPAA.    Specialty Lite medication(s) to be sent: Infectious Disease: Xifaxan      Non-specialty medications/supplies to be sent: none      Medications not needed at this time: none         An offer to provide counseling to the patient regarding their medication was made. The patient accepted counseling. The patient was counseled on the following only: medication administration, goals of therapy, side effects and monitoring parameters, and warnings and precautions      Current Medications (including OTC/herbals), Comorbidities and Allergies     Current Outpatient Medications   Medication Sig Dispense Refill    acetaminophen (TYLENOL) 500 MG tablet Take 2 tablets (1,000 mg total) by mouth once as needed for pain.      Bacillus coagulans/inulin (PROBIOTIC WITH PREBIOTIC ORAL) Take 2 capsules by mouth daily.      blood sugar diagnostic (GLUCOSE BLOOD) Strp Check blood sugar as directed once a day and for symptoms of high or low blood sugar. 100 strip 0    blood-glucose meter kit Use as instructed 1 each 0    bumetanide (BUMEX) 1 MG tablet Take 1 tablet (1 mg total) by mouth daily.      cholecalciferol, vitamin D3-25 mcg, 1,000 unit,, 25 mcg (1,000 unit) capsule Take 1 capsule (25 mcg total) by mouth daily.      EPINEPHrine (EPIPEN) 0.3 mg/0.3 mL injection Inject 0.3 mL (0.3 mg total) under the skin once.      guaiFENesin (MUCINEX) 1,200 mg Ta12 Take 1 tablet by mouth every twelve (12) hours as needed.      guaiFENesin (ROBITUSSIN) 100 mg/5 mL syrup Take 10 mL (200 mg total) by mouth every six (6) hours as needed for congestion or cough for up to 7 days. 180 mL 0    lancets (FREESTYLE) 28 gauge Misc Check blood sugar as directed once a day and for symptoms of high or low blood sugar. 100 each 0    loperamide (IMODIUM) 2 mg capsule Take 1 capsule (2 mg total) by mouth two (2) times a day. You may increase up to 2 capsules four times a day to keep ostomy output around 1 L/day 60 capsule 2    loratadine (CLARITIN) 10 mg tablet Take 1 tablet (10 mg total) by mouth daily.      metoprolol succinate (TOPROL-XL) 50 MG 24 hr tablet TAKE 1 TABLET DAILY (Patient taking differently: Take 1 tablet (50 mg total) by mouth daily.) 90 tablet 3    multivitamin (TAB-A-VITE/THERAGRAN) per tablet Take 1 tablet by mouth daily.      ondansetron (ZOFRAN) 4 MG tablet Take 1 tablet (4 mg total) by mouth daily as needed for nausea. 60 tablet 1    pantoprazole (PROTONIX) 40 MG tablet TAKE 1 TABLET DAILY (Patient taking differently: Take 1 tablet (40 mg total) by mouth daily.) 90 tablet 2    rifAXIMin (XIFAXAN) 550 mg Tab Take 1 tablet (550 mg total) by mouth two (2) times a day. 60 tablet 11    spironolactone (ALDACTONE) 100 MG tablet Take 1 tablet (100 mg total) by mouth daily for 180  doses. 30 tablet 5    ustekinumab (STELARA) 45 mg/0.5 mL Soln 90mg  subcutaneous every 8weeks (Patient taking differently: Inject 90 mg under the skin. 90mg  subcutaneous every 8weeks) 1 mL 5    vit C/E/Zn/coppr/lutein/zeaxan (PRESERVISION AREDS 2 ORAL) Take 1 tablet by mouth two (2) times a day.      vitamin E acetate (VITAMIN E ORAL) Take 400 Units by mouth two (2) times a day.       No current facility-administered medications for this visit.       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)    Essential hypertension    Generalized abdominal pain    SIRS (systemic inflammatory response syndrome) (CMS-HCC)    Hyperglycemia    SBO (small bowel obstruction) (CMS-HCC)    NASH (nonalcoholic steatohepatitis)    Leukocytosis    Acute renal failure (ARF) (CMS-HCC) Crohn's disease (CMS-HCC)    GERD (gastroesophageal reflux disease)    Renal calculi    Asteroid hyalosis of right eye    Abdominal wall fluid collections    Encounter for subsequent annual wellness visit (AWV) in Medicare patient    Other osteoporosis without current pathological fracture    CKD (chronic kidney disease) stage 3, 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    Appropriateness of Therapy     Prescription has been clinically reviewed: Yes    Financial Information     Medication Assistance provided: Prior Authorization    Anticipated copay of $0 reviewed with patient.     Patient Specific Needs     Does the patient have any physical, cognitive, or cultural barriers? Yes - encephalopathy    Does the patient have adequate living arrangements? (i.e. the ability to store and take their medication appropriately) Yes    Did you identify any home environmental safety or security hazards? No    Patient prefers to have medications discussed with   Spouse - Thomas Rosales      Is the patient or caregiver able to read and understand education materials at a high school level or above? Yes    Patient's primary language is  English     Is the patient high risk? No    SOCIAL DETERMINANTS OF HEALTH     At the [facility], we have learned that life circumstances - like trouble affording food, housing, utilities, or transportation can affect the health of many of our patients.   That is why we wanted to ask: are you currently experiencing any life circumstances that are negatively impacting your health and/or quality of life? Patient declined to answer    Social Determinants of Health     Financial Resource Strain: Low Risk  (11/18/2022)    Overall Financial Resource Strain (CARDIA)     Difficulty of Paying Living Expenses: Not hard at all   Internet Connectivity: Not on file  Food Insecurity: No Food Insecurity (11/18/2022)    Hunger Vital Sign     Worried About Running Out of Food in the Last Year: Never true     Ran Out of Food in the Last Year: Never true   Tobacco Use: Medium Risk (11/28/2022)    Patient History     Smoking Tobacco Use: Former     Smokeless Tobacco Use: Never     Passive Exposure: Not on file   Housing/Utilities: Low Risk  (11/18/2022)    Housing/Utilities     Within the past 12 months, have you ever stayed: outside, in a car, in a tent, in an overnight shelter, or temporarily in someone else's home (i.e. couch-surfing)?: No     Are you worried about losing your housing?: No     Within the past 12 months, have you been unable to get utilities (heat, electricity) when it was really needed?: No   Alcohol Use: Not At Risk (04/19/2021)    Alcohol Use     How often do you have a drink containing alcohol?: Monthly or less     How many drinks containing alcohol do you have on a typical day when you are drinking?: 1 - 2     How often do you have 5 or more drinks on one occasion?: Never   Transportation Needs: No Transportation Needs (11/18/2022)    PRAPARE - Transportation     Lack of Transportation (Medical): No     Lack of Transportation (Non-Medical): No   Substance Use: Low Risk  (04/19/2021)    Substance Use     Taken prescription drugs for non-medical reasons: Never     Taken illegal drugs: Never     Patient indicated they have taken drugs in the past year for non-medical reasons: Yes, [positive answer(s)]: Not on file   Health Literacy: Low Risk  (04/21/2022)    Health Literacy     : Never   Physical Activity: Inactive (04/19/2021)    Exercise Vital Sign     Days of Exercise per Week: 0 days     Minutes of Exercise per Session: 0 min Interpersonal Safety: Not on file   Stress: No Stress Concern Present (04/19/2021)    [employer] of Occupational Health - Occupational Stress Questionnaire     Feeling of Stress : Not at all   Intimate Partner Violence: Not At Risk (04/19/2021)    Humiliation, Afraid, Rape, and Kick questionnaire     Fear of Current or Ex-Partner: No     Emotionally Abused: No     Physically Abused: No     Sexually Abused: No   Depression: Not at risk (04/19/2021)    PHQ-2     PHQ-2 Score: 0   Social Connections: Socially Integrated (04/19/2021)    Social Connection and Isolation Panel [NHANES]     Frequency of Communication with Friends and Family: More than three times a week     Frequency of Social Gatherings with Friends and Family: More than three times a week     Attends Religious Services: More than 4 times per year     Active Member of [employer] or Organizations: Yes     Attends Engineer, structural: More than 4 times per year     Marital Status: Married       Would you be willing to receive help with any of the needs that you have identified today? Not applicable    Delivery Information  Verified delivery address.    Scheduled delivery date: 12/06/22    Expected start date: 12/06/22    Medication will be delivered via Same Day Courier to the prescription address in [facility].  This shipment will not require a signature.      Explained the services we provide at [facility] and that each month we will send text messages and/or mychart messages to set up refills. Informed patient that refills should be scheduled 7-10 days prior to when they will run out of medication. Informed patient that a welcome packet, containing information about our pharmacy and other support services, a Notice of Privacy Practices, and a drug information handout will be sent.      The patient or caregiver noted above participated in the development of this care plan and knows that they can request review of or adjustments to the care plan at any time.      Patient or caregiver verbalized understanding of the above information as well as how to contact the pharmacy at [phone] option 4 with any questions/concerns.  The pharmacy is open Monday through Friday 8:30am-4:30pm.  A pharmacist is available 24/7 via pager to answer any clinical questions they may have.      Tobi Bastos, PharmD  [facility] Specialty Pharmacist

## 2022-12-09 ENCOUNTER — Ambulatory Visit: Payer: Medicare Other

## 2022-12-09 DIAGNOSIS — R188 Other ascites: Principal | ICD-10-CM

## 2022-12-09 DIAGNOSIS — K7460 Unspecified cirrhosis of liver: Principal | ICD-10-CM

## 2022-12-10 ENCOUNTER — Ambulatory Visit: Payer: Medicare Other

## 2022-12-11 ENCOUNTER — Ambulatory Visit: Payer: Medicare Other | Admitting: Dermatology

## 2022-12-11 ENCOUNTER — Ambulatory Visit: Payer: Medicare Other

## 2022-12-13 DIAGNOSIS — R188 Other ascites: Principal | ICD-10-CM

## 2022-12-13 DIAGNOSIS — C220 Liver cell carcinoma: Principal | ICD-10-CM

## 2022-12-13 DIAGNOSIS — R110 Nausea: Principal | ICD-10-CM

## 2022-12-13 DIAGNOSIS — K7581 Nonalcoholic steatohepatitis (NASH): Principal | ICD-10-CM

## 2022-12-13 DIAGNOSIS — K7460 Unspecified cirrhosis of liver: Principal | ICD-10-CM

## 2022-12-13 MED ORDER — ONDANSETRON HCL 4 MG TABLET
ORAL_TABLET | Freq: Two times a day (BID) | ORAL | 0 refills | 5 days | Status: CP | PRN
Start: 2022-12-13 — End: 2022-12-18

## 2022-12-13 NOTE — Unmapped (Signed)
Renewal of standing lab orders to [employer] [facility]. Will fax to home health team.   Received a message that the patient's wife called and wants to speak with Dr. Octaviano Glow about the care plan that was to be developed between him and Dr. Stevphen Rochester.   Called and spoke with the patient;s wife. She said that she wants to know the outcome of the meeting that was supposed to take place on Wednesday. She did not hear from anyone about the meeting.   Reviewed the HB Conference and did not find patient on the agenda.   Informed the spouse that I will ask dr. Octaviano Glow to call her. Family member verbalized understanding.

## 2022-12-13 NOTE — Unmapped (Signed)
Patient has appointment scheduled with Thomas Rosales (1/8) to discuss the nausea. Will be out of zofran tomorrow morning.

## 2022-12-13 NOTE — Unmapped (Signed)
Patient's wife called requesting a prescription for zofran 8 MG, previously prescribed 4 MG once daily on 11/27/22.  She reported giving patient medication outside of current instructions: taking 8MG  twice daily. Patient's wife stated pt has consistent nausea and indigestion. Denies other symptoms.

## 2022-12-14 NOTE — Unmapped (Signed)
Received a call from Thomas Rosales's spouse [name]) with multiple concerns.    1) She is worried about his overall health. Ongoing nausea on a daily basis that is worsening. He is still eating despite this. No vomiting. She is giving him Zofran.    2) She is wondering about the plan of care for his nutrition, TPN, etc. I told her I spoke to Dr. Stevphen Rochester after our visit and that I would let him know that she was hoping to speak with him.    3) His mental state/confusion is significantly better after starting rifaximin.    4) His swelling is almost entirely gone and his weight is down to low 140 lbs. His creatinine from last week was up (from 1.2 to 1.7) which suggests he may be over-diuresed. She is going to check what doses of diuretics he is on and get back to me, and I will likely cut them in half at least or consider stopping them.     5) Her daughter has arranged a palliative care/hospice visit and apparently the hospice providers are ready to start hospice on him if necessary. [name] is asking me if this is warranted. I honestly told her that given that I've only met him once, I am in a tough position to say for sure whether he needs hospice at this moment. I explained that his liver disease is incurable and certainly has worsened in the past few months (with encephalopathy, and volume overload), but these are manageable symptoms, and his MELD is not terribly high. Based just on this alone, hospice is not the obvious next step, but with his age, other comorbidities, and recent decline, he is clearly in bad shape. I think his other providers who have had more time caring for him may be able to weigh in on this with more insight. This include his PCP [name]) and Dr. Stevphen Rochester, both of whom I offered to reach out to.

## 2022-12-16 ENCOUNTER — Telehealth: Admit: 2022-12-16 | Discharge: 2022-12-17 | Payer: MEDICARE

## 2022-12-16 DIAGNOSIS — Z95828 Presence of other vascular implants and grafts: Principal | ICD-10-CM

## 2022-12-16 MED ORDER — DOXEPIN 10 MG/ML ORAL CONCENTRATE
12 refills | 0 days | Status: CP
Start: 2022-12-16 — End: ?

## 2022-12-16 MED ORDER — ONDANSETRON HCL 8 MG TABLET
ORAL_TABLET | Freq: Two times a day (BID) | ORAL | 3 refills | 90 days | Status: CP | PRN
Start: 2022-12-16 — End: 2023-12-16

## 2022-12-16 NOTE — Unmapped (Signed)
I spent 20 minutes on the phone with the patient's wife [name] today.  She seems a bit distraught about the patient's prognosis and what to do.  As I see it, there are 2 main issues.    1) liver disease.  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  This is complicated by hepatic encephalopathy, ascites, hepatocellular carcinoma.  He is currently followed by Dr. Octaviano Glow for these conditions.    2) nutrition and Crohn's disease.  [name] tells me that the patient's nausea has resolved since starting ondansetron.  He now has a normal appetite.  He is eating well.  He drinks between 2 and 4 cans of Ensure per day.  He does take ondansetron 8 mg twice daily to suppress chronic nausea.  I do not think his Crohn's disease is the cause of his GI symptoms.  No signs of bowel obstruction or inflammation on recent imaging.  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    Recommendations:  -Continue follow-up with Dr. Octaviano Glow regarding diuretic dose adjustment, encephalopathy treatment, discussion of overall prognosis from a liver disease perspective.   -Drink 4 cans of Ensure or boost per day.  Okay to consume other foods as desired, but I encouraged that they be low-sodium foods.  -Continue ustekinumab every 8 weeks as before  -Follow-up with me in clinic in 1 month as scheduled.  -[name] asks me about need for hospice care.  I do not think that his Crohn's disease be the cause of his mortality.  Therefore, I do not think we need to pursue hospice at this point from a Crohn's perspective.  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

## 2022-12-16 NOTE — Unmapped (Signed)
This visit is conducted via [name], applications.      Patient is currently located in the state of [place].  I have identified myself to the patient and conveyed my credentials to [name]  I have explained the capabilities and limitations of telemedicine and the patient and myself both agree that it is appropriate for their current circumstances/symptoms.  In case we get disconnected, patient's phone number is There are no phone numbers on file.   Patient has signed informed consent on file in medical record.  Is there someone else in the room? No.       Subjective:      Thomas Rosales is a 82 y.o. male who presents  for a video visit amidst COVID-19 outbreak.     Needs picc line supplies    Has episodes where he has difficulty initiating movement, initiating thought.  At least 3 distinct episodes.  They can be intense for hours, but improve over a day or two.  Has significantly improved with start rifamaxin.        Past Medical/Surgical History:     Past Medical History:   Diagnosis Date    Anal fissure 1983    Apnea, sleep     uses CPAP at night. instructed to bring DOS.     Basal cell carcinoma     Cancer (CMS-HCC)     skin cancer    CHF (congestive heart failure) (CMS-HCC)     Crohn's disease (CMS-HCC)     Diverticulitis of colon     Fatty liver     Gallstones     GERD (gastroesophageal reflux disease)     H/O echocardiogram     Hypertension     Irritable bowel syndrome     Kidney stone     NASH (nonalcoholic steatohepatitis)     Syncope     dizziness    Type 2 diabetes mellitus, without long-term current use of insulin (CMS-HCC) 10/24/2022     Past Surgical History:   Procedure Laterality Date    APPENDECTOMY      BOWEL RESECTION  1981, 1984, 1995    3 ft small intestine, <1 ft large intestines, cecum removed; 2nd bowel resection anal fistulas removed; 3rd bowel resection most of colon removed, colostomy placed.    CARDIAC CATHETERIZATION      CARDIOVASCULAR STRESS TEST      CHOLECYSTECTOMY      COLON SURGERY      Same as small bowel surgery    COLOSTOMY  1995    EYE SURGERY  1/20 & 4/21    Cataract & vitrious removal    FRACTURE SURGERY  1961    Broken tibia and fibula    HERNIA REPAIR  2019    Done by Dr Day    LAPAROSCOPIC SMALL BOWEL RESECTION      LEG SURGERY Left     pins in shin    LITHOTRIPSY      PR COLONOSCOPY FLX DX W/COLLJ SPEC WHEN PFRMD  03/19/2018    Procedure: COLONOSCOPY WITH POSSIBLE BIOPSY, POSSIBLE POLYPECTOMY.;  Surgeon: Tressie Ellis, MD;  Location: ENDO OR [facility];  Service: Gastroenterology    PR COLSC FLEXIBLE W/TRANSENDOSCOPIC BALLOON DILAT N/A 09/13/2022    Procedure: COLONOSCOPY, FLEXIBLE; WITH DILATION BY BALLOON, 1 OR MORE STRICTURES;  Surgeon: Luanne Bras, MD;  Location: [facility] PROCEDURES [facility];  Service: Gastroenterology    PR DRAIN SKIN ABSCESS SIMPLE N/A 08/11/2018    Procedure: INCISION  AND DRAINAGE OF ABDOMINAL WALL ABSCESS;  Surgeon: Jerry Caras Day, MD;  Location: OR [place];  Service: General Surgery    PR LAP, INCISIONAL HERNIA REPAIR,REDUCIBLE N/A 06/02/2018    Procedure: LAPAROSCOPIC ASSISTED PARASTOMAL HERNIA REPAIR AND INCISIONAL HERNIA REPAIR WITH MESH;  Surgeon: Jerry Caras Day, MD;  Location: OR [place];  Service: General Surgery    PR VITRECTOMY,MECHANICAL Right 03/28/2020    Procedure: trans pars planar vitrectomy, right eye   ;  Surgeon: Emi Holes, MD;  Location: [facility] OR [place];  Service: Ophthalmology    PR XCAPSL CTRC RMVL INSJ IO LENS PROSTH W/O ECP Right 01/13/2019    Procedure: PHACOEMULSIFICATION OF CATARACT WITH INSERTION OF INTRAOCULAR LENS/TORIC LENS/ORA;  Surgeon: Juanna Cao, MD;  Location: OR [facility];  Service: Ophthalmology    PR XCAPSL CTRC RMVL INSJ IO LENS PROSTH W/O ECP Left 01/27/2019    Procedure: PHACOEMULSIFICATION OF CATARACT WITH INSERTION OF INTRAOCULAR LENS/TORIC LENS/ORA;  Surgeon: Juanna Cao, MD;  Location: OR [facility];  Service: Ophthalmology    RESECTION SMALL BOWEL / CLOSURE ILEOSTOMY  1981    temporary ileostomy     REVISION COLOSTOMY  2009    SMALL INTESTINE SURGERY  3x since 1984    Crohn???s disease    URETERAL STENT PLACEMENT  2008       Family History:     Family History   Problem Relation Age of Onset    Heart disease Mother         pacemaker    Arthritis Mother     Dementia Father     Cancer Father        Social History:     Social History     Socioeconomic History    Marital status: Married    Number of children: 2    Highest education level: Master's degree (e.g., MA, MS, MEng, MEd, MSW, MBA)   Tobacco Use    Smoking status: Former     Current packs/day: 0.00     Average packs/day: 1 pack/day for 20.0 years (20.0 ttl pk-yrs)     Types: Cigarettes     Start date: 12/09/1964     Quit date: 12/09/1984     Years since quitting: 38.0    Smokeless tobacco: Never   Vaping Use    Vaping Use: Never used   Substance and Sexual Activity    Alcohol use: Yes     Comment: occassionally    Drug use: No    Sexual activity: Yes     Partners: Female     Birth control/protection: None     Social Determinants of Health     Financial Resource Strain: Low Risk  (11/18/2022)    Overall Financial Resource Strain (CARDIA)     Difficulty of Paying Living Expenses: Not hard at all   Food Insecurity: No Food Insecurity (11/18/2022)    Hunger Vital Sign     Worried About Running Out of Food in the Last Year: Never true     Ran Out of Food in the Last Year: Never true   Transportation Needs: No Transportation Needs (11/18/2022)    PRAPARE - Therapist, art (Medical): No     Lack of Transportation (Non-Medical): No   Physical Activity: Inactive (04/19/2021)    Exercise Vital Sign     Days of Exercise per Week: 0 days     Minutes of Exercise per Session: 0 min   Stress: No Stress Concern Present (04/19/2021)  [employer] of Occupational Health - Occupational Stress Questionnaire     Feeling of Stress : Not at all   Social Connections: Socially Integrated (04/19/2021)    Social Connection and Isolation Panel [NHANES]     Frequency of Communication with Friends and Family: More than three times a week     Frequency of Social Gatherings with Friends and Family: More than three times a week     Attends Religious Services: More than 4 times per year     Active Member of [employer] or Organizations: Yes     Attends Engineer, structural: More than 4 times per year     Marital Status: Married       Allergies:     Patient has no known allergies.    Current Medications:     Current Outpatient Medications   Medication Sig Dispense Refill    acetaminophen (TYLENOL) 500 MG tablet Take 2 tablets (1,000 mg total) by mouth once as needed for pain.      Bacillus coagulans/inulin (PROBIOTIC WITH PREBIOTIC ORAL) Take 2 capsules by mouth daily.      blood sugar diagnostic (GLUCOSE BLOOD) Strp Check blood sugar as directed once a day and for symptoms of high or low blood sugar. 100 strip 0    blood-glucose meter kit Use as instructed 1 each 0    bumetanide (BUMEX) 1 MG tablet Take 1 tablet (1 mg total) by mouth daily.      cholecalciferol, vitamin D3-25 mcg, 1,000 unit,, 25 mcg (1,000 unit) capsule Take 1 capsule (25 mcg total) by mouth daily.      EPINEPHrine (EPIPEN) 0.3 mg/0.3 mL injection Inject 0.3 mL (0.3 mg total) under the skin once.      guaiFENesin (MUCINEX) 1,200 mg Ta12 Take 1 tablet by mouth every twelve (12) hours as needed.      guaiFENesin (ROBITUSSIN) 100 mg/5 mL syrup Take 10 mL (200 mg total) by mouth every six (6) hours as needed for congestion or cough for up to 7 days. 180 mL 0    lancets (FREESTYLE) 28 gauge Misc Check blood sugar as directed once a day and for symptoms of high or low blood sugar. 100 each 0    loperamide (IMODIUM) 2 mg capsule Take 1 capsule (2 mg total) by mouth two (2) times a day. You may increase up to 2 capsules four times a day to keep ostomy output around 1 L/day 60 capsule 2    loratadine (CLARITIN) 10 mg tablet Take 1 tablet (10 mg total) by mouth daily.      metoprolol succinate (TOPROL-XL) 50 MG 24 hr tablet TAKE 1 TABLET DAILY (Patient taking differently: Take 1 tablet (50 mg total) by mouth daily.) 90 tablet 3    multivitamin (TAB-A-VITE/THERAGRAN) per tablet Take 1 tablet by mouth daily.      ondansetron (ZOFRAN) 4 MG tablet Take 2 tablets (8 mg total) by mouth every twelve (12) hours as needed for nausea for up to 5 days. 20 tablet 0    ondansetron (ZOFRAN) 8 MG tablet Take 1 tablet (8 mg total) by mouth every twelve (12) hours as needed for nausea. 180 tablet 3    pantoprazole (PROTONIX) 40 MG tablet TAKE 1 TABLET DAILY (Patient taking differently: Take 1 tablet (40 mg total) by mouth daily.) 90 tablet 2    rifAXIMin (XIFAXAN) 550 mg Tab Take 1 tablet (550 mg total) by mouth two (2) times a day. 60 tablet 11  spironolactone (ALDACTONE) 100 MG tablet Take 1 tablet (100 mg total) by mouth daily for 180 doses. 30 tablet 5    ustekinumab (STELARA) 45 mg/0.5 mL Soln 90mg  subcutaneous every 8weeks (Patient taking differently: Inject 90 mg under the skin. 90mg  subcutaneous every 8weeks) 1 mL 5    vit C/E/Zn/coppr/lutein/zeaxan (PRESERVISION AREDS 2 ORAL) Take 1 tablet by mouth two (2) times a day.      vitamin E acetate (VITAMIN E ORAL) Take 400 Units by mouth two (2) times a day.       No current facility-administered medications for this visit.       ROS   ROS negative unless otherwise noted in HPI.    Objective:   There were no vitals filed for this visit.    GEN: No acute distress.   SKIN: Color is normal. No rashes.  PSYCH: Appropriate affect, normal mood  RESP: Normal work of breathing, no retractions      Assessment and Plan:     Problem List Items Addressed This Visit    None  Visit Diagnoses       Status post PICC central line placement    -  Primary    Relevant Orders    Home Medical Equipment          1) picc line supplies ordered    2) nausea  -    Orders Placed This Encounter   Procedures    Home Medical Equipment     Requested Prescriptions      No prescriptions requested or ordered in this encounter           RETURN TO CARE:  No follow-ups on file.    Visit conducted via Video    Total time spent with patient: 20 minutes

## 2022-12-17 ENCOUNTER — Ambulatory Visit
Admit: 2022-12-17 | Discharge: 2022-12-17 | Disposition: A | Discharge status: Home / Self Care | Payer: MEDICARE | Attending: Emergency Medicine

## 2022-12-17 ENCOUNTER — Emergency Department
Admit: 2022-12-17 | Discharge: 2022-12-17 | Disposition: A | Discharge status: Home / Self Care | Payer: MEDICARE | Attending: Emergency Medicine

## 2022-12-17 DIAGNOSIS — K56600 Partial intestinal obstruction, unspecified as to cause: Principal | ICD-10-CM

## 2022-12-17 LAB — CBC W/ AUTO DIFF
BASOPHILS ABSOLUTE COUNT: 0.3 10*9/L — ABNORMAL HIGH (ref 0.0–0.1)
BASOPHILS RELATIVE PERCENT: 2.7 %
EOSINOPHILS ABSOLUTE COUNT: 0 10*9/L (ref 0.0–0.5)
EOSINOPHILS RELATIVE PERCENT: 0.2 %
HEMATOCRIT: 34.3 % — ABNORMAL LOW (ref 39.0–48.0)
HEMOGLOBIN: 11.7 g/dL — ABNORMAL LOW (ref 12.9–16.5)
LYMPHOCYTES ABSOLUTE COUNT: 1 10*9/L — ABNORMAL LOW (ref 1.1–3.6)
LYMPHOCYTES RELATIVE PERCENT: 10 %
MEAN CORPUSCULAR HEMOGLOBIN CONC: 34 g/dL (ref 32.0–36.0)
MEAN CORPUSCULAR HEMOGLOBIN: 33.1 pg — ABNORMAL HIGH (ref 25.9–32.4)
MEAN CORPUSCULAR VOLUME: 97.3 fL — ABNORMAL HIGH (ref 77.6–95.7)
MEAN PLATELET VOLUME: 9 fL (ref 6.8–10.7)
MONOCYTES ABSOLUTE COUNT: 1 10*9/L — ABNORMAL HIGH (ref 0.3–0.8)
MONOCYTES RELATIVE PERCENT: 10.2 %
NEUTROPHILS ABSOLUTE COUNT: 7.7 10*9/L (ref 1.8–7.8)
NEUTROPHILS RELATIVE PERCENT: 76.9 %
NUCLEATED RED BLOOD CELLS: 0 /100{WBCs} (ref <=4)
PLATELET COUNT: 194 10*9/L (ref 150–450)
RED BLOOD CELL COUNT: 3.53 10*12/L — ABNORMAL LOW (ref 4.26–5.60)
RED CELL DISTRIBUTION WIDTH: 15.6 % — ABNORMAL HIGH (ref 12.2–15.2)
WBC ADJUSTED: 10 10*9/L (ref 3.6–11.2)

## 2022-12-17 LAB — URINALYSIS WITH MICROSCOPY WITH CULTURE REFLEX
BACTERIA: NONE SEEN /HPF
BILIRUBIN UA: NEGATIVE
BLOOD UA: NEGATIVE
GLUCOSE UA: NEGATIVE
KETONES UA: NEGATIVE
NITRITE UA: NEGATIVE
PH UA: 5.5 (ref 5.0–9.0)
PROTEIN UA: NEGATIVE
RBC UA: 9 /HPF — ABNORMAL HIGH (ref <=3)
SPECIFIC GRAVITY UA: 1.018 (ref 1.003–1.030)
SQUAMOUS EPITHELIAL: <1 /HPF (ref 0–5)
UROBILINOGEN UA: <2
WBC UA: 3 /HPF — ABNORMAL HIGH (ref <=2)

## 2022-12-17 LAB — SEDIMENTATION RATE: ERYTHROCYTE SEDIMENTATION RATE: 58 mm/h — ABNORMAL HIGH (ref 0–20)

## 2022-12-17 LAB — COMPREHENSIVE METABOLIC PANEL
ALBUMIN: 2.3 g/dL — ABNORMAL LOW (ref 3.4–5.0)
ALKALINE PHOSPHATASE: 240 U/L — ABNORMAL HIGH (ref 46–116)
ALT (SGPT): 43 U/L (ref 10–49)
ANION GAP: 6 mmol/L (ref 5–14)
AST (SGOT): 54 U/L — ABNORMAL HIGH (ref <=34)
BILIRUBIN TOTAL: 1 mg/dL (ref 0.3–1.2)
BLOOD UREA NITROGEN: 28 mg/dL — ABNORMAL HIGH (ref 9–23)
BUN / CREAT RATIO: 22
CALCIUM: 8.7 mg/dL (ref 8.7–10.4)
CHLORIDE: 108 mmol/L — ABNORMAL HIGH (ref 98–107)
CO2: 21.2 mmol/L (ref 20.0–31.0)
CREATININE: 1.3 mg/dL — ABNORMAL HIGH
EGFR CKD-EPI (2021) MALE: 56 mL/min/{1.73_m2} — ABNORMAL LOW (ref >=60)
GLUCOSE RANDOM: 157 mg/dL (ref 70–179)
POTASSIUM: 5.1 mmol/L — ABNORMAL HIGH (ref 3.4–4.8)
PROTEIN TOTAL: 7 g/dL (ref 5.7–8.2)
SODIUM: 135 mmol/L (ref 135–145)

## 2022-12-17 LAB — MAGNESIUM: MAGNESIUM: 1.6 mg/dL (ref 1.6–2.6)

## 2022-12-17 LAB — LIPASE: LIPASE: 66 U/L — ABNORMAL HIGH (ref 12–53)

## 2022-12-17 LAB — C-REACTIVE PROTEIN: C-REACTIVE PROTEIN: 30 mg/L — ABNORMAL HIGH (ref <=10.0)

## 2022-12-17 MED ADMIN — iohexol (OMNIPAQUE) 350 mg iodine/mL solution 100 mL: 100 mL | INTRAVENOUS | @ 11:00:00 | Stop: 2022-12-17

## 2022-12-17 MED ADMIN — morphine injection 2 mg: 2 mg | INTRAVENOUS | @ 10:00:00 | Stop: 2022-12-17

## 2022-12-17 NOTE — Unmapped (Signed)
Pt with history of bowel obstructions with colostomy in place presents with grabbing abdominal pain that began about 1900.Pt reports nausea without vomiting. Some output noted in colostomy bag

## 2022-12-17 NOTE — Unmapped (Signed)
Emergency Department Provider Note      ED Assessment/Plan     Thomas Rosales is a pleasant 82 y.o. male w/ MASLD cirrhosis c/b HCC, Crohn's disease, SBO s/p colostomy, perforated bowl, CKD, and HTN presenting with 9 hours of abdominal pain near the site of colostomy bag which feels similar to prior bowel obstructions. Now improved.    Initial impression and pertinent physical exam:      BP 125/56  - Pulse 60  - Temp 36.4 ??C (97.5 ??F) (Oral)  - Resp 16  - SpO2 99%     On initial evaluation vitals are afebrile and hemodynamically stable. Patient is well-appearing. Remarkable for mild diffuse abdominal tenderness no rebound or guarding.     Diagnostic workup as below.     No orders of the defined types were placed in this encounter.      Will reassess as we get results and update below    ED Course as of 12/17/22 0901   Tue Dec 17, 2022   1610 Differential diagnosis includes SBO, SBP, pancreatitis, biliary pathology, gastroenteritis, nephrolithiasis, among others.   0429 Lipase(!): 66  Stable   0646 CT Abdomen Pelvis W IV Contrast  IMPRESSION:  1. Multiple loops of dilated small bowel up to 4.4 cm compatible with a partial small bowel obstruction. No discrete transition point is identified, although it is suspected to be within the midline lower anterior abdominal wall due to small bowel adhesions.     2. The rectal stump is tethered to the anterior abdominal wall.     3. Interval slight worsening of skin thickening and edema of the inferior anterior abdominal wall, additionally a few new locules of gas are present, suggesting worsening of enterocutaneous fistula.     4. Parastomal hernia containing small bowel.     5. Additional findings as described above.           MDM: Patient has partial small bowel obstruction. He has minimal abdominal pain, no nausea, and is passing gas. I discussed admission for observation versus discharge with strict return precautions. Both he and his wife agree to monitor closely for symptoms such as worsening pain, vomiting, fever, or absence of flatulence. Patient is agreeable to the plan and was discharged.        Discussion of Management with other Physicians, QHP, or Appropriate Source: Discussion with other professionals: None  External Records Reviewed: 11/28/22 [facility] Hep Office Visit. Pmh includes MASLD cirrhosis c/b HCC, Crohn's disease, SBO, perforated bowl, CKD, and HTN.  Escalation of Care, Consideration of Admission/Observation/Transfer:     ____________________________________________    The case was discussed with the attending physician, who is in agreement with the above assessment and plan.     History     Chief Complaint   Patient presents with    Abdominal Pain       HPI: Thomas Rosales is a pleasant 82 y.o. male w/ MASLD cirrhosis c/b HCC, Crohn's disease, SBO, perforated bowl, CKD, and HTN presenting with 9 hours of abdominal pain near the site of colostomy bag which feels similar to prior bowel obstructions. The patient finished eating dinner at the onset of his symptoms. He describes his abdominal pain as waxing and waning. He is able to pass gas and have small bowel movementsl. While in the ED, patient states his abdominal pain has improved. No recent medication changes. Denies abdominal distension, bloating, nausea, emesis, or fever.     PMH/PSH: As noted in HPI.  Allergies: No Known Allergies    Social:   Social History     Tobacco Use    Smoking status: Former     Current packs/day: 0.00     Average packs/day: 1 pack/day for 20.0 years (20.0 ttl pk-yrs)     Types: Cigarettes     Start date: 12/09/1964     Quit date: 12/09/1984     Years since quitting: 38.0    Smokeless tobacco: Never   Vaping Use    Vaping Use: Never used   Substance Use Topics    Alcohol use: Yes     Comment: occassionally    Drug use: No       Physical Exam      General: Awake, alert, in no distress  HEENT: Head atraumatic. Moist mucous membranes. No scleral icterus or conjunctivitis.    Cardiac: RRR.   Pulmonary: Breathing comfortably w/ no accessory muscle usage.    Abdominal: Mild diffuse abdominal tenderness no rebound or guarding.   GU: Deferred   MSK: No lower extremity edema.    Dermatologic: No jaundice or pallor.    Neurologic: Alert. Follows commands. Moving all extremities spontaneously.    Psychiatric: Normal mood and behavior.       Documentation assistance was provided by Lucas Mallow, Scribe, on December 17, 2022 at 4:28 AM for Binnie Rail, MD     Documentation assistance was provided by the scribe in my presence.  The documentation recorded by the scribe has been reviewed by me and accurately reflects the services I personally performed.    Elizabeth Palau, PGY-2  [facility] Emergency Medicine  Pager: [phone]          Binnie Rail, MD  Resident  12/17/22 [phone]

## 2022-12-20 NOTE — Unmapped (Signed)
[facility] ED Post-Discharge Call    Admission in the last 30 days    Contacted patient's spouse at [phone] to discuss ED visit. Confirmed patient identity.     We want to ensure you understood your plan of care. Review AVS instructions. Did your discharge instructions answer all of your questions? yes  Have you made a follow-up appointment? yes  Have you filled your prescriptions (if applicable)? N/A  Do you have any questions regarding your prescriptions?N/A  Do you have any other questions? No  Return precautions discussed.

## 2022-12-23 DIAGNOSIS — C220 Liver cell carcinoma: Principal | ICD-10-CM

## 2022-12-23 DIAGNOSIS — R188 Other ascites: Principal | ICD-10-CM

## 2022-12-23 DIAGNOSIS — K7581 Nonalcoholic steatohepatitis (NASH): Principal | ICD-10-CM

## 2022-12-23 DIAGNOSIS — K7460 Unspecified cirrhosis of liver: Principal | ICD-10-CM

## 2022-12-24 NOTE — Unmapped (Signed)
Patient's wife called in regarding the following medication    doxepin (SINEQUAN) 10 mg/mL solution [[id number]]     She states that this prescription  is having no effect. They have the following questions    1 - Can they up the dosage?    2 - Should they switch to a different medication?       Connie's best call back number is [phone]

## 2022-12-25 DIAGNOSIS — K50013 Crohn's disease of small intestine with fistula: Principal | ICD-10-CM

## 2022-12-25 DIAGNOSIS — G9340 Encephalopathy, unspecified: Principal | ICD-10-CM

## 2022-12-25 MED ORDER — LOPERAMIDE 2 MG CAPSULE
ORAL_CAPSULE | Freq: Two times a day (BID) | ORAL | 2 refills | 30 days | Status: CP
Start: 2022-12-25 — End: 2023-03-25

## 2022-12-25 MED ORDER — RIFAXIMIN 550 MG TABLET
ORAL_TABLET | Freq: Two times a day (BID) | ORAL | 3 refills | 90 days | Status: CP
Start: 2022-12-25 — End: 2023-12-20

## 2022-12-26 NOTE — Unmapped (Signed)
Checking up on PICC line supplies patient is down to critical levels.  Nurse was in process of calling while on the phone with triage nurse.

## 2022-12-26 NOTE — Unmapped (Signed)
Patient's wife called and asked to have Rx for Xifaxan and imodium transferred to [facility].   She also asked if the lab results that the home health nurse is collecting is being sent here. Informed her that I have not seen any results but will look for them.

## 2022-12-27 MED ORDER — SYRINGE (DISPOSABLE) 1 ML
0 refills | 0 days | Status: CP
Start: 2022-12-27 — End: ?

## 2022-12-28 NOTE — Unmapped (Signed)
REASON FOR VISIT:  Crohn's disease    HISTORY OF PRESENT ILLNESS:  Since last visit,  09/13/2022, colonoscopy to TI x 8 cm.  8 mm diameter by 8 mm long noninflamed stenosis at ICA.  Dilated to 12 mm.  No mucosal disruption.  No active inflammation in the ileum or colon.  10/22/2022, enterocutaneous fistula drainage started.  11/18/2022, TTE.  EF 55%.  Grade 2 diastolic dysfunction.  Moderately dilated left atrium.  Elevated right atrial pressures.  10/23/2022 - 11/07/2022, hospitalized to start TPN.  Complicated by volume overload treated with diuretics.  11/13/2022, CTE.  Resolution of large abdominal wall fluid collection.  Small bowel adhesions to anterior abdominal wall with gas bubbles.  Parastomal hernia containing small bowel.  Wall enhancement and stranding of colon and duodenum.  Enlarging liver lesions.  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  Several 4.5 cm small bowel loops without clear transition point.  Wall thickening and enhancement in portion of small bowel.  Ascites, body wall edema, scrotal hydrocele suggesting fluid overload.  11/15/2022 - 11/22/2022, hospitalized for edema due to volume overload.  Treated with diuretics.  TPN stopped.  Diagnosed with new HCC and liver cirrhosis.  11/17/2022, MRI abdomen.  1.5 cm liver lesion in segment 5.  LR-5 definite hepatocellular carcinoma.  1.1 cm segment 7 LR-3 indeterminate lesion.  9 mm pancreatic tail cyst.  Diffuse wall edema of stomach and colon.  11/21/2022, HBsAb, HBctotal, HBsAg all neg. HCV Ab pos. HCV RNA not detected.  12/11/2022, CMP normal except BUN 28, creatinine 1.57, potassium 5.7, calcium 8, albumin 2.9, alk phos 256, AST 64, ALT 47  12/17/2022, ER visit for abdominal pain near colostomy.  Spontaneously improved.  12/17/2022, CTAP.  4.4 cm dilated small bowel without transition point.  Small bowel adhesions.  Rectal stump tethered to anterior abdominal wall.  Increased size of fluid collection in anterior abdominal wall with new locules of gas. Parastomal hernia containing small bowel.  1.4 cm liver nodule in segment 5.  Pancreatic tail cyst.  Nonobstructive kidney stones (1.5-1.8 cm)  12/17/2022, CBC, CMP normal except hemoglobin 11.7, MCV 97, potassium 5.1, creatinine 1.3, BUN 28, albumin 2.3, AST 54, alk phos 240.  Lipase 66, CRP 30 mg/L, ESR 58    Still with significant fatigue, but no confusion.  Almost his old self. Feels like the rifaxamin has helped physical mobility.  Main complaint is weakness and fatigue. Somewhat better since starting rifaximin. Has gained 5 lbs in last week. Ran out of bumex. Still taking aldactone. Still taking stelara q 8 wks. Takes loperamide 2mg  twice daily with good results. Stools more formed. Empties bag 4-6x mushy bm per day. Rare red blood--thinks it's coming from the stoma. Increased gas in ostomy bag-slightly better after stopping rifaximin. EC fistula seems to be draining more as of yesterday--more red than usual--not wearing anything over the fistula opening. No n/v/ap since 1/9 ER visit.    MEDICATIONS:  has a current medication list which includes the following prescription(s): acetaminophen, bacillus coagulans/inulin, glucose blood, blood-glucose meter, cholecalciferol (vitamin d3-25 mcg (1,000 unit)), doxepin, epinephrine, guaifenesin, guaifenesin, lancets, loperamide, loratadine, metoprolol succinate, multivitamin, ondansetron, pantoprazole, rifaximin, syringe (disposable), stelara, vit c/e/zn/coppr/lutein/zeaxan, and vitamin e acetate.    ALLERGIES:  Allergies as of 12/30/2022    (No Known Allergies)       PAST MEDICAL HISTORY:  1. ileocolonic Crohn's disease with perianal fistula.  Symptoms started in 1960s.  Diagnosed 1973.  Sulfasalazine and decades of prednisone.  1983.  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  Ileostomy reversal.  1995, left hemicolectomy with end colostomy and Hartman's pouch.  Tapered off prednisone in mid 90s.  Started infliximab followed by certolizumab both which had primary nonresponse.  Started adalimumab which worked well for 10 years.  Steroid free remission.  2009, recurrent SBO treated conservatively and with prednisone.  Required several small bowel resections during this time.  2018, new onset heart failure (EF 30%) thought to be due to anti-TNF.  Stopped adalimumab with improvement in ejection fraction.  Asymptomatic off therapy x3 years.  2019, parastomal hernia repair and ventral incisional hernia repair with revision of stoma and biodegradable mesh placement.  Postop abscess that resolved.  05/2020, SBO probably due to adhesions treated with lysis of adhesions.  Poor wound healing postoperatively.  06/2020, start ustekinumab prophylactically to help maintain remission even though no active Crohn's at time of surgery. 12/28/2020, SBO-probably adhesions or hernia, treated with supportive care. 12/03/2021, admitted for SBO.  Treated with supportive care. 05/04/22, hospitalized for SBO.  Treated with supportive care. 09/13/2022, colonoscopy with fibrotic anastomotic stenosis that was balloon dilated. 10/22/2022, enterocutaneous fistula drainage started. 10/23/2022 - 11/07/2022, hospitalized to start TPN.  Complicated by volume overload treated with diuretics. 11/15/2022 - 11/22/2022, hospitalized for edema due to volume overload.  Treated with diuretics.  TPN stopped.  Diagnosed with new HCC and liver cirrhosis.  2.  Sleep apnea  3.  Congestive heart failure, associated with anti-TNF (adalimumab). 11/18/2022, TTE.  EF 55%.  Grade 2 diastolic dysfunction.  Moderately dilated left atrium.  Elevated right atrial pressures consistent with fluid overload (?TPN).  4.  Heartburn  5.  Hypertension  6.  MASLD cirrhosis with HCC diagnosed 11/2022  7.  Kidney stones  8.  Skin cancer  9.  06/2018, PPD negative  10.  03/2017, PPSV23  11.  COVID-19 mRNA vaccines x2   12. Osteoporosis  13. Chronically elevated alk phos    SOCIAL HISTORY:  Quit smoking.  Married. Retired [name], [employer].    FAMILY HISTORY:  No IBD.  Father had colon cancer in his 76s.    PHYSICAL EXAM:  BP 116/52 (BP Site: L Arm, BP Position: Sitting, BP Cuff Size: Medium)  - Pulse 55  - Temp 36.6 ??C (97.9 ??F) (Temporal)  - Ht 168 cm (5' 6.14)  - Wt 72.6 kg (160 lb)  - BMI 25.71 kg/m??   Wt Readings from Last 10 Encounters:   12/30/22 72.6 kg (160 lb)   11/28/22 71.9 kg (158 lb 9.6 oz)   11/22/22 79.7 kg (175 lb 11.2 oz)   11/11/22 84.4 kg (186 lb)   11/06/22 84 kg (185 lb 3.2 oz)   10/23/22 72.8 kg (160 lb 8 oz)   10/16/22 74.8 kg (164 lb 12.8 oz)   09/13/22 74.8 kg (165 lb)   07/17/22 76.3 kg (168 lb 3.2 oz)   06/20/22 75.5 kg (166 lb 6.4 oz)     CONSTITUTIONAL: awake, alert, NAD  NEUROLOGIC: oriented x 3, no asterixis  SKIN: warm, dry, without rash  Extremities: 2+ pitting edema in ankles bilaterally  Abdomen: Left mid abdominal ostomy with soft orange stool.  Midline surgical incision with  2 small crusty areas but no active drainage.  No tenderness, fluctuance, erythema.    TEST DATA:  1.  03/19/2018, colonoscopy.  Patent end sigmoid colostomy characterized by erythema and friable mucosa that was biopsied.  Patent end-to-end ileocolonic anastomosis characterized by edema, erosions, erythema, friability.  Normal ileum.  No obvious evidence for active Crohn's disease.    2.  09/22/2020, CT abdomen pelvis.  Postsurgical changes of abdominal wall.  No fluid collection.  No fistula.  No signs of active IBD.  Hepatic steatosis.  Spine compression deformities.  Aortic atherosclerosis.  3.  10/31/2020, CT enterography.  Left lower quadrant end colostomy with parastomal hernia.  Status post right hemicolectomy with ileocolonic anastomosis.  1.1 cm descending colon lipoma.  No active bowel inflammation.  4. 12/27/2020, CTAP without contrast.  Status post right hemicolectomy and loop colostomy.  Parastomal hernia containing fat and multiple dilated small bowel loops.  No discrete transition point.  Bowel loops adhered to the anterior abdominal wall where surrounding mesenteric stranding is present.  5. 02/28/2021, QDR. L1-4 T=-0.8, Left femoral neck T= -2.8, Right femoral neck T=-3.1  6. 12/03/2021, CTAP with IV contrast only.  Partial SBO with transition point in left upper quadrant.  Likely due to adhesion.  7. 05/04/22, CTAP w IV contrast. Steatosis. 8mm CBD. 3.8cm SB loops with transition point proximal to ileocolonic anastomosis-suspect adhesions. 9.7cm fluid and gas collection in ventral abdomen between SB loops. ? Fistula.  8. 05/07/2022, CTAP w IV/PO contrast. 1cm pancreatic cyst (stable), small peristomal hernia. Resolution of sbo. Colon diverticula. ? 14.6cm fistula-like collection near anterior abdominal wall.  9. 09/13/2022, colonoscopy to TI x 8 cm.  8 mm diameter by 8 mm long noninflamed stenosis at ICA.  Dilated to 12 mm.  No mucosal disruption.  No active inflammation in the ileum or colon.  10. 11/13/2022, CTE.  Resolution of large abdominal wall fluid collection.  Small bowel adhesions to anterior abdominal wall with gas bubbles.  Parastomal hernia containing small bowel.  Wall enhancement and stranding of colon and duodenum.  Enlarging liver lesions.  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  Several 4.5 cm small bowel loops without clear transition point.  Wall thickening and enhancement in portion of small bowel.  Ascites, body wall edema, scrotal hydrocele suggesting fluid overload.  11. 11/17/2022, MRI abdomen.  1.5 cm liver lesion in segment 5.  LR-5 definite hepatocellular carcinoma.  1.1 cm segment 7 LR-3 indeterminate lesion.  9 mm pancreatic tail cyst.  Diffuse wall edema of stomach and colon.  12. 11/21/2022, HBsAb, HBctotal, HBsAg all neg. HCV Ab pos. HCV RNA not detected.  13. 12/17/2022, CTAP.  4.4 cm dilated small bowel without transition point.  Small bowel adhesions.  Rectal stump tethered to anterior abdominal wall.  Increased size of fluid collection in anterior abdominal wall with new locules of gas. Parastomal hernia containing small bowel.  1.4 cm liver nodule in segment 5.  Pancreatic tail cyst.  Nonobstructive kidney stones (1.5-1.8 cm)    ASSESSMENT:  1.  Ileocolonic Crohn's disease with history of perianal fistula.  Status post right hemicolectomy with ileocolonic anastomosis and left-sided end colostomy.  Recurrent hospitalizations for small bowel obstruction.  Probably due to adhesions or fibrotic stenosis at the anastomosis.  However, cannot completely rule out proximal small bowel Crohn's disease though I think this is less likely.  No active inflammation within reach of the scope.   Not a surgical candidate at this time due to [facility], cirrhosis. Continue ustekinumab every 8 weeks as before.  Reviewed risks including risks of infection.  Could consider repeat colonoscopy with balloon dilation of the anastomotic stricture as needed.  2.  Reported diagnosis of congestive heart failure.  Last echocardiogram shows only mild diastolic dysfunction.  Preserved LVEF of greater than 55%.  3.  Fatigue.  Unclear etiology.  Continue working with primary care doctor on this.  4.  MASLD cirrhosis with HCC.  No signs of encephalopathy or obvious ascites today.  Does have peripheral edema which is chronic problem for him.  Continue follow-up with Dr. Octaviano Glow  5.  Recent hyperkalemia.  Could have been due to spironolactone use which has since been stopped.  Repeat BMP today.  6.  Increased gas in ostomy appliance.  Likely due to dietary sources.    RECOMMENDATIONS AND PLAN:  Patient Instructions   -Continue Stelara every 8 weeks  -Try and reduce consumption of foods that cause gas (see below)  -Follow up with Dr. Octaviano Glow re diuretic doseage and liver cancer treatment  -Use Imodium as needed to reduce ostomy output  -Bloodwork today to check electrolytes  -Follow-up with primary care doctor about fatigue  -Clinic visit with me in 3 months or sooner if needed.      Foods That Contribute to Gas Production  Legumes: Especially dried beans and peas, baked beans, soy beans, lima beans  Milk Products: Milk, ice cream, cheese  Vegetables: Cabbage, broccoli, Brussels sprouts, cauliflower, cucumbers, sauerkraut, kohlrabi, asparagus  Root Vegetables: Potatoes, rutabaga, turnips, radishes, onions  Fruits: Prunes, apricots, apples, raisins, bananas  Cereals & Breads: Cereals, breads, pastries, and all foods containing wheat and wheat products. Check labels  Fatty Foods: Pan-fried or deep-fried foods, fatty meats, rich cream sauces and gravies, pastries, and any high-fat food. Check labels.  Liquids: Carbonated beverages, fizzy medicine    I personally spent 40 minutes face-to-face and non-face-to-face in the care of this patient, which includes all pre, intra, and post visit time on the date of service.  All documented time was specific to the E/M visit and does not include any procedures that may have been performed.

## 2022-12-30 ENCOUNTER — Ambulatory Visit: Admit: 2022-12-30 | Discharge: 2022-12-31 | Payer: MEDICARE | Attending: Gastroenterology | Primary: Gastroenterology

## 2022-12-30 DIAGNOSIS — K5080 Crohn's disease of both small and large intestine without complications: Principal | ICD-10-CM

## 2022-12-30 LAB — BASIC METABOLIC PANEL
ANION GAP: 3 mmol/L — ABNORMAL LOW (ref 5–14)
BLOOD UREA NITROGEN: 25 mg/dL — ABNORMAL HIGH (ref 9–23)
BUN / CREAT RATIO: 22
CALCIUM: 8.6 mg/dL — ABNORMAL LOW (ref 8.7–10.4)
CHLORIDE: 114 mmol/L — ABNORMAL HIGH (ref 98–107)
CO2: 21.8 mmol/L (ref 20.0–31.0)
CREATININE: 1.14 mg/dL
EGFR CKD-EPI (2021) MALE: 65 mL/min/{1.73_m2} (ref >=60)
GLUCOSE RANDOM: 86 mg/dL (ref 70–179)
POTASSIUM: 4.9 mmol/L — ABNORMAL HIGH (ref 3.4–4.8)
SODIUM: 139 mmol/L (ref 135–145)

## 2022-12-30 MED ORDER — SPIRONOLACTONE 50 MG TABLET
ORAL_TABLET | Freq: Every day | ORAL | 5 refills | 30 days | Status: CP
Start: 2022-12-30 — End: 2023-06-28

## 2022-12-30 NOTE — Unmapped (Addendum)
-  Continue Stelara every 8 weeks  -Try and reduce consumption of foods that cause gas (see below)  -Follow up with Dr. Octaviano Glow re diuretic doseage and liver cancer treatment  -Use Imodium as needed to reduce ostomy output  -Bloodwork today to check electrolytes  -Follow-up with primary care doctor about fatigue  -Clinic visit with me in 3 months or sooner if needed.      Foods That Contribute to Gas Production  Legumes: Especially dried beans and peas, baked beans, soy beans, lima beans  Milk Products: Milk, ice cream, cheese  Vegetables: Cabbage, broccoli, Brussels sprouts, cauliflower, cucumbers, sauerkraut, kohlrabi, asparagus  Root Vegetables: Potatoes, rutabaga, turnips, radishes, onions  Fruits: Prunes, apricots, apples, raisins, bananas  Cereals & Breads: Cereals, breads, pastries, and all foods containing wheat and wheat products. Check labels  Fatty Foods: Pan-fried or deep-fried foods, fatty meats, rich cream sauces and gravies, pastries, and any high-fat food. Check labels.  Liquids: Carbonated beverages, fizzy medicine

## 2022-12-31 NOTE — Unmapped (Signed)
Labs ordered by Dr. Stevphen Rochester. Pt has right arm double lumen PICC in place. Curos disinfecting cap present on each lumen. Explained to pt that the unit does not stock Curos disinfecting caps. I further explained to pt that Curos caps are single use only and that I would not be able to place a Curos cap on PICC line port post blood draw. Pt stated that he would like to keep existing Curos caps in place and requested to have labs obtained via venipuncture. Tiburcio Pea, RN

## 2023-01-03 MED ORDER — LANCETS 28 GAUGE
11 refills | 0 days | Status: CP
Start: 2023-01-03 — End: ?

## 2023-01-06 DIAGNOSIS — R188 Other ascites: Principal | ICD-10-CM

## 2023-01-06 DIAGNOSIS — K7581 Nonalcoholic steatohepatitis (NASH): Principal | ICD-10-CM

## 2023-01-06 DIAGNOSIS — K7460 Unspecified cirrhosis of liver: Principal | ICD-10-CM

## 2023-01-06 DIAGNOSIS — C220 Liver cell carcinoma: Principal | ICD-10-CM

## 2023-01-14 NOTE — Unmapped (Signed)
VM from patient's wife [name])    She stated that patient sees Dr. Octaviano Glow and Dr. Stevphen Rochester    She said that the patient had a difficult weekend--he was sick on Friday and his condition went up and down    She is requesting guidance from the providers with regards to when Hospice is needed    Will route this message to Dr. Octaviano Glow and Dr. Stevphen Rochester

## 2023-01-20 ENCOUNTER — Ambulatory Visit: Admit: 2023-01-20 | Discharge: 2023-01-20 | Payer: MEDICARE

## 2023-01-20 DIAGNOSIS — R188 Other ascites: Principal | ICD-10-CM

## 2023-01-20 DIAGNOSIS — K7581 Nonalcoholic steatohepatitis (NASH): Principal | ICD-10-CM

## 2023-01-20 DIAGNOSIS — K7460 Unspecified cirrhosis of liver: Principal | ICD-10-CM

## 2023-01-20 DIAGNOSIS — C220 Liver cell carcinoma: Principal | ICD-10-CM

## 2023-01-20 MED ADMIN — gadobenate dimeglumine (MULTIHANCE) 529 mg/mL (0.1mmol/0.2mL) solution 14.52 mL: .1 mmol/kg | INTRAVENOUS | @ 18:00:00 | Stop: 2023-01-20

## 2023-01-23 NOTE — Unmapped (Signed)
Patient wife called because her husband who needs his PICC Line removed asap, he has had it in since November. She and the home health nurse has been trying to reach his provider to have it remove but has been unsuccessful.     Please call wife back at [phone]

## 2023-01-24 ENCOUNTER — Ambulatory Visit
Admission: EM | Admit: 2023-01-24 | Discharge: 2023-01-24 | Disposition: A | Discharge status: Home / Self Care | Payer: Medicare Other | Attending: Urgent Care | Admitting: Urgent Care

## 2023-01-24 DIAGNOSIS — B029 Zoster without complications: Secondary | ICD-10-CM

## 2023-01-24 MED ORDER — VALACYCLOVIR HCL 1 G PO TABS: 1000.0000 mg | ORAL_TABLET | Freq: Three times a day (TID) | ORAL | 0 refills | Status: DC

## 2023-01-24 NOTE — ED Provider Notes (Signed)
[facility]    CSN: [id number] Arrival date & time: 01/24/23  1250      History   Chief Complaint Chief Complaint  Patient presents with   Rash    HPI Dean Ryan is a 82 y.o. male.    Rash   Patient presents to urgent care with concern for rash on his elbow x 10 days.  He endorses pain at the elbow joint.    He has a PICC line in same arm that he states needs to be removed.  He reports waiting for MD order in order for home health nurse to remove the PICC.    Patient has been applying antibiotic ointment and antihistamine cream.   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 Authorizing Provider  Cholecalciferol 25 MCG (1000 UT) capsule Take 1,000 Units by mouth daily.     [provider]  furosemide (LASIX) 20 MG tablet Take 1 tablet (20 mg total) by mouth daily as needed. 05/09/22   Fritzi Mandes, MD  loratadine (CLARITIN) 10 MG tablet Take 10 mg by mouth daily.     [provider]  losartan (COZAAR) 100 MG tablet Take 100 mg by mouth daily.  11/25/17   [provider]  Magnesium Oxide (MAG-200 PO) Take 1 tablet by mouth daily. 12/27/21   [provider]  metoprolol succinate (TOPROL-XL) 50 MG 24 hr tablet Take 1 tablet by mouth daily. 10/23/17   [provider]  Multiple  Vitamins-Minerals (PRESERVISION AREDS 2 PO) Take 1 tablet by mouth daily.    [provider]  pantoprazole (PROTONIX) 40 MG tablet Take 1 tablet by mouth daily. 04/08/18   [provider]  triamcinolone (KENALOG) 0.025 % ointment Apply to face twice daily for 3 days. Avoid ears and eyelids. 12/20/21   Moye, [place], MD  Ustekinumab Delsa Grana) 45 MG/0.5ML SOLN 33m subcutaneous every 8weeks 12/21/20   [provider]    Family History No family history on file.  Social History Social History   Tobacco Use   Smoking status: Former    Types: Cigarettes    Quit date: 1985    Years since quitting: 39.1   Smokeless tobacco: Never  Substance Use Topics   Alcohol use: Yes   Drug use: Not Currently     Allergies   Patient has no known allergies.   Review of Systems Review of Systems  Skin:  Positive for rash.     Physical Exam Triage Vital Signs ED Triage Vitals [01/24/23 1308]  Enc Vitals Group     BP (!) 145/63     Pulse Rate (!) 58     Resp 18     Temp 97.6 F (36.4 C)     Temp Source Oral     SpO2 98 %  Weight      Height      Head Circumference      Peak Flow      Pain Score      Pain Loc      Pain Edu?      Excl. in [place]?    No data found.  Updated Vital Signs BP (!) 145/63 (BP Location: Left Arm)   Pulse (!) 58   Temp 97.6 F (36.4 C) (Oral)   Resp 18   SpO2 98%   Visual Acuity Right Eye Distance:   Left Eye Distance:   Bilateral Distance:    Right Eye Near:   Left Eye Near:    Bilateral Near:     Physical Exam Vitals reviewed.  Constitutional:      Appearance: Normal appearance.  Skin:    General: Skin is warm.     Findings: Rash present.       Neurological:     General: No focal deficit present.     Mental Status: He is alert and oriented to person, place, and time.  Psychiatric:        Mood and Affect: Mood normal.        Behavior: Behavior normal.      UC Treatments / Results  Labs (all labs  ordered are listed, but only abnormal results are displayed) Labs Reviewed - No data to display  EKG   Radiology No results found.  Procedures Procedures (including critical care time)  Medications Ordered in UC Medications - No data to display  Initial Impression / Assessment and Plan / UC Course  I have reviewed the triage vital signs and the nursing notes.  Pertinent labs & imaging results that were available during my care of the patient were reviewed by me and considered in my medical decision making (see chart for details).   Blistering rash on the patient's right forearm resembling herpetic involvement.  Likely shingles.  Treating with Valtrex.   Final Clinical Impressions(s) / UC Diagnoses   Final diagnoses:  None   Discharge Instructions   None    ED Prescriptions   None    PDMP not reviewed this encounter.   Rose Phi, [place] 01/24/23 1323

## 2023-01-24 NOTE — Discharge Instructions (Signed)
Follow up here or with your primary care provider if your symptoms are worsening or not improving.     

## 2023-01-24 NOTE — ED Triage Notes (Signed)
Patient presents to UC for rash on elbow and back of arm x 10 days. Pain at the elbow joint area. Pt has a PICC line that needs to be removed and they are waiting for MD order for home health nurse to remove. Has been applying antibiotic ointment, antihistamine cream.

## 2023-01-24 NOTE — Unmapped (Signed)
Patient daughter called because her father   who needs his PICC Line removed asap,   he has had it in since November.        Family wants help with getting this taken   care of. Family wants a call back. Beth states the home health nurse can take the PICC line out if she had an order.

## 2023-01-24 NOTE — Unmapped (Signed)
Redirected to ED or UC for suspicious rash near PICC line. There is no charge for this visit.    Ashley Jacobs, FNP

## 2023-01-27 MED ORDER — VALACYCLOVIR 1 GRAM TABLET
ORAL_TABLET | Freq: Three times a day (TID) | ORAL | 0 refills | 7 days | Status: CP
Start: 2023-01-27 — End: 2023-02-03

## 2023-01-27 NOTE — Unmapped (Signed)
Follow-up on in-basket below  Called [place] today with [facility] who left a VM on 2/16 requesting an order to have the pt's PICC line discontinued.  Got Erica's voicemail and left a message with my name & contact information for a callback.    I then found the number for Tiffany @ [facility]. I told Tiffany that I had received permission from Dr. Octaviano Glow to send an order for the PICC line to be discontinued as well to cancel the lab orders. I also let her know that the remaining home health orders need to be sent to the PCP's office for signature. She verbalized understanding and gave me the fax number to send orders about the PICC line & labs to [phone].    Orders faxed to the number above & will be scanned to the media tab of the chart.     Lanora Manis, RN    2/17 @ 233 pm   Mickie Hillier, MD  Kem Kays, RN; Cory Roughen, PA  [facility],  Thanks for handling this.    As far as the labs go, I think I had asked Misty Stanley to send a one time order for BMP but I do not need weekly or even q2week labs. Please go ahead and cancel those.    Regarding the rest of his orders, I am not sure why GI surgery is not handling the PICC line. It looks like the patient's wife has been requesting an order to have it removed for a while now, so you can place that order under my name (order for home health to remove PICC). That is fine with me.    Lastly, I am cc'ing his PCP here for the other home health orders. Those should not be signed by me.    John -- I am not sure if you're aware of all of this but I think there are a lot of cooks in the kitchen here and some confusion about who is managing this patient's home health orders, etc. Is this something you could take care of?    Thanks,  Charmayne Sheer          Previous Messages       ----- Message -----  From: Kem Kays, RN  Sent: 01/24/2023   2:28 PM EST  To: Mickie Hillier, MD  Subject: FW: ?home health labs                            Dr. Octaviano Glow,    I can see a note in the chart where [facility] sent lab orders to [facility] on 12/26.  I see some lab results in the media tab from 2/7.  I can call the Home Health company and tell them to cancel any lab orders that they have with your name on them if you want me to do that.    I've also received Home Health orders for you to sign that were faxed on 2/6 if you could please review them & let me know if you want to sign all/some/none of the orders? I will email them to you. They are dated 01/02/22,    Also--The family is requesting an order from you to pull his PICC line that now has a rash around it.  The PICC line was placed when he was hospitalized @ [facility] in November under GI surgery.   Should I tell them to get the  order from their PCP or go to the ED and have it pulled?    Lanora Manis

## 2023-01-30 ENCOUNTER — Telehealth: Admit: 2023-01-30 | Discharge: 2023-01-31 | Payer: MEDICARE

## 2023-01-30 MED ORDER — SUVOREXANT 10 MG TABLET
ORAL_TABLET | Freq: Every evening | ORAL | 3 refills | 30 days | Status: CP | PRN
Start: 2023-01-30 — End: ?

## 2023-01-30 MED ORDER — BLOOD SUGAR DIAGNOSTIC STRIPS
Freq: Once | 3 refills | 0 days | Status: CP
Start: 2023-01-30 — End: 2023-01-30

## 2023-01-31 MED ORDER — SUVOREXANT 10 MG TABLET
ORAL_TABLET | Freq: Every evening | ORAL | 0 refills | 30 days | Status: CP | PRN
Start: 2023-01-31 — End: ?

## 2023-02-03 DIAGNOSIS — K7581 Nonalcoholic steatohepatitis (NASH): Principal | ICD-10-CM

## 2023-02-03 DIAGNOSIS — C220 Liver cell carcinoma: Principal | ICD-10-CM

## 2023-02-03 DIAGNOSIS — K7460 Unspecified cirrhosis of liver: Principal | ICD-10-CM

## 2023-02-03 DIAGNOSIS — R188 Other ascites: Principal | ICD-10-CM

## 2023-02-03 NOTE — Unmapped (Signed)
Referring Provider: Cory Roughen, PA     PCP: Cory Roughen, [place]    02/05/2023    ASSESSMENT/PLAN:    [name] is a 82 y.o. patient with a past medical history significant for T2DM, Crohn's disease who is being seen in clinic for evaluation of CKD.    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 Non-proteinuric. In spite of several recent admissions for enterocutaneous fistula and then volume overload, Cr has remained close to baseline 1 - 1.2.   - ACE/ARB: Defer given AKI risk and lack of significant proteinuria.   - SGLTi: same as above   - Volume: multifactorial with diastolic dysfunction and cirrhosis. Continue spiro 50 mg, start lasix 20 mg prn.     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 He had an echo in Dec '23 that showed EF 55%, grade II diastolic dysfunction. Admitted in Dec '23 with volume overload requiring IV diuresis. Currently on spironolactone 50 mg daily  - Continue spiro 50 mg daily  - Start lasix 20 mg daily  - Continue metoprolol 50 mg daily     Nephrolithiasis: CT's have shown numerous non-obstructing renal calculi. 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  - Previously counseled on oxalate avoidance, fluid intake, salt limitation    Hypertension: Very well controlled BP's on metoprolol 50 mg daily and spironolactone 50 mg daily.     Bone Mineral Metabolism: He has known osteoporosis perhaps related to prior prolonged steroid exposure. He received zoledronic acid infusion last on 09/18/22. PTH 154, Vitamin D 25 on 10/16/22.     Crohn's Disease: He has had a prolonged course with numerous biologic therapies and steroid courses, most recently on ustekinumab. Most recently admitted in Nov '23 with reeruption of intended orocutaneous fistula and abdominal wall fluid collection. General surgery and GI following.   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 Follows with Dr. Octaviano Glow.   - Volume mgmt as above  - Mgmt of HCC will depend on multi-D discussion - may not be eligible for treatment.     Anemia:  Hgb 11.7 on 12/17/22, check iron studies with next labs.     Health Maintenance:     Immunization History   Administered Date(s) Administered    COVID-19 VACC,MRNA,(PFIZER)(PF) 03/10/2020    Covid-19 Vacc, Unspecified 02/17/2020    INFLUENZA QUAD ADJUVANTED 68YR UP(FLUAD) 11/04/2022    INFLUENZA TIV (TRI) 55MO+ W/ PRESERV (IM) 11/14/2008    Influenza Nasal, Unspecified Formulation 08/30/2019    Influenza Vaccine Quad(IM)6 MO-Adult(PF) 09/23/2020    Novel Influenza-H1N1-09 11/14/2008    PNEUMOCOCCAL POLYSACCHARIDE 23-VALENT 03/20/2017    PPD Test 07/08/2018    SHINGRIX-ZOSTER VACCINE (HZV),RECOMBINANT,ADJUVANTED(IM) 08/30/2019    TdaP 05/22/2021     # Kidney Advanced Care Planning   - Computed KFRE 2-Year unavailable. One or more values for this score either were not found within the given timeframe or did not fit some other criterion.  - Computed KFRE 5-Year unavailable. One or more values for this score either were not found within the given timeframe or did not fit some other criterion.   - Patient preferences/goals: We discussed low risk of progression to ESKD. He is currently establishing with outpatient palliative  care, which will be very helpful in clarifying goals. If he were to develop more advanced CKD, I would recommend conservative kidney management over renal replacement therapy.         [name] will follow up  PRN     Patient was seen and discussed with Dr. Ethelda Chick who is in agreement with the plan.    Chief Complaint: CKD    HPI:  [name] is a 82 y.o. male with a past medical history of Crohn's disease s/p multiple abdominal surgeries who is was seen in consultation at the request of Cory Roughen, PA for evaluation of CKD.      Pt with history of Crohn's disease c/b numerous fistulae and abscesses requiring numerous surgeries including hemicolectomy with ileocolonic anastomosis and left sided colostomy. He has been treated with influiximab, certolizumab, adalimumab - the latter of which was c/b decreased EF in 2018. He stopped adalimumab with improvement in EF. Subsequently on ustekinumab which he is currently on. nHe has had recurrent admissions for SBO.     He was admitted in [place] with SBO in May '23, at which time CT showed anterior abdominal fluid collection. He followed up with general surgery 07/17/22 and was recommended to follow this with serial imaging. He had CT enterography 07/17/22 which showed decreased anterior abdominal wall air/fluid collection. 11/13/2022 CTE showed Resolution of large abdominal wall fluid collection.     Throughout this course he has had fluctuating renal function -previously with baseline Cr of 1 as of 2021, increased up to 1.6's in Dec '22, improve to 1 in Dec '22 and up to 1.5-1.7 summer of '23. Improved to Cr 1 as of Jan '24.     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 He had some sort of testing done years ago. He last had a symptomatic kidney stones several years ago.     Interval:  Since last visit:    Admitted nov '23 with reeruption of intended orocutaneous fistula and abdominal wall fluid collection. Disease activity was demonstrated on CT scan.     He was admitted in Dec '23 for edema due to volume overload. He had been on 2 mg Bumex p.o. twice daily at home prior to admission.  He was started on a regimen of intravenous Lasix 80 mg twice daily. TPN was stopped. Diagnosed with new HCC and liver cirrhosis.     His wife reports he has been struggling since these admissions. He is on rifaximin and if he misses a few doses he gets confused. He has been queasy and nauseated requiring daily zofran. Appetite comes and goes.They are getting enrolled in outpatient palliative care.    His swelling is still present but back to normal. He is just on spironolactone 50 mg daily. He was taken off loop diuretics. Breathing is improved compared to 3 months ago.     Ostomy output has been normal on imodium.     ROS:  11 systems reviewed and negative except those noted in the history of present illness    PAST MEDICAL HISTORY:  Past Medical History:   Diagnosis Date    Anal fissure 1983    Apnea, sleep     uses CPAP at night. instructed to bring DOS.     Basal cell carcinoma     Cancer (CMS-HCC)  skin cancer    CHF (congestive heart failure) (CMS-HCC)     Crohn's disease (CMS-HCC)     Diverticulitis of colon     Fatty liver     Gallstones     GERD (gastroesophageal reflux disease)     H/O echocardiogram     Hypertension     Irritable bowel syndrome     Kidney stone     NASH (nonalcoholic steatohepatitis)     Syncope     dizziness    Type 2 diabetes mellitus, without long-term current use of insulin (CMS-HCC) 10/24/2022       ALLERGIES  Patient has no known allergies.    SOCIAL HISTORY  Social History     Socioeconomic History    Marital status: Married    Number of children: 2    Highest education level: Master's degree (e.g., MA, MS, MEng, MEd, MSW, MBA)   Tobacco Use    Smoking status: Former     Current packs/day: 0.00     Average packs/day: 1 pack/day for 20.0 years (20.0 ttl pk-yrs)     Types: Cigarettes     Start date: 12/09/1964     Quit date: 12/09/1984     Years since quitting: 38.1    Smokeless tobacco: Never   Vaping Use    Vaping status: Never Used   Substance and Sexual Activity    Alcohol use: Yes     Comment: occassionally    Drug use: No    Sexual activity: Yes     Partners: Female     Birth control/protection: None     Social Determinants of Health     Financial Resource Strain: Low Risk  (11/18/2022)    Overall Financial Resource Strain (CARDIA)     Difficulty of Paying Living Expenses: Not hard at all   Food Insecurity: No Food Insecurity (11/18/2022)    Hunger Vital Sign     Worried About Running Out of Food in the Last Year: Never true     Ran Out of Food in the Last Year: Never true   Transportation Needs: No Transportation Needs (11/18/2022)    PRAPARE - Therapist, art (Medical): No     Lack of Transportation (Non-Medical): No   Physical Activity: Inactive (04/19/2021)    Exercise Vital Sign     Days of Exercise per Week: 0 days     Minutes of Exercise per Session: 0 min   Stress: No Stress Concern Present (04/19/2021)    [employer] of Occupational Health - Occupational Stress Questionnaire     Feeling of Stress : Not at all   Social Connections: Socially Integrated (04/19/2021)    Social Connection and Isolation Panel [NHANES]     Frequency of Communication with Friends and Family: More than three times a week     Frequency of Social Gatherings with Friends and Family: More than three times a week     Attends Religious Services: More than 4 times per year     Active Member of [employer] or Organizations: Yes     Attends Engineer, structural: More than 4 times per year     Marital Status: Married         FAMILY HISTORY  Family History   Problem Relation Age of Onset    Heart disease Mother         pacemaker    Arthritis Mother     Dementia Father  Cancer Father         MEDICATIONS:  Current Outpatient Medications   Medication Sig Dispense Refill    acetaminophen (TYLENOL) 500 MG tablet Take 2 tablets (1,000 mg total) by mouth once as needed for pain.      Bacillus coagulans/inulin (PROBIOTIC WITH PREBIOTIC ORAL) Take 2 capsules by mouth daily.      blood sugar diagnostic (GLUCOSE BLOOD) Strp Check blood sugar as directed once a day and for symptoms of high or low blood sugar. 100 strip 0    blood-glucose meter kit Use as instructed 1 each 0    cholecalciferol, vitamin D3-25 mcg, 1,000 unit,, 25 mcg (1,000 unit) capsule Take 1 capsule (25 mcg total) by mouth daily.      doxepin (SINEQUAN) 10 mg/mL solution 0.38ml by mouth up to 1 hour before bedtime. 120 mL 12    EPINEPHrine (EPIPEN) 0.3 mg/0.3 mL injection Inject 0.3 mL (0.3 mg total) under the skin once.      furosemide (LASIX) 20 MG tablet Take 1 tablet (20 mg total) by mouth daily as needed. 90 tablet 1    guaiFENesin (MUCINEX) 1,200 mg Ta12 Take 1 tablet by mouth every twelve (12) hours as needed.      guaiFENesin (ROBITUSSIN) 100 mg/5 mL syrup Take 10 mL (200 mg total) by mouth every six (6) hours as needed for congestion or cough for up to 7 days. 180 mL 0    lancets (FREESTYLE) 28 gauge Misc Check blood sugar as directed once a day and for symptoms of high or low blood sugar. 100 each 11    loperamide (IMODIUM) 2 mg capsule Take 1 capsule (2 mg total) by mouth two (2) times a day. You may increase up to 2 capsules four times a day to keep ostomy output around 1 L/day 60 capsule 2    loratadine (CLARITIN) 10 mg tablet Take 1 tablet (10 mg total) by mouth daily.      metoprolol succinate (TOPROL-XL) 50 MG 24 hr tablet TAKE 1 TABLET DAILY (Patient taking differently: Take 1 tablet (50 mg total) by mouth daily.) 90 tablet 3    multivitamin (TAB-A-VITE/THERAGRAN) per tablet Take 1 tablet by mouth daily.      ondansetron (ZOFRAN) 8 MG tablet Take 1 tablet (8 mg total) by mouth every twelve (12) hours as needed for nausea. 180 tablet 3    pantoprazole (PROTONIX) 40 MG tablet TAKE 1 TABLET DAILY (Patient taking differently: Take 1 tablet (40 mg total) by mouth daily.) 90 tablet 2    rifAXIMin (XIFAXAN) 550 mg Tab Take 1 tablet (550 mg total) by mouth two (2) times a day. 180 tablet 3    spironolactone (ALDACTONE) 50 MG tablet Take 1 tablet (50 mg total) by mouth daily. 30 tablet 5    suvorexant (BELSOMRA) 10 mg tablet Take 1 tablet (10 mg total) by mouth nightly as needed for sleep. 30 tablet 3    suvorexant (BELSOMRA) 10 mg tablet Take 1 tablet (10 mg total) by mouth nightly as needed for sleep. 30 tablet 0    syringe, disposable, 1 mL Syrg To be used with doxepin oral solution. 25 each 0    ustekinumab (STELARA) 45 mg/0.5 mL Soln 90mg  subcutaneous every 8weeks (Patient taking differently: Inject 90 mg under the skin. 90mg  subcutaneous every 8weeks) 1 mL 5    vit C/E/Zn/coppr/lutein/zeaxan (PRESERVISION AREDS 2 ORAL) Take 1 tablet by mouth two (2) times a day.      vitamin E acetate (  VITAMIN E ORAL) Take 400 Units by mouth two (2) times a day.       No current facility-administered medications for this visit.       PHYSICAL EXAM:     Vitals:    02/05/23 1155   BP: 123/45   Pulse: 59   Temp: 36.2 ??C (97.2 ??F)        CONSTITUTIONAL: frail, fatigued   HEENT: Moist mucous membranes, oropharynx clear without erythema or exudate  EYES: Extra ocular movements intact.  sclerae anicteric.  CARDIOVASCULAR: Regular, normal S1/S2 heart sounds, no murmurs, no rubs.   PULM: diminished breath sounds in bilateral bases  GASTROINTESTINAL: Soft, nontender, non-distended   EXTREMITIES: 1+ edema bilaterally   SKIN: No rashes or lesions  NEUROLOGIC: No focal motor or sensory deficits  PSYCH: alert and oriented x 3    MEDICAL DECISION MAKING    Results for orders placed or performed in visit on 12/30/22   Basic Metabolic Panel   Result Value Ref Range    Sodium 139 135 - 145 mmol/L    Potassium 4.9 (H) 3.4 - 4.8 mmol/L    Chloride 114 (H) 98 - 107 mmol/L    CO2 21.8 20.0 - 31.0 mmol/L    Anion Gap 3 (L) 5 - 14 mmol/L    BUN 25 (H) 9 - 23 mg/dL    Creatinine 1.61 0.96 - 1.18 mg/dL    BUN/Creatinine Ratio 22     eGFR CKD-EPI (2021) Male 65 >=60 mL/min/1.44m2    Glucose 86 70 - 179 mg/dL    Calcium 8.6 (L) 8.7 - 10.4 mg/dL        Creatinine Whole Blood, POC   Date Value Ref Range Status   08/02/2022 1.3 0.8 - 1.4 mg/dL Final   04/54/0981 1.3 0.8 - 1.4 mg/dL Final     Creatinine   Date Value Ref Range Status   12/30/2022 1.14 0.73 - 1.18 mg/dL Final   19/14/7829 5.62 (H) 0.73 - 1.18 mg/dL Final   13/07/6577 4.69 (H) 0.73 - 1.18 mg/dL Final   62/95/2841 3.24 (H) 0.73 - 1.18 mg/dL Final   40/09/2724 3.66 0.73 - 1.18 mg/dL Final   44/02/4741 5.95 0.80 - 1.30 mg/dL Final     Comment:     * This creatinine method is traceable to a GC-IDMS method and NIST standard reference material.     No results found for: GFR   BUN   Date Value Ref Range Status   12/30/2022 25 (H) 9 - 23 mg/dL Final   63/87/5643 28 (H) 9 - 23 mg/dL Final   32/95/1884 26 (H) 9 - 23 mg/dL Final   16/60/6301 28 (H) 9 - 23 mg/dL Final   60/09/9322 29 (H) 9 - 23 mg/dL Final   55/73/2202 18 7 - 18 mg/dL Final      Phosphorus   Date Value Ref Range Status   11/18/2022 2.7 2.4 - 5.1 mg/dL Final   54/27/0623 2.9 2.4 - 5.1 mg/dL Final   76/28/3151 3.5 2.4 - 5.1 mg/dL Final   76/16/0737 3.3 2.4 - 5.1 mg/dL Final   10/62/6948 2.9 2.4 - 5.1 mg/dL Final      Calcium   Date Value Ref Range Status   12/30/2022 8.6 (L) 8.7 - 10.4 mg/dL Final   54/62/7035 8.7 8.7 - 10.4 mg/dL Final   00/93/8182 7.9 (L) 8.7 - 10.4 mg/dL Final   99/37/1696 8.2 (L) 8.7 - 10.4 mg/dL Final   78/93/8101 7.7 (L) 8.7 -  10.4 mg/dL Final   16/09/9603 8.5 8.5 - 10.1 mg/dL Final      PTH   Date Value Ref Range Status   10/16/2022 154.4 (H) 18.4 - 80.1 pg/mL Final      HGB   Date Value Ref Range Status   12/17/2022 11.7 (L) 12.9 - 16.5 g/dL Final   54/08/8118 14.7 (L) 12.9 - 16.5 g/dL Final   82/95/6213 08.6 (L) 12.9 - 16.5 g/dL Final   57/84/6962 95.2 (L) 12.9 - 16.5 g/dL Final   84/13/2440 10.2 (L) 12.9 - 16.5 g/dL Final   72/53/6644 03.4 13.5 - 17.5 g/dL Final      CO2   Date Value Ref Range Status   12/30/2022 21.8 20.0 - 31.0 mmol/L Final   12/17/2022 21.2 20.0 - 31.0 mmol/L Final   11/22/2022 28.4 20.0 - 31.0 mmol/L Final   11/21/2022 28.9 20.0 - 31.0 mmol/L Final   11/18/2022 30.3 20.0 - 31.0 mmol/L Final   03/23/2014 23.0 21.0 - 32.0 mmol/L Final           IMAGING STUDIES:     07/18/22 MRI with and without contrast:   KIDNEYS/URETERS: Stable bilateral renal cortical thinning/atrophy. Bilateral renal cysts. No visualized suspicious enhancing lesions with evaluation of the posterior lateral right kidney limited by susceptibility artifact, possibly secondary to patient's IV apparatus.

## 2023-02-04 NOTE — Unmapped (Signed)
Patient's wife called due to patient's pharmacy being affected by [facility] and unable to receive e-scripts.     Patient is requesting that prescriptions for diabetic test strips and suvorexant 10 mg tablets be re-sent via fax to [facility] in [place], [place]. Routing to [employer], CMA, to consult with [name], [facility], when he is back in office 02/05/23.

## 2023-02-05 ENCOUNTER — Ambulatory Visit
Admit: 2023-02-05 | Discharge: 2023-02-06 | Payer: MEDICARE | Attending: Student in an Organized Health Care Education/Training Program | Primary: Student in an Organized Health Care Education/Training Program

## 2023-02-05 DIAGNOSIS — N1832 Chronic kidney disease, stage 3b (CMS-HCC): Principal | ICD-10-CM

## 2023-02-05 MED ORDER — FUROSEMIDE 20 MG TABLET
ORAL_TABLET | Freq: Every day | ORAL | 1 refills | 90 days | Status: CP | PRN
Start: 2023-02-05 — End: 2024-02-05

## 2023-02-05 NOTE — Unmapped (Signed)
Pa started via epic. Case id: [id number]

## 2023-02-06 NOTE — Unmapped (Signed)
Spoke with Drenda Freeze. Verbal orders provided for palliative care per Jonny Ruiz. No further action needed.

## 2023-02-06 NOTE — Unmapped (Signed)
Drenda Freeze with [facility] called in today requesting verbal orders to start palliative care    Please return a call to [place] at  [phone]. It's is okay to leave a message including your name & credentials

## 2023-02-12 NOTE — Unmapped (Signed)
PRIOR AUTHORIZATION:     Prior Authorization for Thomas Rosales has been: denied. Denail scanned to media.

## 2023-02-13 NOTE — Unmapped (Signed)
Thomas Rosales with [facility] called requesting verbal orders for discharge from [facility].    Patient is doing great.    Thomas Rosales with [facility] best call back number is [phone], please leave your name and credentials

## 2023-02-14 NOTE — Unmapped (Signed)
Attempted to reach [facility].  Left message with verbal order to proceed with discharge from their services.  Provided call back number and fax number in case a signature is needed.

## 2023-02-17 DIAGNOSIS — K7581 Nonalcoholic steatohepatitis (NASH): Principal | ICD-10-CM

## 2023-02-17 DIAGNOSIS — K7460 Unspecified cirrhosis of liver: Principal | ICD-10-CM

## 2023-02-17 DIAGNOSIS — R188 Other ascites: Principal | ICD-10-CM

## 2023-02-17 DIAGNOSIS — C220 Liver cell carcinoma: Principal | ICD-10-CM

## 2023-02-19 NOTE — Unmapped (Signed)
Complex Case Management  SUMMARY NOTE    High Risk Care Coordinator  spoke with patient and verified correct patient using two identifiers today to introduce the Complex Case Management program.     Discussed the following:  Program Services, Expectations of participation, and Verified Demographics    Program status: Declined  Patient states that he does not need the services of the Complex Case Management program at this time. Care Coordinator has voiced understanding and will send our contact information to patient via his [place] My Chart shall he need to utilize our services in the future as patient has voiced understanding.       Victorina Kable - High Risk Care Coordinator   [facility]  [address], [address]  [place], [place] [address]  P: [phone] F: [phone]  [email or web address]

## 2023-02-20 ENCOUNTER — Ambulatory Visit (INDEPENDENT_AMBULATORY_CARE_PROVIDER_SITE_OTHER): Payer: Medicare Other | Admitting: Dermatology

## 2023-02-20 ENCOUNTER — Encounter: Payer: Self-pay | Admitting: Dermatology

## 2023-02-20 VITALS — BP 144/66 | HR 62

## 2023-02-20 DIAGNOSIS — C4492 Squamous cell carcinoma of skin, unspecified: Secondary | ICD-10-CM

## 2023-02-20 DIAGNOSIS — L814 Other melanin hyperpigmentation: Secondary | ICD-10-CM | POA: Diagnosis not present

## 2023-02-20 DIAGNOSIS — Z1283 Encounter for screening for malignant neoplasm of skin: Secondary | ICD-10-CM | POA: Diagnosis not present

## 2023-02-20 DIAGNOSIS — D229 Melanocytic nevi, unspecified: Secondary | ICD-10-CM

## 2023-02-20 DIAGNOSIS — L720 Epidermal cyst: Secondary | ICD-10-CM

## 2023-02-20 DIAGNOSIS — L570 Actinic keratosis: Secondary | ICD-10-CM | POA: Diagnosis not present

## 2023-02-20 DIAGNOSIS — C44222 Squamous cell carcinoma of skin of right ear and external auricular canal: Secondary | ICD-10-CM | POA: Diagnosis not present

## 2023-02-20 DIAGNOSIS — D492 Neoplasm of unspecified behavior of bone, soft tissue, and skin: Secondary | ICD-10-CM

## 2023-02-20 DIAGNOSIS — L821 Other seborrheic keratosis: Secondary | ICD-10-CM | POA: Diagnosis not present

## 2023-02-20 HISTORY — DX: Squamous cell carcinoma of skin, unspecified: C44.92

## 2023-02-20 NOTE — Progress Notes (Signed)
Follow-Up Visit   Subjective  Dean Ryan is a 82 y.o. male who presents for the following: Skin Cancer Screening and Full Body Skin Exam  The patient presents for Total-Body Skin Exam (TBSE) for skin cancer screening and mole check. The patient has spots, moles and lesions to be evaluated, some may be new or changing and the patient has concerns that these could be cancer.  Patient has a spot at right ear, present for 3-4 weeks. It has been sore.  Also a spot at right groin, present for about 20 years but seems to be getting larger.  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   The following portions of the chart were reviewed this encounter and updated as appropriate: medications, allergies, medical history  Review of Systems:  No other skin or systemic complaints except as noted in HPI or Assessment and Plan.  Objective  Well appearing patient in no apparent distress; mood and affect are within normal limits.  A full examination was performed including scalp, head, eyes, ears, nose, lips, neck, chest, axillae, abdomen, back, buttocks, bilateral upper extremities, bilateral lower extremities, hands, feet, fingers, toes, fingernails, and toenails. All findings within normal limits unless otherwise noted below.    Right Ear 0.6 cm tender firm papule R/o SCC      R dorsal hand x 1, vertex scalp x 1, R cheek x 1 (3) Erythematous thin papules/macules with gritty scale.   Right Inguinal Fold Subcutaneous nodule.     Assessment & Plan   Neoplasm of skin Right Ear  Epidermal / dermal shaving  Lesion diameter (cm):  0.6 Informed consent: discussed and consent obtained   Timeout: patient name, date of birth, surgical site, and procedure verified   Anesthesia: the lesion was anesthetized in a standard fashion   Anesthetic:  1% lidocaine w/ epinephrine 1-100,000 local infiltration (0.7 cc) Instrument used: flexible razor blade    Hemostasis achieved with: aluminum chloride   Outcome: patient tolerated procedure well   Post-procedure details: wound care instructions given   Additional details:  Mupirocin and a bandage applied  Destruction of lesion  Destruction method: electrodesiccation and curettage   Informed consent: discussed and consent obtained   Timeout:  patient name, date of birth, surgical site, and procedure verified Anesthesia: the lesion was anesthetized in a standard fashion   Anesthetic:  1% lidocaine w/ epinephrine 1-100,000 buffered w/ 8.4% NaHCO3 Curettage performed in three different directions: Yes   Electrodesiccation performed over the curetted area: Yes   Curettage cycles:  3 Final wound size (cm):  1.4 Hemostasis achieved with:  electrodesiccation Outcome: patient tolerated procedure well with no complications   Post-procedure details: sterile dressing applied and wound care instructions given   Dressing type: petrolatum    Specimen 1 - Surgical pathology Differential Diagnosis: R/o SCC  Check Margins: No 0.6 cm tender firm papule Treated with EDC   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 They are chronic with expected duration over 1 year. A portion of actinic keratoses will progress to squamous cell carcinoma of the skin. It is not possible to reliably predict which spots will progress to skin cancer and so treatment is recommended to  prevent development of skin cancer.  Recommend daily broad spectrum sunscreen SPF 30+ to sun-exposed areas, reapply every 2 hours as needed.  Recommend staying in the shade or wearing long sleeves, sun glasses (UVA+UVB protection) and wide brim hats (4-inch brim around  the entire circumference of the hat). Call for new or changing lesions.  Patient with liver cancer and cirrhosis. Treatment focused on symptomatic lesions and very hypertrophic lesions.  Will otherwise monitor at this time.  Prior to procedure, discussed risks of blister formation, small wound, skin dyspigmentation, or rare scar following cryotherapy. Recommend Vaseline ointment to treated areas while healing.   Destruction of lesion - R dorsal hand x 1, vertex scalp x 1, R cheek x 1  Destruction method: cryotherapy   Informed consent: discussed and consent obtained   Lesion destroyed using liquid nitrogen: Yes   Cryotherapy cycles:  2 Outcome: patient tolerated procedure well with no complications   Post-procedure details: wound care instructions given    Epidermal inclusion cyst Right Inguinal Fold  Benign-appearing. Exam most consistent with an epidermal inclusion cyst. Discussed that a cyst is a benign growth that can grow over time and sometimes get irritated or inflamed. Recommend observation if it is not bothersome. Discussed option of surgical excision to remove it if it is growing, symptomatic, or other changes noted. Please call for new or changing lesions so they can be evaluated.     Lentigines, Seborrheic Keratoses, Hemangiomas - Benign normal skin lesions - Benign-appearing - Call for any changes  Melanocytic Nevi - Tan-brown and/or pink-flesh-colored symmetric macules and papules - Benign appearing on exam today - Observation - Call clinic for new or changing moles - Recommend daily use of broad spectrum spf 30+ sunscreen to sun-exposed areas.   Actinic Damage - Chronic condition, secondary to cumulative UV/sun exposure - diffuse scaly erythematous macules with underlying dyspigmentation - Recommend daily broad spectrum sunscreen SPF 30+ to sun-exposed areas, reapply every 2 hours as needed.  - Staying in the shade or wearing long sleeves, sun glasses (UVA+UVB  protection) and wide brim hats (4-inch brim around the entire circumference of the hat) are also recommended for sun protection.  - Call for new or changing lesions.  Skin cancer screening performed today.  RTC 3-6 months  I, Margarette Asal, RMA, scribed for Alfonso Patten, MD.  Documentation: I have reviewed the above documentation for accuracy and completeness, and I agree with the above.  Forest Gleason, MD

## 2023-02-20 NOTE — Patient Instructions (Signed)
Cryotherapy Aftercare  Wash gently with soap and water everyday.   Apply Vaseline and Band-Aid daily until healed.    Wound Care Instructions  Cleanse wound gently with soap and water once a day then pat dry with clean gauze. Apply a thin coat of Petrolatum (petroleum jelly, "Vaseline") over the wound (unless you have an allergy to this). We recommend that you use a new, sterile tube of Vaseline. Do not pick or remove scabs. Do not remove the yellow or white "healing tissue" from the base of the wound.  Cover the wound with fresh, clean, nonstick gauze and secure with paper tape. You may use Band-Aids in place of gauze and tape if the wound is small enough, but would recommend trimming much of the tape off as there is often too much. Sometimes Band-Aids can irritate the skin.  You should call the office for your biopsy report after 1 week if you have not already been contacted.  If you experience any problems, such as abnormal amounts of bleeding, swelling, significant bruising, significant pain, or evidence of infection, please call the office immediately.  FOR ADULT SURGERY PATIENTS: If you need something for pain relief you may take 1 extra strength Tylenol (acetaminophen) AND 2 Ibuprofen (200mg each) together every 4 hours as needed for pain. (do not take these if you are allergic to them or if you have a reason you should not take them.) Typically, you may only need pain medication for 1 to 3 days.    

## 2023-02-24 ENCOUNTER — Encounter: Payer: Self-pay | Admitting: Dermatology

## 2023-02-26 ENCOUNTER — Telehealth: Payer: Self-pay

## 2023-02-26 NOTE — Telephone Encounter (Signed)
-----   Message from Alfonso Patten, MD sent at 02/26/2023  9:48 AM EDT ----- Skin , right ear SQUAMOUS CELL CARCINOMA, KERATOACANTHOMA TYPE --> already treated with ED&C. Call if anything growing back and recheck at follow-up.  MAs please call. Thank you!

## 2023-02-26 NOTE — Telephone Encounter (Signed)
Discussed pathology results. Patient voiced understanding. [place] for recheck. RTC if anything grows back before next scheduled appointment

## 2023-03-03 DIAGNOSIS — K7460 Unspecified cirrhosis of liver: Principal | ICD-10-CM

## 2023-03-03 DIAGNOSIS — R188 Other ascites: Principal | ICD-10-CM

## 2023-03-03 DIAGNOSIS — C220 Liver cell carcinoma: Principal | ICD-10-CM

## 2023-03-03 DIAGNOSIS — K7581 Nonalcoholic steatohepatitis (NASH): Principal | ICD-10-CM

## 2023-03-17 ENCOUNTER — Other Ambulatory Visit: Payer: Self-pay | Admitting: Cardiovascular Disease

## 2023-03-17 DIAGNOSIS — K7460 Unspecified cirrhosis of liver: Principal | ICD-10-CM

## 2023-03-17 DIAGNOSIS — R188 Other ascites: Principal | ICD-10-CM

## 2023-03-17 DIAGNOSIS — K7581 Nonalcoholic steatohepatitis (NASH): Principal | ICD-10-CM

## 2023-03-17 DIAGNOSIS — C220 Liver cell carcinoma: Principal | ICD-10-CM

## 2023-03-17 DIAGNOSIS — Z0000 Encounter for general adult medical examination without abnormal findings: Secondary | ICD-10-CM

## 2023-03-19 NOTE — Unmapped (Signed)
Received call from Sharlene Dory, RN with Palliative Care Services Provider who stated their provider suggested two additions to his medication regimen.    (1) temazepam 15 mg nightly to improve sleep  (2) Roxanol liquid 5 mg/0.25 mL every four hours for pain related to partial bowel obstructions which he waits out rather than going to the ED    Informed her message will be relayed to Thomas Rosales and his Medical Assistance.  Best call back number is [phone].

## 2023-03-21 ENCOUNTER — Telehealth: Admit: 2023-03-21 | Discharge: 2023-03-22 | Payer: MEDICARE

## 2023-03-21 DIAGNOSIS — K50813 Crohn's disease of both small and large intestine with fistula: Principal | ICD-10-CM

## 2023-03-21 MED ORDER — PANTOPRAZOLE 40 MG TABLET,DELAYED RELEASE
ORAL_TABLET | Freq: Every day | ORAL | 3 refills | 90 days | Status: CP
Start: 2023-03-21 — End: ?

## 2023-03-21 MED ORDER — MORPHINE CONCENTRATE 100 MG/5 ML (20 MG/ML) ORAL SOLUTION
ORAL | 0 refills | 10 days | Status: CP | PRN
Start: 2023-03-21 — End: ?

## 2023-03-21 NOTE — Unmapped (Signed)
This visit is conducted via [name], applications.      Patient is currently located in the state of [place].  I have identified myself to the patient and conveyed my credentials to Thomas Rosales  I have explained the capabilities and limitations of telemedicine and the patient and myself both agree that it is appropriate for their current circumstances/symptoms.  In case we get disconnected, patient's phone number is There are no phone numbers on file.   Patient has signed informed consent on file in medical record.  Is there someone else in the room? No.       Subjective:      Thomas Rosales is a 82 y.o. male who presents  for a video visit amidst COVID-19 outbreak.     Fu.    Recommended by his palliative care team that he have morphine as needed for temporary use for pain associated with bowel obsturction.    Past Medical/Surgical History:     Past Medical History:   Diagnosis Date    Anal fissure 1983    Apnea, sleep     uses CPAP at night. instructed to bring DOS.     Basal cell carcinoma     Cancer (CMS-HCC)     skin cancer    CHF (congestive heart failure) (CMS-HCC)     Crohn's disease (CMS-HCC)     Diverticulitis of colon     Fatty liver     Gallstones     GERD (gastroesophageal reflux disease)     H/O echocardiogram     Hypertension     Irritable bowel syndrome     Kidney stone     NASH (nonalcoholic steatohepatitis)     Syncope     dizziness    Type 2 diabetes mellitus, without long-term current use of insulin (CMS-HCC) 10/24/2022     Past Surgical History:   Procedure Laterality Date    APPENDECTOMY      BOWEL RESECTION  1981, 1984, 1995    3 ft small intestine, <1 ft large intestines, cecum removed; 2nd bowel resection anal fistulas removed; 3rd bowel resection most of colon removed, colostomy placed.    CARDIAC CATHETERIZATION      CARDIOVASCULAR STRESS TEST      CHOLECYSTECTOMY      COLON SURGERY      Same as small bowel surgery    COLOSTOMY  1995    EYE SURGERY  1/20 & 4/21    Cataract & vitrious removal    FRACTURE SURGERY  1961    Broken tibia and fibula    HERNIA REPAIR  2019    Done by Dr Day    LAPAROSCOPIC SMALL BOWEL RESECTION      LEG SURGERY Left     pins in shin    LITHOTRIPSY      PR COLONOSCOPY FLX DX W/COLLJ SPEC WHEN PFRMD  03/19/2018    Procedure: COLONOSCOPY WITH POSSIBLE BIOPSY, POSSIBLE POLYPECTOMY.;  Surgeon: Tressie Ellis, MD;  Location: ENDO OR [facility];  Service: Gastroenterology    PR COLSC FLEXIBLE W/TRANSENDOSCOPIC BALLOON DILAT N/A 09/13/2022    Procedure: COLONOSCOPY, FLEXIBLE; WITH DILATION BY BALLOON, 1 OR MORE STRICTURES;  Surgeon: Luanne Bras, MD;  Location: [facility];  Service: Gastroenterology    PR INCISION & DRAINAGE ABSCESS SIMPLE/SINGLE N/A 08/11/2018    Procedure: INCISION AND DRAINAGE OF ABDOMINAL WALL ABSCESS;  Surgeon: Jerry Caras Day, MD;  Location: OR [place];  Service: General Surgery  PR LAP, INCISIONAL HERNIA REPAIR,REDUCIBLE N/A 06/02/2018    Procedure: LAPAROSCOPIC ASSISTED PARASTOMAL HERNIA REPAIR AND INCISIONAL HERNIA REPAIR WITH MESH;  Surgeon: Jerry Caras Day, MD;  Location: OR [place];  Service: General Surgery    PR VITRECTOMY,MECHANICAL Right 03/28/2020    Procedure: trans pars planar vitrectomy, right eye   ;  Surgeon: Emi Holes, MD;  Location: [facility] OR [place];  Service: Ophthalmology    PR XCAPSL CTRC RMVL INSJ IO LENS PROSTH W/O ECP Right 01/13/2019    Procedure: PHACOEMULSIFICATION OF CATARACT WITH INSERTION OF INTRAOCULAR LENS/TORIC LENS/ORA;  Surgeon: Juanna Cao, MD;  Location: OR [facility];  Service: Ophthalmology    PR XCAPSL CTRC RMVL INSJ IO LENS PROSTH W/O ECP Left 01/27/2019    Procedure: PHACOEMULSIFICATION OF CATARACT WITH INSERTION OF INTRAOCULAR LENS/TORIC LENS/ORA;  Surgeon: Juanna Cao, MD;  Location: OR [facility];  Service: Ophthalmology    RESECTION SMALL BOWEL / CLOSURE ILEOSTOMY  1981    temporary ileostomy     REVISION COLOSTOMY  2009    SMALL INTESTINE SURGERY  3x since 1984    Crohn???s disease URETERAL STENT PLACEMENT  2008       Family History:     Family History   Problem Relation Age of Onset    Heart disease Mother         pacemaker    Arthritis Mother     Dementia Father     Cancer Father        Social History:     Social History     Socioeconomic History    Marital status: Married    Number of children: 2    Highest education level: Master's degree (e.g., MA, MS, MEng, MEd, MSW, MBA)   Tobacco Use    Smoking status: Former     Current packs/day: 0.00     Average packs/day: 1 pack/day for 20.0 years (20.0 ttl pk-yrs)     Types: Cigarettes     Start date: 12/09/1964     Quit date: 12/09/1984     Years since quitting: 38.3    Smokeless tobacco: Never   Vaping Use    Vaping status: Never Used   Substance and Sexual Activity    Alcohol use: Yes     Comment: occassionally    Drug use: No    Sexual activity: Yes     Partners: Female     Birth control/protection: None     Social Determinants of Health     Financial Resource Strain: Low Risk  (11/18/2022)    Overall Financial Resource Strain (CARDIA)     Difficulty of Paying Living Expenses: Not hard at all   Food Insecurity: No Food Insecurity (11/18/2022)    Hunger Vital Sign     Worried About Running Out of Food in the Last Year: Never true     Ran Out of Food in the Last Year: Never true   Transportation Needs: No Transportation Needs (11/18/2022)    PRAPARE - Therapist, art (Medical): No     Lack of Transportation (Non-Medical): No   Physical Activity: Inactive (04/19/2021)    Exercise Vital Sign     Days of Exercise per Week: 0 days     Minutes of Exercise per Session: 0 min   Stress: No Stress Concern Present (04/19/2021)    [employer] of Occupational Health - Occupational Stress Questionnaire     Feeling of Stress : Not at all   Social  Connections: Socially Integrated (04/19/2021)    Social Connection and Isolation Panel [NHANES]     Frequency of Communication with Friends and Family: More than three times a week Frequency of Social Gatherings with Friends and Family: More than three times a week     Attends Religious Services: More than 4 times per year     Active Member of [employer] or Organizations: Yes     Attends Engineer, structural: More than 4 times per year     Marital Status: Married       Allergies:     Patient has no known allergies.    Current Medications:     Current Outpatient Medications   Medication Sig Dispense Refill    acetaminophen (TYLENOL) 500 MG tablet Take 2 tablets (1,000 mg total) by mouth once as needed for pain.      Bacillus coagulans/inulin (PROBIOTIC WITH PREBIOTIC ORAL) Take 2 capsules by mouth daily.      blood sugar diagnostic (GLUCOSE BLOOD) Strp Check blood sugar as directed once a day and for symptoms of high or low blood sugar. 100 strip 0    blood-glucose meter kit Use as instructed 1 each 0    cholecalciferol, vitamin D3-25 mcg, 1,000 unit,, 25 mcg (1,000 unit) capsule Take 1 capsule (25 mcg total) by mouth daily.      doxepin (SINEQUAN) 10 mg/mL solution 0.29ml by mouth up to 1 hour before bedtime. 120 mL 12    EPINEPHrine (EPIPEN) 0.3 mg/0.3 mL injection Inject 0.3 mL (0.3 mg total) under the skin once.      furosemide (LASIX) 20 MG tablet Take 1 tablet (20 mg total) by mouth daily as needed. 90 tablet 1    guaiFENesin (MUCINEX) 1,200 mg Ta12 Take 1 tablet by mouth every twelve (12) hours as needed.      guaiFENesin (ROBITUSSIN) 100 mg/5 mL syrup Take 10 mL (200 mg total) by mouth every six (6) hours as needed for congestion or cough for up to 7 days. 180 mL 0    lancets (FREESTYLE) 28 gauge Misc Check blood sugar as directed once a day and for symptoms of high or low blood sugar. 100 each 11    loperamide (IMODIUM) 2 mg capsule Take 1 capsule (2 mg total) by mouth two (2) times a day. You may increase up to 2 capsules four times a day to keep ostomy output around 1 L/day 60 capsule 2    loratadine (CLARITIN) 10 mg tablet Take 1 tablet (10 mg total) by mouth daily.      morphine 20 mg/mL concentrated solution Take 0.5 mL (10 mg total) by mouth every four (4) hours as needed for pain. 30 mL 0    multivitamin (TAB-A-VITE/THERAGRAN) per tablet Take 1 tablet by mouth daily.      ondansetron (ZOFRAN) 8 MG tablet Take 1 tablet (8 mg total) by mouth every twelve (12) hours as needed for nausea. 180 tablet 3    pantoprazole (PROTONIX) 40 MG tablet Take 1 tablet (40 mg total) by mouth daily. 90 tablet 3    rifAXIMin (XIFAXAN) 550 mg Tab Take 1 tablet (550 mg total) by mouth two (2) times a day. 180 tablet 3    spironolactone (ALDACTONE) 50 MG tablet Take 1 tablet (50 mg total) by mouth daily. 30 tablet 5    syringe, disposable, 1 mL Syrg To be used with doxepin oral solution. 25 each 0    ustekinumab (STELARA) 45 mg/0.5 mL Soln 90mg  subcutaneous  every 8weeks (Patient taking differently: Inject 90 mg under the skin. 90mg  subcutaneous every 8weeks) 1 mL 5    vit C/E/Zn/coppr/lutein/zeaxan (PRESERVISION AREDS 2 ORAL) Take 1 tablet by mouth two (2) times a day.      vitamin E acetate (VITAMIN E ORAL) Take 400 Units by mouth two (2) times a day.       No current facility-administered medications for this visit.       ROS   ROS negative unless otherwise noted in HPI.    Objective:   There were no vitals filed for this visit.    GEN: No acute distress.   SKIN: Color is normal. No rashes.  PSYCH: Appropriate affect, normal mood  RESP: Normal work of breathing, no retractions      Assessment and Plan:     Problem List Items Addressed This Visit          Unprioritized    Crohn's disease (CMS-HCC) - Primary (Chronic)     Can use morphine prn  Discussed the new prescription noted above, including potential side effects, drug interactions, instructions for taking the medication, and the consequences of not taking it.  Patient verbalized an understanding of these instructions and had not further questions.      Requested Prescriptions     Signed Prescriptions Disp Refills    morphine 20 mg/mL concentrated solution 30 mL 0     Sig: Take 0.5 mL (10 mg total) by mouth every four (4) hours as needed for pain.    pantoprazole (PROTONIX) 40 MG tablet 90 tablet 3     Sig: Take 1 tablet (40 mg total) by mouth daily.           RETURN TO CARE:  No follow-ups on file.    Visit conducted via Video    Total time spent with patient: 20 minutes

## 2023-03-31 ENCOUNTER — Ambulatory Visit (INDEPENDENT_AMBULATORY_CARE_PROVIDER_SITE_OTHER): Payer: PRIVATE HEALTH INSURANCE

## 2023-03-31 DIAGNOSIS — K7581 Nonalcoholic steatohepatitis (NASH): Principal | ICD-10-CM

## 2023-03-31 DIAGNOSIS — K7460 Unspecified cirrhosis of liver: Principal | ICD-10-CM

## 2023-03-31 DIAGNOSIS — R188 Other ascites: Principal | ICD-10-CM

## 2023-03-31 DIAGNOSIS — C220 Liver cell carcinoma: Principal | ICD-10-CM

## 2023-03-31 DIAGNOSIS — Z0000 Encounter for general adult medical examination without abnormal findings: Secondary | ICD-10-CM

## 2023-04-03 DIAGNOSIS — G9340 Encephalopathy, unspecified: Principal | ICD-10-CM

## 2023-04-03 MED ORDER — RIFAXIMIN 550 MG TABLET
ORAL_TABLET | Freq: Two times a day (BID) | ORAL | 3 refills | 90 days
Start: 2023-04-03 — End: 2024-03-28

## 2023-04-03 NOTE — Unmapped (Signed)
No problem from my standpoint?  Wonder if they are mixed up about allergies?     Spoke with Victorino Dike and advised patient should be okay to take.    Waunita Schooner, RN

## 2023-04-03 NOTE — Unmapped (Signed)
Jennifer from Dr. Philis Pique office wanted to know if it was okay for them to get clearance on a medication for tooth abscess and if they can prescribe it to this patient. For cepholosprin. Their number is [phone] and fax is [phone]

## 2023-04-04 MED ORDER — RIFAXIMIN 550 MG TABLET
ORAL_TABLET | Freq: Two times a day (BID) | ORAL | 3 refills | 90 days | Status: CP
Start: 2023-04-04 — End: 2024-03-29

## 2023-04-04 NOTE — Unmapped (Signed)
Rx refill request for Rifaximin.    LCV 12/30/2022 with labs.     Will refill this medication.

## 2023-04-05 NOTE — Unmapped (Signed)
REASON FOR VISIT:  Crohn's disease    HISTORY OF PRESENT ILLNESS:  Since last visit,  12/30/2022, BMP normal except potassium 4.9, chloride 114, BUN 25  01/20/2023, cirrhotic liver.  Small volume ascites.  Trace pleural effusion.  Stable LR-5 lesion in segment 5.  Mild increase in LR-3 lesion in segment 7 (1.2cm).  Recommend follow-up in 3-6 months.  Stable 7 mm pancreatic tail cyst.  1.4 cm mesenteric nodule in left abdomen.  Indeterminate.    Patient feels well--much better. Rifaximin has helped clear his thinking. If he misses a dose, he notices significant worsening. Stable BLE edema. No worse. No abdominal swelling. Takes loperamide 2mg  po every other day. Empties ostomy bag 3-4 x per day. A lot of gas in the bag. No blood in ostomy output. Cutaneous fistulae drains intermittently. Handles it with a simple gauze pad as needed (changes it once every 5-7 day). Eating well. No n/v as long as he takes the rifaximin. Takes ondansetron as needed.    MEDICATIONS:  has a current medication list which includes the following prescription(s): cholecalciferol (vitamin d3-25 mcg (1,000 unit)), loperamide, magnesium oxide, multivitamin, pantoprazole, rifaximin, spironolactone, stelara, vit c/e/zn/coppr/lutein/zeaxan, glucose blood, blood-glucose meter, epinephrine, lancets, and syringe (disposable).    ALLERGIES:  Allergies as of 04/07/2023    (No Known Allergies)       PAST MEDICAL HISTORY:  1. ileocolonic Crohn's disease with perianal fistula.  Symptoms started in 1960s.  Diagnosed 1973.  Sulfasalazine and decades of prednisone.  1983.  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  Ileostomy reversal.  1995, left hemicolectomy with end colostomy and Hartman's pouch.  Tapered off prednisone in mid 90s.  Started infliximab followed by certolizumab both which had primary nonresponse.  Started adalimumab which worked well for 10 years.  Steroid free remission.  2009, recurrent SBO treated conservatively and with prednisone.  Required several small bowel resections during this time.  2018, new onset heart failure (EF 30%) thought to be due to anti-TNF.  Stopped adalimumab with improvement in ejection fraction.  Asymptomatic off therapy x3 years.  2019, parastomal hernia repair and ventral incisional hernia repair with revision of stoma and biodegradable mesh placement.  Postop abscess that resolved.  05/2020, SBO probably due to adhesions treated with lysis of adhesions.  Poor wound healing postoperatively.  06/2020, start ustekinumab prophylactically to help maintain remission even though no active Crohn's at time of surgery. 12/28/2020, SBO-probably adhesions or hernia, treated with supportive care. 12/03/2021, admitted for SBO.  Treated with supportive care. 05/04/22, hospitalized for SBO.  Treated with supportive care. 09/13/2022, colonoscopy with fibrotic anastomotic stenosis that was balloon dilated. 10/22/2022, enterocutaneous fistula drainage started. 10/23/2022 - 11/07/2022, hospitalized to start TPN.  Complicated by volume overload treated with diuretics. 11/15/2022 - 11/22/2022, hospitalized for edema due to volume overload.  Treated with diuretics.  TPN stopped.  Diagnosed with new HCC and liver cirrhosis.  2.  Sleep apnea  3.  Congestive heart failure, associated with anti-TNF (adalimumab). 11/18/2022, TTE.  EF 55%.  Grade 2 diastolic dysfunction.  Moderately dilated left atrium.  Elevated right atrial pressures consistent with fluid overload (?TPN).  4.  Heartburn  5.  Hypertension  6.  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  7.  Kidney stones  8.  Skin cancer  9.  06/2018, PPD negative  10.  03/2017, PPSV23  11.  COVID-19 mRNA vaccines x2   12. Osteoporosis    SOCIAL HISTORY:  Quit smoking.  Married. Retired [name], [employer].    FAMILY HISTORY:  No IBD.  Father had colon cancer in his 62s.    PHYSICAL EXAM:  BP 128/55 (BP Site: R Arm, BP Position: Sitting, BP Cuff Size: Medium)  - Pulse 78  - Temp 37 ??C (98.6 ??F) (Temporal)  - Ht 167.6 cm (5' 6)  - Wt 72.8 kg (160 lb 6.4 oz)  - BMI 25.89 kg/m??   Wt Readings from Last 10 Encounters:   04/07/23 72.8 kg (160 lb 6.4 oz)   02/05/23 68.8 kg (151 lb 9.6 oz)   12/30/22 72.6 kg (160 lb)   11/28/22 71.9 kg (158 lb 9.6 oz)   11/22/22 79.7 kg (175 lb 11.2 oz)   11/11/22 84.4 kg (186 lb)   11/06/22 84 kg (185 lb 3.2 oz)   10/23/22 72.8 kg (160 lb 8 oz)   10/16/22 74.8 kg (164 lb 12.8 oz)   09/13/22 74.8 kg (165 lb)     CONSTITUTIONAL: awake, alert, NAD, appears much healthier than prior.  NEUROLOGIC: oriented x 3, no asterixis  Extremities: 1+ pitting edema in ankles bilaterally  Abdomen: Left mid abdominal ostomy with soft orange stool. Midline surgical incision with no drainage, tenderness, fluctuance, erythema.    TEST DATA:  1.  03/19/2018, colonoscopy.  Patent end sigmoid colostomy characterized by erythema and friable mucosa that was biopsied.  Patent end-to-end ileocolonic anastomosis characterized by edema, erosions, erythema, friability.  Normal ileum.  No obvious evidence for active Crohn's disease.    2.  09/22/2020, CT abdomen pelvis.  Postsurgical changes of abdominal wall.  No fluid collection.  No fistula.  No signs of active IBD.  Hepatic steatosis.  Spine compression deformities.  Aortic atherosclerosis.  3.  10/31/2020, CT enterography.  Left lower quadrant end colostomy with parastomal hernia.  Status post right hemicolectomy with ileocolonic anastomosis.  1.1 cm descending colon lipoma.  No active bowel inflammation.  4. 12/27/2020, CTAP without contrast.  Status post right hemicolectomy and loop colostomy.  Parastomal hernia containing fat and multiple dilated small bowel loops.  No discrete transition point.  Bowel loops adhered to the anterior abdominal wall where surrounding mesenteric stranding is present.  5. 02/28/2021, QDR. L1-4 T=-0.8, Left femoral neck T= -2.8, Right femoral neck T=-3.1  6. 12/03/2021, CTAP with IV contrast only.  Partial SBO with transition point in left upper quadrant.  Likely due to adhesion.  7. 05/04/22, CTAP w IV contrast. Steatosis. 8mm CBD. 3.8cm SB loops with transition point proximal to ileocolonic anastomosis-suspect adhesions. 9.7cm fluid and gas collection in ventral abdomen between SB loops. ? Fistula.  8. 05/07/2022, CTAP w IV/PO contrast. 1cm pancreatic cyst (stable), small peristomal hernia. Resolution of sbo. Colon diverticula. ? 14.6cm fistula-like collection near anterior abdominal wall.  9. 09/13/2022, colonoscopy to TI x 8 cm.  8 mm diameter by 8 mm long noninflamed stenosis at ICA.  Dilated to 12 mm.  No mucosal disruption.  No active inflammation in the ileum or colon.  10. 11/13/2022, CTE.  Resolution of large abdominal wall fluid collection.  Small bowel adhesions to anterior abdominal wall with gas bubbles.  Parastomal hernia containing small bowel.  Wall enhancement and stranding of colon and duodenum.  Enlarging liver lesions.  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  Several 4.5 cm small bowel loops without clear transition point.  Wall thickening and enhancement in portion of small bowel.  Ascites, body wall edema, scrotal hydrocele suggesting fluid overload.  11. 11/17/2022, MRI abdomen.  1.5 cm liver lesion in segment 5.  LR-5 definite hepatocellular carcinoma.  1.1 cm segment 7 LR-3 indeterminate lesion.  9 mm pancreatic tail cyst.  Diffuse wall edema of stomach and colon.  12. 11/21/2022, HBsAb, HBctotal, HBsAg all neg. HCV Ab pos. HCV RNA not detected.  13. 12/17/2022, CTAP.  4.4 cm dilated small bowel without transition point.  Small bowel adhesions.  Rectal stump tethered to anterior abdominal wall.  Increased size of fluid collection in anterior abdominal wall with new locules of gas.  Parastomal hernia containing small bowel.  1.4 cm liver nodule in segment 5.  Pancreatic tail cyst.  Nonobstructive kidney stones (1.5-1.8 cm)  14. 01/20/2023, cirrhotic liver.  Small volume ascites.  Trace pleural effusion.  Stable LR-5 lesion in segment 5.  Mild increase in LR-3 lesion in segment 7 (1.2cm).  Recommend follow-up in 3-6 months.  Stable 7 mm pancreatic tail cyst.  1.4 cm mesenteric nodule in left abdomen.  Indeterminate.    ASSESSMENT:  1.  Ileocolonic Crohn's disease with history of perianal fistula.  Status post right hemicolectomy with ileocolonic anastomosis and left-sided end colostomy.  Recurrent hospitalizations for small bowel obstruction.  Probably due to adhesions or fibrotic stenosis at the anastomosis.  However, cannot completely rule out proximal small bowel Crohn's disease though I think this is less likely.  No active inflammation within reach of the scope.  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  Currently inactive.  Not a surgical candidate at this time due to [facility], cirrhosis.  Currently in symptomatic remission on ustekinumab.  Continue ustekinumab every 8 weeks as before.  Reviewed risks including risks of infection.  Could consider repeat colonoscopy with balloon dilation of the anastomotic stricture as needed.  2.  Reported diagnosis of congestive heart failure.  Last echocardiogram shows only mild diastolic dysfunction.  Preserved LVEF of greater than 55%.  3. MASLD cirrhosis with HCC and encephalopathy.  No signs of encephalopathy or ascites today.  Does have peripheral lower extremity edema which is chronic problem for him.  Check blood work today.  Continue rifaximin, spironolactone 50 mg daily and follow-up with Dr. Octaviano Glow to determine if functional status has improved to the point where [facility] treatment might be feasible.   4.  Recent root canal today.  Concerned about pain after local anesthetic wears off.  Okay to take acetaminophen maximum of 2 g per 24-hour.  As needed.    RECOMMENDATIONS AND PLAN:  Patient Instructions   -Continue Stelara every 2 months as before  -Continue rifaximin to prevent encephalopathy. I will send in refill to your local pharmacy to bridge you until your mailed supply arrives.  -OK to take maximum of 2000mg  of acetaminophen per 24 hour period as needed for tooth pain.  -Follow up with Dr. Octaviano Glow regarding liver lesions.  -Bloodwork today to check liver, electrolytes, blood counts  -Clinic visit with me in 6 months or sooner if needed.

## 2023-04-07 ENCOUNTER — Ambulatory Visit: Admit: 2023-04-07 | Discharge: 2023-04-08 | Payer: MEDICARE | Attending: Gastroenterology | Primary: Gastroenterology

## 2023-04-07 DIAGNOSIS — K7682 Hepatic encephalopathy (CMS-HCC): Principal | ICD-10-CM

## 2023-04-07 DIAGNOSIS — E1122 Type 2 diabetes mellitus with diabetic chronic kidney disease: Principal | ICD-10-CM

## 2023-04-07 DIAGNOSIS — K50013 Crohn's disease of small intestine with fistula: Principal | ICD-10-CM

## 2023-04-07 DIAGNOSIS — N183 Type 2 diabetes mellitus with stage 3 chronic kidney disease, without long-term current use of insulin, unspecified whether stage 3a or 3b CKD (CMS-HCC): Principal | ICD-10-CM

## 2023-04-07 LAB — COMPREHENSIVE METABOLIC PANEL
ALBUMIN: 2.8 g/dL — ABNORMAL LOW (ref 3.4–5.0)
ALKALINE PHOSPHATASE: 189 U/L — ABNORMAL HIGH (ref 46–116)
ALT (SGPT): 48 U/L (ref 10–49)
ANION GAP: 5 mmol/L (ref 5–14)
AST (SGOT): 56 U/L — ABNORMAL HIGH (ref <=34)
BILIRUBIN TOTAL: 0.5 mg/dL (ref 0.3–1.2)
BLOOD UREA NITROGEN: 29 mg/dL — ABNORMAL HIGH (ref 9–23)
BUN / CREAT RATIO: 29
CALCIUM: 8.8 mg/dL (ref 8.7–10.4)
CHLORIDE: 113 mmol/L — ABNORMAL HIGH (ref 98–107)
CO2: 21.9 mmol/L (ref 20.0–31.0)
CREATININE: 1.01 mg/dL
EGFR CKD-EPI (2021) MALE: 75 mL/min/{1.73_m2} (ref >=60)
GLUCOSE RANDOM: 144 mg/dL (ref 70–179)
POTASSIUM: 4.5 mmol/L (ref 3.4–4.8)
PROTEIN TOTAL: 7.4 g/dL (ref 5.7–8.2)
SODIUM: 140 mmol/L (ref 135–145)

## 2023-04-07 LAB — CBC
HEMATOCRIT: 36.1 % — ABNORMAL LOW (ref 39.0–48.0)
HEMOGLOBIN: 12.5 g/dL — ABNORMAL LOW (ref 12.9–16.5)
MEAN CORPUSCULAR HEMOGLOBIN CONC: 34.5 g/dL (ref 32.0–36.0)
MEAN CORPUSCULAR HEMOGLOBIN: 32.9 pg — ABNORMAL HIGH (ref 25.9–32.4)
MEAN CORPUSCULAR VOLUME: 95.2 fL (ref 77.6–95.7)
MEAN PLATELET VOLUME: 9.2 fL (ref 6.8–10.7)
PLATELET COUNT: 148 10*9/L — ABNORMAL LOW (ref 150–450)
RED BLOOD CELL COUNT: 3.79 10*12/L — ABNORMAL LOW (ref 4.26–5.60)
RED CELL DISTRIBUTION WIDTH: 15.4 % — ABNORMAL HIGH (ref 12.2–15.2)
WBC ADJUSTED: 7.3 10*9/L (ref 3.6–11.2)

## 2023-04-07 LAB — PROTIME-INR
INR: 1.13
PROTIME: 12.6 s (ref 9.9–12.6)

## 2023-04-07 MED ORDER — RIFAXIMIN 550 MG TABLET
ORAL_TABLET | Freq: Two times a day (BID) | ORAL | 0 refills | 4 days | Status: CP
Start: 2023-04-07 — End: 2023-04-11

## 2023-04-07 NOTE — Unmapped (Addendum)
-  Continue Stelara every 2 months as before  -Continue rifaximin to prevent encephalopathy. I will send in refill to your local pharmacy to bridge you until your mailed supply arrives.  -OK to take maximum of 2000mg  of acetaminophen per 24 hour period as needed for tooth pain.  -Follow up with Dr. Octaviano Glow regarding liver lesions.  -Bloodwork today to check liver, electrolytes, blood counts  -Clinic visit with me in 6 months or sooner if needed.

## 2023-04-08 NOTE — Unmapped (Signed)
Tobi Bastos from [facility] calling because patient wife wanted her to call and check on the status of a medication she requested for her husband. The medication is temazepam 15 mg nightly to improve sleep. Tobi Bastos states that their doctor had recommended this medication for the patient.    Tobi Bastos callback number is [phone]

## 2023-04-09 MED ORDER — TEMAZEPAM 7.5 MG CAPSULE
ORAL_CAPSULE | Freq: Every evening | ORAL | 1 refills | 30 days | Status: CP | PRN
Start: 2023-04-09 — End: ?

## 2023-04-10 NOTE — Unmapped (Signed)
LM for Thomas Rosales. Temazepam has been sent to the pharmacy.

## 2023-04-12 ENCOUNTER — Ambulatory Visit: Admit: 2023-04-12 | Payer: MEDICARE

## 2023-04-12 ENCOUNTER — Ambulatory Visit: Admit: 2023-04-12 | Discharge: 2023-04-20 | Disposition: A | Discharge status: Home Health | Payer: MEDICARE

## 2023-04-12 LAB — URINALYSIS WITH MICROSCOPY WITH CULTURE REFLEX
BACTERIA: NONE SEEN /HPF
BILIRUBIN UA: NEGATIVE
GLUCOSE UA: NEGATIVE
KETONES UA: NEGATIVE
LEUKOCYTE ESTERASE UA: NEGATIVE
NITRITE UA: NEGATIVE
PH UA: 6 (ref 5.0–9.0)
RBC UA: 11 /HPF — ABNORMAL HIGH (ref <=3)
SPECIFIC GRAVITY UA: 1.2 — ABNORMAL HIGH (ref 1.003–1.030)
SQUAMOUS EPITHELIAL: 1 /HPF (ref 0–5)
UROBILINOGEN UA: <2
WBC UA: 6 /HPF — ABNORMAL HIGH (ref <=2)

## 2023-04-12 LAB — CBC W/ AUTO DIFF
BASOPHILS ABSOLUTE COUNT: 0.1 10*9/L (ref 0.0–0.1)
BASOPHILS RELATIVE PERCENT: 0.9 %
EOSINOPHILS ABSOLUTE COUNT: 0.1 10*9/L (ref 0.0–0.5)
EOSINOPHILS RELATIVE PERCENT: 0.7 %
HEMATOCRIT: 41 % (ref 39.0–48.0)
HEMOGLOBIN: 13.8 g/dL (ref 12.9–16.5)
LYMPHOCYTES ABSOLUTE COUNT: 1.1 10*9/L (ref 1.1–3.6)
LYMPHOCYTES RELATIVE PERCENT: 7.5 %
MEAN CORPUSCULAR HEMOGLOBIN CONC: 33.6 g/dL (ref 32.0–36.0)
MEAN CORPUSCULAR HEMOGLOBIN: 31.6 pg (ref 25.9–32.4)
MEAN CORPUSCULAR VOLUME: 94.1 fL (ref 77.6–95.7)
MEAN PLATELET VOLUME: 9.8 fL (ref 6.8–10.7)
MONOCYTES ABSOLUTE COUNT: 1.5 10*9/L — ABNORMAL HIGH (ref 0.3–0.8)
MONOCYTES RELATIVE PERCENT: 10.7 %
NEUTROPHILS ABSOLUTE COUNT: 11.6 10*9/L — ABNORMAL HIGH (ref 1.8–7.8)
NEUTROPHILS RELATIVE PERCENT: 80.2 %
NUCLEATED RED BLOOD CELLS: 0 /100{WBCs} (ref <=4)
PLATELET COUNT: 212 10*9/L (ref 150–450)
RED BLOOD CELL COUNT: 4.36 10*12/L (ref 4.26–5.60)
RED CELL DISTRIBUTION WIDTH: 15.3 % — ABNORMAL HIGH (ref 12.2–15.2)
WBC ADJUSTED: 14.4 10*9/L — ABNORMAL HIGH (ref 3.6–11.2)

## 2023-04-12 LAB — COMPREHENSIVE METABOLIC PANEL
ALBUMIN: 3.3 g/dL — ABNORMAL LOW (ref 3.4–5.0)
ALKALINE PHOSPHATASE: 215 U/L — ABNORMAL HIGH (ref 46–116)
ALT (SGPT): 54 U/L — ABNORMAL HIGH (ref 10–49)
ANION GAP: 7 mmol/L (ref 5–14)
AST (SGOT): 64 U/L — ABNORMAL HIGH (ref <=34)
BILIRUBIN TOTAL: 0.9 mg/dL (ref 0.3–1.2)
BLOOD UREA NITROGEN: 28 mg/dL — ABNORMAL HIGH (ref 9–23)
BUN / CREAT RATIO: 20
CALCIUM: 9.5 mg/dL (ref 8.7–10.4)
CHLORIDE: 110 mmol/L — ABNORMAL HIGH (ref 98–107)
CO2: 23.6 mmol/L (ref 20.0–31.0)
CREATININE: 1.37 mg/dL — ABNORMAL HIGH
EGFR CKD-EPI (2021) MALE: 52 mL/min/{1.73_m2} — ABNORMAL LOW (ref >=60)
GLUCOSE RANDOM: 119 mg/dL (ref 70–179)
POTASSIUM: 4.5 mmol/L (ref 3.4–4.8)
PROTEIN TOTAL: 8.7 g/dL — ABNORMAL HIGH (ref 5.7–8.2)
SODIUM: 141 mmol/L (ref 135–145)

## 2023-04-12 LAB — LACTATE, VENOUS, WHOLE BLOOD: LACTATE BLOOD VENOUS: 1.2 mmol/L (ref 0.5–1.8)

## 2023-04-12 LAB — LIPASE: LIPASE: 36 U/L (ref 12–53)

## 2023-04-12 MED ADMIN — iohexol (OMNIPAQUE) 350 mg iodine/mL solution 100 mL: 100 mL | INTRAVENOUS | @ 22:00:00 | Stop: 2023-04-12

## 2023-04-12 MED ADMIN — lactated ringers bolus 1,000 mL: 1000 mL | INTRAVENOUS | @ 23:00:00 | Stop: 2023-04-12

## 2023-04-12 MED ADMIN — morphine 4 mg/mL injection 4 mg: 4 mg | INTRAVENOUS | @ 22:00:00 | Stop: 2023-04-12

## 2023-04-12 MED ADMIN — ondansetron (ZOFRAN) injection 4 mg: 4 mg | INTRAVENOUS | @ 23:00:00 | Stop: 2023-04-12

## 2023-04-12 NOTE — Unmapped (Signed)
Presents with no output from Ostomy since 2200 last night and generalized abd pain. States that symptoms are similar to when he had a bowel obstruction in the past.

## 2023-04-13 LAB — HEPATIC FUNCTION PANEL
ALBUMIN: 2.9 g/dL — ABNORMAL LOW (ref 3.4–5.0)
ALKALINE PHOSPHATASE: 163 U/L — ABNORMAL HIGH (ref 46–116)
ALT (SGPT): 54 U/L — ABNORMAL HIGH (ref 10–49)
AST (SGOT): 61 U/L — ABNORMAL HIGH (ref <=34)
BILIRUBIN DIRECT: 0.7 mg/dL — ABNORMAL HIGH (ref 0.00–0.30)
BILIRUBIN TOTAL: 1.4 mg/dL — ABNORMAL HIGH (ref 0.3–1.2)
PROTEIN TOTAL: 7.5 g/dL (ref 5.7–8.2)

## 2023-04-13 LAB — CBC
HEMATOCRIT: 38.3 % — ABNORMAL LOW (ref 39.0–48.0)
HEMOGLOBIN: 13.1 g/dL (ref 12.9–16.5)
MEAN CORPUSCULAR HEMOGLOBIN CONC: 34.2 g/dL (ref 32.0–36.0)
MEAN CORPUSCULAR HEMOGLOBIN: 32 pg (ref 25.9–32.4)
MEAN CORPUSCULAR VOLUME: 93.4 fL (ref 77.6–95.7)
MEAN PLATELET VOLUME: 9 fL (ref 6.8–10.7)
PLATELET COUNT: 185 10*9/L (ref 150–450)
RED BLOOD CELL COUNT: 4.1 10*12/L — ABNORMAL LOW (ref 4.26–5.60)
RED CELL DISTRIBUTION WIDTH: 15 % (ref 12.2–15.2)
WBC ADJUSTED: 14.1 10*9/L — ABNORMAL HIGH (ref 3.6–11.2)

## 2023-04-13 LAB — BASIC METABOLIC PANEL
ANION GAP: 8 mmol/L (ref 5–14)
BLOOD UREA NITROGEN: 22 mg/dL (ref 9–23)
BUN / CREAT RATIO: 18
CALCIUM: 8.9 mg/dL (ref 8.7–10.4)
CHLORIDE: 109 mmol/L — ABNORMAL HIGH (ref 98–107)
CO2: 21 mmol/L (ref 20.0–31.0)
CREATININE: 1.24 mg/dL — ABNORMAL HIGH
EGFR CKD-EPI (2021) MALE: 59 mL/min/{1.73_m2} — ABNORMAL LOW (ref >=60)
GLUCOSE RANDOM: 112 mg/dL (ref 70–179)
POTASSIUM: 5 mmol/L — ABNORMAL HIGH (ref 3.4–4.8)
SODIUM: 138 mmol/L (ref 135–145)

## 2023-04-13 LAB — PHOSPHORUS: PHOSPHORUS: 3.6 mg/dL (ref 2.4–5.1)

## 2023-04-13 LAB — MAGNESIUM: MAGNESIUM: 1.7 mg/dL (ref 1.6–2.6)

## 2023-04-13 MED ADMIN — rifAXIMin (XIFAXAN) tablet 550 mg: 550 mg | ORAL | @ 13:00:00 | Stop: 2023-04-17

## 2023-04-13 MED ADMIN — albumin human 25 % bottle 25 g: 25 g | INTRAVENOUS | @ 16:00:00

## 2023-04-13 MED ADMIN — morphine injection 2 mg: 2 mg | INTRAVENOUS | @ 21:00:00 | Stop: 2023-04-27

## 2023-04-13 MED ADMIN — acetaminophen (OFIRMEV) 10 mg/mL injection 650 mg 65 mL: 650 mg | INTRAVENOUS | @ 11:00:00 | Stop: 2023-04-13

## 2023-04-13 MED ADMIN — acetaminophen (OFIRMEV) 10 mg/mL injection 650 mg 65 mL: 650 mg | INTRAVENOUS | @ 17:00:00 | Stop: 2023-04-13

## 2023-04-13 MED ADMIN — enoxaparin (LOVENOX) syringe 40 mg: 40 mg | SUBCUTANEOUS | @ 13:00:00

## 2023-04-13 MED ADMIN — rifAXIMin (XIFAXAN) tablet 550 mg: 550 mg | ORAL | @ 04:00:00 | Stop: 2023-04-17

## 2023-04-13 MED ADMIN — albumin human 25 % bottle 25 g: 25 g | INTRAVENOUS | @ 21:00:00

## 2023-04-13 MED ADMIN — morphine 4 mg/mL injection 4 mg: 4 mg | INTRAVENOUS | @ 05:00:00 | Stop: 2023-04-13

## 2023-04-13 MED ADMIN — morphine 4 mg/mL injection 4 mg: 4 mg | INTRAVENOUS | @ 03:00:00 | Stop: 2023-04-12

## 2023-04-13 MED ADMIN — lactated Ringers infusion: 50 mL/h | INTRAVENOUS | @ 11:00:00

## 2023-04-13 NOTE — Unmapped (Signed)
Pt AO x4, VSS, NAD   Pt c/o 2/10 pain,--describes as discomfort more than pain  Admitted to floor at ~0430  Pt transferred to bed from stretcher independently, with use of cane   NGT L nare to LIWS--200 mL output in canister. Yellow in tube  Ostomy bag with no output. Site c/d/i   Umbilical fistual closed, dressed with gauze and tape.   No falls/injuries this shift     Problem: Adult Inpatient Plan of Care  Goal: Plan of Care Review  Outcome: Ongoing - Unchanged  Goal: Patient-Specific Goal (Individualized)  Outcome: Ongoing - Unchanged  Goal: Absence of Hospital-Acquired Illness or Injury  Outcome: Ongoing - Unchanged  Intervention: Identify and Manage Fall Risk  Recent Flowsheet Documentation  Taken 04/13/2023 0440 by Raechel Ache, RN  Safety Interventions:   fall reduction program maintained   low bed   nonskid shoes/slippers when out of bed  Intervention: Prevent Skin Injury  Recent Flowsheet Documentation  Taken 04/13/2023 0440 by Raechel Ache, RN  Positioning for Skin: Supine/Back  Device Skin Pressure Protection: absorbent pad utilized/changed  Skin Protection: adhesive use limited  Intervention: Prevent and Manage VTE (Venous Thromboembolism) Risk  Recent Flowsheet Documentation  Taken 04/13/2023 0440 by Raechel Ache, RN  Anti-Embolism Device Type: SCD, Knee  Anti-Embolism Intervention: Refused  Goal: Optimal Comfort and Wellbeing  Outcome: Ongoing - Unchanged  Goal: Readiness for Transition of Care  Outcome: Ongoing - Unchanged  Goal: Rounds/Family Conference  Outcome: Ongoing - Unchanged

## 2023-04-13 NOTE — Unmapped (Signed)
Hepatology Consult Service   Initial Consultation         Assessment and Recommendations:   Thomas Rosales is a 82 y.o. male with a PMHx of decompensated MASLD cirrhosis c/b edema, HE, and HCC, ileocolonic Crohn's disease s/p L hemicolectomy with end colostomy and multiple SBRs from SBOs currently on ustekinumab, HFpEF, HTN, GERD who presented to [facility] with a SBO. The patient is seen in consultation at the request of Clovis Cao, MD ([facility])) for decompensated cirrhosis.    Decompensated MASLD Cirrhosis, Mild AKI: Thomas Rosales appears to be near his baseline in regards to his liver disease. He has a slight acute kidney injury and we recommend albumin supplementation to help with that along with less mIVF which will tend to third-space in patient's with cirrhosis. Continue HE medication. 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    -- Reduce mIVF to 31mL/hr (patient will tend to third-space IVF)  -- Start albumin 25% 25g BID x2 days (support renal function)  -- Hold diuretics in setting of NPO status  -- Continue rifaximin 550mg  BID  -- Limit acetaminophen use to </=2g/day    VOCAL-[facility] Scores: Operative risk in patients with cirrhosis. This is for planned surgery, numbers would be higher for emergency sugery. Lap Abdominal: 4.9% 30-day mortality, 20.6% 180-day mortality, 23.4% post-op 90-day decompensation. Open Abdominal: 19.7% 30-day mortality, 52.3% 90-day mortality, 36% post-op 90-day decompensation    Issues Impacting Complexity of Management:  -None    Recommendations discussed with the patient's primary team. We will continue to follow along with you.    Subjective:   The patient reports that his liver is not giving him any trouble currently, it is just his bowels. He reports he has been on low-dose diuretics for edema at home as well as rifaximin for his mental clarity. The diuretics have been working well and the rifaximin is a wonder drug in terms of keeping his thought processes clear. His daughter remarks that you can notice within hours if he has missed a dose of rifaximin. He came to the hospital due to abdominal pain and no ostomy output since Friday. His belly hurts and is distended. Bilious to almost feculent NG output.     Known LR-5 lesion. Working on nutritional and strength per Dr. Christella Scheuermann last clinic note in order to be considered for therapy to this [facility].     -I have reviewed the patient's prior records from Dr. Christella [facility] visit as summarized in the HPI    Objective:   Temp:  [36.5 ??C (97.7 ??F)-36.7 ??C (98.1 ??F)] 36.5 ??C (97.7 ??F)  Heart Rate:  [73-86] 76  SpO2 Pulse:  [77] 77  Resp:  [13-22] 18  BP: (132-151)/(62-78) 133/64  SpO2:  [93 %-100 %] 96 %    Gen: Chronically ill-appearing male in NAD, answers questions appropriately  Eyes: Sclera anicteric  Abdomen: Distended, tender abdomen  Extremities: No edema in the BLEs  Neuro: Normal speech. No asterixis.   Psych: Alert, normal mood and affect.     Pertinent Labs/Studies:  -I have visualized the patient's CT dated 5/4 which shows multiple dilated loops of bowel, no appreciable ascites  -I have reviewed the patient's labs from 5/5 which show stable Hgb, improving renal function (SCr), and stable LFTs

## 2023-04-13 NOTE — Unmapped (Signed)
[facility]  Emergency Department Provider Note      ED Clinical Impression     Final diagnoses:   Small bowel obstruction (CMS-HCC) (Primary)       Initial Impression, ED Course, Assessment and Plan     Time seen: Apr 12, 2023 5:34 PM   Thomas Rosales is a 82 y.o. male presenting with acute onset of abdominal pain last night at 10 PM with decreased output from his ostomy which is similar to his prior SBOs.     Impression:     Plan for CT A/P with contrast, Lactate, UA with Microscopy, and basic labs. Will give morphine, IV fluids, and place NG tube given concerning findings.       6:39 PM Radiology called stating that patient's CT was concerning for an impending SBO. Spoke with Dr. Cedric Fishman about patient's CT findings. She recommend that patient be transferred to main campus to be evaluated by Surgery. I will go through [facility] to consult lower GI surgery for recommendations.     7:19 PM Spoke with Dr. Clovis Cao with Lower GI surgery who will admit the patient to their services.     ED Course as of 04/12/23 2250   Sat Apr 12, 2023   1857 I have attempted to contact lower GI through [facility] awaiting callback.   1920 Patient discussed with Dr. Lucretia Roers, lower GI attending, he agrees to admit patient to his service.  I have placed the ADT 1.   2108 Family is concerned about going to [facility] for multiple reasons.  At this point they are refusing to be transferred to [facility].  They would like to be admitted to [facility] for observation and transferred only if absolutely needing surgery.  Currently has an NG tube placed and is actually feeling much better.  I will page the [facility] to see if they would be willing to admit the patient and then I will talk with Dr. Lucretia Roers to let him know that the patient does not want to come to [facility].   2249 I did discuss with the patient and family that we had limited resources at [facility], but we do not have emergent GI, and we do not have specialized surgeons and that we have discussed his case with our general surgeon who would not admit the patient given his specialized needs.  However, family is still persistent that they would like the patient admitted, to [facility].         MDM    Social Determinants of Health with Concerns     Internet Connectivity: Not on file   Tobacco Use: Medium Risk (04/07/2023)    Patient History     Smoking Tobacco Use: Former     Smokeless Tobacco Use: Never     Passive Exposure: Not on file   Physical Activity: Inactive (04/19/2021)    Exercise Vital Sign     Days of Exercise per Week: 0 days     Minutes of Exercise per Session: 0 min   Interpersonal Safety: Not on file       ____________________________________________        History     Chief Complaint  Abdominal Pain      HPI   Thomas Rosales is a 82 y.o. 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 The patient reports acute onset of abdominal pain at 10 PM last night followed by decreaed output from his ostomy. He states his presentation is very similar to his recurrent SBO. He induced himself to have an episode of emesis which somewhat improved his pain.     Past Medical History  Past Medical History:   Diagnosis Date    Anal fissure 1983    Apnea, sleep     uses CPAP at night. instructed to bring DOS.     Basal cell carcinoma     Cancer (CMS-HCC)     skin cancer    CHF (congestive heart failure) (CMS-HCC)     Crohn's disease (CMS-HCC)     Diverticulitis of colon     Fatty liver     Gallstones     GERD (gastroesophageal reflux disease)     H/O echocardiogram     Hypertension     Irritable bowel syndrome     Kidney stone     NASH (nonalcoholic steatohepatitis)     Syncope     dizziness    Type 2 diabetes mellitus, without long-term current use of insulin (CMS-HCC) 10/24/2022       Past Surgical History  Past Surgical History:   Procedure Laterality Date    APPENDECTOMY      BOWEL RESECTION  1981, 1984, 1995    3 ft small intestine, <1 ft large intestines, cecum removed; 2nd bowel resection anal fistulas removed; 3rd bowel resection most of colon removed, colostomy placed.    CARDIAC CATHETERIZATION      CARDIOVASCULAR STRESS TEST      CHOLECYSTECTOMY      COLON SURGERY      Same as small bowel surgery    COLOSTOMY  1995    EYE SURGERY  1/20 & 4/21    Cataract & vitrious removal    FRACTURE SURGERY  1961    Broken tibia and fibula    HERNIA REPAIR  2019    Done by Dr Day    LAPAROSCOPIC SMALL BOWEL RESECTION      LEG SURGERY Left     pins in shin    LITHOTRIPSY      PR COLONOSCOPY FLX DX W/COLLJ SPEC WHEN PFRMD  03/19/2018    Procedure: COLONOSCOPY WITH POSSIBLE BIOPSY, POSSIBLE POLYPECTOMY.;  Surgeon: Tressie Ellis, MD;  Location: ENDO OR [facility];  Service: Gastroenterology    PR COLSC FLEXIBLE W/TRANSENDOSCOPIC BALLOON DILAT N/A 09/13/2022    Procedure: COLONOSCOPY, FLEXIBLE; WITH DILATION BY BALLOON, 1 OR MORE STRICTURES;  Surgeon: Luanne Bras, MD;  Location: [facility] GI PROCEDURES [facility];  Service: Gastroenterology    PR INCISION & DRAINAGE ABSCESS SIMPLE/SINGLE N/A 08/11/2018    Procedure: INCISION AND DRAINAGE OF ABDOMINAL WALL ABSCESS;  Surgeon: Jerry Caras Day, MD;  Location: OR [place];  Service: General Surgery    PR LAP, INCISIONAL HERNIA REPAIR,REDUCIBLE N/A 06/02/2018    Procedure: LAPAROSCOPIC ASSISTED PARASTOMAL HERNIA REPAIR AND INCISIONAL HERNIA REPAIR WITH MESH;  Surgeon: Jerry Caras Day, MD;  Location: OR [place];  Service: General Surgery    PR VITRECTOMY,MECHANICAL Right 03/28/2020    Procedure: trans pars planar vitrectomy, right eye   ;  Surgeon: Emi Holes, MD;  Location: [facility] OR [place];  Service: Ophthalmology    PR XCAPSL CTRC RMVL INSJ IO LENS PROSTH W/O ECP Right 01/13/2019    Procedure: PHACOEMULSIFICATION OF CATARACT WITH INSERTION OF INTRAOCULAR LENS/TORIC LENS/ORA;  Surgeon: Juanna Cao, MD;  Location: OR [facility];  Service: Ophthalmology    PR XCAPSL CTRC RMVL INSJ IO LENS PROSTH W/O ECP Left 01/27/2019    Procedure: PHACOEMULSIFICATION OF CATARACT WITH INSERTION OF INTRAOCULAR LENS/TORIC LENS/ORA;  Surgeon: Juanna Cao, MD;  Location: OR [facility];  Service: Ophthalmology    RESECTION SMALL BOWEL / CLOSURE ILEOSTOMY  1981    temporary ileostomy     REVISION COLOSTOMY  2009    SMALL INTESTINE SURGERY  3x since 1984    Crohn???s disease    URETERAL STENT PLACEMENT  2008       Medications  No current facility-administered medications for this encounter.    Current Outpatient Medications:     blood sugar diagnostic (GLUCOSE BLOOD) Strp, Check blood sugar as directed once a day and for symptoms of high or low blood sugar., Disp: 100 strip, Rfl: 0    blood-glucose meter kit, Use as instructed, Disp: 1 each, Rfl: 0    cholecalciferol, vitamin D3-25 mcg, 1,000 unit,, 25 mcg (1,000 unit) capsule, Take 1 capsule (25 mcg total) by mouth daily., Disp: , Rfl:     EPINEPHrine (EPIPEN) 0.3 mg/0.3 mL injection, Inject 0.3 mL (0.3 mg total) under the skin once., Disp: , Rfl:     lancets (FREESTYLE) 28 gauge Misc, Check blood sugar as directed once a day and for symptoms of high or low blood sugar., Disp: 100 each, Rfl: 11    magnesium oxide 250 mg (150 mg elemental) tablet, Take by mouth daily., Disp: , Rfl:     multivitamin (TAB-A-VITE/THERAGRAN) per tablet, Take 1 tablet by mouth daily., Disp: , Rfl:     pantoprazole (PROTONIX) 40 MG tablet, Take 1 tablet (40 mg total) by mouth daily., Disp: 90 tablet, Rfl: 3    rifAXIMin (XIFAXAN) 550 mg Tab, Take 1 tablet (550 mg total) by mouth two (2) times a day., Disp: 180 tablet, Rfl: 3    spironolactone (ALDACTONE) 50 MG tablet, Take 1 tablet (50 mg total) by mouth daily., Disp: 30 tablet, Rfl: 5    syringe, disposable, 1 mL Syrg, To be used with doxepin oral solution., Disp: 25 each, Rfl: 0    temAZEpam (RESTORIL) 7.5 MG capsule, Take 1-2 capsules (7.5-15 mg total) by mouth nightly as needed for sleep., Disp: 60 capsule, Rfl: 1    ustekinumab (STELARA) 45 mg/0.5 mL Soln, 90mg  subcutaneous every 8weeks (Patient taking differently: Inject 90 mg under the skin. 90mg  subcutaneous every 8weeks), Disp: 1 mL, Rfl: 5    vit C/E/Zn/coppr/lutein/zeaxan (PRESERVISION AREDS 2 ORAL), Take 1 tablet by mouth two (2) times a day., Disp: , Rfl:     Allergies  Patient has no known allergies.    Family History  Family History   Problem Relation Age of Onset    Heart disease Mother         pacemaker    Arthritis Mother     Dementia Father     Cancer Father        Social History  Social History     Tobacco Use    Smoking status: Former     Current packs/day: 0.00     Average packs/day: 1 pack/day for 20.0 years (20.0 ttl pk-yrs)     Types: Cigarettes     Start date: 12/09/1964     Quit date: 12/09/1984     Years since quitting: 38.3    Smokeless tobacco: Never   Vaping Use    Vaping status: Never Used   Substance Use Topics  Alcohol use: Not Currently     Comment: occassionally    Drug use: No       Review of Systems:   Pertinent positives and negatives are documented as per the HPI    Physical Exam     VITAL SIGNS:    ED Triage Vitals [04/12/23 1547]   Enc Vitals Group      BP 132/77      Heart Rate 80      SpO2 Pulse       Resp 16      Temp 36.6 ??C (97.8 ??F)      Temp Source Oral      SpO2 100 %      Weight 70.3 kg (155 lb)      Height 1.676 m (5' 6)     Constitutional: Alert and oriented. Well appearing and in no distress.  Eyes: Conjunctivae are normal.   ENT       Head: Normocephalic and atraumatic.       Nose: No congestion.       Mouth/Throat: Mucous membranes are moist.       Neck: No stridor.  Cardiovascular: Normal rate, regular rhythm.  Respiratory: Normal respiratory effort.  Gastrointestinal: Abdomen is distended and tympanic.  Ostomy in center of abdomen. Mildly TTP  Musculoskeletal: No deformities.  Neurologic: Normal speech and language. No gross focal neurologic deficits are appreciated.  Skin: Skin is warm, dry and intact. No rash noted.      EKG       Radiology     XR Abdomen Portable   Preliminary Result   Esophagogastric tube with side port and tip overlying the stomach.      Multiple distended small bowel loops better characterized on same-day CT abdomen/pelvis.         CT Abdomen Pelvis W IV Contrast Only   Final Result   -Dilation and fecalization multiple loops of small bowel with a probable transition point in the mid pelvis, concerning for developing/partial small bowel obstruction.      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      - Redemonstrated segment 5 lesion, consistent with known hepatocellular carcinoma better described on MRI.      -Additional findings, as described above.          Labs     Labs Reviewed   COMPREHENSIVE METABOLIC PANEL - Abnormal; Notable for the following components:       Result Value    Chloride 110 (*)     BUN 28 (*)     Creatinine 1.37 (*)     eGFR CKD-EPI (2021) Male 18 (*)     Albumin 3.3 (*)     Total Protein 8.7 (*)     AST 64 (*)     ALT 54 (*)     Alkaline Phosphatase 215 (*)     All other components within normal limits   CBC W/ AUTO DIFF - Abnormal; Notable for the following components:    WBC 14.4 (*)     RDW 15.3 (*)     Absolute Neutrophils 11.6 (*)     Absolute Monocytes 1.5 (*)     All other components within normal limits   LIPASE - Normal   LACTATE, VENOUS, WHOLE BLOOD - Normal   CBC W/ DIFFERENTIAL    Narrative:  The following orders were created for panel order CBC w/ Differential.                  Procedure                               Abnormality         Status                                     ---------                               -----------         ------                                     CBC w/ Differential[[id number]] Abnormal            Final result                                                 Please view results for these tests on the individual orders.   URINALYSIS WITH MICROSCOPY WITH CULTURE REFLEX       Pertinent labs & imaging results that were available during my care of the patient were reviewed by me and considered in my medical decision making (see chart for details).    Documentation assistance was provided by Michaelene Song, on Apr 12, 2023 at 17:34 for Arliss Journey, DO, MPH.       I have reviewed the chart and agree with the documentation provided for me by the scribe. Marlan Palau, DO         [place], [place]  04/12/23 2251

## 2023-04-13 NOTE — Unmapped (Signed)
[facility] Lower Gastrointestinal Surgery  History & Physical     Patient Name: Thomas Rosales   MRN #: [id number]   Date of Service: Apr 12, 2023    Admitting Physician: Clovis Cao, MD     Chief Complaint: Abdominal pain     Assessment/Plan  Thomas Rosales is a 82 y.o. male with PMH Crohn's disease s/p L hemicolectomy and end colostomy in 1995 presenting with abdominal pain and decreased ostomy output concerning for bowel obstruction.     - Admit to SRG, Floor status  - IVF: LR infusion 100 mL/hr  - NGT: LIWS  - Diet: NPO  - Labs: CBC, BMP, Mag, and Phos   - Meds: Tylenol, IV dilaudid, Zofran, Correctional insulin, and PPI  - DVT Prophylaxis: lovenox 40 mg daily  - Holding all home meds  - KUB confirmed NGT placement     HPI  Thomas Rosales is a 82 y.o. male with a history of CHF, Crohn's disease, CKD, HTN, DM2, NASH admitted on 04/12/2023 for abdominal pain and decreased ostomy output.   Patient reports symptom onset Friday night, primarily abdominal pain as well as decreased ostomy output and abdominal distention. He also endorses nausea and had 1 episode of self induced vomiting. He has had multiple episodes of these symptoms in the past which he manages at home by decreasing PO intake and ambulating and episodes tend to resolve on their own. He has had a history of multiple bowel obstructions including 2 since December that he managed at home.   Of note, he states last time he was hospitalized, he was started on TPN and almost died from it because my liver could not handle it. He also has a history of multiple fistulas from his Crohn's disease.   Outside CTAP imaging shows probable transition point in mid pelvis concerning for developing/partial SBO and enterocutaneous fistula.    Past surgical history includes multiple abdominal surgeries listed below.     Review of Systems: A 12 system review of systems was negative except as noted in the HPI.    No Known Allergies  Past Medical History: Diagnosis Date    Anal fissure 1983    Apnea, sleep     uses CPAP at night. instructed to bring DOS.     Basal cell carcinoma     Cancer (CMS-HCC)     skin cancer    CHF (congestive heart failure) (CMS-HCC)     Crohn's disease (CMS-HCC)     Diverticulitis of colon     Fatty liver     Gallstones     GERD (gastroesophageal reflux disease)     H/O echocardiogram     Hypertension     Irritable bowel syndrome     Kidney stone     NASH (nonalcoholic steatohepatitis)     Syncope     dizziness    Type 2 diabetes mellitus, without long-term current use of insulin (CMS-HCC) 10/24/2022     Past Surgical History:   Procedure Laterality Date    APPENDECTOMY      BOWEL RESECTION  1981, 1984, 1995    3 ft small intestine, <1 ft large intestines, cecum removed; 2nd bowel resection anal fistulas removed; 3rd bowel resection most of colon removed, colostomy placed.    CARDIAC CATHETERIZATION      CARDIOVASCULAR STRESS TEST      CHOLECYSTECTOMY      COLON SURGERY      Same as small bowel surgery  COLOSTOMY  1995    EYE SURGERY  1/20 & 4/21    Cataract & vitrious removal    FRACTURE SURGERY  1961    Broken tibia and fibula    HERNIA REPAIR  2019    Done by Dr Day    LAPAROSCOPIC SMALL BOWEL RESECTION      LEG SURGERY Left     pins in shin    LITHOTRIPSY      PR COLONOSCOPY FLX DX W/COLLJ SPEC WHEN PFRMD  03/19/2018    Procedure: COLONOSCOPY WITH POSSIBLE BIOPSY, POSSIBLE POLYPECTOMY.;  Surgeon: Tressie Ellis, MD;  Location: ENDO OR [facility];  Service: Gastroenterology    PR COLSC FLEXIBLE W/TRANSENDOSCOPIC BALLOON DILAT N/A 09/13/2022    Procedure: COLONOSCOPY, FLEXIBLE; WITH DILATION BY BALLOON, 1 OR MORE STRICTURES;  Surgeon: Luanne Bras, MD;  Location: [facility] PROCEDURES [facility];  Service: Gastroenterology    PR INCISION & DRAINAGE ABSCESS SIMPLE/SINGLE N/A 08/11/2018    Procedure: INCISION AND DRAINAGE OF ABDOMINAL WALL ABSCESS;  Surgeon: Jerry Caras Day, MD;  Location: OR [place];  Service: General Surgery    PR LAP, INCISIONAL HERNIA REPAIR,REDUCIBLE N/A 06/02/2018    Procedure: LAPAROSCOPIC ASSISTED PARASTOMAL HERNIA REPAIR AND INCISIONAL HERNIA REPAIR WITH MESH;  Surgeon: Jerry Caras Day, MD;  Location: OR [place];  Service: General Surgery    PR VITRECTOMY,MECHANICAL Right 03/28/2020    Procedure: trans pars planar vitrectomy, right eye   ;  Surgeon: Emi Holes, MD;  Location[facility] [place];  Service: Ophthalmology    PR XCAPSL CTRC RMVL INSJ IO LENS PROSTH W/O ECP Right 01/13/2019    Procedure: PHACOEMULSIFICATION OF CATARACT WITH INSERTION OF INTRAOCULAR LENS/TORIC LENS/ORA;  Surgeon: Juanna Cao, MD;  Location: OR [facility];  Service: Ophthalmology    PR XCAPSL CTRC RMVL INSJ IO LENS PROSTH W/O ECP Left 01/27/2019    Procedure: PHACOEMULSIFICATION OF CATARACT WITH INSERTION OF INTRAOCULAR LENS/TORIC LENS/ORA;  Surgeon: Juanna Cao, MD;  Location: OR [facility];  Service: Ophthalmology    RESECTION SMALL BOWEL / CLOSURE ILEOSTOMY  1981    temporary ileostomy     REVISION COLOSTOMY  2009    SMALL INTESTINE SURGERY  3x since 1984    Crohn???s disease    URETERAL STENT PLACEMENT  2008     Current Facility-Administered Medications   Medication Dose Route Frequency Provider Last Rate Last Admin    rifAXIMin (XIFAXAN) tablet 550 mg  550 mg Oral BID Bhimani, Smeet Ramesh, DO   550 mg at 04/13/23 0022     Family History   Problem Relation Age of Onset    Heart disease Mother         pacemaker    Arthritis Mother     Dementia Father     Cancer Father      Social History     Socioeconomic History    Marital status: Married     Spouse name: None    Number of children: 2    Years of education: None    Highest education level: Master's degree (e.g., MA, MS, MEng, MEd, MSW, MBA)   Tobacco Use    Smoking status: Former     Current packs/day: 0.00     Average packs/day: 1 pack/day for 20.0 years (20.0 ttl pk-yrs)     Types: Cigarettes     Start date: 12/09/1964     Quit date: 12/09/1984     Years since quitting: 38.3    Smokeless tobacco: Never   Vaping  Use    Vaping status: Never Used   Substance and Sexual Activity    Alcohol use: Not Currently     Comment: occassionally    Drug use: No    Sexual activity: Yes     Partners: Female     Birth control/protection: None     Social Determinants of Health     Financial Resource Strain: Low Risk  (11/18/2022)    Overall Financial Resource Strain (CARDIA)     Difficulty of Paying Living Expenses: Not hard at all   Food Insecurity: No Food Insecurity (11/18/2022)    Hunger Vital Sign     Worried About Running Out of Food in the Last Year: Never true     Ran Out of Food in the Last Year: Never true   Transportation Needs: No Transportation Needs (11/18/2022)    PRAPARE - Therapist, art (Medical): No     Lack of Transportation (Non-Medical): No   Physical Activity: Inactive (04/19/2021)    Exercise Vital Sign     Days of Exercise per Week: 0 days     Minutes of Exercise per Session: 0 min   Stress: No Stress Concern Present (04/19/2021)    [employer] of Occupational Health - Occupational Stress Questionnaire     Feeling of Stress : Not at all   Social Connections: Socially Integrated (04/19/2021)    Social Connection and Isolation Panel [NHANES]     Frequency of Communication with Friends and Family: More than three times a week     Frequency of Social Gatherings with Friends and Family: More than three times a week     Attends Religious Services: More than 4 times per year     Active Member of [employer] or Organizations: Yes     Attends Banker Meetings: More than 4 times per year     Marital Status: Married     Objective  Physical Exam:  Vitals:    04/13/23 0437   BP: 135/67   Pulse: 86   Resp: 18   Temp: 36.7 ??C (98.1 ??F)   SpO2: 96%        General:  No acute distress, chronically ill appearing. NGT in place, feculent output      Pulmonary: Normal respiratory effort.    Cardiovascular: Regular rate    Abdominal: Tender primarily around ostomy site, distended, tympanic. No guarding. Colostomy appears pink and healthy with no gas or stool output. Old midline incision appears well healed, dry dressing over prior fistula site, no opening or output.    Extremities Warm and well perfused bilaterally, minimal edema around bilateral ankles.    Neurologic: AAOx3.    Psychiatric: Judgement and insight are appropriate. Oriented to person, place, and time. Affect is appropriate.      Brandon Melnick, MD  PGY-1 General Surgery

## 2023-04-14 DIAGNOSIS — K7581 Nonalcoholic steatohepatitis (NASH): Principal | ICD-10-CM

## 2023-04-14 DIAGNOSIS — K7460 Unspecified cirrhosis of liver: Principal | ICD-10-CM

## 2023-04-14 DIAGNOSIS — R188 Other ascites: Principal | ICD-10-CM

## 2023-04-14 DIAGNOSIS — C220 Liver cell carcinoma: Principal | ICD-10-CM

## 2023-04-14 LAB — BASIC METABOLIC PANEL
ANION GAP: 11 mmol/L (ref 5–14)
BLOOD UREA NITROGEN: 23 mg/dL (ref 9–23)
BUN / CREAT RATIO: 20
CALCIUM: 9.1 mg/dL (ref 8.7–10.4)
CHLORIDE: 106 mmol/L (ref 98–107)
CO2: 20 mmol/L (ref 20.0–31.0)
CREATININE: 1.17 mg/dL
EGFR CKD-EPI (2021) MALE: 63 mL/min/{1.73_m2} (ref >=60)
GLUCOSE RANDOM: 106 mg/dL (ref 70–179)
SODIUM: 137 mmol/L (ref 135–145)

## 2023-04-14 LAB — CBC
HEMATOCRIT: 37.7 % — ABNORMAL LOW (ref 39.0–48.0)
HEMOGLOBIN: 13 g/dL (ref 12.9–16.5)
MEAN CORPUSCULAR HEMOGLOBIN CONC: 34.6 g/dL (ref 32.0–36.0)
MEAN CORPUSCULAR HEMOGLOBIN: 32.3 pg (ref 25.9–32.4)
MEAN CORPUSCULAR VOLUME: 93.5 fL (ref 77.6–95.7)
MEAN PLATELET VOLUME: 9.3 fL (ref 6.8–10.7)
PLATELET COUNT: 170 10*9/L (ref 150–450)
RED BLOOD CELL COUNT: 4.03 10*12/L — ABNORMAL LOW (ref 4.26–5.60)
RED CELL DISTRIBUTION WIDTH: 14.5 % (ref 12.2–15.2)
WBC ADJUSTED: 9.4 10*9/L (ref 3.6–11.2)

## 2023-04-14 MED ADMIN — methocarbamol (ROBAXIN) 500 mg in sodium chloride (NS) 0.9 % 50 mL IVPB: 500 mg | INTRAVENOUS | @ 23:00:00

## 2023-04-14 MED ADMIN — albumin human 25 % bottle 25 g: 25 g | INTRAVENOUS | @ 21:00:00

## 2023-04-14 MED ADMIN — albumin human 5 % bottle 25 g: 25 g | INTRAVENOUS | @ 22:00:00 | Stop: 2023-04-14

## 2023-04-14 MED ADMIN — albumin human 25 % bottle 25 g: 25 g | INTRAVENOUS | @ 11:00:00

## 2023-04-14 MED ADMIN — morphine injection 2 mg: 2 mg | INTRAVENOUS | @ 14:00:00 | Stop: 2023-04-14

## 2023-04-14 MED ADMIN — morphine injection 1 mg: 1 mg | INTRAVENOUS | @ 05:00:00 | Stop: 2023-04-14

## 2023-04-14 MED ADMIN — ondansetron (ZOFRAN) injection 4 mg: 4 mg | INTRAVENOUS | @ 17:00:00

## 2023-04-14 MED ADMIN — enoxaparin (LOVENOX) syringe 40 mg: 40 mg | SUBCUTANEOUS | @ 11:00:00

## 2023-04-14 MED ADMIN — morphine injection 2 mg: 2 mg | INTRAVENOUS | @ 17:00:00 | Stop: 2023-04-14

## 2023-04-14 MED ADMIN — rifAXIMin (XIFAXAN) tablet 550 mg: 550 mg | ORAL | @ 13:00:00 | Stop: 2023-04-17

## 2023-04-14 MED ADMIN — morphine injection 2 mg: 2 mg | INTRAVENOUS | @ 20:00:00 | Stop: 2023-04-14

## 2023-04-14 MED ADMIN — ondansetron (ZOFRAN) injection 4 mg: 4 mg | INTRAVENOUS | @ 08:00:00 | Stop: 2023-04-14

## 2023-04-14 MED ADMIN — acetaminophen (OFIRMEV) 10 mg/mL injection 650 mg 65 mL: 650 mg | INTRAVENOUS | @ 01:00:00 | Stop: 2023-04-13

## 2023-04-14 MED ADMIN — rifAXIMin (XIFAXAN) tablet 550 mg: 550 mg | ORAL | @ 01:00:00 | Stop: 2023-04-17

## 2023-04-14 MED ADMIN — morphine injection 2 mg: 2 mg | INTRAVENOUS | @ 09:00:00 | Stop: 2023-04-14

## 2023-04-14 MED ADMIN — lactated Ringers infusion: 50 mL/h | INTRAVENOUS | @ 09:00:00

## 2023-04-14 NOTE — Unmapped (Signed)
-   VS Stable  - Adequate UOP, voiding freely  - NGT maintained in place, output throughout the day   - NGT 77F KUB done, report pending   - Complaint of abd distention, relieved with 2mG  morphine  - No output form ostomy bag   - No questions or concerns at this point in time      Problem: Adult Inpatient Plan of Care  Goal: Plan of Care Review  Outcome: Progressing  Goal: Patient-Specific Goal (Individualized)  Outcome: Progressing  Goal: Absence of Hospital-Acquired Illness or Injury  Outcome: Progressing  Intervention: Prevent and Manage VTE (Venous Thromboembolism) Risk  Recent Flowsheet Documentation  Taken 04/13/2023 1800 by Caffie Pinto, RN  Anti-Embolism Device Type: SCD, Knee  Anti-Embolism Intervention: Refused  Anti-Embolism Device Location: BLE  Taken 04/13/2023 1600 by Caffie Pinto, RN  Anti-Embolism Device Type: SCD, Knee  Anti-Embolism Intervention: Refused  Anti-Embolism Device Location: BLE  Taken 04/13/2023 1400 by Caffie Pinto, RN  Anti-Embolism Device Type: SCD, Knee  Anti-Embolism Intervention: Refused  Anti-Embolism Device Location: BLE  Taken 04/13/2023 1200 by Caffie Pinto, RN  Anti-Embolism Device Type: SCD, Knee  Anti-Embolism Intervention: Refused  Anti-Embolism Device Location: BLE  Taken 04/13/2023 1000 by Caffie Pinto, RN  Anti-Embolism Device Type: SCD, Knee  Anti-Embolism Intervention: Refused  Anti-Embolism Device Location: BLE  Taken 04/13/2023 0828 by Caffie Pinto, RN  Anti-Embolism Device Type: SCD, Knee  Anti-Embolism Intervention: Refused  Taken 04/13/2023 0800 by Caffie Pinto, RN  Anti-Embolism Device Type: SCD, Knee  Anti-Embolism Intervention: Refused  Anti-Embolism Device Location: BLE  Goal: Optimal Comfort and Wellbeing  Outcome: Progressing  Goal: Readiness for Transition of Care  Outcome: Progressing  Goal: Rounds/Family Conference  Outcome: Progressing     Problem: Fall Injury Risk  Goal: Absence of Fall and Fall-Related Injury  Outcome: Progressing

## 2023-04-14 NOTE — Unmapped (Signed)
Lower Gastrointestinal Surgery Progress Note    Today's Date: 04/14/2023  Admission Date: 04/12/2023  Length of Stay: Day 2, * No surgery found [facility] Service: Henreitta Leber [facility])  Attending Physician: Clovis Cao, MD     Assessment   Active Problems:    * No active hospital problems. *    Thomas Rosales is a 82 y.o. 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 Since then, patient has had recurrent SBOs managed non-operatively. He was admitted on  5/4 for SBO and lack of ostomy output. The patient is being managed non-operatively.    Subjective   NAEON. Patient reports abdominal pain since last night, better with medication. Says the pain is a 6/10 this AM and that his belly is tight. Also reports nausea better with Zofran and NGT flushes. VSS. UOP 600 mL. NGT with 2000 mL output, dark brown.     Plan   Neuro/Psych:  - IV pain, nausea medications as needed    CV/Pulm:  - HDS, oxygenating well on RA    FEN/GI:  - IVF: LR 50 cc  - diet: NPO Sips with meds; Medically necessary   - NGT LIWS    *Hx of Cirrhosis  - Appreciate GI recsfor Hx o   - Albumin 25% BID x 2 days from 5/5   - mIVF @ 59mL/hr   - Continue Rifaximin    Renal/GU:  - voiding spontaneously    Endo:  - SSI, POCT glucose checks    Heme/ID:  - H&H stable, no active signs/symptoms of bleeding    Prophylaxis: prophylactic Lovenox, SCDs, out of bed/ambulate TID, IS, PPI  - patient/family learning Lovenox injections for home, given inflammatory bowel disease history and increased VTE risk    Access: peripheral IV    Dispo: floor status    Objective   Vital signs over the last 24 hours:  Temp:  [36.5 ??C (97.7 ??F)-36.9 ??C (98.4 ??F)] 36.8 ??C (98.2 ??F)  Heart Rate:  [76-83] 83  Resp:  [18] 18  BP: (125-143)/(51-64) 135/51  MAP (mmHg):  [74-83] 74  SpO2:  [95 %-97 %] 97 %    Physical Exam:  Gen: alert, in mild distress  CV: regular rate, normotensive  Pulm: breathing comfortably on room air  Abd: moderately distended, and appropriately tender to palpation.  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  Colostomy stoma pink, moist, nonprolapsed. Ostomy bag with some brown liquid output.  Ext: warm and well perfused bilaterally, no edema     [name], MS4    I attest that I have reviewed the medical student note and that the components of the history of the present illness, the physical exam, and the assessment and plan documented were performed by me or were performed in my presence by the student where I verified the documentation and performed (or re-performed) the exam and medical decision making.

## 2023-04-14 NOTE — Unmapped (Addendum)
Care Management  Initial Transition Planning Assessment              General  Care Manager assessed the patient by: In person interview with patient & wife [name] & daughter Waynetta Sandy, Medical record review, Discussion with Clinical Care team    Patient is a 82yo male with a medical history significant for MASLD cirrhosis, DM, HCC, Crohn' s/p left hemicolectomy and end colostomy in 1995. Since then, patient has had recurrent SBOs managed non-operatively. He was admitted on 04/12/23 for SBO and lack of ostomy output. The patient is being managed non-operatively. Pt currently has NGT for decompression.     Patient lives with his wife [name] - daughter Waynetta Sandy lives nearby. He previously had HH RN, PT, OT with [facility] - they have since closed him out. RN came out weekly for PICC line care (had TPN). Patient feels HH PT would be helpful again. CM will send [facility] PT referral prior to discharge.    Orientation Level: Oriented X4  Functional level prior to admission: Independent  Reason for referral: Discharge Planning, Home Health    Type of Residence: Mailing Address:  [address]  [place] [address]  Contacts: see below  Patient Phone Number: [phone]        Medical Provider(s): Cory Roughen, PA  Reason for Admission: Admitting Diagnosis:  Small bowel obstruction (CMS-HCC) [K56.609]  Past Medical History: Anal fissure (1983), Apnea, sleep, Basal cell carcinoma, Cancer (CMS-HCC), CHF (congestive heart failure) (CMS-HCC), Crohn's disease (CMS-HCC), Diverticulitis of colon, Fatty liver, Gallstones, GERD (gastroesophageal reflux disease), H/O echocardiogram, Hypertension, Irritable bowel syndrome, Kidney stone, NASH (nonalcoholic steatohepatitis), Syncope, and Type 2 diabetes mellitus, without long-term current use of insulin (CMS-HCC) (10/24/2022).  Past Surgical History: Leg Surgery (Left); Bowel resection (1981, 1984, 1995); Resection small bowel / closure ileostomy (1981); Colostomy (1995); Ureteral stent placement (2008); Cardiovascular stress test; pr colonoscopy flx dx w/collj spec when pfrmd (03/19/2018); Appendectomy; Cholecystectomy; Revision Colostomy (2009); Lithotripsy; pr lap, incisional hernia repair,reducible (N/A, 06/02/2018); pr incision & drainage abscess simple/single (N/A, 08/11/2018); pr xcapsl ctrc rmvl insj io lens prosth w/o ecp (Right, 01/13/2019); pr xcapsl ctrc rmvl insj io lens prosth w/o ecp (Left, 01/27/2019); Cardiac catheterization; pr vitrectomy,mechanical (Right, 03/28/2020); Eye surgery (1/20 & 4/21); Small intestine surgery (3x since 1984); Fracture surgery (1961); Hernia repair (2019); Colon surgery; pr colsc flexible w/transendoscopic balloon dilat (N/A, 09/13/2022); and Laparoscopic small bowel resection.   Previous admit date: 11/15/2022    Primary Insurance- Payor: MEDICARE / Plan: MEDICARE PART A AND PART B / Product Type: *No Product type* /   Secondary Insurance - Engineering geologist  Prescription Coverage - Medicare  Preferred Pharmacy - [facility] - [place], [place] - [address]  [facility] - [place], [place] - [address]    Transportation home: Private vehicle    Contact/Decision Maker  Extended Emergency Contact Information  Primary Emergency Contact: Crooke,Connie  Mobile Phone: [phone]  Relation: Spouse    Secondary Emergency Contact: Marjory Sneddon  Address: [address]           [place], [place] [address]  Home Phone: [phone]  Mobile Phone: [phone]  Relation: Daughter    Legal Next of Kin / Guardian / POA / Advance Directives     HCDM (patient stated preference): Jaedyn, Demello - Spouse - [phone]    Advance Directive (Medical Treatment)  Does patient have an advance directive covering medical treatment?: Patient does not  have advance directive covering medical treatment.    Health Care Decision Maker [HCDM] (Medical & Mental Health Treatment)  Healthcare Decision Maker: Patient needs follow-up to appoint a Health Care Decision Maker.  Information offered on HCDM, Medical & Mental Health advance directives:: Patient given information.    Advance Directive (Mental Health Treatment)  Does patient have an advance directive covering mental health treatment?: Patient does not have advance directive covering mental health treatment.    Readmission Information    Have you been hospitalized in the last 30 days?: No    Did the following happen with your discharge? N/A    Patient Information  Lives with: Spouse/significant other    Type of Residence: Private residence    Support Systems/Concerns: Spouse, Family Members  Patient lives with his wife [name]. [name] and their daughter Waynetta Sandy are supportive and able to assist pt at discharge. Patient would like [facility] PT services set up with [facility].     Responsibilities/Dependents at home?: No    Home Care services in place prior to admission?: No    Equipment Currently Used at Home: colostomy/ostomy supplies, rolling walker, cane  Current HME Agency: [facility] for ostomy supplies. Pt reports he has adequate supplies at home.    Currently receiving outpatient dialysis?: No    Financial Information    Need for financial assistance?: No    Social Determinants of Health  Social Determinants of Health were addressed in provider documentation.  Please refer to patient history.  Social Determinants of Health     Financial Resource Strain: Low Risk  (04/14/2023)    Overall Financial Resource Strain (CARDIA)     Difficulty of Paying Living Expenses: Not very hard   Internet Connectivity: Not on file   Food Insecurity: No Food Insecurity (04/14/2023)    Hunger Vital Sign     Worried About Running Out of Food in the Last Year: Never true     Ran Out of Food in the Last Year: Never true   Tobacco Use: Medium Risk (04/12/2023)    Patient History     Smoking Tobacco Use: Former     Smokeless Tobacco Use: Never     Passive Exposure: Not on file   Housing/Utilities: Low Risk (04/14/2023)    Housing/Utilities     Within the past 12 months, have you ever stayed: outside, in a car, in a tent, in an overnight shelter, or temporarily in someone else's home (i.e. couch-surfing)?: No     Are you worried about losing your housing?: No     Within the past 12 months, have you been unable to get utilities (heat, electricity) when it was really needed?: No   Alcohol Use: Not At Risk (04/19/2021)    Alcohol Use     How often do you have a drink containing alcohol?: Monthly or less     How many drinks containing alcohol do you have on a typical day when you are drinking?: 1 - 2     How often do you have 5 or more drinks on one occasion?: Never   Transportation Needs: No Transportation Needs (04/14/2023)    PRAPARE - Transportation     Lack of Transportation (Medical): No     Lack of Transportation (Non-Medical): No   Substance Use: Low Risk  (04/19/2021)    Substance Use     Taken prescription drugs for non-medical reasons: Never     Taken illegal drugs: Never     Patient indicated they  have taken drugs in the past year for non-medical reasons: Yes, [positive answer(s)]: Not on file   Health Literacy: Low Risk  (04/21/2022)    Health Literacy     : Never   Physical Activity: Inactive (04/19/2021)    Exercise Vital Sign     Days of Exercise per Week: 0 days     Minutes of Exercise per Session: 0 min   Interpersonal Safety: Not on file   Stress: No Stress Concern Present (04/19/2021)    [employer] of Occupational Health - Occupational Stress Questionnaire     Feeling of Stress : Not at all   Intimate Partner Violence: Not At Risk (04/19/2021)    Humiliation, Afraid, Rape, and Kick questionnaire     Fear of Current or Ex-Partner: No     Emotionally Abused: No     Physically Abused: No     Sexually Abused: No   Depression: Not at risk (04/19/2021)    PHQ-2     PHQ-2 Score: 0   Social Connections: Socially Integrated (04/19/2021)    Social Connection and Isolation Panel [NHANES]     Frequency of Communication with Friends and Family: More than three times a week     Frequency of Social Gatherings with Friends and Family: More than three times a week     Attends Religious Services: More than 4 times per year     Active Member of [employer] or Organizations: Yes     Attends Engineer, structural: More than 4 times per year     Marital Status: Married       Complex Discharge Information    Is patient identified as a difficult/complex discharge?: No    Interventions: None required    Discharge Needs Assessment  Concerns to be Addressed: care coordination/care conferences, discharge planning    Clinical Risk Factors: > 65, Multiple Diagnoses (Chronic), Functional Limitations    Barriers to taking medications: No    Prior overnight hospital stay or ED visit in last 90 days: No    Anticipated Changes Related to Illness: none    Equipment Needed After Discharge: none    Discharge Facility/Level of Care Needs: Home with [facility]    Readmission  Risk of Unplanned Readmission Score: UNPLANNED READMISSION SCORE: 18.71%  Predictive Model Details          19% (Medium)  Factor Value    Calculated 04/14/2023 12:03 17% Number of ED visits in last six months 3    [place] Risk of Unplanned Readmission Model 13% Number of active inpatient medication orders 19     10% Number of hospitalizations in last year 2     9% ECG/EKG order present in last 6 months     8% Encounter of ten days or longer in last year present     8% Diagnosis of electrolyte disorder present     7% Imaging order present in last 6 months     6% Age 74     6% Phosphorous result present     5% Charlson Comorbidity Index 4     4% Active anticoagulant inpatient medication order present     4% Latest creatinine high (1.24 mg/dL)     2% Future appointment scheduled     2% Current length of stay 1.697 days      Readmitted Within the Last 30 Days? No   Patient at risk for readmission?: No    Discharge Plan  Screen findings are: Discharge planning needs identified  or anticipated - HH PT only.    Expected Discharge Date: 04/16/2023    Expected Transfer from Critical Care:  N/A    Quality data for continuing care services shared with patient and/or representative?: N/A  Patient and/or family were provided with choice of facilities / services that are available and appropriate to meet post hospital care needs?: Yes   List choices in order highest to lowest preferred, if applicable. : Patient prefers [facility] for any HH services if possible.    Initial Assessment complete?: Yes    Emilio Math, CCM  Care Manager

## 2023-04-14 NOTE — Unmapped (Signed)
One report of nausea overnight relieved with zofran. NGT patent with brown gritty output that has strong odor. No output out of colostomy. Voiding in urinal at bedside. Remains NPO with IV fluids running.   Problem: Adult Inpatient Plan of Care  Goal: Plan of Care Review  Outcome: Progressing  Goal: Patient-Specific Goal (Individualized)  Outcome: Progressing  Goal: Absence of Hospital-Acquired Illness or Injury  Outcome: Progressing  Intervention: Identify and Manage Fall Risk  Recent Flowsheet Documentation  Taken 04/14/2023 0600 by Sherril Cong, RN  Safety Interventions:   low bed   fall reduction program maintained   nonskid shoes/slippers when out of bed  Taken 04/14/2023 0400 by Sherril Cong, RN  Safety Interventions:   low bed   fall reduction program maintained   nonskid shoes/slippers when out of bed  Taken 04/14/2023 0200 by Sherril Cong, RN  Safety Interventions:   low bed   nonskid shoes/slippers when out of bed  Taken 04/13/2023 2200 by Sherril Cong, RN  Safety Interventions:   low bed   nonskid shoes/slippers when out of bed  Taken 04/13/2023 2000 by Sherril Cong, RN  Safety Interventions:   low bed   fall reduction program maintained   nonskid shoes/slippers when out of bed  Intervention: Prevent Skin Injury  Recent Flowsheet Documentation  Taken 04/14/2023 0600 by Sherril Cong, RN  Positioning for Skin: Supine/Back  Taken 04/14/2023 0400 by Sherril Cong, RN  Positioning for Skin: Supine/Back  Taken 04/14/2023 0200 by Sherril Cong, RN  Positioning for Skin: Supine/Back  Taken 04/13/2023 2200 by Sherril Cong, RN  Positioning for Skin: Right  Taken 04/13/2023 2000 by Sherril Cong, RN  Positioning for Skin: Supine/Back  Goal: Optimal Comfort and Wellbeing  Outcome: Progressing  Goal: Readiness for Transition of Care  Outcome: Progressing  Goal: Rounds/Family Conference  Outcome: Progressing     Problem: Fall Injury Risk  Goal: Absence of Fall and Fall-Related Injury  Outcome: Progressing  Intervention: Promote Injury-Free Environment  Recent Flowsheet Documentation  Taken 04/14/2023 0600 by Sherril Cong, RN  Safety Interventions:   low bed   fall reduction program maintained   nonskid shoes/slippers when out of bed  Taken 04/14/2023 0400 by Sherril Cong, RN  Safety Interventions:   low bed   fall reduction program maintained   nonskid shoes/slippers when out of bed  Taken 04/14/2023 0200 by Sherril Cong, RN  Safety Interventions:   low bed   nonskid shoes/slippers when out of bed  Taken 04/13/2023 2200 by Sherril Cong, RN  Safety Interventions:   low bed   nonskid shoes/slippers when out of bed  Taken 04/13/2023 2000 by Sherril Cong, RN  Safety Interventions:   low bed   fall reduction program maintained   nonskid shoes/slippers when out of bed

## 2023-04-15 DIAGNOSIS — K760 Fatty (change of) liver, not elsewhere classified: Secondary | ICD-10-CM | POA: Diagnosis present

## 2023-04-15 LAB — HEPATIC FUNCTION PANEL
ALBUMIN: 4.7 g/dL (ref 3.4–5.0)
ALKALINE PHOSPHATASE: 82 U/L (ref 46–116)
ALT (SGPT): 25 U/L (ref 10–49)
AST (SGOT): 21 U/L (ref <=34)
BILIRUBIN DIRECT: 1.4 mg/dL — ABNORMAL HIGH (ref 0.00–0.30)
BILIRUBIN TOTAL: 2.5 mg/dL — ABNORMAL HIGH (ref 0.3–1.2)
PROTEIN TOTAL: 8 g/dL (ref 5.7–8.2)

## 2023-04-15 LAB — CBC
HEMATOCRIT: 38.2 % — ABNORMAL LOW (ref 39.0–48.0)
HEMOGLOBIN: 13.2 g/dL (ref 12.9–16.5)
MEAN CORPUSCULAR HEMOGLOBIN CONC: 34.6 g/dL (ref 32.0–36.0)
MEAN CORPUSCULAR HEMOGLOBIN: 32.2 pg (ref 25.9–32.4)
MEAN CORPUSCULAR VOLUME: 93.2 fL (ref 77.6–95.7)
MEAN PLATELET VOLUME: 9.8 fL (ref 6.8–10.7)
PLATELET COUNT: 176 10*9/L (ref 150–450)
RED BLOOD CELL COUNT: 4.1 10*12/L — ABNORMAL LOW (ref 4.26–5.60)
RED CELL DISTRIBUTION WIDTH: 14.7 % (ref 12.2–15.2)
WBC ADJUSTED: 10.8 10*9/L (ref 3.6–11.2)

## 2023-04-15 LAB — BASIC METABOLIC PANEL
ANION GAP: 14 mmol/L (ref 5–14)
ANION GAP: 12 mmol/L (ref 5–14)
BLOOD UREA NITROGEN: 45 mg/dL — ABNORMAL HIGH (ref 9–23)
BLOOD UREA NITROGEN: 59 mg/dL — ABNORMAL HIGH (ref 9–23)
BUN / CREAT RATIO: 27
BUN / CREAT RATIO: 37
CALCIUM: 9.5 mg/dL (ref 8.7–10.4)
CALCIUM: 8.8 mg/dL (ref 8.7–10.4)
CHLORIDE: 104 mmol/L (ref 98–107)
CHLORIDE: 103 mmol/L (ref 98–107)
CO2: 22 mmol/L (ref 20.0–31.0)
CO2: 24 mmol/L (ref 20.0–31.0)
CREATININE: 1.64 mg/dL — ABNORMAL HIGH
CREATININE: 1.58 mg/dL — ABNORMAL HIGH
EGFR CKD-EPI (2021) MALE: 42 mL/min/{1.73_m2} — ABNORMAL LOW (ref >=60)
EGFR CKD-EPI (2021) MALE: 44 mL/min/{1.73_m2} — ABNORMAL LOW (ref >=60)
GLUCOSE RANDOM: 113 mg/dL (ref 70–179)
GLUCOSE RANDOM: 106 mg/dL (ref 70–179)
POTASSIUM: 3.9 mmol/L (ref 3.4–4.8)
POTASSIUM: 3.8 mmol/L (ref 3.4–4.8)
SODIUM: 140 mmol/L (ref 135–145)
SODIUM: 139 mmol/L (ref 135–145)

## 2023-04-15 LAB — PHOSPHORUS: PHOSPHORUS: 3.6 mg/dL (ref 2.4–5.1)

## 2023-04-15 LAB — MAGNESIUM: MAGNESIUM: 2.1 mg/dL (ref 1.6–2.6)

## 2023-04-15 MED ADMIN — morphine injection 2 mg: 2 mg | INTRAVENOUS | @ 18:00:00

## 2023-04-15 MED ADMIN — lactated ringers bolus 500 mL: 500 mL | INTRAVENOUS | @ 14:00:00 | Stop: 2023-04-15

## 2023-04-15 MED ADMIN — lactulose enema: 200 g | RECTAL | Stop: 2023-04-15

## 2023-04-15 MED ADMIN — morphine 4 mg/mL injection 3 mg: 3 mg | INTRAVENOUS | @ 06:00:00 | Stop: 2023-04-15

## 2023-04-15 MED ADMIN — enoxaparin (LOVENOX) syringe 40 mg: 40 mg | SUBCUTANEOUS | @ 09:00:00

## 2023-04-15 MED ADMIN — morphine 4 mg/mL injection 3 mg: 3 mg | INTRAVENOUS | @ 03:00:00

## 2023-04-15 MED ADMIN — morphine 4 mg/mL injection 3 mg: 3 mg | INTRAVENOUS | @ 09:00:00 | Stop: 2023-04-15

## 2023-04-15 MED ADMIN — methocarbamol (ROBAXIN) 500 mg in sodium chloride (NS) 0.9 % 50 mL IVPB: 500 mg | INTRAVENOUS | @ 05:00:00

## 2023-04-15 MED ADMIN — albumin human 5 % bottle 25 g: 25 g | INTRAVENOUS | @ 15:00:00 | Stop: 2023-04-15

## 2023-04-15 MED ADMIN — albumin human 25 % bottle 25 g: 25 g | INTRAVENOUS | @ 09:00:00 | Stop: 2023-04-15

## 2023-04-15 MED ADMIN — methocarbamol (ROBAXIN) 500 mg in sodium chloride (NS) 0.9 % 50 mL IVPB: 500 mg | INTRAVENOUS | @ 22:00:00

## 2023-04-15 MED ADMIN — methocarbamol (ROBAXIN) 500 mg in sodium chloride (NS) 0.9 % 50 mL IVPB: 500 mg | INTRAVENOUS | @ 14:00:00

## 2023-04-15 MED ADMIN — rifAXIMin (XIFAXAN) tablet 550 mg: 550 mg | ORAL | @ 13:00:00 | Stop: 2023-04-17

## 2023-04-15 MED ADMIN — rifAXIMin (XIFAXAN) tablet 550 mg: 550 mg | ORAL | @ 01:00:00 | Stop: 2023-04-17

## 2023-04-15 MED ADMIN — lactated Ringers infusion: 50 mL/h | INTRAVENOUS | @ 05:00:00

## 2023-04-15 NOTE — Unmapped (Signed)
POC reviewed with the patient. PT admitted with SBO. Patient is A/Ox4. NAD. Patient complaints of pain are controlled by pain medication.  Patient remains afebrile and exhibits no signs and symptoms of infection. Patient voiding and output has been good. Patient is currently NPO due to SBO. NG tube to LIWS.  Patient is standby assist when out of bed and has remained free from injury. Bed in low/locked position. Call bell in reach. Will Continue to monitor     Problem: Adult Inpatient Plan of Care  Goal: Plan of Care Review  Outcome: Progressing  Goal: Patient-Specific Goal (Individualized)  Outcome: Progressing  Goal: Absence of Hospital-Acquired Illness or Injury  Outcome: Progressing  Intervention: Prevent and Manage VTE (Venous Thromboembolism) Risk  Recent Flowsheet Documentation  Taken 04/14/2023 1928 by Baron Sane, RN  Anti-Embolism Intervention: Refused  Goal: Optimal Comfort and Wellbeing  Outcome: Progressing  Goal: Readiness for Transition of Care  Outcome: Progressing  Goal: Rounds/Family Conference  Outcome: Progressing     Problem: Fall Injury Risk  Goal: Absence of Fall and Fall-Related Injury  Outcome: Progressing

## 2023-04-15 NOTE — Unmapped (Signed)
Luminal Gastroenterology Consult Service   Initial Consultation         Assessment and Recommendations:   Thomas Rosales is a 82 y.o. 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 The patient is seen in consultation at the request of Sharyn Lull, MD ([facility])) for  bowel obstruction.    Assessment: Patient admitted on 5/5 for abdominal pain, decreased ostomy output with CT showing evidence of small bowel obstruction.  He has had multiple prior admissions for small bowel obstructions, most recently May 2023.  Etiology of his small bowel obstruction likely adhesions from multiple prior surgeries. He does have a known strictured ileo-colonic anstamosis which was unable to be traversed previously endoscopically without dilating however after reviewing his CT scan and discussing with radiology, this does not appear to be the source of his SBO. Therefore, would not recommend any endoscopic intervention at this time. Agree with ongoing supportive care for SBO and defer to primary whether he needs any surgical intervention.   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 No fever, WBC or evidence of evidence of abscess on imaging, therefore do not think he needs requires antibiotics at this time.    Recommendations:  - Recommend ongoing supportive care for small bowel obstruction with NG tube to low intermittent suction  - After discussion with radiology, we do not think any endoscopic intervention can be offered (transition point discrete from known ileocolonic anastomosis which has been previously stenotic)  - We see no indication for antibiotics at this time given no abscess seen on imaging, afebrile and normal WBC     Issues Impacting Complexity of Management:  -None    Recommendations discussed with the patient's primary team. We will sign-off at this time, please re-contact if additional questions or a new need for consultation arises.    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  The patient is status post right hemicolectomy right an ileocolonic anastomosis with a left sided colostomy.  He has had recurrent hospitalizations for small bowel obstruction which is thought to been due to adhesions or fibrotic stenosis of his known anastomosis ( small bowel crohn's thought to be less likely causing obstructions).    The patient was admitted on 5/5 for abdominal pain, decreased ostomy output all concerning for small bowel obstruction after undergoing a CT the day prior which showed dilation and fecalization of multiple loops of small bowel with a probable transition point in the mid pelvis concerning for a small bowel obstruction.  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    Throughout his hospitalization, his primary surgical team has been managing him conservatively with NG tube decompression but are considering surgical repair of known enteric cutaneous fistula.    Hemodynamically, the patient has been stable with heart rate around 90 bpm, normal temperature and blood pressure 129/56.  Labs show white blood cell count 10.8, hemoglobin 13.2 and slight elevation in creatinine to around 1.6 (baseline around 1.0). NG tube output around 4 L yesterday, 5/6.  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  Was also admitted May 2023 for small bowel obstruction which improved on its own and he did not require any surgery.    Objective: Temp:  [36.4 ??C (97.5 ??F)-36.9 ??C (98.5 ??F)] 36.8 ??C (98.2 ??F)  Heart Rate:  [79-99] 90  Resp:  [16-18] 18  BP: (117-129)/(54-67) 129/56  SpO2:  [95 %-98 %] 98 %    Gen: Chronically ill-appearing male in NAD, answers questions appropriately  Eyes: Sclera anicteric  Abdomen: Distended, tender abdomen  Extremities: No edema in the BLEs  Neuro: Normal speech. No asterixis.   Psych: Alert, normal mood and affect.     Pertinent Labs/Studies:  -I have visualized the patient's CT dated 5/4 which shows multiple dilated loops of bowel, no appreciable ascites  -I have reviewed the patient's labs from 5/5 which show stable Hgb, improving renal function (SCr), and stable LFTs

## 2023-04-15 NOTE — Unmapped (Shared)
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 Since then, patient has had recurrent SBOs managed non-operatively. He was admitted on  5/4 for SBO and lack of ostomy output. The patient is being managed non-operatively.    Subjective   NAEON. VSS. Pain well controlled with medication. Says he is doing a little better this AM. Daughter reporting some fogginess. UOP 1150 mL. NGT with 4070 mL output, darkish brown.     Plan   Neuro/Psych:  - IV pain, nausea medications as needed    CV/Pulm:  - HDS, oxygenating well on RA    FEN/GI:  - IVF: LR 50 ml  - LR 500 ml boluses  - diet: NPO Sips with meds; Medically necessary   - NGT LIWS  - Inpatient consult to general GI  - Hepatic function panel starting tomorrow    *Hx of Cirrhosis  - Appreciate GI recs for Hx o   - Albumin 25% BID x 2 days from 5/5   - mIVF @ 57mL/hr   - Continue Rifaximin    Renal/GU:  - voiding spontaneously  - Ucx 10,000 to 50,000 CFU/mL Enterobacter cloacae complex    Endo:  - SSI, POCT glucose checks    Heme/ID:  - H&H stable, no active signs/symptoms of bleeding    Prophylaxis: prophylactic Lovenox, SCDs, out of bed/ambulate TID, IS, PPI  - patient/family learning Lovenox injections for home, given inflammatory bowel disease history and increased VTE risk    Access: peripheral IV    Dispo: floor status  - PT/OT  - HH    Objective   Vital signs over the last 24 hours:  Temp:  [36.4 ??C (97.5 ??F)-36.9 ??C (98.4 ??F)] 36.8 ??C (98.2 ??F)  Heart Rate:  [79-99] 86  Resp:  [16-18] 16  BP: (106-129)/(54-67) 106/54  MAP (mmHg):  [69-80] 69  SpO2:  [95 %-98 %] 95 %    Physical Exam:  Gen: alert, in mild distress  CV: regular rate, normotensive  Pulm: breathing comfortably on room air  Abd: mildly distended, and appropriately tender to palpation.  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  Colostomy stoma pink, moist, nonprolapsed. Ostomy bag with some brown liquid output.  Ext: warm and well perfused bilaterally, no edema     Thomas Rosales, MS4    I attest that I have reviewed the medical student note and that the components of the history of the present illness, the physical exam, and the assessment and plan documented were performed by me or were performed in my presence by the student where I verified the documentation and performed (or re-performed) the exam and medical decision making.

## 2023-04-15 NOTE — Unmapped (Addendum)
Thomas Rosales is a 82 y.o. 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     The patient was maintained on bowel rest with NG tube decompression and supportive care with anti-emetics, IV fluid resuscitation, and electrolyte repletion. Hepatology and gastroenterology were consulted on admission, recommendations were appreciated and implemented.     The patient responded well to conservative therapy, with decreasing nausea and distention. He began to pass flatus and underwent a gastrografin challenge on 5/9. NGT was removed on 5/10 and his diet was restarted with clears and gently advanced. At the time of discharge, he is tolerating a regular diet and reports improvement of their previous obstructive symptoms. During the course of this inpatient stay, he has remained clinically stable. Home health therapy was coordinated by case management prior to discharge. The patient is being discharged on 04/20/23.

## 2023-04-16 LAB — BASIC METABOLIC PANEL
ANION GAP: 9 mmol/L (ref 5–14)
BLOOD UREA NITROGEN: 67 mg/dL — ABNORMAL HIGH (ref 9–23)
BUN / CREAT RATIO: 44
CALCIUM: 9.2 mg/dL (ref 8.7–10.4)
CHLORIDE: 104 mmol/L (ref 98–107)
CO2: 28 mmol/L (ref 20.0–31.0)
CREATININE: 1.54 mg/dL — ABNORMAL HIGH
EGFR CKD-EPI (2021) MALE: 45 mL/min/{1.73_m2} — ABNORMAL LOW (ref >=60)
GLUCOSE RANDOM: 108 mg/dL (ref 70–179)
POTASSIUM: 4 mmol/L (ref 3.4–4.8)
SODIUM: 141 mmol/L (ref 135–145)

## 2023-04-16 LAB — CBC
HEMATOCRIT: 39.6 % (ref 39.0–48.0)
HEMOGLOBIN: 13.4 g/dL (ref 12.9–16.5)
MEAN CORPUSCULAR HEMOGLOBIN CONC: 33.8 g/dL (ref 32.0–36.0)
MEAN CORPUSCULAR HEMOGLOBIN: 31.5 pg (ref 25.9–32.4)
MEAN CORPUSCULAR VOLUME: 93.3 fL (ref 77.6–95.7)
MEAN PLATELET VOLUME: 9.1 fL (ref 6.8–10.7)
PLATELET COUNT: 205 10*9/L (ref 150–450)
RED BLOOD CELL COUNT: 4.24 10*12/L — ABNORMAL LOW (ref 4.26–5.60)
RED CELL DISTRIBUTION WIDTH: 14.5 % (ref 12.2–15.2)
WBC ADJUSTED: 13.6 10*9/L — ABNORMAL HIGH (ref 3.6–11.2)

## 2023-04-16 LAB — PHOSPHORUS: PHOSPHORUS: 2.6 mg/dL (ref 2.4–5.1)

## 2023-04-16 LAB — HEPATIC FUNCTION PANEL
ALBUMIN: 4.4 g/dL (ref 3.4–5.0)
ALKALINE PHOSPHATASE: 91 U/L (ref 46–116)
ALT (SGPT): 21 U/L (ref 10–49)
AST (SGOT): 28 U/L (ref <=34)
BILIRUBIN DIRECT: 1.3 mg/dL — ABNORMAL HIGH (ref 0.00–0.30)
BILIRUBIN TOTAL: 2.1 mg/dL — ABNORMAL HIGH (ref 0.3–1.2)
PROTEIN TOTAL: 8.1 g/dL (ref 5.7–8.2)

## 2023-04-16 LAB — MAGNESIUM: MAGNESIUM: 2.5 mg/dL (ref 1.6–2.6)

## 2023-04-16 MED ADMIN — acetaminophen (OFIRMEV) 10 mg/mL injection 650 mg 65 mL: 650 mg | INTRAVENOUS | @ 23:00:00 | Stop: 2023-04-17

## 2023-04-16 MED ADMIN — methocarbamol (ROBAXIN) 500 mg in sodium chloride (NS) 0.9 % 50 mL IVPB: 500 mg | INTRAVENOUS | @ 06:00:00 | Stop: 2023-04-16

## 2023-04-16 MED ADMIN — sodium phosphate 30 mmol in dextrose 5 % 250 mL IVPB: 30 mmol | INTRAVENOUS | @ 14:00:00 | Stop: 2023-04-16

## 2023-04-16 MED ADMIN — enoxaparin (LOVENOX) syringe 40 mg: 40 mg | SUBCUTANEOUS | @ 11:00:00 | Stop: 2023-04-16

## 2023-04-16 MED ADMIN — morphine injection 2 mg: 2 mg | INTRAVENOUS | @ 19:00:00

## 2023-04-16 MED ADMIN — methocarbamol (ROBAXIN) 500 mg in sodium chloride (NS) 0.9 % 50 mL IVPB: 500 mg | INTRAVENOUS | @ 14:00:00 | Stop: 2023-04-17

## 2023-04-16 MED ADMIN — lactated Ringers infusion: 0-100 mL/h | INTRAVENOUS

## 2023-04-16 MED ADMIN — rifAXIMin (XIFAXAN) tablet 550 mg: 550 mg | ORAL | @ 14:00:00 | Stop: 2023-04-26

## 2023-04-16 MED ADMIN — lactated ringers bolus 300 mL: 300 mL | INTRAVENOUS | @ 02:00:00

## 2023-04-16 MED ADMIN — rifAXIMin (XIFAXAN) tablet 550 mg: 550 mg | ORAL | @ 02:00:00 | Stop: 2023-04-17

## 2023-04-16 MED ADMIN — lactated ringers bolus 500 mL: 500 mL | INTRAVENOUS | @ 14:00:00 | Stop: 2023-04-16

## 2023-04-16 MED ADMIN — lactulose enema: 200 g | RECTAL | @ 17:00:00 | Stop: 2023-04-16

## 2023-04-16 MED ADMIN — methocarbamol (ROBAXIN) 500 mg in sodium chloride (NS) 0.9 % 50 mL IVPB: 500 mg | INTRAVENOUS | @ 22:00:00 | Stop: 2023-04-17

## 2023-04-16 MED ADMIN — lactated Ringers infusion: 50 mL/h | INTRAVENOUS | @ 02:00:00

## 2023-04-16 NOTE — Unmapped (Signed)
PHYSICAL THERAPY  Evaluation (04/16/23 1115)          Patient Name:  Thomas Rosales [facility]       Medical Record Number: [id number]   Date of Birth: Mar 18, 1941  Sex: Male        Post-Discharge Physical Therapy Recommendations:  PT Post Acute Discharge Recommendations: 3x weekly, Community Ambulator   PT DME Recommendations: None           Treatment Diagnosis: DEcreased endurance     Activity Tolerance: Tolerated treatment well     ASSESSMENT  Problem List: Decreased endurance, Decreased mobility      Assessment : Thomas Rosales is a 82 y.o. 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 Since then, patient has had recurrent SBOs managed non-operatively. He was admitted on  5/4 for SBO and lack of ostomy output. The patient is being managed non-operatively. per EPIC. Pt with above medical problems impacting functional mobility. Pt will benefit from continued acute and post acute 3x community to improve endurance.      Today's Interventions: Education: PT POC/ role, continued mobility, up wtih assistance.     Personal Factors/Comorbidities Present: 3+   Examination of Body System: Musculoskeletal, Activity/participation  Clinical Presentation: Evolving    Clinical Decision Making: Moderate        PLAN  Planned Frequency of Treatment:  1-2x per day for: 3-4x week Planned Treatment Duration: 04/30/23     Planned Interventions: Education (Patient/Family/Caregiver), Therapeutic Exercise, Gait training, Therapeutic Activity     Goals:   Patient and Family Goals: To get strength back     SHORT GOAL #1: Pt will complete all functional transfers with mod I and LRAD.               Time Frame : 2 weeks  SHORT GOAL #2: Pt will ambulate 300 ft with mod I and LRAD.              Time Frame : 2 weeks  SHORT GOAL #3: Pt will be independent with HEP.              Time Frame : 2 weeks                                                    Prognosis: Good  Positive Indicators: PLOF, famiyl support  Barriers to Discharge: None     SUBJECTIVE  Patient reports: Pt agreeable to PT.        Prior Functional Status: Pt is independent with mobility. Pt uses cane as needed.  Equipment available at home: Rollator, Straight cane        Past Medical History:   Diagnosis Date    Anal fissure 1983    Apnea, sleep     uses CPAP at night. instructed to bring DOS.     Basal cell carcinoma     Cancer (CMS-HCC)     skin cancer    CHF (congestive heart failure) (CMS-HCC)     Cirrhosis (CMS-HCC) 04/15/2023    Crohn's disease (CMS-HCC)     Diverticulitis of colon     Fatty liver     Gallstones     GERD (gastroesophageal reflux disease)     H/O echocardiogram     Hypertension  Irritable bowel syndrome     Kidney stone     NASH (nonalcoholic steatohepatitis)     Syncope     dizziness    Type 2 diabetes mellitus, without long-term current use of insulin (CMS-HCC) 10/24/2022            Social History     Tobacco Use    Smoking status: Former     Current packs/day: 0.00     Average packs/day: 1 pack/day for 20.0 years (20.0 ttl pk-yrs)     Types: Cigarettes     Start date: 12/09/1964     Quit date: 12/09/1984     Years since quitting: 38.3    Smokeless tobacco: Never   Substance Use Topics    Alcohol use: Not Currently     Comment: occassionally       Past Surgical History:   Procedure Laterality Date    APPENDECTOMY      BOWEL RESECTION  1981, 1984, 1995    3 ft small intestine, <1 ft large intestines, cecum removed; 2nd bowel resection anal fistulas removed; 3rd bowel resection most of colon removed, colostomy placed.    CARDIAC CATHETERIZATION      CARDIOVASCULAR STRESS TEST      CHOLECYSTECTOMY      COLON SURGERY      Same as small bowel surgery    COLOSTOMY  1995    EYE SURGERY  1/20 & 4/21    Cataract & vitrious removal    FRACTURE SURGERY  1961    Broken tibia and fibula    HERNIA REPAIR  2019    Done by Dr Day    LAPAROSCOPIC SMALL BOWEL RESECTION      LEG SURGERY Left     pins in shin    LITHOTRIPSY      PR COLONOSCOPY FLX DX W/COLLJ SPEC WHEN PFRMD  03/19/2018    Procedure: COLONOSCOPY WITH POSSIBLE BIOPSY, POSSIBLE POLYPECTOMY.;  Surgeon: Tressie Ellis, MD;  Location: ENDO OR [facility];  Service: Gastroenterology    PR COLSC FLEXIBLE W/TRANSENDOSCOPIC BALLOON DILAT N/A 09/13/2022    Procedure: COLONOSCOPY, FLEXIBLE; WITH DILATION BY BALLOON, 1 OR MORE STRICTURES;  Surgeon: Luanne Bras, MD;  Location: [facility];  Service: Gastroenterology    PR INCISION & DRAINAGE ABSCESS SIMPLE/SINGLE N/A 08/11/2018    Procedure: INCISION AND DRAINAGE OF ABDOMINAL WALL ABSCESS;  Surgeon: Jerry Caras Day, MD;  Location: OR [place];  Service: General Surgery    PR LAP, INCISIONAL HERNIA REPAIR,REDUCIBLE N/A 06/02/2018    Procedure: LAPAROSCOPIC ASSISTED PARASTOMAL HERNIA REPAIR AND INCISIONAL HERNIA REPAIR WITH MESH;  Surgeon: Jerry Caras Day, MD;  Location: OR [place];  Service: General Surgery    PR VITRECTOMY,MECHANICAL Right 03/28/2020    Procedure: trans pars planar vitrectomy, right eye   ;  Surgeon: Emi Holes, MD;  Location: [facility] OR ;  Service: Ophthalmology    PR XCAPSL CTRC RMVL INSJ IO LENS PROSTH W/O ECP Right 01/13/2019    Procedure: PHACOEMULSIFICATION OF CATARACT WITH INSERTION OF INTRAOCULAR LENS/TORIC LENS/ORA;  Surgeon: Juanna Cao, MD;  Location: OR [facility]  Service: Ophthalmology    PR XCAPSL CTRC RMVL INSJ IO LENS PROSTH W/O ECP Left 01/27/2019    Procedure: PHACOEMULSIFICATION OF CATARACT WITH INSERTION OF INTRAOCULAR LENS/TORIC LENS/ORA;  Surgeon: Juanna Cao, MD;  Location: OR [facility];  Service: Ophthalmology    RESECTION SMALL BOWEL / CLOSURE ILEOSTOMY  1981    temporary ileostomy     REVISION COLOSTOMY  2009    SMALL INTESTINE SURGERY  3x since 1984    Crohn???s disease    URETERAL STENT PLACEMENT  2008             Family History   Problem Relation Age of Onset    Heart disease Mother         pacemaker    Arthritis Mother     Dementia Father Cancer Father         Allergies: Patient has no known allergies.                  Objective Findings  Precautions / Restrictions  Weight Bearing Status: Non-applicable  Required Braces or Orthoses: Non-applicable     Communication Preference: Verbal          Pain Comments: Reports pressure in stomach, activity adjusted per tolerance.  Medical Tests / Procedures: Imaging and labs reivewed  Equipment / Environment: Vascular access (PIV, TLC, Port-a-cath, PICC), NGT, Colostomy     Vitals/Orthostatics : VSS per EPIC.     Living Situation  Living Environment: House  Lives With: Spouse  Home Living: One level home, Tub/shower unit, Shower chair with back, Handicapped height toilet, Stairs to enter without rails  Number of Stairs to Enter (outside): 1  Caregiver Identified?: Yes  Caregiver Availability: 24 hours  Caregiver Ability: Limited lifting      Cognition: WFL           Skin Inspection: Intact where visualized     Upper Extremities  UE ROM: Right WFL, Left WFL  UE Strength: Left WFL, Right WFL    Lower Extremities  LE ROM: Right WFL, Left WFL  LE Strength: Left WFL, Right WFL               Static Sitting-Level of Assistance: Standby assistance  Dynamic Sitting-Level of Assistance: Standby Surveyor, mining of Assistance: Stand by assistance  Dynamic Standing - Level of Assistance: Stand by assistance      Bed Mobility: Supine to Sit  Supine to Sit assistance level: Standby assist, set-up cues, supervision of patient - no hands on     Transfers: Sit to Stand  Sit to Stand assistance level: Standby assist, set-up cues, supervision of patient - no hands on      Gait Level of Assistance: Standby assist, set-up cues, supervision of patient - no hands on  Gait Assistive Device: Rolling walker  Gait Distance Ambulated (ft): 150 ft  Skilled Treatment Performed: Pt ambulated with step through pattern and slow cadence.                  Endurance: Fair    Patient at end of session: All needs in reach, In chair, Friends/Family present, Nurse notified, Lines intact    Physical Therapy Session Duration  PT Co-Treatment [mins]: 27 (SAra T, OT)  Reason for Co-treatment: Poor activity tolerance, To safely progress mobility          I attest that I have reviewed the above information.  Signed: Fransico Setters, PT  Filed 04/16/2023

## 2023-04-16 NOTE — Unmapped (Signed)
OCCUPATIONAL THERAPY  Evaluation (04/16/23 1116)    Patient Name:  Thomas Rosales [facility]       Medical Record Number: [id number]   Date of Birth: 12/31/1940  Sex: Male      Post-Discharge Occupational Therapy Recommendations:   3x weekly     OT DME Recommendations: None -              OT Treatment Diagnosis:  Pt. presents w/ deconditioning and dec activity tolerance impacting ADL performance    Assessment  Problem List: Decreased endurance, Impaired ADLs, Decreased strength, Pain, Decreased activity tolerance    Clinical Decision Making: Moderate Complexity    Assessment: 82 y.o. 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 Since then, patient has had recurrent SBOs managed non-operatively. He was admitted on  5/4 for SBO and lack of ostomy output. The patient is being managed non-operatively. Consideration of pt's occupational profile, assessment review, level of clinical decision making involved, and intervention plan, indicates pt is currently a moderate complexity case, presenting to OT with dec activity tolerance and deconditioning impacting ADL performance. This indicates pt is appropriate for post-acute OT 3x/wk.  Recommend acute OT 2-3x/wk to maximize safety and functional independence.    Today's Interventions: Balance activities, Bed mobility, ADL retraining, Endurance activities, Education - Patient, Safety education, Transfer training, Functional mobility      Today's Interventions: Pt. ed on role of OT/POC. SBA supine w/ HOB elevated to EOB, pt. able to donn slide on shoes seated EOB, SBA STS + RW, complete functional mob in hallway, CGA donn clean gown seated for lines/leads, and SBA transfer into chair.    Activity Tolerance During Today's Session       Plan  Planned Frequency of Treatment:  1-2x per day for: 2-3x week  Planned Treatment Duration: 05/07/23    Planned Interventions:  Education (Patient/Family/Caregiver), Self-Care/Home Training, Therapeutic Activity      GOALS:        Short Term:  SHORT GOAL #1: Pt. will complete full body dressing including clothing retrieval mod I + LRAD   Time Frame : 1 week      SHORT GOAL #2: Pt. will tol 8+ minutes standing ADLs mod I + LRAD   Time Frame : 1 week      Prognosis:  Good    Barriers to Discharge: None    Subjective  Medical Updates Since Last Visit:    Prior Functional Status Pt. reports ind occ use of SPC at baseline            Patient / Caregiver reports: 'I've not done much since November    Past Medical History:   Diagnosis Date    Anal fissure 1983    Apnea, sleep     uses CPAP at night. instructed to bring DOS.     Basal cell carcinoma     Cancer (CMS-HCC)     skin cancer    CHF (congestive heart failure) (CMS-HCC)     Cirrhosis (CMS-HCC) 04/15/2023    Crohn's disease (CMS-HCC)     Diverticulitis of colon     Fatty liver     Gallstones     GERD (gastroesophageal reflux disease)     H/O echocardiogram     Hypertension     Irritable bowel syndrome     Kidney stone     NASH (nonalcoholic steatohepatitis)     Syncope  dizziness    Type 2 diabetes mellitus, without long-term current use of insulin (CMS-HCC) 10/24/2022    Social History     Tobacco Use    Smoking status: Former     Current packs/day: 0.00     Average packs/day: 1 pack/day for 20.0 years (20.0 ttl pk-yrs)     Types: Cigarettes     Start date: 12/09/1964     Quit date: 12/09/1984     Years since quitting: 38.3    Smokeless tobacco: Never   Substance Use Topics    Alcohol use: Not Currently     Comment: occassionally      Past Surgical History:   Procedure Laterality Date    APPENDECTOMY      BOWEL RESECTION  1981, 1984, 1995    3 ft small intestine, <1 ft large intestines, cecum removed; 2nd bowel resection anal fistulas removed; 3rd bowel resection most of colon removed, colostomy placed.    CARDIAC CATHETERIZATION      CARDIOVASCULAR STRESS TEST      CHOLECYSTECTOMY      COLON SURGERY      Same as small bowel surgery    COLOSTOMY  1995    EYE SURGERY  1/20 & 4/21    Cataract & vitrious removal    FRACTURE SURGERY  1961    Broken tibia and fibula    HERNIA REPAIR  2019    Done by Dr Day    LAPAROSCOPIC SMALL BOWEL RESECTION      LEG SURGERY Left     pins in shin    LITHOTRIPSY      PR COLONOSCOPY FLX DX W/COLLJ SPEC WHEN PFRMD  03/19/2018    Procedure: COLONOSCOPY WITH POSSIBLE BIOPSY, POSSIBLE POLYPECTOMY.;  Surgeon: Tressie Ellis, MD;  Location: ENDO OR [facility];  Service: Gastroenterology    PR COLSC FLEXIBLE W/TRANSENDOSCOPIC BALLOON DILAT N/A 09/13/2022    Procedure: COLONOSCOPY, FLEXIBLE; WITH DILATION BY BALLOON, 1 OR MORE STRICTURES;  Surgeon: Luanne Bras, MD;  [facility]: [facility];  Service: Gastroenterology    PR INCISION & DRAINAGE ABSCESS SIMPLE/SINGLE N/A 08/11/2018    Procedure: INCISION AND DRAINAGE OF ABDOMINAL WALL ABSCESS;  Surgeon: Jerry Caras Day, MD;  Location: OR [place];  Service: General Surgery    PR LAP, INCISIONAL HERNIA REPAIR,REDUCIBLE N/A 06/02/2018    Procedure: LAPAROSCOPIC ASSISTED PARASTOMAL HERNIA REPAIR AND INCISIONAL HERNIA REPAIR WITH MESH;  Surgeon: Jerry Caras Day, MD;  Location: OR [place];  Service: General Surgery    PR VITRECTOMY,MECHANICAL Right 03/28/2020    Procedure: trans pars planar vitrectomy, right eye   ;  Surgeon: Emi Holes, MD;  Location: [facility] OR [place];  Service: Ophthalmology    PR XCAPSL CTRC RMVL INSJ IO LENS PROSTH W/O ECP Right 01/13/2019    Procedure: PHACOEMULSIFICATION OF CATARACT WITH INSERTION OF INTRAOCULAR LENS/TORIC LENS/ORA;  Surgeon: Juanna Cao, MD;  Location: OR [facility];  Service: Ophthalmology    PR XCAPSL CTRC RMVL INSJ IO LENS PROSTH W/O ECP Left 01/27/2019    Procedure: PHACOEMULSIFICATION OF CATARACT WITH INSERTION OF INTRAOCULAR LENS/TORIC LENS/ORA;  Surgeon: Juanna Cao, MD;  Location: OR [facility];  Service: Ophthalmology    RESECTION SMALL BOWEL / CLOSURE ILEOSTOMY  1981    temporary ileostomy     REVISION COLOSTOMY  2009    SMALL INTESTINE SURGERY  3x since 1984    Crohn???s disease    URETERAL STENT PLACEMENT  2008    Family History   Problem Relation  Age of Onset    Heart disease Mother         pacemaker    Arthritis Mother     Dementia Father     Cancer Father         Patient has no known allergies.     Objective Findings  Precautions / Restrictions          Weight Bearing  Non-applicable    Required Braces or Orthoses  Non-applicable    Communication Preference  Verbal    Pain  Pt. reporting pressure in abdomine, activity adjusted per tolerance    Equipment / Environment  Vascular access (PIV, TLC, Port-a-cath, PICC), NGT, Colostomy    Living Situation  Living Environment: House  Lives With: Spouse  Home Living: One level home, Tub/shower unit, Shower chair with back, Handicapped height toilet, Stairs to enter without rails  Number of Stairs to Enter (outside): 1  Caregiver Identified?: Yes  Caregiver Availability: 24 hours  Caregiver Ability: Limited lifting  Equipment available at home: Rollator, Straight cane     Cognition   Orientation Level:  Oriented x 4   Arousal/Alertness:  Appropriate responses to stimuli   Attention Span:  Appears intact   Memory:  Appears intact   Following Commands:  Follows all commands and directions without difficulty   Safety Judgment:  Good awareness of safety precautions   Awareness of Errors and Problem Solving:  Assistance required to identify errors made   Comments:      Vision / Hearing   Vision: No acute deficits identified     Hearing: No deficit identified         Hand Function:  Right Hand Function: Right hand grip strength, ROM and coordination WNL  Left Hand Function: Left hand grip strength, ROM and coordination WNL    Skin Inspection:  Skin Inspection: Intact where visualized    ROM / Strength:  UE ROM/Strength: Left WFL, Right WFL  LE ROM/Strength: Left WFL, Right WFL  LE ROM/ Strength Comment: deconditioning    Coordination:  Coordination: WFL    Sensation:  Sensory/ Proprioception/ Stereognosis comments: WFL    Balance:  Sitting Balance comments: SBA    Static Standing-Level of Assistance: Stand by assistance  Dynamic Standing - Level of Assistance: Stand by assistance    Functional Mobility  Transfers: Standby assist  Bed Mobility - Needs Assistance: Standby assist  Ambulation: SBA + RW    ADLs  ADLs: Needs assistance with ADLs  ADLs - Needs Assistance: LB dressing, Toileting, Bathing  Bathing - Needs Assistance: Min assist  Toileting - Needs Assistance: Min assist  LB Dressing - Needs Assistance: Contact Guard assist    Vitals / Orthostatics  Vitals/Orthostatics: NAD         Patient at end of session: All needs in reach, In chair, Friends/Family present, Lines intact, Nurse notified     Occupational Therapy Session Duration  OT Co-Treatment [mins]: 27 Bryna Colander PT)  Reason for Co-treatment: Poor activity tolerance         AM-PAC-Daily Activity  Lower Body Dressing assistance needs: A Little - Minimal/Contact Guard Assist/Supervision  Bathing assistance needs: A Little - Minimal/Contact Guard Assist/Supervision  Toileting assistance needs: A Little - Minimal/Contact Guard Assist/Supervision  Upper Body Dressing assistance needs: None - Modified Independent/Independent  Personal Grooming assistance needs: None - Modified Independent/Independent  Eating Meals assistance needs: None - Modified Independent/Independent    Daily Activity Score:  Daily Activity Score: 21    Score (in  points): % of Functional Impairment, Limitation, Restriction  6: 100% impaired, limited, restricted  7-8: At least 80%, but less than 100% impaired, limited restricted  9-13: At least 60%, but less than 80% impaired, limited restricted  14-19: At least 40%, but less than 60% impaired, limited restricted  20-22: At least 20%, but less than 40% impaired, limited restricted  23: At least 1%, but less than 20% impaired, limited restricted  24: 0% impaired, limited restricted      I attest that I have reviewed the above information.  Signed: Jeoffrey Massed, OT  Ceasar Mons 04/16/2023

## 2023-04-16 NOTE — Unmapped (Cosign Needed)
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 Since then, patient has had recurrent SBOs managed non-operatively. He was admitted on  5/4 for SBO and lack of ostomy output. The patient is being managed non-operatively.    Subjective   Patient required albumin bolus ON for low MAPs. Required inc morphine dose for abdominal pain. AF, HDS. NGT output 4.1L. Cr 1.64 from 1.17. BUN/Cr > 20.  Patient complaining of worsening confusion this am.     Plan   Neuro/Psych:  - IV pain, nausea medications as needed    CV/Pulm:  - HDS, oxygenating well on RA    FEN/GI:  - IVF: LR 50 ml, will bolus 1:05 NGT output: IVFs    - per hepatology okay to use crystalloid IVF given improvement in albumin   - 50g albumin this am, will DC 25% albumin BID after this morning   - diet: NPO Sips with meds; Medically necessary   - NGT LIWS  - replete lytes prn     #enterocutaneous fistula  - GI luminal c/s, appreciate recs     *Hx of Cirrhosis  - Appreciate GI recsfor Hx o   - Discontinue Rifaximin iso lack of absorption given SBO   - Lactulose per Colostomy     Renal/GU:  - voiding spontaneously    #AKI  - likely pre-renal  - replete IVF losses as above     Endo:  - SSI, POCT glucose checks    Heme/ID:  - H&H stable, no active signs/symptoms of bleeding  - Trend WBC     Prophylaxis: prophylactic Lovenox, SCDs, out of bed/ambulate TID, IS, PPI  - patient/family learning Lovenox injections for home, given inflammatory bowel disease history and increased VTE risk    Access: peripheral IV    Dispo: floor status    Objective   Vital signs over the last 24 hours:  Temp:  [36.4 ??C (97.5 ??F)-36.9 ??C (98.4 ??F)] 36.8 ??C (98.2 ??F)  Heart Rate:  [79-99] 86  Resp:  [16-18] 16  BP: (106-129)/(54-67) 106/54  MAP (mmHg):  [69-80] 69  SpO2:  [95 %-98 %] 95 %    Physical Exam:  Gen: alert, in mild distress  CV: regular rate, normotensive  Pulm: breathing comfortably on room air  Abd: moderately distended, and appropriately tender to palpation.  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  Colostomy stoma pink, moist, nonprolapsed. Ostomy bag with some brown liquid output.  Ext: warm and well perfused bilaterally, no edema     Marcene Duos, MD, MPH  PGY-1

## 2023-04-16 NOTE — Unmapped (Signed)
Problem: Adult Inpatient Plan of Care  Goal: Plan of Care Review  Outcome: Ongoing - Unchanged  Flowsheets (Taken 04/15/2023 1939)  Plan of Care Reviewed With: patient  Goal: Patient-Specific Goal (Individualized)  Outcome: Ongoing - Unchanged  Goal: Absence of Hospital-Acquired Illness or Injury  Outcome: Ongoing - Unchanged  Intervention: Identify and Manage Fall Risk  Recent Flowsheet Documentation  Taken 04/15/2023 0800 by Aarin Bluett A, RN  Safety Interventions:   fall reduction program maintained   low bed   nonskid shoes/slippers when out of bed  Intervention: Prevent Skin Injury  Recent Flowsheet Documentation  Taken 04/15/2023 1800 by Cambreigh Dearing A, RN  Positioning for Skin: Supine/Back  Taken 04/15/2023 1600 by Stacy Sailer A, RN  Positioning for Skin: Supine/Back  Taken 04/15/2023 1400 by Ayad Nieman, Doristine Section, RN  Positioning for Skin: Sitting in Chair  Taken 04/15/2023 0800 by Phyllis Whitefield A, RN  Positioning for Skin: Supine/Back  Device Skin Pressure Protection: absorbent pad utilized/changed  Goal: Optimal Comfort and Wellbeing  Outcome: Ongoing - Unchanged  Goal: Readiness for Transition of Care  Outcome: Ongoing - Unchanged  Goal: Rounds/Family Conference  Outcome: Ongoing - Unchanged     Problem: Fall Injury Risk  Goal: Absence of Fall and Fall-Related Injury  Outcome: Ongoing - Unchanged  Intervention: Promote Injury-Free Environment  Recent Flowsheet Documentation  Taken 04/15/2023 0800 by Lesa Vandall A, RN  Safety Interventions:   fall reduction program maintained   low bed   nonskid shoes/slippers when out of bed   Plan of care reviewed at beginning of shift and as needed.No falls this shift.Out of bed to chair. VS stable.Remains NPO .NGT remains in place large amount drainage noted.Verbalized adequate pain relief with current pain regimen.Urine output adequate.Watery stool noted from colostomy,foul smelling.Patient to have lactulose enema via stoma.Anticoagulant given as prescribed.All questions answered at this time .Will continue to monitor.

## 2023-04-16 NOTE — Unmapped (Signed)
Hepatology Consult Service   Initial Consultation         Assessment and Recommendations:   Thomas Rosales is a 82 y.o. 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 The patient is seen in consultation at the request of Thomas Lull, MD ([facility])) for decompensated cirrhosis.    Decompensated MASLD Cirrhosis, Mild AKI: [name] appears to be near his baseline in regards to his liver disease. 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  Patient has developed a worsened AKI with Cr of 1.6. Albumin is 4.7 following albumin dosing.  -- Hold diuretics in setting of NPO status  -- S/p 50g of 25% albumin today and 500cc bolus  -- Ok with isotonic slow boluses for GI losses per surgical team, depending on albumin trend as well.  -- On mIVF 50 ml/hr  -- Strict I/O  -- Daily weight  -- Continue rifaximin 550mg  BID  -- Limit acetaminophen use to </=2g/day  -- If unable to take PO medicines could consider lactulose enema although anatomy makes this challenging.   -- Luminal has also been consulted for bowel obstruction management    VOCAL-[facility] Scores: Operative risk in patients with cirrhosis. This is for planned surgery, numbers would be higher for emergency sugery. Lap Abdominal: 4.9% 30-day mortality, 20.6% 180-day mortality, 23.4% post-op 90-day decompensation. Open Abdominal: 19.7% 30-day mortality, 52.3% 90-day mortality, 36% post-op 90-day decompensation    Issues Impacting Complexity of Management:  -None    Recommendations discussed with the patient's primary team. We will sign-off at this time, please re-contact if additional questions or a new need for consultation arises.    Subjective:   Patient with continued abdominal pain and feels dehydrated.    -I have reviewed the patient's prior records from [facility] visit as summarized in the HPI    Objective:   Temp:  [36.4 ??C (97.5 ??F)-36.9 ??C (98.4 ??F)] 36.8 ??C (98.2 ??F)  Heart Rate:  [79-99] 86  Resp:  [16-18] 16  BP: (106-129)/(54-67) 106/54  SpO2:  [95 %-98 %] 95 %    Gen: Chronically ill-appearing male in NAD, answers questions appropriately  Eyes: Sclera anicteric  Abdomen: Distended, tender abdomen  Extremities: No edema in the BLEs  Neuro: Normal speech. No asterixis.   Psych: Alert, normal mood and affect.     Pertinent Labs/Studies:  -I have visualized the patient's CT dated 5/4 which shows multiple dilated loops of bowel, no appreciable ascites  -I have reviewed the patient's labs from 5/7 which show stable Hgb, worsened Cr, and stable LFTs

## 2023-04-16 NOTE — Unmapped (Signed)
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 Since then, patient has had recurrent SBOs managed non-operatively. He was admitted on  5/4 for SBO and lack of ostomy output. The patient is being managed non-operatively.    Subjective   NAEON. Patient feeling much more clear cognitively after lactulose per patient and daughter. AF, HDS. UOP low but improved w/ bolus ON. NGT output downtrended to 2.8L.   Plan   Neuro/Psych:  -Morphine 2 mg every 3, Robaxin    CV/Pulm:  - HDS, oxygenating well on RA    #CHF  - echo 11/16/22: LVEF is visually estimated at 55%    FEN/GI:  - IVF: LR 50 ml, will bolus 1:05 NGT output: IVFs    - per hepatology okay to use crystalloid IVF given improvement in albumin  - diet: NPO Sips with meds; Medically necessary   - NGT LIWS  - replete lytes prn     #enterocutaneous fistula  - GI luminal c/s, appreciate recs     *Hx of Cirrhosis  - Appreciate GI recsfor Hx o   - rifaximin    - Lactulose per Colostomy     Renal/GU:  - voiding spontaneously    #AKI  - likely pre-renal  - replete IVF losses as above     Endo:  - SSI, POCT glucose checks    Heme/ID:  - H&H stable, no active signs/symptoms of bleeding  - Trend WBC Prophylaxis: prophylactic Lovenox, SCDs, out of bed/ambulate TID, IS, PPI  - patient/family learning Lovenox injections for home, given inflammatory bowel disease history and increased VTE risk    Access: peripheral IV    Dispo: floor status    Objective   Vital signs over the last 24 hours:  Temp:  [36.6 ??C (97.9 ??F)-36.8 ??C (98.2 ??F)] 36.7 ??C (98 ??F)  Heart Rate:  [79-90] 79  Resp:  [16-18] 17  BP: (106-129)/(54-63) 127/55  MAP (mmHg):  [69-80] 73  SpO2:  [95 %-98 %] 97 %    Physical Exam:  Gen: alert, in mild distress  CV: regular rate, normotensive  Pulm: breathing comfortably on room air  Abd: moderately distended, and appropriately tender to palpation.  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  Colostomy stoma pink, moist, nonprolapsed. Ostomy bag with some brown liquid output.  Ext: warm and well perfused bilaterally, no edema     Marcene Duos, MD, MPH  PGY-1

## 2023-04-16 NOTE — Unmapped (Signed)
Adult Nutrition Assessment Note    Visit Type: RN Consult  Reason for Visit: Per Admission Nutrition Screen (Adult)      HPI & PMH:  82 y.o. 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 Since then, patient has had recurrent SBOs managed non-operatively. He was admitted on  5/4 for SBO and lack of ostomy output. The patient is being managed non-operatively.     Anthropometric Data:  Height: 167.6 cm (5' 6)   Admission weight: 70.3 kg (155 lb)  Last recorded weight: 70.3 kg (155 lb)  IBW: 64.49 kg  Percent IBW: 109.02 %  BMI: Body mass index is 25.02 kg/m??.   Usual Body Weight:  155-160 lb    Weight history prior to admission:  no significant weight loss trends PTA (fluid related weight increase 11-11/2022)    Wt Readings from Last 10 Encounters:   04/12/23 70.3 kg (155 lb)   04/07/23 72.8 kg (160 lb 6.4 oz)   02/05/23 68.8 kg (151 lb 9.6 oz)   12/30/22 72.6 kg (160 lb)   11/28/22 71.9 kg (158 lb 9.6 oz)   11/22/22 79.7 kg (175 lb 11.2 oz)   11/11/22 84.4 kg (186 lb)   11/06/22 84 kg (185 lb 3.2 oz)   10/23/22 72.8 kg (160 lb 8 oz)   10/16/22 74.8 kg (164 lb 12.8 oz)        Weight changes this admission:   Last 5 Recorded Weights    04/12/23 1547   Weight: 70.3 kg (155 lb)        Nutrition Focused Physical Exam:  Unable to complete at this time due to patient request (deferred)      NUTRITIONALLY RELEVANT DATA     Medications:   Nutritionally pertinent medications reviewed and evaluated for potential food and/or medication interactions and include LR at 100 ml/hr  and sodium phosphate 30 mmol      Labs:   Nutritionally pertinent labs reviewed and include Phosphorus: 2.6 mg/dL and Cr 1.61    Nutrition History:   Apr 16, 2023: Prior to admission:  Spoke with patient and spouse this afternoon. Report last PO intakes were 5/2, prior to this he reports good/fair PO intakes but has been experiencing early satiety over the past year Allergies, Intolerances, Sensitivities, and/or Cultural/Religious Dietary Restrictions: none identified at this time     Current Nutrition:  NPO    Nutrition Orders            NPO Sips with meds; Medically necessary: NPO starting at 05/05 0542              Nutritional Needs:   Healthy balance of carbohydrate, protein, and fat.       Malnutrition assessment not yet completed at this time due to inability to complete nutrition focused physical exam (NFPE).    GOALS and EVALUATION     Patient to remain NPO and/or on a clear liquid diet less than 2 days before diet advancement vs start of nutrition support. - New    Motivation, Barriers, and Compliance:  Evaluation of motivation, barriers, and compliance completed. No concerns identified at this time.     NUTRITION ASSESSMENT     Current  nutrition therapy is appropriate although not meeting nutritional needs at this time due to NPO status.       Discharge Planning:   Monitor for potential discharge needs with multi-disciplinary team.  Was the nutrition care plan completed? No, unable to diagnose malnutrition at this time       NUTRITION INTERVENTIONS and RECOMMENDATION     Advance diet once medically feasible   If unable to tolerate diet advancement within 1-2 days would rec TPN support    Follow-Up Parameters:   1-2 times per week (and more frequent as indicated)    Gladys Damme MS, RD, LD, CNSC  [phone]

## 2023-04-17 LAB — PROTIME-INR
INR: 1.3
PROTIME: 14.4 s — ABNORMAL HIGH (ref 9.9–12.6)

## 2023-04-17 LAB — HEPATIC FUNCTION PANEL
ALBUMIN: 3.7 g/dL (ref 3.4–5.0)
ALKALINE PHOSPHATASE: 103 U/L (ref 46–116)
ALT (SGPT): 19 U/L (ref 10–49)
AST (SGOT): 25 U/L (ref <=34)
BILIRUBIN DIRECT: 0.9 mg/dL — ABNORMAL HIGH (ref 0.00–0.30)
BILIRUBIN TOTAL: 1.8 mg/dL — ABNORMAL HIGH (ref 0.3–1.2)
PROTEIN TOTAL: 7.1 g/dL (ref 5.7–8.2)

## 2023-04-17 LAB — BASIC METABOLIC PANEL
ANION GAP: 7 mmol/L (ref 5–14)
BLOOD UREA NITROGEN: 55 mg/dL — ABNORMAL HIGH (ref 9–23)
BUN / CREAT RATIO: 42
CALCIUM: 8.5 mg/dL — ABNORMAL LOW (ref 8.7–10.4)
CHLORIDE: 109 mmol/L — ABNORMAL HIGH (ref 98–107)
CO2: 28 mmol/L (ref 20.0–31.0)
CREATININE: 1.32 mg/dL — ABNORMAL HIGH
EGFR CKD-EPI (2021) MALE: 55 mL/min/{1.73_m2} — ABNORMAL LOW (ref >=60)
GLUCOSE RANDOM: 91 mg/dL (ref 70–179)
POTASSIUM: 3.6 mmol/L (ref 3.4–4.8)
SODIUM: 144 mmol/L (ref 135–145)

## 2023-04-17 LAB — CBC
HEMATOCRIT: 38.3 % — ABNORMAL LOW (ref 39.0–48.0)
HEMOGLOBIN: 13 g/dL (ref 12.9–16.5)
MEAN CORPUSCULAR HEMOGLOBIN CONC: 33.9 g/dL (ref 32.0–36.0)
MEAN CORPUSCULAR HEMOGLOBIN: 31.5 pg (ref 25.9–32.4)
MEAN CORPUSCULAR VOLUME: 92.9 fL (ref 77.6–95.7)
MEAN PLATELET VOLUME: 9.6 fL (ref 6.8–10.7)
PLATELET COUNT: 214 10*9/L (ref 150–450)
RED BLOOD CELL COUNT: 4.12 10*12/L — ABNORMAL LOW (ref 4.26–5.60)
RED CELL DISTRIBUTION WIDTH: 14.4 % (ref 12.2–15.2)
WBC ADJUSTED: 13.8 10*9/L — ABNORMAL HIGH (ref 3.6–11.2)

## 2023-04-17 LAB — MAGNESIUM: MAGNESIUM: 2.3 mg/dL (ref 1.6–2.6)

## 2023-04-17 LAB — PHOSPHORUS: PHOSPHORUS: 3.1 mg/dL (ref 2.4–5.1)

## 2023-04-17 MED ADMIN — heparin (porcine) 5,000 unit/mL injection 5,000 Units: 5000 [IU] | SUBCUTANEOUS | @ 19:00:00

## 2023-04-17 MED ADMIN — acetaminophen (OFIRMEV) 10 mg/mL injection 650 mg 65 mL: 650 mg | INTRAVENOUS | @ 21:00:00 | Stop: 2023-04-18

## 2023-04-17 MED ADMIN — acetaminophen (OFIRMEV) 10 mg/mL injection 650 mg 65 mL: 650 mg | INTRAVENOUS | @ 13:00:00 | Stop: 2023-04-18

## 2023-04-17 MED ADMIN — morphine injection 2 mg: 2 mg | INTRAVENOUS | @ 10:00:00

## 2023-04-17 MED ADMIN — methocarbamol (ROBAXIN) 500 mg in sodium chloride (NS) 0.9 % 50 mL IVPB: 500 mg | INTRAVENOUS | @ 05:00:00 | Stop: 2023-04-17

## 2023-04-17 MED ADMIN — acetaminophen (OFIRMEV) 10 mg/mL injection 650 mg 65 mL: 650 mg | INTRAVENOUS | @ 04:00:00 | Stop: 2023-04-18

## 2023-04-17 MED ADMIN — lactated Ringers infusion: 0-100 mL/h | INTRAVENOUS | @ 08:00:00

## 2023-04-17 MED ADMIN — rifAXIMin (XIFAXAN) tablet 550 mg: 550 mg | ORAL | @ 01:00:00 | Stop: 2023-04-26

## 2023-04-17 MED ADMIN — heparin (porcine) 5,000 unit/mL injection 5,000 Units: 5000 [IU] | SUBCUTANEOUS | @ 09:00:00

## 2023-04-17 MED ADMIN — lactated Ringers infusion: 50 mL/h | INTRAVENOUS | @ 04:00:00

## 2023-04-17 MED ADMIN — rifAXIMin (XIFAXAN) tablet 550 mg: 550 mg | ORAL | @ 13:00:00 | Stop: 2023-04-26

## 2023-04-17 MED ADMIN — diatrizoate meglumine-sodium (GASTROGRAFIN) 66-10 % solution (37% organically bound iodine) 120 mL: 120 mL | ORAL | @ 16:00:00 | Stop: 2023-04-17

## 2023-04-17 NOTE — Unmapped (Signed)
Problem: Adult Inpatient Plan of Care  Goal: Plan of Care Review  Outcome: Progressing  Flowsheets (Taken 04/16/2023 1829)  Progress: no change  Plan of Care Reviewed With: patient  Goal: Patient-Specific Goal (Individualized)  Outcome: Progressing  Goal: Absence of Hospital-Acquired Illness or Injury  Outcome: Progressing  Intervention: Prevent Skin Injury  Recent Flowsheet Documentation  Taken 04/16/2023 1800 by Jalayla Chrismer A, RN  Positioning for Skin: Supine/Back  Taken 04/16/2023 1200 by Chrishelle Zito A, RN  Positioning for Skin: Sitting in Chair  Taken 04/16/2023 1001 by Mitchell Iwanicki A, RN  Positioning for Skin: Supine/Back  Device Skin Pressure Protection: absorbent pad utilized/changed  Taken 04/16/2023 0800 by Asia Favata A, RN  Positioning for Skin: Supine/Back  Goal: Optimal Comfort and Wellbeing  Outcome: Progressing  Goal: Readiness for Transition of Care  Outcome: Progressing  Goal: Rounds/Family Conference  Outcome: Progressing     Problem: Fall Injury Risk  Goal: Absence of Fall and Fall-Related Injury  Outcome: Progressing   Plan of care reviewed at beginning of shift and as needed.No falls this shift.Out of bed to chair . Seen by PT ambulated in hallway. VS stable.Remains NPO.NGT to LIWS ,output to be replaced by .79mls/1ml every 8hrs.Lactulose enema given via stoma,some fecal matter returned.LR  bolus given this shift.Verbalized adequate pain relief with current pain regimen.Urine output adequate.Anticoagulant given as prescribed.Palliative care consult requested.All questions answered at this time .Will continue to monitor.

## 2023-04-17 NOTE — Unmapped (Signed)
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 Since then, patient has had recurrent SBOs managed non-operatively. He was admitted on  5/4 for SBO and lack of ostomy output. The patient is being managed non-operatively.    Subjective   NAEON. Patient feeling better today, pain is improved. AF, HDS. Adequate UOP. NGT output downtrended to 625. Ostomy with 1125 output with 1 L of it being lactulose.   Plan   Neuro/Psych:  -Morphine 2 mg every 3, Robaxin  - Palliative care consult    CV/Pulm:  - HDS, oxygenating well on RA    #CHF  - echo 11/16/22: LVEF is visually estimated at 55%    FEN/GI:  - IVF: LR 50 ml, will bolus 1:05 NGT output: IVFs    - per hepatology okay to use crystalloid IVF given improvement in albumin  - diet: NPO Sips with meds; Medically necessary   - NGT LIWS  - Gastrografin study today  - replete lytes prn     #enterocutaneous fistula  - GI luminal c/s, appreciate recs     *Hx of Cirrhosis  - Appreciate GI recsfor Hx o   - rifaximin    - Lactulose per Colostomy     Renal/GU:  - voiding spontaneously    #AKI  - likely pre-renal  - replete IVF losses as above     Endo:  - SSI, POCT glucose checks    Heme/ID:  - H&H stable, no active signs/symptoms of bleeding  - Trend WBC     Prophylaxis: prophylactic Lovenox, SCDs, out of bed/ambulate TID, IS, PPI  - patient/family learning Lovenox injections for home, given inflammatory bowel disease history and increased VTE risk    Access: peripheral IV    Dispo: floor status  - PT/OT: 3x weekly    Objective   Vital signs over the last 24 hours:  Temp:  [36.5 ??C (97.7 ??F)-36.7 ??C (98.1 ??F)] 36.5 ??C (97.7 ??F)  Heart Rate:  [68-79] 70  Resp:  [16-18] 17  BP: (124-135)/(53-68) 124/68  MAP (mmHg):  [62-84] 84  SpO2:  [96 %-98 %] 96 %    Physical Exam:  Gen: alert, in mild distress  CV: regular rate, normotensive  Pulm: breathing comfortably on room air  Abd: mildly distended, and appropriately tender to palpation.  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  Colostomy stoma pink, moist, nonprolapsed. Ostomy bag with some brown mushy output. NGT tube suctioning greenish content.  Ext: warm and well perfused bilaterally, no edema     [name], MS4    I attest that I have reviewed the medical student note and that the components of the history  of the present illness, the physical exam, and the assessment and plan documented were performed by me or were performed in my presence by the student where I verified the documentation and performed (or re-performed) the exam and medical decision making.     Marcene Duos, MD, MPH  PGY-1

## 2023-04-17 NOTE — Unmapped (Signed)
POC reviewed with the patient. PT admitted with SBO. NGT to LIWS. Q4 30mL flushes to NGT. Patient is A/Ox4. NAD. Patient complaints of pain are controlled by pain medication.  Patient remains afebrile and exhibits no signs and symptoms of infection. Patient voiding and output has been good. Patient is NPO with ice chips. Patient is 1 assist when out of bed and has remained free from injury. Bed in low/locked position. Call bell in reach. Will Continue to monitor     Problem: Adult Inpatient Plan of Care  Goal: Plan of Care Review  Outcome: Progressing  Goal: Patient-Specific Goal (Individualized)  Outcome: Progressing  Goal: Absence of Hospital-Acquired Illness or Injury  Outcome: Progressing  Intervention: Prevent and Manage VTE (Venous Thromboembolism) Risk  Recent Flowsheet Documentation  Taken 04/16/2023 1942 by Baron Sane, RN  Anti-Embolism Intervention: Refused  Goal: Optimal Comfort and Wellbeing  Outcome: Progressing  Goal: Readiness for Transition of Care  Outcome: Progressing  Goal: Rounds/Family Conference  Outcome: Progressing     Problem: Fall Injury Risk  Goal: Absence of Fall and Fall-Related Injury  Outcome: Progressing

## 2023-04-18 LAB — HEPATIC FUNCTION PANEL
ALBUMIN: 3.7 g/dL (ref 3.4–5.0)
ALKALINE PHOSPHATASE: 122 U/L — ABNORMAL HIGH (ref 46–116)
ALT (SGPT): 18 U/L (ref 10–49)
AST (SGOT): 28 U/L (ref <=34)
BILIRUBIN DIRECT: 0.8 mg/dL — ABNORMAL HIGH (ref 0.00–0.30)
BILIRUBIN TOTAL: 1.7 mg/dL — ABNORMAL HIGH (ref 0.3–1.2)
PROTEIN TOTAL: 7.4 g/dL (ref 5.7–8.2)

## 2023-04-18 LAB — MAGNESIUM: MAGNESIUM: 2.5 mg/dL (ref 1.6–2.6)

## 2023-04-18 LAB — CBC
HEMATOCRIT: 40.4 % (ref 39.0–48.0)
HEMOGLOBIN: 13.5 g/dL (ref 12.9–16.5)
MEAN CORPUSCULAR HEMOGLOBIN CONC: 33.4 g/dL (ref 32.0–36.0)
MEAN CORPUSCULAR HEMOGLOBIN: 31 pg (ref 25.9–32.4)
MEAN CORPUSCULAR VOLUME: 92.8 fL (ref 77.6–95.7)
MEAN PLATELET VOLUME: 9.5 fL (ref 6.8–10.7)
PLATELET COUNT: 216 10*9/L (ref 150–450)
RED BLOOD CELL COUNT: 4.36 10*12/L (ref 4.26–5.60)
RED CELL DISTRIBUTION WIDTH: 14.4 % (ref 12.2–15.2)
WBC ADJUSTED: 11.7 10*9/L — ABNORMAL HIGH (ref 3.6–11.2)

## 2023-04-18 LAB — BASIC METABOLIC PANEL
ANION GAP: 9 mmol/L (ref 5–14)
BLOOD UREA NITROGEN: 56 mg/dL — ABNORMAL HIGH (ref 9–23)
BUN / CREAT RATIO: 46
CALCIUM: 8.8 mg/dL (ref 8.7–10.4)
CHLORIDE: 108 mmol/L — ABNORMAL HIGH (ref 98–107)
CO2: 28 mmol/L (ref 20.0–31.0)
CREATININE: 1.22 mg/dL — ABNORMAL HIGH
EGFR CKD-EPI (2021) MALE: 60 mL/min/{1.73_m2} (ref >=60)
GLUCOSE RANDOM: 81 mg/dL (ref 70–179)
POTASSIUM: 3.6 mmol/L (ref 3.4–4.8)
SODIUM: 145 mmol/L (ref 135–145)

## 2023-04-18 LAB — PROTIME-INR
INR: 1.27
PROTIME: 14.1 s — ABNORMAL HIGH (ref 9.9–12.6)

## 2023-04-18 LAB — PHOSPHORUS: PHOSPHORUS: 2.9 mg/dL (ref 2.4–5.1)

## 2023-04-18 MED ADMIN — heparin (porcine) 5,000 unit/mL injection 5,000 Units: 5000 [IU] | SUBCUTANEOUS | @ 18:00:00

## 2023-04-18 MED ADMIN — lactated Ringers infusion: 0-100 mL/h | INTRAVENOUS | @ 02:00:00

## 2023-04-18 MED ADMIN — lactated Ringers infusion: 0-100 mL/h | INTRAVENOUS | @ 10:00:00

## 2023-04-18 MED ADMIN — heparin (porcine) 5,000 unit/mL injection 5,000 Units: 5000 [IU] | SUBCUTANEOUS | @ 10:00:00

## 2023-04-18 MED ADMIN — rifAXIMin (XIFAXAN) tablet 550 mg: 550 mg | ORAL | @ 02:00:00 | Stop: 2023-04-26

## 2023-04-18 MED ADMIN — heparin (porcine) 5,000 unit/mL injection 5,000 Units: 5000 [IU] | SUBCUTANEOUS | @ 02:00:00

## 2023-04-18 MED ADMIN — potassium chloride 10 mEq in 100 mL IVPB: 10 meq | INTRAVENOUS | @ 15:00:00 | Stop: 2023-04-18

## 2023-04-18 MED ADMIN — rifAXIMin (XIFAXAN) tablet 550 mg: 550 mg | ORAL | @ 15:00:00 | Stop: 2023-04-26

## 2023-04-18 NOTE — Unmapped (Signed)
Alert & oriented x4, vital signs stable on room air  Patient endorses no pain at this time, declines overnight tylenol   Producing adequate urine through foley   Producing output through colostomy  Colostomy fit appraised, no issues at this time- stoma beefy red, moist, and intact   Pt remains NPO except for sips w/ meds overnight, NGT to low-intermittent suction  Q6 blood sugars maintained, pt not requiring coverage overnight   Pt continuous fluids paused overnight to promote pt rest, MD notified   Pt ambulated independently at bedside  Falls precautions maintained, bed in low and locked position, side rails up x2, call bell within reach   No questions or concerns at this time     Problem: Adult Inpatient Plan of Care  Goal: Plan of Care Review  Outcome: Progressing  Goal: Patient-Specific Goal (Individualized)  Outcome: Progressing  Goal: Absence of Hospital-Acquired Illness or Injury  Outcome: Progressing  Intervention: Identify and Manage Fall Risk  Recent Flowsheet Documentation  Taken 04/17/2023 2000 by Cynda Acres, RN  Safety Interventions:   fall reduction program maintained   lighting adjusted for tasks/safety   low bed   nonskid shoes/slippers when out of bed  Intervention: Prevent Skin Injury  Recent Flowsheet Documentation  Taken 04/17/2023 2000 by Cynda Acres, RN  Positioning for Skin: Supine/Back  Device Skin Pressure Protection:   absorbent pad utilized/changed   adhesive use limited  Skin Protection: adhesive use limited  Intervention: Prevent and Manage VTE (Venous Thromboembolism) Risk  Recent Flowsheet Documentation  Taken 04/18/2023 0600 by Cynda Acres, RN  Anti-Embolism Device Type: SCD, Knee  Anti-Embolism Intervention: Refused  Anti-Embolism Device Location: BLE  Taken 04/18/2023 0400 by Cynda Acres, RN  Anti-Embolism Device Type: SCD, Knee  Anti-Embolism Intervention: Refused  Anti-Embolism Device Location: BLE  Taken 04/18/2023 0200 by Cynda Acres, RN  Anti-Embolism Device Type: SCD, Knee  Anti-Embolism Intervention: Refused  Anti-Embolism Device Location: BLE  Taken 04/18/2023 0000 by Cynda Acres, RN  Anti-Embolism Device Type: SCD, Knee  Anti-Embolism Intervention: Refused  Anti-Embolism Device Location: BLE  Taken 04/17/2023 2200 by Cynda Acres, RN  Anti-Embolism Device Type: SCD, Knee  Anti-Embolism Intervention: Refused  Anti-Embolism Device Location: BLE  Taken 04/17/2023 2145 by Cynda Acres, RN  VTE Prevention/Management:   ambulation promoted   anticoagulant therapy  Anti-Embolism Device Type: SCD, Knee  Anti-Embolism Intervention: Refused  Anti-Embolism Device Location: BLE  Taken 04/17/2023 2000 by Cynda Acres, RN  Anti-Embolism Device Type: SCD, Knee  Anti-Embolism Intervention: Refused  Anti-Embolism Device Location: BLE  Goal: Optimal Comfort and Wellbeing  Outcome: Progressing  Goal: Readiness for Transition of Care  Outcome: Progressing  Goal: Rounds/Family Conference  Outcome: Progressing

## 2023-04-18 NOTE — Unmapped (Signed)
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 male with a medical history significant for MASLD cirrhosis, type 2 diabetes, HCC, Crohn' s/p left hemicolectomy and end colostomy in 1995. Since then, patient has had recurrent small bowel obstructions managed non-operatively. He was admitted on 04/12/2023 for another small bowel obstruction. He underwent a successful gastrografin challenge on 5/9, with return of bowel function. Nasogastric tube has been removed on 5/10.    Subjective   NAEON. The patient states he is feeling well today, denies nausea with NGT clamped for GG challenge, and after gravity trial this morning. NGT subsequently removed with good relief. Abdominal pain is 1/10 currently and he reports a little appetite, but I've done this drill before so I will take it slow.     Plan   Neuro/Psych:  - transition to oral oxycodone PRN pain  - palliative care consult for goals of care discussion given lucidity, likeihood of recurrent obstructions in the future    CV/Pulm:  - HDS, oxygenating well on RA  #CHF, recent echo 11/16/2022 with EF 55%    FEN/GI:   #SBO resolving  - IVF: LR at 50 ml  - continue daily labs, replete electrolytes PRN  - hypokalemia, IV potassium repletion today  - diet: Nutrition Therapy Clear Liquid   - s/p gastrografin study on 5/9, with return of bowel function  - NGT removed today  - strict I/O's    #Enterocutaneous fistula POA, continue dry gauze changes PRN    #Decompensated MASLD cirrhosis, HCC  - consider restarting diuretics when tolerating PO  - daily weights  - continue rifaximin BID. If unable to take PO medicines, consider lactulose enema   - hepatology signed off    Renal/GU:  #CKD stage 3, creatinine stable  - voiding spontaneously    Endo:  #type 2 diabetes  - SSI, POCT glucose checks    Heme/ID:  - H&H stable, no active signs/symptoms of bleeding  - leukocytosis improving, continue daily labs    Prophylaxis: subcutaneous heparin 5,000 units TID, SCDs, out of bed/ambulate TID, IS, PPI    Access: peripheral IV    Dispo: floor status  - home health for therapies confirmed with [facility]    Objective   Vital signs over the last 24 hours:  Temp:  [36.2 ??C (97.2 ??F)-36.7 ??C (98.1 ??F)] 36.7 ??C (98.1 ??F)  Heart Rate:  [61-76] 66  Resp:  [17-18] 18  BP: (133-149)/(64-86) 143/64  MAP (mmHg):  [86-104] 87  SpO2:  [96 %-98 %] 98 %    Physical Exam:  Gen: alert, in mild distress  CV: regular rate, normotensive  Pulm: breathing comfortably on room air  Abd: non-distended, and minimally tender to palpation.  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  Colostomy stoma pink, moist, nonprolapsed. Ostomy bag  with brown chunky output. NGT tube suctioning greenish content.  Ext: warm and well perfused bilaterally, no edema     Thomas Rosales, MS4    ----------  I attest that I have reviewed the medical student note and that the components of the history of the present illness, the physical exam, and the assessment and plan documented were performed by me or were performed in my presence by the student where I verified the documentation and performed (or re-performed) the exam and medical decision making.     Thomas Rosales A Thomas Scrivens, PA-C

## 2023-04-18 NOTE — Unmapped (Addendum)
Palliative Care Consult Note    Consultation from Requesting Attending Physician:  Sharyn Lull, MD  Service Requesting Consult:  [facility])  Reason for Consult Request from Attending Physician:  Evaluation of Goals of Care / Decision Making  Primary Care Provider:  Cory Roughen, PA        Assessment/Plan:      SUMMARY:  This 82 y.o. patient is seriously and acutely ill due to SBO , complicated by co-morbid acute and chronic conditions including MASLD cirrhosis, DM, HCC, Chrons s/p L hemicolectomy and end colostomy. He has had multiple SBOs managed non operatively.    Symptom Assessment and Recommendations:    - Please refer to palliative care on discharge for continued support, GOC, and potential need for symptom management in future as well as working through when the right time for hospice will be     Goals of Care and Decision Making Assessment and Recommendations:     Healthcare Decision Maker if lacks capacity:    HCDM (patient stated preference): Hamdi, Balicki - Spouse - [phone]    Advance Directive: no    Code status:   Code Status: Full Code         Goals of care as discussed on /5/10: see ACP. DNR/DNI. QOL for him is meaningful interactions, independence, quality of life, faith. He has had conversations with his family about what he wants his time to look like and states that he has strong belief that God will tell him when it's his time.       Recommendations shared with primary team via Epic chat      Thank you for this consult. Please contact  Luna Glasgow  or page Palliative Care if there are any questions.   Palliative Care plans to visit the patient again on Monday if still admitted.     Subjective:     HPI:  obtained from chart, patient and provider.82 y.o. patient is seriously and acutely ill due to SBO , complicated by co-morbid acute and chronic conditions including MASLD cirrhosis, DM, HCC, Chrons s/p L hemicolectomy and end colostomy. He has had multiple SBOs managed non operatively.    Patient provided life review of his very interesting life and career. 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 His common theme throughout his life is that God has a plan and he finds great strength in his faith. He has a wife he has been with since college and 2 children, 5 grandchildren who he is close with. Chrons has always effected his life in many ways but he was able to work despite his challenging disease until he hit his limit. His greatest concern is that his wife is taken care of when he dies. He shares his experience with facing his own mortality and has processed this quite a bit. He values meangingful time with family, independence, quality of life and faith. They have as a family already made plans for after death and he knows that he wants to be DNR/DNI. When asked when he knows when to stop seeking more aggressive medical treatment, he knows that God will let him know when it is his time and he also knows his family will be able to be his voice should he not. His wife is an Charity fundraiser and is very supportive. They know about hospice  as they were exploring that as an option, but he would have to stop some of his medications that would certainly shorten his life, which he would not want to do. However, he would like the support and guidance of palliative care outpatient both for himself and his wife as they navigate this next phase. He believes his time is short as he has developed symptoms such as hallucinations and worsening signs through his labs.    Symptom Severity, Assessment and Current Medication / Treatment:   Did not assess today due to time spent with reflective interviewing and GOC           Objective:       Function:  80% - Ambulation: Full / Normal Activity with effort, some evidence of disease / Self-Care:Full / Intake: Normal or reduced / Level of Conscious: Full    Temp:  [36.2 ??C (97.2 ??F)-36.7 ??C (98 ??F)] 36.2 ??C (97.2 ??F)  Heart Rate:  [61-76] 61  Resp:  [16-18] 18  BP: (131-149)/(58-86) 133/86  SpO2:  [96 %-98 %] 97 %    Physical Exam:  Constitutional: NAD    Pulm: breathing comfortably on RA   MS: moving upperand lower extremities independently   Neuro: cognitive status alert, interactive   Psych: mood and affect very pleasant     Testing reviewed and interpreted:   Reviewed and interpreted test results for labs affecting assessment of underlying illness severity and prognosis      I personally spent 60 minutes face-to-face and non-face-to-face in the care of this patient, which includes all pre, intra, and post visit time on the date of service.  All documented time was specific to the E/M visit and does not include any procedures that may have been performed.     See ACP Note from today for additional billable service:  Yes.

## 2023-04-18 NOTE — Unmapped (Signed)
-   VS Stable  - Adequate UOP, voiding freely  - Flush 30mL Q4 NGT  - NGT maintained in place, output throughout the day   - No complaint of ABD pain/distention  - Adequate and increasing output from ostomy bag   - No questions or concerns at this point in time    Relevant to note:  - Patient was given PO meds at 9:30 AM. NGT clamped (At this time were in the collection container)  - 11:30 patient took gastrografin, NGT maintained clamped per protocol (& per SRG)  - 1800 Patient connected back to LWIS ( out), so total of          Problem: Adult Inpatient Plan of Care  Goal: Plan of Care Review  04/17/2023 1949 by Caffie Pinto, RN  Outcome: Progressing  04/17/2023 1949 by Caffie Pinto, RN  Outcome: Progressing  04/17/2023 1925 by Caffie Pinto, RN  Outcome: Progressing  Goal: Patient-Specific Goal (Individualized)  04/17/2023 1949 by Caffie Pinto, RN  Outcome: Progressing  04/17/2023 1949 by Caffie Pinto, RN  Outcome: Progressing  04/17/2023 1925 by Caffie Pinto, RN  Outcome: Progressing  Goal: Absence of Hospital-Acquired Illness or Injury  04/17/2023 1949 by Caffie Pinto, RN  Outcome: Progressing  04/17/2023 1949 by Caffie Pinto, RN  Outcome: Progressing  04/17/2023 1925 by Caffie Pinto, RN  Outcome: Progressing  Intervention: Prevent and Manage VTE (Venous Thromboembolism) Risk  Recent Flowsheet Documentation  Taken 04/17/2023 0800 by Caffie Pinto, RN  Anti-Embolism Device Type: SCD, Knee  Anti-Embolism Intervention: Refused  Anti-Embolism Device Location: BLE  Goal: Optimal Comfort and Wellbeing  04/17/2023 1949 by Caffie Pinto, RN  Outcome: Progressing  04/17/2023 1949 by Caffie Pinto, RN  Outcome: Progressing  04/17/2023 1925 by Caffie Pinto, RN  Outcome: Progressing  Goal: Readiness for Transition of Care  04/17/2023 1949 by Caffie Pinto, RN  Outcome: Progressing  04/17/2023 1949 by Caffie Pinto, RN  Outcome: Progressing  04/17/2023 1925 by Caffie Pinto, RN  Outcome: Progressing  Goal: Rounds/Family Conference  04/17/2023 1949 by Caffie Pinto, RN  Outcome: Progressing  04/17/2023 1949 by Caffie Pinto, RN  Outcome: Progressing  04/17/2023 1925 by Caffie Pinto, RN  Outcome: Progressing

## 2023-04-18 NOTE — Unmapped (Signed)
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 patient is seriously and acutely ill due to SBO , complicated by co-morbid acute and chronic conditions including MASLD cirrhosis, DM, HCC, Chrons s/p L hemicolectomy and end colostomy. He has had multiple SBOs managed non operatively.     CWOCN consulted for pouching assistance with pt's established colostomy and EC fistulas x2. Per pt, his fistula output is minimal at home and is routinely managed with dry gauze and silicone tape. However, during this admission, he has developed high pressure fistula output and would benefit from pouching. Demonstrated to patient's son at the bedside and primary RN how to apply colostomy pouch and small wound manager offset from each other. Skin tac applied and barrier ring used to fill in creasing and uneven contours to provide flat pouching surface. All supplies left at the bedside for future changes, [place] #'s provided below for reordering while pt is admitted. Primary RN updated on POC.     Stoma Type:  - Colostomy  - EC fistulas x2 Stoma Location:  - LLQ (Left Lower Quadrant)  - EC fistulas in midline     Stoma Characteristics:  - Oval  - Budded  - x2 EC fistulas in midline Stoma Mucosal Condition and Color:  - Moist  - Red     Mucocutaneous Junction:  - Intact    Rod/Stents:  - No     Output:  - Brown  - Thin  - Liquid  - high pressure output      Peristomal Skin Condition:   - Intact  - scar tissue    Abdominal Contours:  - Firm  - Uneven  - scarring and dimpling around midline    Pouching System:  - 2 Piece  - Flat  - CTF (Cut to fit)  - Stoma paste  - Small wound manager to x2 EC fistulas Anticipated Wear Time of Pouching System:  - To be determined  - Colostomy pouching lasts 5-7 days on average for pt     Teaching Limitations/Considerations:   - N/A    Teaching/Instructions:  - N/A, pt has an established colostomy and has managed his fistulas with dressings at home independently for years    [employer] Kit - Verbal Consent Obtained:  - deferred    Recommendations/Plan:   - If pouch leaks, patient to change independently. If wound manager leaks, RN to assist pt with pouch change.  - Continue with current pouching system.  - Instructed pt to cut off spout to wound manager and switch to clip closure if fistula output thickens and is unable to pass through opening.     Ostomy Discharge Goals:  - Patient/family member able to empty pouch independently.  - Patient/family member able to change pouch independently.  - Patient/family member able to identify ostomy supplies to take home  at discharge and knowledge of supply order process.     Recommended Consults:   - Not Applicable Plan of Care Discussed With:  - Patient  - Family  - RN primary     Ostomy Supplies:   - In-patient supplies at the bedside.  - Unit to order.    Ostomy Product List:  Inpatient supply list    FISTULA-  Small wound manager [phone])  Skin TAC Adhesive Wipes (mfg [id number])      COLOSTOMY-  Pouch System:  Hollister 2-Piece CTF Flat Wafer Red (mfg [id number]) - lawson number [phone]) order 5  Hollister 2-Piece Fecal Pouch Red (mfg [id number]) - lawson number [phone]) order 5      Accessories:   Naval architect Strip (mfg [id number]) - lawson number [id number]) order 10   ConvaTec Stomahesive Paste (mfg [id number]) - lawson number [phone]) order 1   Esenta ConvaTec Adhesive Remover Wipes (mfg [id number]) - lawson number [phone]) order 1 box   Hollister Stoma Powder (mfg [id number]) - lawson number [phone]) order 1   30M No-Sting Barrier Film- Pads (mfg [id number]) - lawson number ([id number]) order 1 box    Skin TAC Adhesive Wipes (mfg [id number]) - lawson number ([id number]) order 1 box        WOCN Follow-up:  - We will sign off at this time    Workup Time:  60 minutes    Ann (Roxanne Panek-Chia) Henderson Newcomer RN, BSN CWOCN [facility]  [phone]  Phone- [phone]

## 2023-04-18 NOTE — Unmapped (Signed)
ADVANCE CARE PLANNING NOTE    Discussion Date:  Apr 18, 2023    Patient has decisional capacity:  Yes    Patient has selected a Health Care Decision-Maker if loses capacity: Yes    Health Care Decision Maker as of 04/18/2023    HCDM (patient stated preference): Thomas Rosales, Thomas Rosales - Spouse - [phone]    Discussion Participants:  Patient and his son Thomas Rosales    Communication of Medical Status/Prognosis:   Patient describes he knows he is nearing the end of his life with his disease and is processing his own mortality. He values quality of life, interactions with family, independence and his faith. He talks about the fine balance between God and science, which does influence his medical decision making.    Communication of Treatment Goals/Options:   Patient shared he has already made the decision to be DNR/DNI. I discussed that we do not have that in the system as such right now and he is ok with implementing it here.     Treatment Decisions:   - DNR/DNI  - Patient would like palliative care outpatient to continue to navigate as his disease progresses and for support of him and his wife          I spent 20 minutes providing voluntary advance care planning services for this patient.

## 2023-04-18 NOTE — Unmapped (Signed)
-   VS Stable  - Adequate UOP, voiding freely  - Flush 30mL Q4 NGT  - NGT maintained in place, output throughout the day   - No complaint of ABD pain/distention  - Adequate and increasing output from ostomy bag   - No questions or concerns at this point in time    Relevant to note:  - Patient was given PO meds at 9:30 AM. NGT clamped  - 11:30 patient took gastrografin, NGT maintained clamped per protocol (& per SRG)  - 1800 Patient connected back to LWIS        Problem: Adult Inpatient Plan of Care  Goal: Plan of Care Review  Outcome: Progressing  Goal: Patient-Specific Goal (Individualized)  Outcome: Progressing  Goal: Absence of Hospital-Acquired Illness or Injury  Outcome: Progressing  Goal: Optimal Comfort and Wellbeing  Outcome: Progressing  Goal: Readiness for Transition of Care  Outcome: Progressing  Goal: Rounds/Family Conference  Outcome: Progressing     Problem: Fall Injury Risk  Goal: Absence of Fall and Fall-Related Injury  Outcome: Progressing

## 2023-04-18 NOTE — Unmapped (Signed)
Adult Nutrition Progress Note      Visit Type: Follow-Up  Reason for Visit: NPO/CLD > 7 days      Patient's last PO intake was lunch 05/02, his has required NPO since admit. Now s/p gastrografin challenge and clam trial 05/09. NGT remains in place. Noted 1000 ml NGT output and 2035 ml stool output yesterday.     NUTRITION INTERVENTIONS and RECOMMENDATION      Advance diet once medically feasible   Patient meets criteria for TPN given prolonged SBO and NPO/CLD > 7 days      Follow-Up Parameters:   1-2 times per week (and more frequent as indicated)     Gladys Damme MS, RD, LD, CNSC  [phone]

## 2023-04-19 LAB — BASIC METABOLIC PANEL
ANION GAP: 7 mmol/L (ref 5–14)
BLOOD UREA NITROGEN: 49 mg/dL — ABNORMAL HIGH (ref 9–23)
BUN / CREAT RATIO: 42
CALCIUM: 8.8 mg/dL (ref 8.7–10.4)
CHLORIDE: 102 mmol/L (ref 98–107)
CO2: 28 mmol/L (ref 20.0–31.0)
CREATININE: 1.18 mg/dL
EGFR CKD-EPI (2021) MALE: 62 mL/min/{1.73_m2} (ref >=60)
GLUCOSE RANDOM: 85 mg/dL (ref 70–179)
POTASSIUM: 3.2 mmol/L — ABNORMAL LOW (ref 3.4–4.8)
SODIUM: 137 mmol/L (ref 135–145)

## 2023-04-19 LAB — CBC
HEMATOCRIT: 38.1 % — ABNORMAL LOW (ref 39.0–48.0)
HEMOGLOBIN: 12.9 g/dL (ref 12.9–16.5)
MEAN CORPUSCULAR HEMOGLOBIN CONC: 33.8 g/dL (ref 32.0–36.0)
MEAN CORPUSCULAR HEMOGLOBIN: 31.5 pg (ref 25.9–32.4)
MEAN CORPUSCULAR VOLUME: 93 fL (ref 77.6–95.7)
MEAN PLATELET VOLUME: 9.3 fL (ref 6.8–10.7)
PLATELET COUNT: 181 10*9/L (ref 150–450)
RED BLOOD CELL COUNT: 4.1 10*12/L — ABNORMAL LOW (ref 4.26–5.60)
RED CELL DISTRIBUTION WIDTH: 14.4 % (ref 12.2–15.2)
WBC ADJUSTED: 10.6 10*9/L (ref 3.6–11.2)

## 2023-04-19 LAB — HEPATIC FUNCTION PANEL
ALBUMIN: 3.1 g/dL — ABNORMAL LOW (ref 3.4–5.0)
ALKALINE PHOSPHATASE: 127 U/L — ABNORMAL HIGH (ref 46–116)
ALT (SGPT): 18 U/L (ref 10–49)
AST (SGOT): 31 U/L (ref <=34)
BILIRUBIN DIRECT: 0.7 mg/dL — ABNORMAL HIGH (ref 0.00–0.30)
BILIRUBIN TOTAL: 1.4 mg/dL — ABNORMAL HIGH (ref 0.3–1.2)
PROTEIN TOTAL: 6.7 g/dL (ref 5.7–8.2)

## 2023-04-19 LAB — PHOSPHORUS: PHOSPHORUS: 2.5 mg/dL (ref 2.4–5.1)

## 2023-04-19 LAB — MAGNESIUM: MAGNESIUM: 2.3 mg/dL (ref 1.6–2.6)

## 2023-04-19 LAB — PROTIME-INR
INR: 1.28
PROTIME: 14.2 s — ABNORMAL HIGH (ref 9.9–12.6)

## 2023-04-19 MED ADMIN — rifAXIMin (XIFAXAN) tablet 550 mg: 550 mg | ORAL | @ 02:00:00 | Stop: 2023-04-26

## 2023-04-19 MED ADMIN — insulin lispro (HumaLOG) injection 0-20 Units: 0-20 [IU] | SUBCUTANEOUS | @ 22:00:00

## 2023-04-19 MED ADMIN — heparin (porcine) 5,000 unit/mL injection 5,000 Units: 5000 [IU] | SUBCUTANEOUS | @ 02:00:00

## 2023-04-19 MED ADMIN — rifAXIMin (XIFAXAN) tablet 550 mg: 550 mg | ORAL | @ 13:00:00 | Stop: 2023-04-26

## 2023-04-19 MED ADMIN — heparin (porcine) 5,000 unit/mL injection 5,000 Units: 5000 [IU] | SUBCUTANEOUS | @ 18:00:00

## 2023-04-19 MED ADMIN — heparin (porcine) 5,000 unit/mL injection 5,000 Units: 5000 [IU] | SUBCUTANEOUS | @ 10:00:00

## 2023-04-19 NOTE — Unmapped (Signed)
-   VS Stable  - Adequate UOP, voiding freely  - NGT out @1200   - No complaint of ABD pain/distention  - No complaints of nausea, vomiting, discomfort  - Adequate PO intake on a clear liquid diet    - Adequate and increasing output from ostomy bag   - Fistula ruptured, oozing blood and stools. SRG paged  - WOCN consult placed, added wound manager over fistula and changed colostomy  - SRG paged again 7 at bedside  - No questions or concerns at this point in time        Problem: Adult Inpatient Plan of Care  Goal: Plan of Care Review  Outcome: Progressing  Goal: Patient-Specific Goal (Individualized)  Outcome: Progressing  Goal: Absence of Hospital-Acquired Illness or Injury  Outcome: Progressing  Goal: Optimal Comfort and Wellbeing  Outcome: Progressing  Goal: Readiness for Transition of Care  Outcome: Progressing  Goal: Rounds/Family Conference  Outcome: Progressing     Problem: Fall Injury Risk  Goal: Absence of Fall and Fall-Related Injury  Outcome: Progressing

## 2023-04-19 NOTE — Unmapped (Signed)
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 male with a medical history significant for MASLD cirrhosis, type 2 diabetes, HCC, Crohn' s/p left hemicolectomy and end colostomy in 1995. Since then, patient has had recurrent small bowel obstructions managed non-operatively. He was admitted on 04/12/2023 for another small bowel obstruction. He underwent a successful gastrografin challenge on 5/9, with return of bowel function. Nasogastric tube has been removed on 5/10.    Subjective   Yesterday evening after starting a CLD  his prior enterocutaneous fistulas which had scant output opened up and put out ~69mL over an hour. The output decreased overnight. Site has wound Production designer, theatre/television/film in place.    Otherwise NAEO. Feels well, less distended, no nausea with CLD.    Plan   Neuro/Psych:  - PO oxycodone PRN pain  - palliative care consult for goals of care discussion given lucidity, likeihood of recurrent obstructions in the future    CV/Pulm:  - HDS, oxygenating well on RA  #CHF, recent echo 11/16/2022 with EF 55%    FEN/GI:   #SBO resolving  - IVF: ML  - continue daily labs, replete electrolytes PRN  - diet: Surgical GI today  - s/p gastrografin study on 5/9, with return of bowel function  - NGT removed 5/10  - strict I/O's    #Enterocutaneous fistula POA  - Actively draining, appears succus in nature  - Maintain wound manager    #Decompensated MASLD cirrhosis, HCC  - consider restarting diuretics when tolerating PO  - daily weights  - continue rifaximin BID. If unable to take PO medicines, consider lactulose enema   - hepatology signed off    Renal/GU:  #CKD stage 3, creatinine stable  - voiding spontaneously    Endo:  #type 2 diabetes  - SSI, POCT glucose checks    Heme/ID:  - H&H stable, no active signs/symptoms of bleeding  - leukocytosis improving, continue daily labs    Prophylaxis: subcutaneous heparin 5,000 units TID, SCDs, out of bed/ambulate TID, IS, PPI    Access: peripheral IV    Dispo: floor status  - home health for therapies confirmed with [facility]    Objective   Vital signs over the last 24 hours:  Temp:  [36.2 ??C (97.2 ??F)-36.7 ??C (98.1 ??F)] 36.7 ??C (98.1 ??F)  Heart Rate:  [61-75] 63  Resp:  [16-18] 18  BP: (117-143)/(54-86) 117/54  MAP (mmHg):  [74-94] 74  SpO2:  [97 %-99 %] 98 %    Physical Exam:  Gen: alert, in mild distress  CV: regular rate, normotensive  Pulm: breathing comfortably on room air  Abd: non-distended, and minimally tender to palpation. Colostomy stoma pink, moist, nonprolapsed. Ostomy bag with brown chunky output. Wound manager over old EC fistula with brown, liquid output  Ext: warm and well perfused bilaterally, no  edema

## 2023-04-19 NOTE — Unmapped (Shared)
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 male with a medical history significant for MASLD cirrhosis, type 2 diabetes, HCC, Crohn' s/p left hemicolectomy and end colostomy in 1995. Since then, patient has had recurrent small bowel obstructions managed non-operatively. He was admitted on 04/12/2023 for another small bowel obstruction. He underwent a successful gastrografin challenge on 5/9, with return of bowel function. Nasogastric tube has been removed on 5/10.    Subjective   NAEON. The patient states he is feeling well today, denies nausea with NGT clamped for GG challenge, and after gravity trial this morning. NGT subsequently removed with good relief. Abdominal pain is 1/10 currently and he reports a little appetite, but I've done this drill before so I will take it slow.     Plan   Neuro/Psych:  - transition to oral oxycodone PRN pain  - palliative care consult for goals of care discussion given lucidity, likeihood of recurrent obstructions in the future    CV/Pulm:  - HDS, oxygenating well on RA  #CHF, recent echo 11/16/2022 with EF 55%    FEN/GI:   #SBO resolving  - IVF: LR at 50 ml  - continue daily labs, replete electrolytes PRN  - hypokalemia, IV potassium repletion today  - diet: Nutrition Therapy Clear Liquid   - s/p gastrografin study on 5/9, with return of bowel function  - NGT removed today  - strict I/O's    #Enterocutaneous fistula POA, continue dry gauze changes PRN    #Decompensated MASLD cirrhosis, HCC  - consider restarting diuretics when tolerating PO  - daily weights  - continue rifaximin BID. If unable to take PO medicines, consider lactulose enema   - hepatology signed off    Renal/GU:  #CKD stage 3, creatinine stable  - voiding spontaneously    Endo:  #type 2 diabetes  - SSI, POCT glucose checks    Heme/ID:  - H&H stable, no active signs/symptoms of bleeding  - leukocytosis improving, continue daily labs    Prophylaxis: subcutaneous heparin 5,000 units TID, SCDs, out of bed/ambulate TID, IS, PPI    Access: peripheral IV    Dispo: floor status  - home health for therapies confirmed with [facility]    Objective   Vital signs over the last 24 hours:  Temp:  [36.2 ??C (97.2 ??F)-36.7 ??C (98.1 ??F)] 36.7 ??C (98.1 ??F)  Heart Rate:  [61-75] 63  Resp:  [16-18] 18  BP: (117-143)/(54-86) 117/54  MAP (mmHg):  [74-94] 74  SpO2:  [97 %-99 %] 98 %    Physical Exam:  Gen: alert, in mild distress  CV: regular rate, normotensive  Pulm: breathing comfortably on room air  Abd: non-distended, and minimally tender to palpation.  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  Colostomy stoma pink, moist, nonprolapsed. Ostomy bag  with brown chunky output. NGT tube suctioning greenish content.  Ext: warm and well perfused bilaterally, no edema     [name], MS4    ----------  I attest that I have reviewed the medical student note and that the components of the history of the present illness, the physical exam, and the assessment and plan documented were performed by me or were performed in my presence by the student where I verified the documentation and performed (or re-performed) the exam and medical decision making.     Alaina A Rookard, PA-C

## 2023-04-19 NOTE — Unmapped (Signed)
Alert & oriented x4, vital signs stable on room air  Patient endorses no pain at this time, declines overnight tylenol   Voiding and producing adequate urine  Producing output through colostomy  Colostomy fit appraised, no issues at this time- stoma beefy red, moist, and intact   Wound manager placed over fistula on umbilicus due to moderate drainage  700 mL output overnight   Pt tolerating clear liquids diet without nausea/vomiting   Q6 blood sugars maintained, pt not requiring coverage overnight   Pt up ad lib  Falls precautions maintained, bed in low and locked position, side rails up x2, call bell within reach   No questions or concerns at this time    Problem: Adult Inpatient Plan of Care  Goal: Plan of Care Review  Outcome: Ongoing - Unchanged  Goal: Patient-Specific Goal (Individualized)  Outcome: Ongoing - Unchanged  Goal: Absence of Hospital-Acquired Illness or Injury  Outcome: Ongoing - Unchanged  Intervention: Identify and Manage Fall Risk  Recent Flowsheet Documentation  Taken 04/18/2023 2000 by Cynda Acres, RN  Safety Interventions:   fall reduction program maintained   lighting adjusted for tasks/safety   low bed   nonskid shoes/slippers when out of bed  Intervention: Prevent Skin Injury  Recent Flowsheet Documentation  Taken 04/18/2023 2000 by Cynda Acres, RN  Positioning for Skin: Supine/Back  Device Skin Pressure Protection:   absorbent pad utilized/changed   adhesive use limited  Skin Protection: adhesive use limited  Intervention: Prevent and Manage VTE (Venous Thromboembolism) Risk  Recent Flowsheet Documentation  Taken 04/19/2023 0600 by Cynda Acres, RN  Anti-Embolism Device Type: SCD, Knee  Anti-Embolism Intervention: Refused  Anti-Embolism Device Location: BLE  Taken 04/19/2023 0400 by Cynda Acres, RN  Anti-Embolism Device Type: SCD, Knee  Anti-Embolism Intervention: Refused  Anti-Embolism Device Location: BLE  Taken 04/19/2023 0200 by Cynda Acres, RN  Anti-Embolism Device Type: SCD, Knee  Anti-Embolism Intervention: Refused  Anti-Embolism Device Location: BLE  Taken 04/19/2023 0000 by Cynda Acres, RN  Anti-Embolism Device Type: SCD, Knee  Anti-Embolism Intervention: Refused  Anti-Embolism Device Location: BLE  Taken 04/18/2023 2200 by Cynda Acres, RN  Anti-Embolism Device Type: SCD, Knee  Anti-Embolism Intervention: Refused  Anti-Embolism Device Location: BLE  Taken 04/18/2023 2146 by Cynda Acres, RN  VTE Prevention/Management:   ambulation promoted   anticoagulant therapy  Anti-Embolism Device Type: SCD, Knee  Anti-Embolism Intervention: Refused  Anti-Embolism Device Location: BLE  Taken 04/18/2023 2000 by Cynda Acres, RN  Anti-Embolism Device Type: SCD, Knee  Anti-Embolism Intervention: Refused  Anti-Embolism Device Location: BLE  Goal: Optimal Comfort and Wellbeing  Outcome: Ongoing - Unchanged  Goal: Readiness for Transition of Care  Outcome: Ongoing - Unchanged  Goal: Rounds/Family Conference  Outcome: Ongoing - Unchanged

## 2023-04-19 NOTE — Unmapped (Signed)
Problem: Adult Inpatient Plan of Care  Goal: Plan of Care Review  Outcome: Progressing  Goal: Patient-Specific Goal (Individualized)  Outcome: Progressing  Goal: Absence of Hospital-Acquired Illness or Injury  Outcome: Progressing  Goal: Optimal Comfort and Wellbeing  Outcome: Progressing  Goal: Readiness for Transition of Care  Outcome: Progressing  Goal: Rounds/Family Conference  Outcome: Progressing     Problem: Fall Injury Risk  Goal: Absence of Fall and Fall-Related Injury  Outcome: Progressing

## 2023-04-20 LAB — CBC
HEMATOCRIT: 41.6 % (ref 39.0–48.0)
HEMATOCRIT: 42.4 % (ref 39.0–48.0)
HEMOGLOBIN: 14.1 g/dL (ref 12.9–16.5)
HEMOGLOBIN: 14.4 g/dL (ref 12.9–16.5)
MEAN CORPUSCULAR HEMOGLOBIN CONC: 33.9 g/dL (ref 32.0–36.0)
MEAN CORPUSCULAR HEMOGLOBIN CONC: 34 g/dL (ref 32.0–36.0)
MEAN CORPUSCULAR HEMOGLOBIN: 31.2 pg (ref 25.9–32.4)
MEAN CORPUSCULAR HEMOGLOBIN: 31.5 pg (ref 25.9–32.4)
MEAN CORPUSCULAR VOLUME: 91.9 fL (ref 77.6–95.7)
MEAN CORPUSCULAR VOLUME: 93.1 fL (ref 77.6–95.7)
MEAN PLATELET VOLUME: 8.5 fL (ref 6.8–10.7)
MEAN PLATELET VOLUME: 8.7 fL (ref 6.8–10.7)
PLATELET COUNT: 204 10*9/L (ref 150–450)
PLATELET COUNT: 211 10*9/L (ref 150–450)
RED BLOOD CELL COUNT: 4.47 10*12/L (ref 4.26–5.60)
RED BLOOD CELL COUNT: 4.61 10*12/L (ref 4.26–5.60)
RED CELL DISTRIBUTION WIDTH: 14.3 % (ref 12.2–15.2)
RED CELL DISTRIBUTION WIDTH: 14.5 % (ref 12.2–15.2)
WBC ADJUSTED: 13.5 10*9/L — ABNORMAL HIGH (ref 3.6–11.2)
WBC ADJUSTED: 13.6 10*9/L — ABNORMAL HIGH (ref 3.6–11.2)

## 2023-04-20 LAB — HEPATIC FUNCTION PANEL
ALBUMIN: 3 g/dL — ABNORMAL LOW (ref 3.4–5.0)
ALKALINE PHOSPHATASE: 145 U/L — ABNORMAL HIGH (ref 46–116)
ALT (SGPT): 20 U/L (ref 10–49)
AST (SGOT): 35 U/L — ABNORMAL HIGH (ref <=34)
BILIRUBIN DIRECT: 0.7 mg/dL — ABNORMAL HIGH (ref 0.00–0.30)
BILIRUBIN TOTAL: 1.3 mg/dL — ABNORMAL HIGH (ref 0.3–1.2)
PROTEIN TOTAL: 6.5 g/dL (ref 5.7–8.2)

## 2023-04-20 LAB — MAGNESIUM: MAGNESIUM: 2 mg/dL (ref 1.6–2.6)

## 2023-04-20 LAB — BASIC METABOLIC PANEL
ANION GAP: 8 mmol/L (ref 5–14)
BLOOD UREA NITROGEN: 32 mg/dL — ABNORMAL HIGH (ref 9–23)
BUN / CREAT RATIO: 32
CALCIUM: 8.6 mg/dL — ABNORMAL LOW (ref 8.7–10.4)
CHLORIDE: 104 mmol/L (ref 98–107)
CO2: 25 mmol/L (ref 20.0–31.0)
CREATININE: 1.01 mg/dL
EGFR CKD-EPI (2021) MALE: 75 mL/min/{1.73_m2} (ref >=60)
GLUCOSE RANDOM: 94 mg/dL (ref 70–179)
POTASSIUM: 3.1 mmol/L — ABNORMAL LOW (ref 3.4–4.8)
SODIUM: 137 mmol/L (ref 135–145)

## 2023-04-20 LAB — PROTIME-INR
INR: 1.35
PROTIME: 15 s — ABNORMAL HIGH (ref 9.9–12.6)

## 2023-04-20 LAB — PHOSPHORUS: PHOSPHORUS: 2.4 mg/dL (ref 2.4–5.1)

## 2023-04-20 MED ADMIN — heparin (porcine) 5,000 unit/mL injection 5,000 Units: 5000 [IU] | SUBCUTANEOUS | @ 02:00:00

## 2023-04-20 MED ADMIN — rifAXIMin (XIFAXAN) tablet 550 mg: 550 mg | ORAL | @ 02:00:00 | Stop: 2023-04-26

## 2023-04-20 MED ADMIN — rifAXIMin (XIFAXAN) tablet 550 mg: 550 mg | ORAL | @ 13:00:00 | Stop: 2023-04-20

## 2023-04-20 MED ADMIN — pantoprazole (Protonix) EC tablet 40 mg: 40 mg | ORAL | @ 16:00:00 | Stop: 2023-04-20

## 2023-04-20 MED ADMIN — potassium chloride ER tablet 40 mEq: 40 meq | ORAL | @ 13:00:00 | Stop: 2023-04-20

## 2023-04-20 MED ADMIN — heparin (porcine) 5,000 unit/mL injection 5,000 Units: 5000 [IU] | SUBCUTANEOUS | @ 20:00:00 | Stop: 2023-04-20

## 2023-04-20 MED ADMIN — potassium phosphate 15 mmol in sodium chloride (NS) 0.9 % 250 mL infusion: 15 mmol | INTRAVENOUS | @ 13:00:00 | Stop: 2023-04-20

## 2023-04-20 MED ADMIN — insulin lispro (HumaLOG) injection 0-20 Units: 0-20 [IU] | SUBCUTANEOUS | @ 04:00:00

## 2023-04-20 MED ADMIN — spironolactone (ALDACTONE) tablet 50 mg: 50 mg | ORAL | @ 13:00:00 | Stop: 2023-04-20

## 2023-04-20 MED ADMIN — heparin (porcine) 5,000 unit/mL injection 5,000 Units: 5000 [IU] | SUBCUTANEOUS | @ 10:00:00 | Stop: 2023-04-20

## 2023-04-20 NOTE — Unmapped (Signed)
Pt slept well overnight. VSS. Up ad lib. Colostomy with tan soft output, wound manager with dark red liquid output, 1 episode of bloody output with small clots. LR infusing at 50 in right arm IV. No complaints of pain or N/V. Plan to discharge home with home health when medically ready.     Problem: Adult Inpatient Plan of Care  Goal: Plan of Care Review  Outcome: Progressing  Goal: Patient-Specific Goal (Individualized)  Outcome: Progressing  Goal: Absence of Hospital-Acquired Illness or Injury  Outcome: Progressing  Intervention: Identify and Manage Fall Risk  Recent Flowsheet Documentation  Taken 04/20/2023 0400 by Gracy Racer, RN  Safety Interventions:   fall reduction program maintained   low bed   nonskid shoes/slippers when out of bed  Taken 04/20/2023 0200 by Gracy Racer, RN  Safety Interventions:   fall reduction program maintained   low bed   nonskid shoes/slippers when out of bed  Taken 04/20/2023 0000 by Gracy Racer, RN  Safety Interventions:   fall reduction program maintained   low bed   nonskid shoes/slippers when out of bed  Taken 04/19/2023 2330 by Gracy Racer, RN  Safety Interventions:   environmental modification   fall reduction program maintained   low bed   lighting adjusted for tasks/safety   nonskid shoes/slippers when out of bed  Taken 04/19/2023 2200 by Gracy Racer, RN  Safety Interventions:   fall reduction program maintained   low bed   nonskid shoes/slippers when out of bed  Taken 04/19/2023 2000 by Gracy Racer, RN  Safety Interventions:   fall reduction program maintained   low bed   nonskid shoes/slippers when out of bed  Goal: Optimal Comfort and Wellbeing  Outcome: Progressing  Goal: Readiness for Transition of Care  Outcome: Progressing  Goal: Rounds/Family Conference  Outcome: Progressing     Problem: Fall Injury Risk  Goal: Absence of Fall and Fall-Related Injury  Outcome: Progressing  Intervention: Promote Scientist, clinical (histocompatibility and immunogenetics) Documentation  Taken 04/20/2023 0400 by Gracy Racer, RN  Safety Interventions:   fall reduction program maintained   low bed   nonskid shoes/slippers when out of bed  Taken 04/20/2023 0200 by Gracy Racer, RN  Safety Interventions:   fall reduction program maintained   low bed   nonskid shoes/slippers when out of bed  Taken 04/20/2023 0000 by Gracy Racer, RN  Safety Interventions:   fall reduction program maintained   low bed   nonskid shoes/slippers when out of bed  Taken 04/19/2023 2330 by Gracy Racer, RN  Safety Interventions:   environmental modification   fall reduction program maintained   low bed   lighting adjusted for tasks/safety   nonskid shoes/slippers when out of bed  Taken 04/19/2023 2200 by Gracy Racer, RN  Safety Interventions:   fall reduction program maintained   low bed   nonskid shoes/slippers when out of bed  Taken 04/19/2023 2000 by Gracy Racer, RN  Safety Interventions:   fall reduction program maintained   low bed   nonskid shoes/slippers when out of bed

## 2023-04-20 NOTE — Unmapped (Signed)
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 male with a medical history significant for MASLD cirrhosis, type 2 diabetes, HCC, Crohn' s/p left hemicolectomy and end colostomy in 1995. Since then, patient has had recurrent small bowel obstructions managed non-operatively. He was admitted on 04/12/2023 for another small bowel obstruction. He underwent a successful gastrografin challenge on 5/9, with return of bowel function. Nasogastric tube has been removed on 5/10.    Subjective   NAEO. Tolerated surgical GI diet without n/v yesterday. Overnight EC fistula site was noted to begin leaking dark red liquid with some blood clots. Per patient, this has never happened before.    Plan   Neuro/Psych:  - PO oxycodone PRN pain  - palliative care consult for goals of care discussion given lucidity, likeihood of recurrent obstructions in the future    CV/Pulm:  - HDS, oxygenating well on RA  #CHF, recent echo 11/16/2022 with EF 55%  - Restart home Spironolactone    FEN/GI:   #SBO resolving  - IVF: ML  - continue daily labs, replete electrolytes PRN  - diet: Surgical GI  - s/p gastrografin study on 5/9, with return of bowel function  - NGT removed 5/10  - strict I/O's    #Enterocutaneous fistula POA  - Actively draining, appears succus in nature; maroon 5/12  - Maintain wound manager    #Decompensated MASLD cirrhosis, HCC  - daily weights  - continue rifaximin BID. If unable to take PO medicines, consider lactulose enema   - hepatology signed off    Renal/GU:  #CKD stage 3, creatinine stable  - voiding spontaneously    Endo:  #type 2 diabetes  - SSI, POCT glucose checks    Heme/ID:  - CBC today given new blood from ostomy  - leukocytosis improving, continue daily labs    Prophylaxis: subcutaneous heparin 5,000 units TID, SCDs, out of bed/ambulate TID, IS, PPI    Access: peripheral IV    Dispo: floor status  - home health for therapies confirmed with [facility]    Objective   Vital signs over the last 24 hours:  Temp:  [36.3 ??C (97.4 ??F)-36.7 ??C (98 ??F)] 36.7 ??C (98 ??F)  Heart Rate:  [60-92] 84  Resp:  [17-18] 18  BP: (120-140)/(57-95) 126/63  MAP (mmHg):  [77-106] 79  SpO2:  [95 %-99 %] 95 %    Physical Exam:  Gen: alert, in mild distress  CV: regular rate, normotensive  Pulm: breathing comfortably on room air  Abd: non-distended, and minimally tender to palpation. Colostomy stoma pink, moist, nonprolapsed. Ostomy bag with brown chunky output. Wound manager over old EC fistula with maroon, liquid output  Ext: warm and well perfused bilaterally, no edema

## 2023-04-20 NOTE — Unmapped (Signed)
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 *      OR Procedures:  None     Ancillary Procedures: no procedures    Discharge Day Services: The patient was seen on the day of discharge by the Colorectal team. VS and assessments were stable. All discharge instructions were reviewed and all questions were answered. Incision care was taught and reviewed by the nursing staff prior to discharge. See below for assessment specifics.     Subjective   No acute events overnight. Pain Controlled. No fever or chills.    Objective   Patient Vitals for the past 8 hrs:   BP Temp Temp src Pulse Resp SpO2   04/20/23 1200 113/64 36.4 ??C (97.6 ??F) Oral 89 18 98 %   04/20/23 0800 125/64 36.6 ??C (97.8 ??F) Oral 74 18 98 %     I/O this shift:  In: 240 [P.O.:240]  Out: 875 [Urine:225; Drains:425; Stool:225]    Gen: alert, in no distress  CV: regular rate, normotensive  Pulm: breathing comfortably on room air  Abd: non-distended, and minimally tender to palpation. Colostomy stoma pink, moist, nonprolapsed. Ostomy bag with brown chunky output. Wound manager over old EC fistula with maroon, liquid output  Ext: warm and well perfused bilaterally, no edema     Hospital Course:  Thomas Rosales is a 82 y.o. 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     The patient was maintained on bowel rest with NG tube decompression and supportive care with anti-emetics, IV fluid resuscitation, and electrolyte repletion. Hepatology and gastroenterology were consulted on admission, recommendations were appreciated and implemented.     The patient responded well to conservative therapy, with decreasing nausea and distention. He began to pass flatus and underwent a gastrografin challenge on 5/9. NGT was removed on 5/10 and his diet was restarted with clears and gently advanced. At the time of discharge, he is tolerating a regular diet and reports improvement of their previous obstructive symptoms. During the course of this inpatient stay, he has remained clinically stable. Home health therapy was coordinated by case management prior to discharge. The patient is being discharged on 04/20/23.    Condition at Discharge: Improved  Discharge Medications:      Medication List      CHANGE how you take these medications     STELARA 45 mg/0.5  mL Soln; Generic drug: ustekinumab; 90mg  subcutaneous   every 8weeks; What changed: how much to take, how to take this     CONTINUE taking these medications     cholecalciferol (vitamin D3-25 mcg (1,000 unit)) 25 mcg (1,000 unit)   capsule   EPINEPHrine 0.3 mg/0.3 mL injection; Commonly known as: EPIPEN   FREESTYLE LITE METER kit; Generic drug: blood-glucose meter; Use as   instructed   FREESTYLE LITE STRIPS Strp; Generic drug: blood sugar diagnostic; Check   blood sugar as directed once a day and for symptoms of high or low blood sugar.   lancets 28 gauge Misc; Commonly known as: freestyle; Check blood sugar   as directed once a day and for symptoms of high or low blood sugar.   magnesium oxide 250 mg (150 mg elemental) tablet   multivitamin per tablet; Commonly known as: TAB-A-VITE/THERAGRAN   pantoprazole 40 MG tablet; Commonly known as: Protonix; Take 1 tablet   (40 mg total) by mouth daily.   PRESERVISION AREDS 2 ORAL   rifAXIMin 550 mg Tab; Commonly known as: XIFAXAN; Take 1 tablet (550 mg   total) by mouth two (2) times a day.   spironolactone 50 MG tablet; Commonly known as: ALDACTONE; Take 1 tablet   (50 mg total) by mouth daily.   syringe (disposable) 1 mL Syrg; To be used with doxepin oral solution.     ASK your doctor about these medications     loperamide 2 mg capsule; Commonly known as: IMODIUM; Take 1 capsule (2   mg total) by mouth two (2) times a day. You may increase up to 2 capsules   four times a day to keep ostomy output around 1 L/day; Ask about: Should I   take this medication?       Pending Test Results:     Discharge Instructions:  Activity:     Diet:    Other Instructions:  Other Instructions       Discharge instructions      What to expect after discharge from Colorectal Surgery      You were admitted for management of a: Small bowel obstruction    Your surgeon is: Dr. Milbert Coulter    To contact your Nurse Coordinator, call: Everlene Balls at [phone]    To contact the [facility] (GI Surgery), call: [phone]    To contact your Home Health agency, call: [facility] at [phone]      [facility] next? Your recovery continues at home  Recovery does not end once you leave the hospital. At your next place of recovery, you will continue to make progress - your appetite and energy level will slowly improve. You may have an occasional bad day where you don't feel as good as you did the day before, but then have a few good days to follow. Overall, you should be improving. If you think you are having too many bad days, something may be wrong and we want to hear from you.    Activity and Diet  ?? You should continue walking, climbing stairs, and may ride in a car.   ?? You should not drive if you are taking prescription pain medications (if you were driving prior to coming to the hospital). We will discuss when you can drive again at your follow-up visit.  ?? You should avoid strenuous activity, exercise or heavy lifting (more than 10-15 pounds) for 2 weeks after surgery.   ?? You can shower after discharge,  but do not submerge surgical incisions or wounds in any water for 2 weeks.   ?? You can continue to consume a regular diet after discharge.  ?? We will discuss when you can return to full activities, without restrictions, at your follow up appointment.    Ostomy Care  ?? Your bowel function may be unpredictable at first, but will become more predictable with time.   ?? If you have any questions regarding your ostomy care, supplies or need instructions on managing leaks or other issues, please call your Nurse Coordinator.  ?? While living with an ostomy, you are at high risk for dehydration. Because of this, we will teach you to monitor your ostomy and urine output and look out for signs of dehydration.   ?? If you are dehydrated, you might notice your urine is dark in color, or that you are not urinating as often as you normally do. You might feel lightheaded, dizzy or weak. If you think you are dehydrated, drink more fluids and call your Nurse Coordinator.  ?? In the hospital, you were instructed on how to measure your 24-hour ostomy output using a measuring device and a log. Please continue to do this until your first follow-up appointment with your surgeon.   ?? If you output is ever over 1.5 L (liters) in a 24-hour period, or you have not passed any gas or stool through your ostomy, please call your Nurse Coordinator for further instructions.  ?? The resources listed below are free of charge and designed to meet your ongoing ostomy needs. The programs offer a connection to a certified WOCN, who can advise you on your ostomy care and provide options for financial assistance if applicable.   o [employer] at [phone], available Monday-Friday 8:00am-6:00pm (closed 12-1pm for lunch) Central Time  Email: [email or web address]  Website: [email or web address]   o [employer] at [phone] (new patient enrollment) or [phone] (established patients), available Monday-Friday 8:30am-7:00pm  Email: [email or web address]  Website: [email or web address]   o [employer] at [phone], available Monday-Friday 8:00am-6:00pm  Email: [email or web address]  Website: [email or web address]    Medications  ?? This After Visit Summary contains a list of all medications that you may have started, need to stop, or will need to change when you get home. Please call your Nurse Coordinator if you have any questions about your medication list.   ?? You will still have some pain and discomfort when you are discharged, this is normal and expected. As time passes and you become more active, this will slowly improve.   ?? If you are still having pain at the time of discharge, you can continue to take Tylenol (available over-the-counter) every 4-6 hours following the instructions on the bottle. This will help to reduce the amount of prescription medication you will need to control your pain during recovery. If you have been told that you should not take Tylenol by any of your doctors, please call our office to discuss this medication before you take it.    Reasons To Call After Discharge  ?? You have a fever, with a temperature more than 101.5 F.  ?? You have new nausea and/or vomiting that you did not have in the hospital.  ?? You have worsening nausea and/or vomiting than what you were experiencing in the hospital.  ?? You are not eating or drinking because of having nausea and/or vomiting.  ?? Your pain is not improving and is getting worse.  ?? You feel dehydrated. Signs of dehydration include  feeling light-headed, dizzy, or weak; your urine is very dark and you are only voiding in small volumes; your mouth/tongue feel dry despite drinking water; you are experiencing a racing heart rate and/or low blood pressure.  ?? If you have an ostomy and you are having more than 1.5 L (liters) of stool output in 24 hours.  ?? You have any new or lingering concerns and you are not sure if you should be worried about them.    Who To Contact After Discharge  ?? For all questions/concerns during business hours (Monday - Friday between 8:00 AM - 4:30 PM), call your Nurse Coordinator Everlene Balls at [phone].    o If you are unable to reach your Nurse Coordinator directly during business hours, you may call the [facility] (GI Surgery) at [phone].  ?? For urgent concerns after business hours, call the [facility] Operator at [phone] and ask for the Surgical Resident On Call.   o You will be directed to a surgery resident who is likely not familiar with your specific care, but can help you deal with issues that cannot wait until regular business hours. Please be aware that the person is responding to many in-hospital emergencies and patient issues, and may not answer your phone call immediately.    Follow Up Appointment  ?? At discharge, you will receive a follow up clinic appointment to see your surgeon, usually within 2-3 weeks. The purpose of this visit is to make sure you are healing and recovering.   ?? If you do not get this appointment on discharge, you should call your Nurse Coordinator or the [facility] - GI Surgery within 3 days to schedule it.          Labs and Other Follow-ups after Discharge:  Follow Up instructions and Outpatient Referrals     Ambulatory referral to Home Health      Is this a [facility] Patient?: No    Physician to follow patient's care: Referring Provider    Disciplines requested:  Physical Therapy  Occupational Therapy       Physical Therapy requested:  Home safety evaluation  Evaluate and treat       Occupational Therapy Requested:  Home safety evaluation  Evaluate and treat       Requested SOC Date:  Comment - within 48h of discharge    Do you want ongoing co-management?: Yes    Care coordination required?: No    Discharge instructions          Future Appointments:  Appointments which have been scheduled for you      Jun 05, 2023 12:30 PM  (Arrive by 12:15 PM)  RETURN HEPATOLOGY OTHER with Mickie Hillier, MD  [place] [facility] [address] [facility] [place] [facility]) [address]  [address]  [place] [address]  [phone]        Oct 06, 2023 1:30 PM  (Arrive by 1:15 PM)  RETURN IBD with Luanne Bras, MD  [facility] GI MEDICINE [place] [facility]) [address]  [facility] [address]  [place] [address]  [phone]

## 2023-04-21 NOTE — Unmapped (Signed)
Ivs removed  DC education provided  MD at bedside  Left floor with pt transport        Problem: Adult Inpatient Plan of Care  Goal: Plan of Care Review  Outcome: Resolved  Goal: Patient-Specific Goal (Individualized)  Outcome: Resolved  Goal: Absence of Hospital-Acquired Illness or Injury  Outcome: Resolved  Intervention: Prevent and Manage VTE (Venous Thromboembolism) Risk  Recent Flowsheet Documentation  Taken 04/20/2023 1800 by Caffie Pinto, RN  Anti-Embolism Device Type: SCD, Knee  Anti-Embolism Intervention: Refused  Anti-Embolism Device Location: BLE  Taken 04/20/2023 1600 by Caffie Pinto, RN  Anti-Embolism Device Type: SCD, Knee  Anti-Embolism Intervention: Refused  Anti-Embolism Device Location: BLE  Taken 04/20/2023 1400 by Caffie Pinto, RN  Anti-Embolism Device Type: SCD, Knee  Anti-Embolism Intervention: Refused  Anti-Embolism Device Location: BLE  Taken 04/20/2023 1200 by Caffie Pinto, RN  Anti-Embolism Device Type: SCD, Knee  Anti-Embolism Intervention: Refused  Anti-Embolism Device Location: BLE  Taken 04/20/2023 1000 by Caffie Pinto, RN  Anti-Embolism Device Type: SCD, Knee  Anti-Embolism Intervention: Refused  Anti-Embolism Device Location: BLE  Taken 04/20/2023 0800 by Caffie Pinto, RN  Anti-Embolism Device Type: SCD, Knee  Anti-Embolism Intervention: Refused  Anti-Embolism Device Location: BLE  Goal: Optimal Comfort and Wellbeing  Outcome: Resolved  Goal: Readiness for Transition of Care  Outcome: Resolved  Goal: Rounds/Family Conference  Outcome: Resolved     Problem: Fall Injury Risk  Goal: Absence of Fall and Fall-Related Injury  Outcome: Resolved

## 2023-04-22 NOTE — Unmapped (Signed)
Thomas Rosales with [facility] called stating there was a delay of care requested by patient. The first visit is scheduled for 05/15 which should have been 05/12 or 04/21/2023

## 2023-04-28 DIAGNOSIS — R188 Other ascites: Principal | ICD-10-CM

## 2023-04-28 DIAGNOSIS — C220 Liver cell carcinoma: Principal | ICD-10-CM

## 2023-04-28 DIAGNOSIS — K7460 Unspecified cirrhosis of liver: Principal | ICD-10-CM

## 2023-04-28 DIAGNOSIS — K7581 Nonalcoholic steatohepatitis (NASH): Principal | ICD-10-CM

## 2023-04-29 NOTE — Unmapped (Signed)
Inetta Fermo from [facility] is requesting verbal orders for skilled nursing. The frequency she said for nursing  was 1 time for 1 week, 2 times a week for 2 weeks, 1 time a week for 6 weeks and then 2 PRN visists. This is to assist with colostomy care and management. Her best call back number is [phone]

## 2023-04-30 NOTE — Unmapped (Signed)
Spoke with Inetta Fermo to provide verbal orders for skilled nursing. No further action needed.

## 2023-04-30 NOTE — Unmapped (Signed)
Ester from [facility] called in today to inform the PCP that patient wife declined the OT Eval.    Ester's best call back number is [phone]

## 2023-05-07 ENCOUNTER — Ambulatory Visit: Admit: 2023-05-07 | Discharge: 2023-05-07 | Payer: MEDICARE

## 2023-05-07 NOTE — Unmapped (Unsigned)
[facility]  Outpatient Clinic Note     Patient Name: Thomas Rosales   MRN #: [id number]   Date of Service: May 07, 2023    Admitting Physician: Sharyn Lull,*     Chief Complaint: Recurrent SBOs in Crohn's      Reason for Visit   Follow up after discharge for SBO managed non-operatively.     Assessment   Thomas Rosales is a 82 y.o. 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     He is doing well since discharge, eating normally with adequate output from his colostomy, EC fistula is not currently draining. Working with in home PT to regain strength.     Plan   Return to clinic PRN    Subjective   HPI: Thomas Rosales is a 82 y.o. 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     Admitted 5/4-5/12 for SBO that resolved with [facility].     He is doing well since discharge, eating normally with adequate output from his colostomy, EC fistula is not currently draining. Working with in home PT to regain strength. Endorses some weakness following discharge from the hospital. He reports no episodes of obstructions since discharge. Has gained some weight since hospital discharge, now weight is stable.     Review of Systems: A 12 system review of systems was negative except as noted in the HPI.        Objective     Vitals:    05/07/23 0948   BP: (P) 132/62   Pulse: (P) 79   Temp: 36.6 ??C (97.8 ??F)       Physical Exam:  General: alert, well appearing, in no acute distress  Cardiovascular: regular rate, normotensive  Pulmonary: breathing comfortably on room air  Abdominal: soft, nondistended, and nontender to palpation.  Midline incision well healed . Colostomy stoma pink, moist, nonprolapsed and stool in appliance. Midline EC fistula closed   Extremities: warm and well perfused bilaterally, no edema  Skin: skin color, texture, and turgor are normal. No visible rashes or suspicious lesions  Musculoskeletal: no joint tenderness, deformity, swelling, or muscular tenderness noted. Full range of motion without pain  Neurologic: AAOx3. No visible tremor. Grossly intact neurologically  Psychiatric: alert and oriented to person, place, and time. Judgement and insight are appropriate

## 2023-05-12 DIAGNOSIS — K7460 Unspecified cirrhosis of liver: Principal | ICD-10-CM

## 2023-05-12 DIAGNOSIS — R188 Other ascites: Principal | ICD-10-CM

## 2023-05-12 DIAGNOSIS — K7581 Nonalcoholic steatohepatitis (NASH): Principal | ICD-10-CM

## 2023-05-12 DIAGNOSIS — C220 Liver cell carcinoma: Principal | ICD-10-CM

## 2023-05-15 LAB — COMPREHENSIVE METABOLIC PANEL
ALBUMIN: 3.2 g/dL — ABNORMAL LOW (ref 3.4–5.0)
ALKALINE PHOSPHATASE: 216 U/L — ABNORMAL HIGH (ref 46–116)
ALT (SGPT): 45 U/L (ref 10–49)
ANION GAP: 7 mmol/L (ref 5–14)
AST (SGOT): 51 U/L — ABNORMAL HIGH (ref <=34)
BILIRUBIN TOTAL: 0.9 mg/dL (ref 0.3–1.2)
BLOOD UREA NITROGEN: 24 mg/dL — ABNORMAL HIGH (ref 9–23)
BUN / CREAT RATIO: 26
CALCIUM: 9.2 mg/dL (ref 8.7–10.4)
CHLORIDE: 114 mmol/L — ABNORMAL HIGH (ref 98–107)
CO2: 20.4 mmol/L (ref 20.0–31.0)
CREATININE: 0.93 mg/dL
EGFR CKD-EPI (2021) MALE: 83 mL/min/{1.73_m2} (ref >=60)
GLUCOSE RANDOM: 123 mg/dL (ref 70–179)
POTASSIUM: 4.2 mmol/L (ref 3.4–4.8)
PROTEIN TOTAL: 8 g/dL (ref 5.7–8.2)
SODIUM: 141 mmol/L (ref 135–145)

## 2023-05-15 LAB — CBC W/ AUTO DIFF
BASOPHILS ABSOLUTE COUNT: 0.1 10*9/L (ref 0.0–0.1)
BASOPHILS RELATIVE PERCENT: 1.1 %
EOSINOPHILS ABSOLUTE COUNT: 0.1 10*9/L (ref 0.0–0.5)
EOSINOPHILS RELATIVE PERCENT: 1.3 %
HEMATOCRIT: 38.4 % — ABNORMAL LOW (ref 39.0–48.0)
HEMOGLOBIN: 13.1 g/dL (ref 12.9–16.5)
LYMPHOCYTES ABSOLUTE COUNT: 1 10*9/L — ABNORMAL LOW (ref 1.1–3.6)
LYMPHOCYTES RELATIVE PERCENT: 9.2 %
MEAN CORPUSCULAR HEMOGLOBIN CONC: 34 g/dL (ref 32.0–36.0)
MEAN CORPUSCULAR HEMOGLOBIN: 31.7 pg (ref 25.9–32.4)
MEAN CORPUSCULAR VOLUME: 93.4 fL (ref 77.6–95.7)
MEAN PLATELET VOLUME: 9.1 fL (ref 6.8–10.7)
MONOCYTES ABSOLUTE COUNT: 0.9 10*9/L — ABNORMAL HIGH (ref 0.3–0.8)
MONOCYTES RELATIVE PERCENT: 8.5 %
NEUTROPHILS ABSOLUTE COUNT: 8.7 10*9/L — ABNORMAL HIGH (ref 1.8–7.8)
NEUTROPHILS RELATIVE PERCENT: 79.9 %
NUCLEATED RED BLOOD CELLS: 0 /100{WBCs} (ref <=4)
PLATELET COUNT: 195 10*9/L (ref 150–450)
RED BLOOD CELL COUNT: 4.12 10*12/L — ABNORMAL LOW (ref 4.26–5.60)
RED CELL DISTRIBUTION WIDTH: 16.6 % — ABNORMAL HIGH (ref 12.2–15.2)
WBC ADJUSTED: 10.9 10*9/L (ref 3.6–11.2)

## 2023-05-15 LAB — LACTATE, VENOUS, WHOLE BLOOD: LACTATE BLOOD VENOUS: 0.9 mmol/L (ref 0.5–1.8)

## 2023-05-16 ENCOUNTER — Ambulatory Visit: Admit: 2023-05-16 | Discharge: 2023-05-18 | Disposition: A | Discharge status: Home / Self Care | Payer: MEDICARE

## 2023-05-16 MED ADMIN — iohexol (OMNIPAQUE) 350 mg iodine/mL solution 100 mL: 100 mL | INTRAVENOUS | @ 07:00:00 | Stop: 2023-05-16

## 2023-05-16 MED ADMIN — enoxaparin (LOVENOX) syringe 40 mg: 40 mg | SUBCUTANEOUS | @ 13:00:00

## 2023-05-16 MED ADMIN — lactated Ringers infusion: 100 mL/h | INTRAVENOUS | @ 13:00:00

## 2023-05-16 MED ADMIN — pantoprazole (Protonix) injection 40 mg: 40 mg | INTRAVENOUS | @ 13:00:00

## 2023-05-16 MED ADMIN — HYDROmorphone (PF) injection Syrg 0.5 mg: .5 mg | INTRAVENOUS | @ 22:00:00 | Stop: 2023-05-30

## 2023-05-16 MED ADMIN — rifAXIMin (XIFAXAN) tablet 550 mg: 550 mg | ORAL | @ 13:00:00 | Stop: 2023-08-23

## 2023-05-16 MED ADMIN — acetaminophen (OFIRMEV) 10 mg/mL injection 1,000 mg: 1000 mg | INTRAVENOUS | @ 19:00:00 | Stop: 2023-05-17

## 2023-05-16 MED ADMIN — spironolactone (ALDACTONE) tablet 50 mg: 50 mg | ORAL | @ 13:00:00

## 2023-05-16 MED ADMIN — acetaminophen (OFIRMEV) 10 mg/mL injection 1,000 mg: 1000 mg | INTRAVENOUS | @ 13:00:00 | Stop: 2023-05-16

## 2023-05-16 NOTE — Unmapped (Signed)
Pt has hx of crohn's, pt has an ostomy states has not had any output since this morning. Pt has had bowel obstructions previously. Pt reports he feels he has one now. Pt has tried laxatives and has not helped

## 2023-05-16 NOTE — Unmapped (Signed)
Patient to [facility] er from [facility] for CT scan to evaluate for a bowel blockage.

## 2023-05-16 NOTE — Unmapped (Signed)
Surgery Consult Note History and Physical    05/16/2023    Requesting Attending Physician:  Merrie Roof, MD  Service Requesting Consult: Emergency Medicine    Room: 10-P/10-P    Assessment/Plan:    Thomas Rosales is a 82 y.o. 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 CT shows loop adhered to the anterior abdominal wall.    - Admit to lower GI surgery, floor status  - NPO/NGT, consider GG study  - mIVF  - MMPC: tylenol, dilaudid  - Start home meds: sliding scale insulin, protonix, spironolactone, rifaximin    - Previously needed rifaximin enemas    Plan was discussed with Dr. Neysa Hotter. Thank you for this consult. You may page the GI Surgery consult pager at [phone] with any further questions or for changes in clinical condition.    History of Present Illness:     Thomas Rosales is a 81 y.o. male who is seen in consultation for SBO. Notes having 5 SBOs in the past year, 2-3 requiring hospitalization. Last hospitalized for SBO on 04/12/23, resolution after GG study.    Patient reports that around 5 PM yesterday he felt pain in his abdomen similar to his previous SBO episodes. He drank 2 tbsp of olive oil but this did not improve his symptoms. His abdominal pain got worse over the course of the night and his abdomen became very distended and hard as a rock. Pain increased to 9/10 in severity and is worse with movement. No pain when he is not moving. He also denies any gas or stool from his colostomy bag since ~6 PM yesterday. Denies any N/V. His last meal was breakfast yesterday and he also ate a popsicle before the pain started.     He went to [facility] ED and was then sent to [facility] ED for a CT scan (completed). His wife gave him 1/2 ml of 10mg  solution of morphine at 10 PM at home and this was the last time he received pain meds. His pain has improved since he has been in the ED at [facility].    He was recently admitted from 5/4-5/12 for SBO. This resolved after GGT.    Abdominal surgical history:  08/11/18: I&D of midline wound  06/02/18: laparoscopic parastomal hernia repair  06/01/08: I&D of abscess adjacent to stoma  1995: Ileocecectomy, ileocolonic anastomosis, L/sigmoid colectomy and end colostomy  1973: dx ileocolonic Crohn's disease with perianal fistula    Allergies  Patient has no known allergies.      Medications    No current facility-administered medications on file prior to encounter.     Current Outpatient Medications on File Prior to Encounter   Medication Sig    blood sugar diagnostic (GLUCOSE BLOOD) Strp Check blood sugar as directed once a day and for symptoms of high or low blood sugar.    blood-glucose meter kit Use as instructed    cholecalciferol, vitamin D3-25 mcg, 1,000 unit,, 25 mcg (1,000 unit) capsule Take 1 capsule (25 mcg total) by mouth two (2) times a day.    EPINEPHrine (EPIPEN) 0.3 mg/0.3 mL injection Inject 0.3 mL (0.3 mg total) under the skin once.    lancets (FREESTYLE) 28 gauge Misc Check blood sugar as directed once a day and for symptoms of high or low blood sugar.    magnesium oxide  250 mg (150 mg elemental) tablet Take 1 tablet by mouth two (2) times a day.    multivitamin (TAB-A-VITE/THERAGRAN) per tablet Take 1 tablet by mouth daily.    pantoprazole (PROTONIX) 40 MG tablet Take 1 tablet (40 mg total) by mouth daily.    rifAXIMin (XIFAXAN) 550 mg Tab Take 1 tablet (550 mg total) by mouth two (2) times a day.    spironolactone (ALDACTONE) 50 MG tablet Take 1 tablet (50 mg total) by mouth daily.    syringe, disposable, 1 mL Syrg To be used with doxepin oral solution.    ustekinumab (STELARA) 45 mg/0.5 mL Soln 90mg  subcutaneous every 8weeks (Patient taking differently: Inject 90 mg under the skin. 90mg  subcutaneous every 8weeks)    vit C/E/Zn/coppr/lutein/zeaxan (PRESERVISION AREDS 2 ORAL) Take 1 tablet by mouth two (2) times a day.         Past Medical History  Past Medical History:   Diagnosis Date    Anal fissure 1983    Apnea, sleep     uses CPAP at night. instructed to bring DOS.     Basal cell carcinoma     Cancer (CMS-HCC)     skin cancer    CHF (congestive heart failure) (CMS-HCC)     Cirrhosis (CMS-HCC) 04/15/2023    Crohn's disease (CMS-HCC)     Diverticulitis of colon     Fatty liver     Gallstones     GERD (gastroesophageal reflux disease)     H/O echocardiogram     Hypertension     Irritable bowel syndrome     Kidney stone     NASH (nonalcoholic steatohepatitis)     Syncope     dizziness    Type 2 diabetes mellitus, without long-term current use of insulin (CMS-HCC) 10/24/2022         Past Surgical History  Past Surgical History:   Procedure Laterality Date    APPENDECTOMY      BOWEL RESECTION  1981, 1984, 1995    3 ft small intestine, <1 ft large intestines, cecum removed; 2nd bowel resection anal fistulas removed; 3rd bowel resection most of colon removed, colostomy placed.    CARDIAC CATHETERIZATION      CARDIOVASCULAR STRESS TEST      CHOLECYSTECTOMY      COLON SURGERY      Same as small bowel surgery    COLOSTOMY  1995    EYE SURGERY  1/20 & 4/21    Cataract & vitrious removal    FRACTURE SURGERY  1961    Broken tibia and fibula    HERNIA REPAIR  2019    Done by Dr Day    LAPAROSCOPIC SMALL BOWEL RESECTION      LEG SURGERY Left     pins in shin    LITHOTRIPSY      PR COLONOSCOPY FLX DX W/COLLJ SPEC WHEN PFRMD  03/19/2018    Procedure: COLONOSCOPY WITH POSSIBLE BIOPSY, POSSIBLE POLYPECTOMY.;  Surgeon: Tressie Ellis, MD;  Location: ENDO OR [facility];  Service: Gastroenterology    PR COLSC FLEXIBLE W/TRANSENDOSCOPIC BALLOON DILAT N/A 09/13/2022    Procedure: COLONOSCOPY, FLEXIBLE; WITH DILATION BY BALLOON, 1 OR MORE STRICTURES;  Surgeon: Luanne Bras, MD;  Location: [facility] PROCEDURES [facility];  Service: Gastroenterology    PR INCISION & DRAINAGE ABSCESS SIMPLE/SINGLE N/A 08/11/2018 Procedure: INCISION AND DRAINAGE OF ABDOMINAL WALL ABSCESS;  Surgeon: Jerry Caras Day, MD;  Location: OR [place];  Service: General Surgery    PR  LAP, INCISIONAL HERNIA REPAIR,REDUCIBLE N/A 06/02/2018    Procedure: LAPAROSCOPIC ASSISTED PARASTOMAL HERNIA REPAIR AND INCISIONAL HERNIA REPAIR WITH MESH;  Surgeon: Jerry Caras Day, MD;  Location: OR [place];  Service: General Surgery    PR VITRECTOMY,MECHANICAL Right 03/28/2020    Procedure: trans pars planar vitrectomy, right eye   ;  Surgeon: Emi Holes, MD;  Location: [facility] OR [place];  Service: Ophthalmology    PR XCAPSL CTRC RMVL INSJ IO LENS PROSTH W/O ECP Right 01/13/2019    Procedure: PHACOEMULSIFICATION OF CATARACT WITH INSERTION OF INTRAOCULAR LENS/TORIC LENS/ORA;  Surgeon: Juanna Cao, MD;  Location: OR [facility];  Service: Ophthalmology    PR XCAPSL CTRC RMVL INSJ IO LENS PROSTH W/O ECP Left 01/27/2019    Procedure: PHACOEMULSIFICATION OF CATARACT WITH INSERTION OF INTRAOCULAR LENS/TORIC LENS/ORA;  Surgeon: Juanna Cao, MD;  Location: OR [facility];  Service: Ophthalmology    RESECTION SMALL BOWEL / CLOSURE ILEOSTOMY  1981    temporary ileostomy     REVISION COLOSTOMY  2009    SMALL INTESTINE SURGERY  3x since 1984    Crohn???s disease    URETERAL STENT PLACEMENT  2008         Family History  Family History   Problem Relation Age of Onset    Heart disease Mother         pacemaker    Arthritis Mother     Dementia Father     Cancer Father          Social History:  Social History     Tobacco Use    Smoking status: Former     Current packs/day: 0.00     Average packs/day: 1 pack/day for 20.0 years (20.0 ttl pk-yrs)     Types: Cigarettes     Start date: 12/09/1964     Quit date: 12/09/1984     Years since quitting: 38.4    Smokeless tobacco: Never   Vaping Use    Vaping status: Never Used   Substance Use Topics    Alcohol use: Not Currently     Comment: occassionally    Drug use: No         Review of Systems  A 10 system review of systems was negative except as noted in HPI.    Vital Signs  BP 145/68  - Pulse 74  - Temp 36.4 ??C (97.6 ??F) (Oral)  - Resp 15  - Wt 68.9 kg (151 lb 12.8 oz)  - SpO2 97%  - BMI 24.50 kg/m??   Body mass index is 24.5 kg/m??.    Physical Exam  General: Cooperative, no distress, well appearing  HEENT: Normocephalic, atraumatic, EOMI, no scleral icterus  Pulmonary: Normal work of breathing, equal bilateral chest rise  Cardiovascular: Radial pulses 2+  Abdomen: soft, mildly distended, TTP at midline. Two healed EC fistula holes midline to stoma, no drainage.  Musculoskeletal: Good range of motion in bilateral upper/lower extremities.  Skin: No jaundice, rashes, erythema, or lesions. Skin color and texture normal.  Neurologic: Alert and interactive, no motor abnormalities noted. Sensation grossly intact.      Test Results  All lab results last 24 hours:    Recent Results (from the past 24 hour(s))   Comprehensive Metabolic Panel    Collection Time: 05/15/23 11:25 PM   Result Value Ref Range    Sodium 141 135 - 145 mmol/L    Potassium 4.2 3.4 - 4.8 mmol/L    Chloride 114 (H) 98 - 107 mmol/L  CO2 20.4 20.0 - 31.0 mmol/L    Anion Gap 7 5 - 14 mmol/L    BUN 24 (H) 9 - 23 mg/dL    Creatinine 1.61 0.96 - 1.18 mg/dL    BUN/Creatinine Ratio 26     eGFR CKD-EPI (2021) Male 84 >=60 mL/min/1.53m2    Glucose 123 70 - 179 mg/dL    Calcium 9.2 8.7 - 04.5 mg/dL    Albumin 3.2 (L) 3.4 - 5.0 g/dL    Total Protein 8.0 5.7 - 8.2 g/dL    Total Bilirubin 0.9 0.3 - 1.2 mg/dL    AST 51 (H) <=40 U/L    ALT 45 10 - 49 U/L    Alkaline Phosphatase 216 (H) 46 - 116 U/L   CBC w/ Differential    Collection Time: 05/15/23 11:25 PM   Result Value Ref Range    WBC 10.9 3.6 - 11.2 10*9/L    RBC 4.12 (L) 4.26 - 5.60 10*12/L    HGB 13.1 12.9 - 16.5 g/dL    HCT 98.1 (L) 19.1 - 48.0 %    MCV 93.4 77.6 - 95.7 fL    MCH 31.7 25.9 - 32.4 pg    MCHC 34.0 32.0 - 36.0 g/dL    RDW 47.8 (H) 29.5 - 15.2 %    MPV 9.1 6.8 - 10.7 fL    Platelet 195 150 - 450 10*9/L    nRBC 0 <=4 /100 WBCs    Neutrophils % 79.9 %    Lymphocytes % 9.2 %    Monocytes % 8.5 %    Eosinophils % 1.3 %    Basophils % 1.1 %    Absolute Neutrophils 8.7 (H) 1.8 - 7.8 10*9/L    Absolute Lymphocytes 1.0 (L) 1.1 - 3.6 10*9/L    Absolute Monocytes 0.9 (H) 0.3 - 0.8 10*9/L    Absolute Eosinophils 0.1 0.0 - 0.5 10*9/L    Absolute Basophils 0.1 0.0 - 0.1 10*9/L   Lactate, Venous, Whole Blood    Collection Time: 05/15/23 11:27 PM   Result Value Ref Range    Lactate, Venous 0.9 0.5 - 1.8 mmol/L   COVID-19 PCR    Collection Time: 05/15/23 11:27 PM    Specimen: Nasopharyngeal Swab   Result Value Ref Range    SARS-CoV-2 PCR Negative Negative       Imaging: CT Abdomen Pelvis W IV Contrast    Result Date: 05/16/2023  EXAM: CT ABDOMEN PELVIS W CONTRAST ACCESSION: [id number] UN CLINICAL INDICATION: 82 years old with r/o SBO  COMPARISON: CT abdomen pelvis 5 4024. TECHNIQUE: A helical CT scan of the abdomen and pelvis was obtained following IV contrast from the lung bases through the pubic symphysis. Images were reconstructed in the axial plane. Coronal and sagittal reformatted images were also provided for further evaluation. FINDINGS: LOWER CHEST: Bibasilar subsegmental atelectasis. Coronary atherosclerosis, mild. LIVER: Cirrhotic morphology. Scattered subcentimeter hypodensities that are too small to characterize on CT. hepatic segment 2.3 cm peripherally enhancing lesion, was approximately 2.1 cm (series 2 image 47). BILIARY: Cholecystectomy. Mild intrahepatic biliary duct dilation. SPLEEN: Normal size. PANCREAS: Normal pancreatic contour. 1.2 cm pancreatic tail hypodensity (series 2 image 44), was 0.9 cm. No ductal dilation. ADRENAL GLANDS: Normal appearance of the adrenal glands. KIDNEYS/URETERS: Severe bilateral renal cortical atrophy/thinning. Symmetric background renal enhancement. Bilateral subcentimeter hypodensities that are too small to characterize on CT. Right lower pole cyst. No hydronephrosis. Bilateral calculi measuring up to 2.1 cm in the left and 1.8 cm in the right. BLADDER:  Decompressed limiting evaluation. REPRODUCTIVE ORGANS: Prostatic calcifications. GI TRACT: Sequela of multiple bowel resections, partial colectomy, and small bowel colon anastomosis in the right lower quadrant. Multiple dilated loops of small bowel which are tethered to the anterior abdominal wall with a suspected transition point in the mid lower abdomen (series 2 image 83). Multiple loops of small bowel contain partially fecalized contents. The appendix is not identified. Left lower quadrant colostomy. Colonic diverticulosis. Rectal stump is tethered to the anterior abdominal wall. PERITONEUM, RETROPERITONEUM AND MESENTERY: No free air.  No ascites.  No fluid collection. Surgical clips right lower quadrant. LYMPH NODES: No adenopathy. VESSELS: Hepatic and portal veins are patent.  Normal caliber aorta.  BONES and SOFT TISSUES: No aggressive osseous lesions. Parastomal hernia which contains small bowel and mesenteric fat. Postsurgical changes of the ventral abdominal wall. Multilevel degenerative changes of the spine. Near complete ankylosis of the SI joints. Chronic compression deformity of L5. A few locules of gas along the ventral abdominal wall that cannot be definitively identified within loops of bowel (series 2 image 81).     -Multiple dilated loops of small bowel containing fecalized contents, many loops are adhered to the anterior abdominal wall with a suspected transition point in the inferior abdomen adjacent to the anterior abdominal wall. Findings are worrisome for mechanical small bowel obstruction. -A few locules of gas along the ventral abdominal wall which cannot be definitively identified within loops of bowel, although decreased since May 4, suggests enterocutaneous fistula.          ----  [name], MS4    I attest that I have reviewed the medical student note and that the components of the history of the present illness, the physical exam, and the assessment and plan documented were performed by me or were performed in my presence by the student where I verified the documentation and performed (or re-performed) the exam and medical decision making.      Nicholos Johns, MD

## 2023-05-16 NOTE — Unmapped (Signed)
[facility]  Emergency Department Progress Note for Continuation of Care      May 16, 2023 12:24 AM    Thomas Rosales is a 82 y.o. male who was transported to this emergency department from the [facility] Emergency Department.  I have reviewed the available documentation and test results.     Brief History and Assessment:     Briefly, this is a 82 y.o. 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 Feels similar to prior SBOs.  Sent here for imaging studies.       [facility] ED Course:     ED Course as of 05/16/23 2440   Fri May 16, 2023   0345 CTAP showing:  IMPRESSION:  -Multiple dilated loops of small bowel containing fecalized contents, many loops are adhered to the anterior abdominal wall with a suspected transition point in the inferior abdomen adjacent to the anterior abdominal wall. Findings are worrisome for mechanical small bowel obstruction.     -A few locules of gas along the ventral abdominal wall which cannot be definitively identified within loops of bowel, although decreased since May 4, suggests enterocutaneous fistula.    Surgery paged.   1027 Surgery recommending NG tube, ordered.          ED Clinical Impression     Final diagnoses:   SBO (small bowel obstruction) (CMS-HCC) (Primary)          Melene Plan, MD  Resident  05/16/23 [phone]

## 2023-05-16 NOTE — Unmapped (Signed)
Emergency Medicine Access Physician [facility])  [facility] - Transfer Request Note    Requesting Provider: [facility]: [facility]    Requesting Service: Emergency Medicine      Reason for transfer request: needs CT imaging    Overview of ED course at transferring hospital: Thomas Rosales is a 82 y.o. male with complicated bowel hx with known bowel stricture presents with worsening distension, abdominal pain and no output from ostomy since earlier today. Hx of prior SBO. Symptoms consistent with prior. No CT scanner at [facility] - will plan for transfer.     Was the patient accepted to the [facility] ED in transfer: Yes.  If no, why?: N/A    Accepting service/expected service information:  Has an inpatient or consulting service been contacted by the [facility] or the referring provider?: No.  If admitting/consult service contacted by [facility]:  Time of discussion: N/A  Name/service (e.g. Dr. Floyde Parkins, neprology) of inpatient team member/consultant: N/A  Role of admit/consult service member: N/A  Brief summary of call: N/A  Anticipated Inpatient Bed Type Needed: N/A    Outside Hospital Imaging:  Was Imaging Done at the Referring Hospital:  Yes.  If yes, what studies?:  XRay  [employer]:  [employer], images available in [employer]

## 2023-05-16 NOTE — Unmapped (Addendum)
Thomas Rosales is a 82 y.o. 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 The patient was maintained on bowel rest with NG tube decompression and supportive care with anti-emetics, IV fluid resuscitation, and electrolyte repletion. The patient responded well to conservative therapy, with decreasing nausea and distention. The patient began to pass flatus and have bowel movements; his diet was restarted with clears and gently advanced. At the time of discharge, he is tolerating a regular diet and reports improvement of their previous obstructive symptoms. During the course of this inpatient stay, he has remained clinically stable. The patient is being discharged on ***.

## 2023-05-16 NOTE — Unmapped (Shared)
[facility] Emergency Department Provider Note         ED Clinical Impression     Final diagnoses:   None       Presenting History and MDM     HPI    May 15, 2023 11:22 PM   Thomas Rosales is a 82 y.o. male who  has a past medical history of Anal fissure (1983), Apnea, sleep, Basal cell carcinoma, Cancer (CMS-HCC), CHF (congestive heart failure) (CMS-HCC), Cirrhosis (CMS-HCC) (04/15/2023), Crohn's disease (CMS-HCC), Diverticulitis of colon, Fatty liver, Gallstones, GERD (gastroesophageal reflux disease), H/O echocardiogram, Hypertension, Irritable bowel syndrome, Kidney stone, NASH (nonalcoholic steatohepatitis), Syncope, and Type 2 diabetes mellitus, without long-term current use of insulin (CMS-HCC) (10/24/2022). presenting for evaluation of abdominal pain, distension, and no output from his ostomy since 1400 today. Feels similar to prior SBOs. Known stricture. Managed by lower GI surg.       Initial impression and pertinent physical exam:      BP 158/76  - Pulse 75  - Temp 36.5 ??C (97.7 ??F) (Oral)  - Resp 16  - Wt 68.9 kg (151 lb 12.8 oz)  - SpO2 99%  - BMI 24.50 kg/m??     On initial evaluation, VS ***.      MDM:   ***         Diagnostic Orders:    Orders Placed This Encounter   Procedures    CBC w/ Differential    Comprehensive Metabolic Panel       Medications:  Medications - No data to display    Procedures/Critical Care:    Procedures      Additional Medical Decision Making     {Discussion of Management with other Physicians, QHP or Appropriate Source:94488}  {Independent Interpretation of Studies:94489}  {I have reviewed recent and relevant previous record, including:94490}  {Escalation of Care including OBS/Admission/Transfer was considered:94491}  {Social Determinants that significantly affected care:94492}  {Prescription drugs considered but not prescribed:94493}  {Diagnostic tests considered but not performed:94494}  I have reviewed the vital signs and the nursing notes. Labs and radiology results that were available during my care of the patient were independently reviewed by me and considered in my medical decision making.   I independently visualized the EKG tracing if performed  I independently visualized the radiology images if performed  I reviewed the patient's prior medical records if available.  Additional history obtained from family if available    Other History     CHIEF COMPLAINT:   Chief Complaint   Patient presents with    Constipation       PAST MEDICAL HISTORY/PAST SURGICAL HISTORY:   Past Medical History:   Diagnosis Date    Anal fissure 1983    Apnea, sleep     uses CPAP at night. instructed to bring DOS.     Basal cell carcinoma     Cancer (CMS-HCC)     skin cancer    CHF (congestive heart failure) (CMS-HCC)     Cirrhosis (CMS-HCC) 04/15/2023    Crohn's disease (CMS-HCC)     Diverticulitis of colon     Fatty liver     Gallstones     GERD (gastroesophageal reflux disease)     H/O echocardiogram     Hypertension     Irritable bowel syndrome     Kidney stone     NASH (nonalcoholic steatohepatitis)     Syncope     dizziness    Type 2 diabetes mellitus, without  long-term current use of insulin (CMS-HCC) 10/24/2022       Past Surgical History:   Procedure Laterality Date    APPENDECTOMY      BOWEL RESECTION  1981, 1984, 1995    3 ft small intestine, <1 ft large intestines, cecum removed; 2nd bowel resection anal fistulas removed; 3rd bowel resection most of colon removed, colostomy placed.    CARDIAC CATHETERIZATION      CARDIOVASCULAR STRESS TEST      CHOLECYSTECTOMY      COLON SURGERY      Same as small bowel surgery    COLOSTOMY  1995    EYE SURGERY  1/20 & 4/21    Cataract & vitrious removal    FRACTURE SURGERY  1961    Broken tibia and fibula    HERNIA REPAIR  2019    Done by Dr Day    LAPAROSCOPIC SMALL BOWEL RESECTION      LEG SURGERY Left     pins in shin    LITHOTRIPSY      PR COLONOSCOPY FLX DX W/COLLJ SPEC WHEN PFRMD  03/19/2018    Procedure: COLONOSCOPY WITH POSSIBLE BIOPSY, POSSIBLE POLYPECTOMY.;  Surgeon: Tressie Ellis, MD;  Location: ENDO OR [facility];  Service: Gastroenterology    PR COLSC FLEXIBLE W/TRANSENDOSCOPIC BALLOON DILAT N/A 09/13/2022    Procedure: COLONOSCOPY, FLEXIBLE; WITH DILATION BY BALLOON, 1 OR MORE STRICTURES;  Surgeon: Luanne Bras, MD;  Location: [facility] PROCED[facility];  Service: Gastroenterology    PR INCISION & DRAINAGE ABSCESS SIMPLE/SINGLE N/A 08/11/2018    Procedure: INCISION AND DRAINAGE OF ABDOMINAL WALL ABSCESS;  Surgeon: Jerry Caras Day, MD;  Location: OR [place];  Service: General Surgery    PR LAP, INCISIONAL HERNIA REPAIR,REDUCIBLE N/A 06/02/2018    Procedure: LAPAROSCOPIC ASSISTED PARASTOMAL HERNIA REPAIR AND INCISIONAL HERNIA REPAIR WITH MESH;  Surgeon: Jerry Caras Day, MD;  Location: OR [place];  Service: General Surgery    PR VITRECTOMY,MECHANICAL Right 03/28/2020    Procedure: trans pars planar vitrectomy, right eye   ;  Surgeon: Emi Holes, MD;  Location: [facility] OR ;  Service: Ophthalmology    PR XCAPSL CTRC RMVL INSJ IO LENS PROSTH W/O ECP Right 01/13/2019    Procedure: PHACOEMULSIFICATION OF CATARACT WITH INSERTION OF INTRAOCULAR LENS/TORIC LENS/ORA;  Surgeon: Juanna Cao, MD;  Location: OR [facility];  Service: Ophthalmology    PR XCAPSL CTRC RMVL INSJ IO LENS PROSTH W/O ECP Left 01/27/2019    Procedure: PHACOEMULSIFICATION OF CATARACT WITH INSERTION OF INTRAOCULAR LENS/TORIC LENS/ORA;  Surgeon: Juanna Cao, MD;  Location: OR [facility];  Service: Ophthalmology    RESECTION SMALL BOWEL / CLOSURE ILEOSTOMY  1981    temporary ileostomy     REVISION COLOSTOMY  2009    SMALL INTESTINE SURGERY  3x since 1984    Crohn???s disease    URETERAL STENT PLACEMENT  2008       MEDICATIONS:   No current facility-administered medications for this encounter.    Current Outpatient Medications:     blood sugar diagnostic (GLUCOSE BLOOD) Strp, Check blood sugar as directed once a day and for symptoms of high or low blood sugar., Disp: 100 strip, Rfl: 0    blood-glucose meter kit, Use as instructed, Disp: 1 each, Rfl: 0    cholecalciferol, vitamin D3-25 mcg, 1,000 unit,, 25 mcg (1,000 unit) capsule, Take 1 capsule (25 mcg total) by mouth two (2) times a day., Disp: , Rfl:     EPINEPHrine (EPIPEN) 0.3 mg/0.3 mL  injection, Inject 0.3 mL (0.3 mg total) under the skin once., Disp: , Rfl:     lancets (FREESTYLE) 28 gauge Misc, Check blood sugar as directed once a day and for symptoms of high or low blood sugar., Disp: 100 each, Rfl: 11    magnesium oxide 250 mg (150 mg elemental) tablet, Take 1 tablet by mouth two (2) times a day., Disp: , Rfl:     multivitamin (TAB-A-VITE/THERAGRAN) per tablet, Take 1 tablet by mouth daily., Disp: , Rfl:     pantoprazole (PROTONIX) 40 MG tablet, Take 1 tablet (40 mg total) by mouth daily., Disp: 90 tablet, Rfl: 3    rifAXIMin (XIFAXAN) 550 mg Tab, Take 1 tablet (550 mg total) by mouth two (2) times a day., Disp: 180 tablet, Rfl: 3    spironolactone (ALDACTONE) 50 MG tablet, Take 1 tablet (50 mg total) by mouth daily., Disp: 30 tablet, Rfl: 5    syringe, disposable, 1 mL Syrg, To be used with doxepin oral solution., Disp: 25 each, Rfl: 0    ustekinumab (STELARA) 45 mg/0.5 mL Soln, 90mg  subcutaneous every 8weeks (Patient taking differently: Inject 90 mg under the skin. 90mg  subcutaneous every 8weeks), Disp: 1 mL, Rfl: 5    vit C/E/Zn/coppr/lutein/zeaxan (PRESERVISION AREDS 2 ORAL), Take 1 tablet by mouth two (2) times a day., Disp: , Rfl:     ALLERGIES:   Patient has no known allergies.    SOCIAL HISTORY:   Social History     Tobacco Use    Smoking status: Former     Current packs/day: 0.00     Average packs/day: 1 pack/day for 20.0 years (20.0 ttl pk-yrs)     Types: Cigarettes     Start date: 12/09/1964     Quit date: 12/09/1984     Years since quitting: 38.4    Smokeless tobacco: Never   Substance Use Topics    Alcohol use: Not Currently     Comment: occassionally       FAMILY HISTORY:  Family History   Problem Relation Age of Onset Heart disease Mother         pacemaker    Arthritis Mother     Dementia Father     Cancer Father           Review of Systems    A 10 point review of systems was performed and is negative other than positive elements noted in HPI     Physical Exam   Physical Exam    Radiology     No orders to display       Labs     Labs Reviewed   CBC W/ DIFFERENTIAL    Narrative:     The following orders were created for panel order CBC w/ Differential.                  Procedure                               Abnormality         Status                                     ---------                               -----------         ------  CBC w/ Differential[[id number]]                                                                                          Please view results for these tests on the individual orders.   COMPREHENSIVE METABOLIC PANEL   CBC W/ AUTO DIFF       Please note- This chart has been created using [employer]. Chart creation errors have been sought, but may not always be located and such creation errors, especially pronoun confusion, do NOT reflect on the standard of medical care.

## 2023-05-17 LAB — BASIC METABOLIC PANEL
ANION GAP: 5 mmol/L (ref 5–14)
BLOOD UREA NITROGEN: 20 mg/dL (ref 9–23)
BUN / CREAT RATIO: 21
CALCIUM: 9 mg/dL (ref 8.7–10.4)
CHLORIDE: 112 mmol/L — ABNORMAL HIGH (ref 98–107)
CO2: 22 mmol/L (ref 20.0–31.0)
CREATININE: 0.95 mg/dL
EGFR CKD-EPI (2021) MALE: 81 mL/min/{1.73_m2} (ref >=60)
GLUCOSE RANDOM: 79 mg/dL (ref 70–179)
POTASSIUM: 4.6 mmol/L (ref 3.4–4.8)
SODIUM: 139 mmol/L (ref 135–145)

## 2023-05-17 LAB — PHOSPHORUS: PHOSPHORUS: 3.8 mg/dL (ref 2.4–5.1)

## 2023-05-17 LAB — CBC
HEMATOCRIT: 34.2 % — ABNORMAL LOW (ref 39.0–48.0)
HEMOGLOBIN: 11.7 g/dL — ABNORMAL LOW (ref 12.9–16.5)
MEAN CORPUSCULAR HEMOGLOBIN CONC: 34.4 g/dL (ref 32.0–36.0)
MEAN CORPUSCULAR HEMOGLOBIN: 31.9 pg (ref 25.9–32.4)
MEAN CORPUSCULAR VOLUME: 92.9 fL (ref 77.6–95.7)
MEAN PLATELET VOLUME: 8.8 fL (ref 6.8–10.7)
PLATELET COUNT: 165 10*9/L (ref 150–450)
RED BLOOD CELL COUNT: 3.68 10*12/L — ABNORMAL LOW (ref 4.26–5.60)
RED CELL DISTRIBUTION WIDTH: 16.4 % — ABNORMAL HIGH (ref 12.2–15.2)
WBC ADJUSTED: 10 10*9/L (ref 3.6–11.2)

## 2023-05-17 LAB — MAGNESIUM: MAGNESIUM: 1.8 mg/dL (ref 1.6–2.6)

## 2023-05-17 MED ADMIN — lactated Ringers infusion: 100 mL/h | INTRAVENOUS

## 2023-05-17 MED ADMIN — rifAXIMin (XIFAXAN) tablet 550 mg: 550 mg | ORAL | @ 14:00:00 | Stop: 2023-08-23

## 2023-05-17 MED ADMIN — lactated Ringers infusion: 100 mL/h | INTRAVENOUS | @ 03:00:00

## 2023-05-17 MED ADMIN — spironolactone (ALDACTONE) tablet 50 mg: 50 mg | ORAL | @ 14:00:00

## 2023-05-17 MED ADMIN — pantoprazole (Protonix) injection 40 mg: 40 mg | INTRAVENOUS | @ 14:00:00

## 2023-05-17 MED ADMIN — HYDROmorphone (PF) injection Syrg 0.5 mg: .5 mg | INTRAVENOUS | @ 05:00:00 | Stop: 2023-05-30

## 2023-05-17 MED ADMIN — rifAXIMin (XIFAXAN) tablet 550 mg: 550 mg | ORAL | Stop: 2023-08-23

## 2023-05-17 MED ADMIN — enoxaparin (LOVENOX) syringe 40 mg: 40 mg | SUBCUTANEOUS | @ 14:00:00

## 2023-05-17 NOTE — Unmapped (Signed)
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       Neuro/Psych:  -IV dilaudid prn breakthrough pain  -zofran prn N/V    CV: HDS    Pulm: oxygenating well    FEN/GI:  -IVF @ 11ml/hr  -diet: NPO Sips with meds; Medically necessary   - Clamp trial for 6 hours today, remove NGT if passes  - ROBF with large volume ostomy output this am    Renal: adequate UOP  -voiding w/o difficulty    Endo:  -n/a    Heme: no active s/s bleeding    ID: afebrile    Prophylax: prophy dose of lovenox, OOB, IS    Dispo: floor status      Interval History     NAEON, pain controlled on current regimen, denies N/V, and + BM's    Objective:    Vital signs in last 24 hours:  Temp:  [36.3 ??C (97.3 ??F)-36.5 ??C (97.7 ??F)] 36.3 ??C (97.3 ??F)  Heart Rate:  [63-73] 73  SpO2 Pulse:  [64] 64  Resp:  [16-18] 18  BP: (133-137)/(60-69) 135/69  MAP (mmHg):  [81-87] 87  SpO2:  [97 %-99 %] 99 %    Intake/Output last 3 shifts:  I/O last 3 completed shifts:  In: 1980 [I.V.:1950; NG/GT:30]  Out: 600 [Urine:250; Emesis/NG output:350]    Intake/Output this shift:  No intake/output data recorded.  Intake/Output Table:  I/O         06/06 0701  06/07 0700 06/07 0701  06/08 0700 06/08 0701  06/09 0700 I.V. (mL/kg)  1950 (28.3)     NG/GT  30     Total Intake  1980     Urine (mL/kg/hr)  250 (0.2)     Emesis/NG output  350     Stool  0     Total Output(mL/kg)  600 (8.7)     Net  +1380            Urine Occurrence  2 x     Stool Occurrence  0 x     Emesis Occurrence  0 x               Physical Exam:    Gen: awake, oriented, NAD  CV: reg rate, normotensive  Pulm: no increased work of breathing  Abd: soft, nondistended, and nontender to palpation, stoma pink moist nonprolapsed and appliance intact with large volume stool in bag  Ext: warm and nonedematous    Scheduled Medications:   acetaminophen  1,000 mg Intravenous Q8H    enoxaparin (LOVENOX) injection  40 mg Subcutaneous Q24H    pantoprazole (Protonix) intravenous solution  40 mg Intravenous Daily    rifAXIMin  550 mg Oral BID    spironolactone  50 mg Oral Daily     Continuous Medications:   lactated Ringers 100 mL/hr (05/16/23 2242)     PRN Medications:  HYDROmorphone **OR** [DISCONTINUED] HYDROmorphone, ondansetron, prochlorperazine    Labs were reviewed: Yes    WBC       Lab Results   Component Value Date    WBC 10.0 05/17/2023    HGB 11.7 (L) 05/17/2023    HCT 34.2 (L) 05/17/2023    PLT 165 05/17/2023       Lab Results   Component Value Date    NA 139 05/17/2023    K 4.6 05/17/2023    CL 112 (H) 05/17/2023    CO2 22.0 05/17/2023    BUN 20 05/17/2023    CREATININE 0.95 05/17/2023    CALCIUM 9.0 05/17/2023    MG 1.8 05/17/2023    PHOS 3.8 05/17/2023       Lab Results   Component Value Date    PT 15.0 (H) 04/20/2023    INR 1.35 04/20/2023    APTT 27.7 09/22/2020       Lab Results   Component Value Date    ALKPHOS 216 (H) 05/15/2023    BILITOT 0.9 05/15/2023    BILIDIR 0.70 (H) 04/20/2023    PROT 8.0 05/15/2023    ALBUMIN 3.2 (L) 05/15/2023    ALT 45 05/15/2023    AST 51 (H) 05/15/2023       Lab Results   Component Value Date    ALBUMIN 3.2 (L) 05/15/2023       Radiology reports/images were reviewed: Yes

## 2023-05-17 NOTE — Unmapped (Signed)
Pt seen per a Virtual Admissions request placed by the primary RN.  Pts admission was completed without family at the bedside.  An education assessment and initial education were completed.   Pt advised to use the call bell if they have questions, requests or needs to get out of bed.  Falls education was added to their discharge AVS.  Pt denies having home medications at bedside.  A password was declined at this time and a HCDM was confirmed during the admissions questionnaire.   Pt expressed a desire to change his code status from Full Code.  A page was sent to Valarie Merino to see the patient.     The most recent VS recorded for the Pt are noted below:  BP 137/61  - Pulse 63  - Temp 36.5 ??C (97.7 ??F) (Oral)  - Resp 18  - Wt 68.9 kg (151 lb 12.8 oz)  - SpO2 99%  - BMI 24.50 kg/m??     The Pt did not complain of pain and denies any needs at this time.

## 2023-05-17 NOTE — Unmapped (Signed)
Bed: 66-D  Expected date: 05/16/23  Expected time:   Means of arrival:   Comments:  10P to 66D [facility] RN

## 2023-05-18 LAB — MAGNESIUM: MAGNESIUM: 1.7 mg/dL (ref 1.6–2.6)

## 2023-05-18 LAB — CBC
HEMATOCRIT: 31.9 % — ABNORMAL LOW (ref 39.0–48.0)
HEMOGLOBIN: 10.9 g/dL — ABNORMAL LOW (ref 12.9–16.5)
MEAN CORPUSCULAR HEMOGLOBIN CONC: 34.1 g/dL (ref 32.0–36.0)
MEAN CORPUSCULAR HEMOGLOBIN: 31.9 pg (ref 25.9–32.4)
MEAN CORPUSCULAR VOLUME: 93.4 fL (ref 77.6–95.7)
MEAN PLATELET VOLUME: 9.4 fL (ref 6.8–10.7)
PLATELET COUNT: 151 10*9/L (ref 150–450)
RED BLOOD CELL COUNT: 3.42 10*12/L — ABNORMAL LOW (ref 4.26–5.60)
RED CELL DISTRIBUTION WIDTH: 16.1 % — ABNORMAL HIGH (ref 12.2–15.2)
WBC ADJUSTED: 7.3 10*9/L (ref 3.6–11.2)

## 2023-05-18 LAB — BASIC METABOLIC PANEL
ANION GAP: 2 mmol/L — ABNORMAL LOW (ref 5–14)
BLOOD UREA NITROGEN: 14 mg/dL (ref 9–23)
BUN / CREAT RATIO: 15
CALCIUM: 8.3 mg/dL — ABNORMAL LOW (ref 8.7–10.4)
CHLORIDE: 112 mmol/L — ABNORMAL HIGH (ref 98–107)
CO2: 24 mmol/L (ref 20.0–31.0)
CREATININE: 0.91 mg/dL
EGFR CKD-EPI (2021) MALE: 85 mL/min/{1.73_m2} (ref >=60)
GLUCOSE RANDOM: 92 mg/dL (ref 70–179)
POTASSIUM: 4 mmol/L (ref 3.4–4.8)
SODIUM: 138 mmol/L (ref 135–145)

## 2023-05-18 LAB — PHOSPHORUS: PHOSPHORUS: 3 mg/dL (ref 2.4–5.1)

## 2023-05-18 MED ADMIN — pantoprazole (Protonix) injection 40 mg: 40 mg | INTRAVENOUS | @ 15:00:00 | Stop: 2023-05-18

## 2023-05-18 MED ADMIN — rifAXIMin (XIFAXAN) tablet 550 mg: 550 mg | ORAL | @ 02:00:00 | Stop: 2023-08-23

## 2023-05-18 MED ADMIN — lactated Ringers infusion: 100 mL/h | INTRAVENOUS | @ 10:00:00 | Stop: 2023-05-18

## 2023-05-18 MED ADMIN — enoxaparin (LOVENOX) syringe 40 mg: 40 mg | SUBCUTANEOUS | @ 15:00:00 | Stop: 2023-05-18

## 2023-05-18 MED ADMIN — rifAXIMin (XIFAXAN) tablet 550 mg: 550 mg | ORAL | @ 15:00:00 | Stop: 2023-05-18

## 2023-05-18 MED ADMIN — spironolactone (ALDACTONE) tablet 50 mg: 50 mg | ORAL | @ 15:00:00 | Stop: 2023-05-18

## 2023-05-18 NOTE — Unmapped (Signed)
Tolerating clear liquids. No c/o pain. MIVF Lr @100ml /hr. Pt voiding and emptying ostomy stool independently and hasn't been measuring. NA asked to talk to pt and put in occurrences. VS stable, afebrile.  Problem: Adult Inpatient Plan of Care  Goal: Plan of Care Review  Outcome: Progressing  Flowsheets (Taken 05/18/2023 0508)  Progress: improving  Plan of Care Reviewed With: patient  Goal: Patient-Specific Goal (Individualized)  Outcome: Progressing  Goal: Absence of Hospital-Acquired Illness or Injury  Outcome: Progressing  Intervention: Prevent and Manage VTE (Venous Thromboembolism) Risk  Recent Flowsheet Documentation  Taken 05/17/2023 2215 by Lorette Ang, RN  VTE Prevention/Management:   ambulation promoted   anticoagulant therapy   bleeding precautions maintained   bleeding risk factors identified   dorsiflexion/plantar flexion performed   fluids promoted   intravenous hydration  Goal: Optimal Comfort and Wellbeing  Outcome: Progressing  Goal: Readiness for Transition of Care  Outcome: Progressing  Goal: Rounds/Family Conference  Outcome: Progressing     Goal: Maintenance of Heart Failure Symptom Control  Outcome: Progressing  Goal: Blood Pressure in Desired Range  Outcome: Progressing     Problem: Fall Injury Risk  Goal: Absence of Fall and Fall-Related Injury  Outcome: Progressing

## 2023-05-18 NOTE — Unmapped (Signed)
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 *      OR Procedures:  none     Ancillary Procedures:  NGT    Discharge Day Services: The patient was seen and examined by the  Surgery team on the day of discharge. Vital signs and laboratory values were stable and within normal limits. Discharge plan was discussed, instructions were given, and all questions answered.      Subjective   No acute events overnight. Pain Controlled. No fever or chills. Having BMs.    Objective   Patient Vitals for the past 8 hrs:   BP Temp Temp src Pulse Resp SpO2   05/18/23 0739 122/55 36.9 ??C (98.4 ??F) Oral 67 -- 97 %   05/18/23 0536 120/56 36.6 ??C (97.9 ??F) Oral 64 16 98 %     No intake/output data recorded.    General Appearance:   No acute distress  Lungs:                Clear to auscultation bilaterally  Heart:                           Regular rate and rhythm  Abdomen:                Soft, non-tender, non-distended  Extremities:              Warm and well perfused    Hospital Course:  Thomas Rosales is a 82 y.o. 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 The patient was maintained on bowel rest with NG tube decompression and supportive care with anti-emetics, IV fluid resuscitation, and electrolyte repletion. The patient responded well to conservative therapy, with decreasing nausea and distention. The patient began to pass flatus and have bowel movements; his diet was restarted with clears and gently advanced. At the time of discharge, he is tolerating a regular diet and reports improvement of their previous obstructive symptoms. During the course of this inpatient stay, he has remained clinically stable. The patient is being discharged on 05/18/23. He does not need follow up with Dr. Milbert Coulter unless there are issues or questions    Condition at Discharge: Improved  Discharge Medications:      Medication List      CHANGE how you take these medications     STELARA 45 mg/0.5 mL Soln; Generic drug: ustekinumab; 90mg  subcutaneous   every 8weeks; What changed: how much to take, how  to take this     CONTINUE taking these medications     cholecalciferol (vitamin D3-25 mcg (1,000 unit)) 25 mcg (1,000 unit)   capsule   EPINEPHrine 0.3 mg/0.3 mL injection; Commonly known as: EPIPEN   FREESTYLE LITE METER kit; Generic drug: blood-glucose meter; Use as   instructed   FREESTYLE LITE STRIPS Strp; Generic drug: blood sugar diagnostic; Check   blood sugar as directed once a day and for symptoms of high or low blood   sugar.   lancets 28 gauge Misc; Commonly known as: freestyle; Check blood sugar   as directed once a day and for symptoms of high or low blood sugar.   magnesium oxide 250 mg (150 mg elemental) tablet   multivitamin per tablet; Commonly known as: TAB-A-VITE/THERAGRAN   pantoprazole 40 MG tablet; Commonly known as: Protonix; Take 1 tablet   (40 mg total) by mouth daily.   PRESERVISION AREDS 2 ORAL   rifAXIMin 550 mg Tab; Commonly known as: XIFAXAN; Take 1 tablet (550 mg total) by mouth two (2) times a day.   spironolactone 50 MG tablet; Commonly known as: ALDACTONE; Take 1 tablet   (50 mg total) by mouth daily.   syringe (disposable) 1 mL Syrg; To be used with doxepin oral solution.   SYSTANE OPHT   white petrolatum jelly; Commonly known as: VASELINE       Pending Test Results: None      Other Instructions       Discharge instructions      Discharge instructions       What to expect after discharge from Colorectal Surgery     You were admitted for a bowel obstruction that resolved without surgery.      Your surgeon is: Dr. Neysa Hotter, Dr. Milbert Coulter     To contact your Nurse Coordinator, call: Everlene Balls at [phone]     To contact the [facility] (GI Surgery), call: [phone]        What's next? Your recovery continues at home  Recovery does not end once you leave the hospital. At your next place of recovery, you will continue to make progress - your appetite and energy level will slowly improve. You may have an occasional bad day where you don't feel as good as you did the day before, but then have a few good days to follow. Overall, you should be improving. If you think you are having too many bad days, something may be wrong and we want to hear from you.     Activity and Diet  You should continue walking, climbing stairs, and may ride in a car.   You should not drive if you are taking prescription pain medications (if you were driving prior to coming to the hospital). We will discuss when you can drive again at your follow-up visit.  You can shower after discharge  You can continue to consume a regular diet after discharge, but avoid heavy foods.      Reasons To Call After Discharge  You have a fever, with a temperature more than 101.5 F.  You have new nausea and/or vomiting that you did not have in the hospital.  You have worsening nausea and/or vomiting than what you were experiencing in the hospital.  You are not eating or drinking because of having nausea and/or vomiting.  Your pain is not improving and is getting worse.  You feel dehydrated. Signs of dehydration include feeling light-headed, dizzy, or weak; your urine is  very dark and you are only voiding in small volumes; your mouth/tongue feel dry despite drinking water; you are experiencing a racing heart rate and/or low blood pressure.  If you have surgical incisions that are turning red, have opened up or separated, have new drainage, or are excessively bleeding.  If you have an ostomy and you are having more than 1.5 L (liters) of stool output in 24 hours.  If you were discharged with a drain and the drain has fallen out, starts to leak, stops flushing, or stops working.   You have any new or lingering concerns and you are not sure if you should be worried about them.     Who To Contact After Discharge  For all questions/concerns during business hours (Monday - Friday between 8:00 AM - 4:30 PM), call your Nurse Coordinator Everlene Balls at [phone].   If you are unable to reach your Nurse Coordinator directly during business hours, you may call the [facility] (GI Surgery) at [phone].  For urgent concerns after business hours, call the [employer] [facility] Operator at [phone] and ask for the Surgical Resident On Call.  You will be directed to a surgery resident who is likely not familiar with your specific care, but can help you deal with issues that cannot wait until regular business hours. Please be aware that the person is responding to many in-hospital emergencies and patient issues, and may not answer your phone call immediately.     Follow Up Appointment  You do not need to follow up with Dr. Milbert Coulter, unless you have questions or issues.          Labs and Other Follow-ups after Discharge:  Follow Up instructions and Outpatient Referrals     Discharge instructions          Future Appointments:  Appointments which have been scheduled for you      Jun 05, 2023 12:30 PM  (Arrive by 12:15 PM)  RETURN HEPATOLOGY OTHER with Mickie Hillier, MD  [facility] LIVER TRANSPLANT [address] [facility] [place]) [address]  [address]  [place] [address]  [phone]        Oct 06, 2023 1:30 PM  (Arrive by 1:15 PM)  RETURN IBD with Luanne Bras, MD  [facility] GI MEDICINE [place] [facility]) [address]  [facility] [address]  [place] [address]  [phone]

## 2023-05-20 NOTE — Unmapped (Signed)
Thomas Rosales with [facility] called to inform us the pt no longer wants Home Health.  Pt was discharged from the hospital 05/16/23.

## 2023-05-24 LAB — COMPREHENSIVE METABOLIC PANEL
A/G RATIO: 1
ALBUMIN: 3.5 g/dL — ABNORMAL LOW (ref 3.8–4.8)
ALKALINE PHOSPHATASE: 216 IU/L — ABNORMAL HIGH (ref 44–121)
ALT (SGPT): 41 IU/L (ref 0–44)
AST (SGOT): 49 IU/L — ABNORMAL HIGH (ref 0–40)
BILIRUBIN TOTAL (MG/DL) IN SER/PLAS: 0.7 mg/dL (ref 0.0–1.2)
BLOOD UREA NITROGEN: 24 mg/dL (ref 8–27)
BUN / CREAT RATIO: 22 (ref 10–24)
CALCIUM: 8.9 mg/dL (ref 8.6–10.2)
CHLORIDE: 109 mmol/L — ABNORMAL HIGH (ref 96–106)
CO2: 20 mmol/L (ref 20–29)
CREATININE: 1.08 mg/dL (ref 0.76–1.27)
GLOBULIN, TOTAL: 3.6 g/dL (ref 1.5–4.5)
GLUCOSE: 100 mg/dL — ABNORMAL HIGH (ref 70–99)
POTASSIUM: 4 mmol/L (ref 3.5–5.2)
SODIUM: 141 mmol/L (ref 134–144)
TOTAL PROTEIN: 7.1 g/dL (ref 6.0–8.5)

## 2023-05-24 LAB — PROTIME-INR
INR: 1.1 (ref 0.9–1.2)
PROTHROMBIN TIME: 11.4 s (ref 9.1–12.0)

## 2023-06-05 ENCOUNTER — Ambulatory Visit: Admit: 2023-06-05 | Discharge: 2023-06-06 | Payer: MEDICARE

## 2023-06-05 DIAGNOSIS — K7460 Unspecified cirrhosis of liver: Principal | ICD-10-CM

## 2023-06-05 DIAGNOSIS — C220 Liver cell carcinoma: Principal | ICD-10-CM

## 2023-06-05 DIAGNOSIS — K760 Fatty (change of) liver, not elsewhere classified: Principal | ICD-10-CM

## 2023-06-05 DIAGNOSIS — D649 Anemia, unspecified: Principal | ICD-10-CM

## 2023-06-05 DIAGNOSIS — R188 Other ascites: Principal | ICD-10-CM

## 2023-06-05 LAB — CBC
HEMATOCRIT: 35.6 % — ABNORMAL LOW (ref 39.0–48.0)
HEMOGLOBIN: 11.8 g/dL — ABNORMAL LOW (ref 12.9–16.5)
MEAN CORPUSCULAR HEMOGLOBIN CONC: 33.2 g/dL (ref 32.0–36.0)
MEAN CORPUSCULAR HEMOGLOBIN: 31.4 pg (ref 25.9–32.4)
MEAN CORPUSCULAR VOLUME: 94.5 fL (ref 77.6–95.7)
MEAN PLATELET VOLUME: 10.1 fL (ref 6.8–10.7)
PLATELET COUNT: 167 10*9/L (ref 150–450)
RED BLOOD CELL COUNT: 3.77 10*12/L — ABNORMAL LOW (ref 4.26–5.60)
RED CELL DISTRIBUTION WIDTH: 16.5 % — ABNORMAL HIGH (ref 12.2–15.2)
WBC ADJUSTED: 7.8 10*9/L (ref 3.6–11.2)

## 2023-06-05 LAB — VITAMIN B12: VITAMIN B-12: 443 pg/mL (ref 211–911)

## 2023-06-05 LAB — AFP TUMOR MARKER: AFP-TUMOR MARKER: 4 ng/mL (ref <=8)

## 2023-06-05 LAB — IRON PANEL
IRON SATURATION: 11 % — ABNORMAL LOW (ref 20–55)
IRON: 40 ug/dL — ABNORMAL LOW (ref 65–175)
TOTAL IRON BINDING CAPACITY: 378 ug/dL (ref 250–425)

## 2023-06-05 LAB — FOLATE: FOLATE: 36.2 ng/mL

## 2023-06-05 LAB — FERRITIN: FERRITIN: 10 ng/mL — ABNORMAL LOW (ref 10.5–307.3)

## 2023-06-05 NOTE — Unmapped (Signed)
[facility]  06/05/2023    Reason for visit: Follow up for Cirrhosis of liver with ascites, unspecified hepatic cirrhosis type (CMS-HCC) [K74.60, R18.8]    Assessment/Plan:    82 y.o. 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     Cirrhosis, decompensated: newly-diagnosed 10/2022 by imaging in setting of volume overload; long-standing history of steatotic liver disease. 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  MELD has been relatively preserved.  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  Ascites/volume: developed significant anasarca in 2023 (no significant ascites) in setting of malnutrition, TPN requirements. No prior paracentesis. Now almost completely resolved.  - Educated re: low Na diet and daily weights.   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 Normal platelets without imaging signs of portal hypertension.  Anemia: progressive over past few months, normocytic. No overt bleeding noticed.  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 Cannot tolerate lactulose due to baseline heavy ostomy output.  - Continue rifaximin  HCC: MRI 11/2022 with 1.5cm LR-5 lesion in segment 5. We opted not to treat initially as he was quite ill/malnourished from his Crohn's and requiring TPN; however he has made improvement in the past 6 months in this regard. His MELD is 8, he is Child-Pugh A6, and albumin is 3.5, and nearly fully functional.  - Check AFP  - Repeat MRI Abdomen and review during HB conference    Return in about 4 months (around 10/05/2023).    Subjective   History of Present Illness   Accompanied by: spouse    82 y.o. 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   Last visit: 11/28/22  Doing significantly better since last visit  No longer on TPN, not having volume overload issues  He has had 2 mechanical SBOs in the past 2-3 months, both managed conservatively    Objective   Physical Exam   Vital Signs: BP 148/66  - Pulse 72  - Temp 36.9 ??C (98.5 ??F) (Oral)  - Ht 167.6 cm (5' 6)  - Wt 70.2 kg (154 lb 12.8 oz)  - BMI 24.99 kg/m??   Constitutional: He is in no apparent distress  Eyes: Anicteric sclerae  Cardiovascular: Trace bilateral peripheral edema  Gastrointestinal: Soft, nontender abdomen   Neurologic: Awake, alert, and oriented to person, place, and time with normal speech     Lab Results   Component Value Date    Total Bilirubin 0.7 05/23/2023    AST 49 (H) 05/23/2023    ALT 41 05/23/2023    Alkaline Phosphatase 216 (H) 05/23/2023     CT A/P 05/16/23: multiple dilated loops of small bowel containing fecalized contents and suspected transition point, worrisome for a mechanical SBO; persistent but improving enterocutaneous fistula  MRI Abdomen 01/20/23: cirrhosis, st able segment V LR-5 lesion (1.5cm); mild increase in size of segment VII LR-3 lesion measuring 1.2cm    MELD 3.0: 8 at 05/23/2023  9:18 AM  MELD-Na: 8 at 05/23/2023  9:18 AM  Calculated from:  Serum Creatinine: 1.08 mg/dL at 1/61/0960  4:54 AM  Serum Sodium: 141 mmol/L (Using max of 137 mmol/L) at 05/23/2023  9:18 AM  Total Bilirubin: 0.7 mg/dL (Using min of 1 mg/dL) at 0/98/1191  4:78 AM  Serum Albumin: 3.5 g/dL at 2/95/6213  0:86 AM  INR(ratio): 1.1 at 05/23/2023  9:18 AM  Age at listing (hypothetical): 81 years  Sex: Male at 05/23/2023  9:18 AM

## 2023-06-06 MED ORDER — FERROUS SULFATE 325 MG (65 MG IRON) TABLET
ORAL_TABLET | ORAL | 11 refills | 28 days | Status: CP
Start: 2023-06-06 — End: 2024-06-05

## 2023-06-06 NOTE — Unmapped (Signed)
Summary of today's visit:    1) I will check your blood work to look for causes of anemia    2) You can call [phone] to schedule your MRI    3) Once I get your MRI results, I will review them at our multi-disciplinary conference to discuss treatment options

## 2023-06-10 LAB — COMPREHENSIVE METABOLIC PANEL
ALBUMIN: 3.7 g/dL — ABNORMAL LOW (ref 3.8–4.8)
ALKALINE PHOSPHATASE: 239 IU/L — ABNORMAL HIGH (ref 44–121)
ALT (SGPT): 44 IU/L (ref 0–44)
AST (SGOT): 53 IU/L — ABNORMAL HIGH (ref 0–40)
BILIRUBIN TOTAL (MG/DL) IN SER/PLAS: 0.5 mg/dL (ref 0.0–1.2)
BLOOD UREA NITROGEN: 29 mg/dL — ABNORMAL HIGH (ref 8–27)
BUN / CREAT RATIO: 25 — ABNORMAL HIGH (ref 10–24)
CALCIUM: 9.1 mg/dL (ref 8.6–10.2)
CHLORIDE: 113 mmol/L — ABNORMAL HIGH (ref 96–106)
CO2: 17 mmol/L — ABNORMAL LOW (ref 20–29)
CREATININE: 1.18 mg/dL (ref 0.76–1.27)
GLOBULIN, TOTAL: 3.8 g/dL (ref 1.5–4.5)
GLUCOSE: 114 mg/dL — ABNORMAL HIGH (ref 70–99)
POTASSIUM: 4.3 mmol/L (ref 3.5–5.2)
SODIUM: 142 mmol/L (ref 134–144)
TOTAL PROTEIN: 7.5 g/dL (ref 6.0–8.5)

## 2023-06-10 LAB — PROTIME-INR
INR: 1 (ref 0.9–1.2)
PROTHROMBIN TIME: 10.9 s (ref 9.1–12.0)

## 2023-06-17 NOTE — Unmapped (Signed)
At Dr. Kerry Kass request, called patient to offer iron repletion via infusion versus oral pill. Patient agreeable to receiving iron infusion if able to get close to home. Will send referral for iron dextran to [facility].      IRON THERAPY PLAN ORDER ENTRY DOCUMENTATION    The following order(s) have been placed for insurance authorization:    Therapy Plan Order  Medication: iron dextran (INFED)  Dose & Frequency: 1000 mg IV once  Diagnosis: Iron Deficiency & Crohn's Disease  Treatment Center: [facility]      Relevant Iron Labs:  Lab Results   Component Value Date    HGB 11.8 (L) 06/05/2023    IRON 40 (L) 06/05/2023    FERRITIN 10.0 (L) 06/05/2023    LABIRON 11 (L) 06/05/2023         Requesting Physician: Dr. Noel Gerold      Notes:       Edsel Petrin, PharmD, BCPS, CPP  GI/IBD - Clinical Pharmacist Practitioner

## 2023-06-27 ENCOUNTER — Ambulatory Visit: Admit: 2023-06-27 | Discharge: 2023-06-28 | Payer: MEDICARE

## 2023-06-27 MED ADMIN — gadobenate dimeglumine (MULTIHANCE) 529 mg/mL (0.1mmol/0.2mL) solution 7 mL: 7 mL | INTRAVENOUS | @ 18:00:00 | Stop: 2023-06-27 | NDC 00270526417

## 2023-07-09 NOTE — Unmapped (Signed)
[facility]  07/09/2023     Background   82 y.o. 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    Imaging reviewed: MRI Abdomen 06/27/23    Findings  Previously seen LR-5 lesion is now being classified as LR-M due to central necrosis; however given that it had typical HCC appearance in the past, higher confidence that this is still an HCC  Lesion has enlarged from 1.4cm to 2.7cm    Recommendations   Likely amenable to laparoscopic ablation (or possibly even resection). Not amenable to percutaneous ablation by IR but would be accessible for TACE if needed    Follow-up   Will refer to surgery (Dr. Celine Mans) for consideration of ablation vs. resection

## 2023-07-17 NOTE — Unmapped (Signed)
Left message for patient but spoke with his daughter. She will have him call to schedule with Dr. Celine Mans con eval for ablation or resection of liver lesion. Offered 8/14 appointment

## 2023-07-18 NOTE — Unmapped (Signed)
Patient returned call. Accepted an appointment for 8/14 with Dr. Celine Mans

## 2023-07-23 ENCOUNTER — Ambulatory Visit: Admit: 2023-07-23 | Discharge: 2023-07-24 | Payer: MEDICARE

## 2023-07-23 NOTE — Unmapped (Addendum)
Transplant Surgery History and Physical      Assessment/Recommendations:    Thomas Rosales is a 82 y.o. male seen in consultation at the request of Thomas Hillier, MD for evaluation of possible hepatic mass ablation vs resection.      Today we had a long and thorough discussion with Thomas Rosales and his wife regarding the treatment options for his suspected HCC including surgical vs. non surgical options. We discussed the risks and benefits of open surgical resection vs laparoscopic ablation and the option of TACE. The patient has had extensive abdominal surgeries over the course of his life and has Crohn's disease and colostomy. His wife reports a prior laparoscopic hernia repair was converted to an open procedure given the severity of his adhesive disease. Given such, a laparoscopic ablation would place him at significant increase risk for complications such as bowel injury, recurrent EC fistula, or even the need for further bowel resection. Unfortunately given the patient's medical conditions and current fitness an open resection confers increased risk for peri/postoperative complications. The patient and his wife both voiced understanding of this and asked multiple knowledgeable questions regarding surgical options. At this time the patient is in agreement that risks associated with surgical intervention outweigh the benefits and would like to avoid surgery. That being said we discussed the option of TACE; the patient would like to proceed with consultation for this procedure.   This patient was seen and evaluated with Dr. Celine Rosales who is in agreement with the above.    HPI  Thomas Rosales is a 82 year old male with past medical history of long standing Crohn's disease, HTN, GERD, CKD, chronic EC fistula (now healed), MASLD, cirrhosis (most recent MELD 8) with extensive abdominal surgical history. He is accompanied by his wife who helps elicit some history. Notably they report a long history of intraabdominal surgeries including end colostomy creation ~30 years ago, multiple prior small bowel resections, laparoscopic converted to open ventral/stomal hernia repair. Subsequently he has had multiple episodes of small bowel obstructions most recently in June 2024 that was successfully managed nonoperatively.   He is being seen in consultation for hepatic mass consistent with likely [facility] given appearance on imaging. Patient had incidentally noted hepatic lesions seen on MRI in August 2023. Follow up MRI 11/17/22 noted stable to slightly increased 1.5 cm lesion in segment 5 - LR-5. Also noted on MRI 01/20/23 was 1.2 cm LR-3 lesion in segment 7.  Most recently MRI 06/27/23 showed interval increase of segment 5 lesion to 2.9 cm - LR-M.    In preparation for possible fistula takedown patient states he was recently started on home TPN which he reportedly did not tolerate well. Admits to prior 20 pack year smoking history (quit~40 years ago). He denies any headaches, fevers, chills, shortness of breath or chest pain. States he has started to notice occasional nose bleeds and easy bruising.       Allergies    Patient has no known allergies.      Medications      Current Outpatient Medications   Medication Sig Dispense Refill    blood sugar diagnostic (GLUCOSE BLOOD) Strp Check blood sugar as directed once a day and for symptoms of high or low blood sugar. 100 strip 0    blood-glucose meter kit Use as instructed 1 each 0    cholecalciferol, vitamin D3-25 mcg, 1,000 unit,, 25 mcg (1,000 unit) capsule Take 1 capsule (25 mcg total) by mouth two (2) times a day.  EPINEPHrine (EPIPEN) 0.3 mg/0.3 mL injection Inject 0.3 mL (0.3 mg total) under the skin once.      ferrous sulfate 325 (65 FE) MG tablet Take 1 tablet (325 mg total) by mouth 3 (three) times a week. 12 tablet 11    lancets (FREESTYLE) 28 gauge Misc Check blood sugar as directed once a day and for symptoms of high or low blood sugar. 100 each 11    magnesium oxide 250 mg (150 mg elemental) tablet Take 1 tablet by mouth two (2) times a day.      multivitamin (TAB-A-VITE/THERAGRAN) per tablet Take 1 tablet by mouth daily.      pantoprazole (PROTONIX) 40 MG tablet Take 1 tablet (40 mg total) by mouth daily. 90 tablet 3    propylene glycol/peg 400 (SYSTANE OPHT) Administer 1 drop to both eyes daily as needed (dry/itchy eyes).      rifAXIMin (XIFAXAN) 550 mg Tab Take 1 tablet (550 mg total) by mouth two (2) times a day. 180 tablet 3    spironolactone (ALDACTONE) 50 MG tablet Take 1 tablet (50 mg total) by mouth daily. 30 tablet 5    syringe, disposable, 1 mL Syrg To be used with doxepin oral solution. 25 each 0    ustekinumab (STELARA) 45 mg/0.5 mL Soln 90mg  subcutaneous every 8weeks (Patient taking differently: Inject 90 mg under the skin. 90mg  subcutaneous every 8weeks) 1 mL 5    vit C/E/Zn/coppr/lutein/zeaxan (PRESERVISION AREDS 2 ORAL) Take 1 tablet by mouth two (2) times a day.      white petrolatum (VASELINE) jelly Apply topically daily as needed.       No current facility-administered medications for this visit.         Past Medical History    Past Medical History:   Diagnosis Date    Anal fissure 1983    Apnea, sleep     uses CPAP at night. instructed to bring DOS.     Basal cell carcinoma     Cancer (CMS-HCC)     skin cancer    CHF (congestive heart failure) (CMS-HCC)     Cirrhosis (CMS-HCC) 04/15/2023    Crohn's disease (CMS-HCC)     Diverticulitis of colon     Fatty liver     Gallstones     GERD (gastroesophageal reflux disease)     H/O echocardiogram     Hypertension     Irritable bowel syndrome     Kidney stone     NASH (nonalcoholic steatohepatitis)     Syncope     dizziness    Type 2 diabetes mellitus, without long-term current use of insulin (CMS-HCC) 10/24/2022         Past Surgical History    Past Surgical History:   Procedure Laterality Date    APPENDECTOMY      BOWEL RESECTION  1981, 1984, 1995    3 ft small intestine, <1 ft large intestines, cecum removed; 2nd bowel resection anal fistulas removed; 3rd bowel resection most of colon removed, colostomy placed.    CARDIAC CATHETERIZATION      CARDIOVASCULAR STRESS TEST      CHOLECYSTECTOMY      COLON SURGERY      Same as small bowel surgery    COLOSTOMY  1995    EYE SURGERY  1/20 & 4/21    Cataract & vitrious removal    FRACTURE SURGERY  1961    Broken tibia and fibula    HERNIA REPAIR  2019  Done by Dr Day    LAPAROSCOPIC SMALL BOWEL RESECTION      LEG SURGERY Left     pins in shin    LITHOTRIPSY      PR COLONOSCOPY FLX DX W/COLLJ SPEC WHEN PFRMD  03/19/2018    Procedure: COLONOSCOPY WITH POSSIBLE BIOPSY, POSSIBLE POLYPECTOMY.;  Surgeon: Tressie Ellis, MD;  Location: ENDO OR [facility];  Service: Gastroenterology    PR COLSC FLEXIBLE W/TRANSENDOSCOPIC BALLOON DILAT N/A 09/13/2022    Procedure: COLONOSCOPY, FLEXIBLE; WITH DILATION BY BALLOON, 1 OR MORE STRICTURES;  Surgeon: Luanne Bras, MD;  Location: [facility]ED[facility];  Service: Gastroenterology    PR INCISION & DRAINAGE ABSCESS SIMPLE/SINGLE N/A 08/11/2018    Procedure: INCISION AND DRAINAGE OF ABDOMINAL WALL ABSCESS;  Surgeon: Jerry Caras Day, MD;  Location: OR [place];  Service: General Surgery    PR LAP, INCISIONAL HERNIA REPAIR,REDUCIBLE N/A 06/02/2018    Procedure: LAPAROSCOPIC ASSISTED PARASTOMAL HERNIA REPAIR AND INCISIONAL HERNIA REPAIR WITH MESH;  Surgeon: Jerry Caras Day, MD;  Location: OR [place];  Service: General Surgery    PR VITRECTOMY,MECHANICAL Right 03/28/2020    Procedure: trans pars planar vitrectomy, right eye   ;  Surgeon: Emi Holes, MD;  Location: [facility] OR ;  Service: Ophthalmology    PR XCAPSL CTRC RMVL INSJ IO LENS PROSTH W/O ECP Right 01/13/2019    Procedure: PHACOEMULSIFICATION OF CATARACT WITH INSERTION OF INTRAOCULAR LENS/TORIC LENS/ORA;  Surgeon: Juanna Cao, MD;  Location: OR [facility];  Service: Ophthalmology    PR XCAPSL CTRC RMVL INSJ IO LENS PROSTH W/O ECP Left 01/27/2019    Procedure: PHACOEMULSIFICATION OF CATARACT WITH INSERTION OF INTRAOCULAR LENS/TORIC LENS/ORA;  Surgeon: Juanna Cao, MD;  Location: OR [facility];  Service: Ophthalmology    RESECTION SMALL BOWEL / CLOSURE ILEOSTOMY  1981    temporary ileostomy     REVISION COLOSTOMY  2009    SMALL INTESTINE SURGERY  3x since 1984    Crohn???s disease    URETERAL STENT PLACEMENT  2008         Family History    The patient's family history includes Arthritis in his mother; Cancer in his father; Dementia in his father; Heart disease in his mother..      Social History:    Tobacco use: former smoker  Alcohol use: denies  Drug use: denies      Review of Systems    A 12 system review of systems was negative except as noted in HPI    Objective     PE: Blood pressure 140/62, pulse 73, temperature 36.8 ??C (98.2 ??F), temperature source Tympanic, weight 71.4 kg (157 lb 4.8 oz). Body mass index is 25.39 kg/m??.  General: Pleasant, no acute distress, alert, wife present  Lungs: clear to auscultation bilaterally, no crackles, symmetrical chest wall expansion  Heart: regular rate  Abd: soft, non distended. LLQ ostomy in place. Extensive prior midline scarring/ incision noted  Skin: faint bruise to right forearm  Ext: warm; moves all 4 spontaneously  Neuro: strength intact bilaterally UE         Test Results    Labs:  All lab results last 24 hours:  No results found for this or any previous visit (from the past 24 hour(s)).    Imaging:     MRI Abdomen w wo Contrast 06/27/23    Impression   - LR-M: Increased size of 2.9 cm peripherally enhancing segment 5 lesion.   - No new hepatic lesions.   -  Cirrhosis with portal hypertension.   - Stable 8 mm cystic lesion in the pancreatic tail.   - Stable indeterminate 1.4 cm mesenteric soft tissue nodule in the left anterior abdomen.   - Susceptibility artifact at the site of previously suspected enterocutaneous fistula at the umbilicus. This area is poorly evaluated on MRI. Recommend clinical correlation.

## 2023-07-31 DIAGNOSIS — C220 Liver cell carcinoma: Principal | ICD-10-CM

## 2023-07-31 NOTE — Unmapped (Signed)
Referral for TACE placed with IR. Schedulers notified

## 2023-08-04 DIAGNOSIS — Z933 Colostomy status: Secondary | ICD-10-CM | POA: Insufficient documentation

## 2023-08-05 ENCOUNTER — Ambulatory Visit: Admit: 2023-08-05 | Discharge: 2023-08-06 | Payer: MEDICARE

## 2023-08-05 DIAGNOSIS — J40 Bronchitis, not specified as acute or chronic: Principal | ICD-10-CM

## 2023-08-05 DIAGNOSIS — R0602 Shortness of breath: Principal | ICD-10-CM

## 2023-08-05 MED ORDER — AZITHROMYCIN 250 MG TABLET
ORAL_TABLET | 0 refills | 5 days | Status: CP
Start: 2023-08-05 — End: 2023-08-10

## 2023-08-05 MED ORDER — ALBUTEROL SULFATE HFA 90 MCG/ACTUATION AEROSOL INHALER
Freq: Four times a day (QID) | RESPIRATORY_TRACT | 0 refills | 0 days | Status: CP | PRN
Start: 2023-08-05 — End: 2024-08-04

## 2023-08-05 NOTE — Unmapped (Signed)
Assessment and Plan  1. Shortness of breath    2. Bronchitis        - EKG today normal.   - Start azithromycin as directed and albuterol 2 puffs q6h prn.    - Referral to cardiology. Consider stress tes    Requested Prescriptions     Signed Prescriptions Disp Refills    azithromycin (ZITHROMAX) 250 MG tablet 6 tablet 0     Sig: Take two tablets on day 1, then one tablet daily on days 2-5    albuterol HFA 90 mcg/actuation inhaler 8 g 0     Sig: Inhale 2 puffs every six (6) hours as needed for wheezing.     There are no discontinued medications.    No follow-ups on file.  Signs and symptoms that should prompt return sooner than scheduled were reviewed with the patient.        HPI    Chief Complaint  Chief Complaint   Patient presents with    Shortness of Breath     Thomas Rosales is here for SOB  Accompanied by his wife.     He has had a cough and phlegm production for the last 4 months. The mucus is typically white. He has some SOB and muscle weakness. He feels weak in general. He denies trouble breathing but he gets SOB with exertion. He has never had an inhaler. He has occasional chest twinges.   He is followed by GI/liver clinic for his hepatocellular carcinoma. He is not a good surgical candidate to remove the liver mass, but is considering TACE procedure with an interventional radiologist.     Allergies:  Patient has no known allergies.    Medications:   Outpatient Medications Prior to Visit   Medication Sig Dispense Refill    blood sugar diagnostic (GLUCOSE BLOOD) Strp Check blood sugar as directed once a day and for symptoms of high or low blood sugar. 100 strip 0    blood-glucose meter kit Use as instructed 1 each 0    cholecalciferol, vitamin D3-25 mcg, 1,000 unit,, 25 mcg (1,000 unit) capsule Take 1 capsule (25 mcg total) by mouth two (2) times a day.      EPINEPHrine (EPIPEN) 0.3 mg/0.3 mL injection Inject 0.3 mL (0.3 mg total) under the skin once.      ferrous sulfate 325 (65 FE) MG tablet Take 1 tablet (325 mg total) by mouth 3 (three) times a week. 12 tablet 11    lancets (FREESTYLE) 28 gauge Misc Check blood sugar as directed once a day and for symptoms of high or low blood sugar. 100 each 11    magnesium oxide 250 mg (150 mg elemental) tablet Take 1 tablet by mouth two (2) times a day.      multivitamin (TAB-A-VITE/THERAGRAN) per tablet Take 1 tablet by mouth daily.      pantoprazole (PROTONIX) 40 MG tablet Take 1 tablet (40 mg total) by mouth daily. 90 tablet 3    propylene glycol/peg 400 (SYSTANE OPHT) Administer 1 drop to both eyes daily as needed (dry/itchy eyes).      rifAXIMin (XIFAXAN) 550 mg Tab Take 1 tablet (550 mg total) by mouth two (2) times a day. 180 tablet 3    spironolactone (ALDACTONE) 50 MG tablet Take 1 tablet (50 mg total) by mouth daily. 30 tablet 5    syringe, disposable, 1 mL Syrg To be used with doxepin oral solution. 25 each 0    ustekinumab (STELARA) 45 mg/0.5  mL Soln 90mg  subcutaneous every 8weeks (Patient taking differently: Inject 90 mg under the skin. 90mg  subcutaneous every 8weeks) 1 mL 5    vit C/E/Zn/coppr/lutein/zeaxan (PRESERVISION AREDS 2 ORAL) Take 1 tablet by mouth two (2) times a day.      white petrolatum (VASELINE) jelly Apply topically daily as needed.       No facility-administered medications prior to visit.       Medical History:  Past Medical History:   Diagnosis Date    Anal fissure 1983    Apnea, sleep     uses CPAP at night. instructed to bring DOS.     Basal cell carcinoma     Cancer (CMS-HCC)     skin cancer    CHF (congestive heart failure) (CMS-HCC)     Cirrhosis (CMS-HCC) 04/15/2023    Crohn's disease (CMS-HCC)     Diverticulitis of colon     Fatty liver     Gallstones     GERD (gastroesophageal reflux disease)     H/O echocardiogram     Hypertension     Irritable bowel syndrome     Kidney stone     NASH (nonalcoholic steatohepatitis)     Syncope     dizziness    Type 2 diabetes mellitus, without long-term current use of insulin (CMS-HCC) 10/24/2022 Surgical History:  Past Surgical History:   Procedure Laterality Date    APPENDECTOMY      BOWEL RESECTION  1981, 1984, 1995    3 ft small intestine, <1 ft large intestines, cecum removed; 2nd bowel resection anal fistulas removed; 3rd bowel resection most of colon removed, colostomy placed.    CARDIAC CATHETERIZATION      CARDIOVASCULAR STRESS TEST      CHOLECYSTECTOMY      COLON SURGERY      Same as small bowel surgery    COLOSTOMY  1995    EYE SURGERY  1/20 & 4/21    Cataract & vitrious removal    FRACTURE SURGERY  1961    Broken tibia and fibula    HERNIA REPAIR  2019    Done by Dr Day    LAPAROSCOPIC SMALL BOWEL RESECTION      LEG SURGERY Left     pins in shin    LITHOTRIPSY      PR COLONOSCOPY FLX DX W/COLLJ SPEC WHEN PFRMD  03/19/2018    Procedure: COLONOSCOPY WITH POSSIBLE BIOPSY, POSSIBLE POLYPECTOMY.;  Surgeon: Tressie Ellis, MD;  Location: ENDO OR [facility] [place];  Service: Gastroenterology    PR COLSC FLEXIBLE W/TRANSENDOSCOPIC BALLOON DILAT N/A 09/13/2022    Procedure: COLONOSCOPY, FLEXIBLE; WITH DILATION BY BALLOON, 1 OR MORE STRICTURES;  Surgeon: Luanne Bras, MD;  Location: [facility];  Service: Gastroenterology    PR INCISION & DRAINAGE ABSCESS SIMPLE/SINGLE N/A 08/11/2018    Procedure: INCISION AND DRAINAGE OF ABDOMINAL WALL ABSCESS;  Surgeon: Jerry Caras Day, MD;  Location: OR [place];  Service: General Surgery    PR LAP, INCISIONAL HERNIA REPAIR,REDUCIBLE N/A 06/02/2018    Procedure: LAPAROSCOPIC ASSISTED PARASTOMAL HERNIA REPAIR AND INCISIONAL HERNIA REPAIR WITH MESH;  Surgeon: Jerry Caras Day, MD;  Location: OR [place];  Service: General Surgery    PR VITRECTOMY,MECHANICAL Right 03/28/2020    Procedure: trans pars planar vitrectomy, right eye   ;  Surgeon: Emi Holes, MD;  Location: [facility] OR ;  Service: Ophthalmology    PR XCAPSL CTRC RMVL INSJ IO LENS PROSTH W/O ECP Right 01/13/2019  Procedure: PHACOEMULSIFICATION OF CATARACT WITH INSERTION OF INTRAOCULAR LENS/TORIC LENS/ORA;  Surgeon: Juanna Cao, MD;  Location: OR [facility];  Service: Ophthalmology    PR XCAPSL CTRC RMVL INSJ IO LENS PROSTH W/O ECP Left 01/27/2019    Procedure: PHACOEMULSIFICATION OF CATARACT WITH INSERTION OF INTRAOCULAR LENS/TORIC LENS/ORA;  Surgeon: Juanna Cao, MD;  Location: OR [facility];  Service: Ophthalmology    RESECTION SMALL BOWEL / CLOSURE ILEOSTOMY  1981    temporary ileostomy     REVISION COLOSTOMY  2009    SMALL INTESTINE SURGERY  3x since 1984    Crohn???s disease    URETERAL STENT PLACEMENT  2008       Social History:  Tobacco use:   reports that he quit smoking about 38 years ago. His smoking use included cigarettes. He started smoking about 58 years ago. He has a 20 pack-year smoking history. He has never used smokeless tobacco.  Alcohol use:   reports that he does not currently use alcohol.  Drug use:  reports no history of drug use.      Family History:  Family History   Problem Relation Age of Onset    Heart disease Mother         pacemaker    Arthritis Mother     Dementia Father     Cancer Father            Review of Systems:  ROS:  A comprehensive 10+ review of systems was negative unless otherwise stated in the HPI.         Vitals:    08/05/23 1121   BP: 120/64   BP Site: L Arm   BP Position: Sitting   BP Cuff Size: Small   Pulse: 69   Temp: 36.1 ??C (96.9 ??F)   TempSrc: Temporal   SpO2: 98%   Weight: 71.2 kg (157 lb)       Physical Exam  General: alert, oriented, no distress  HEENT: Anicteric sclerae  RESP: Relaxed respiratory effort.      SKIN: Appropriately warm and dry  NEURO: no focal deficits,   EXT:  No peripheral edema      Medication adherence and barriers to the treatment plan have been addressed. Opportunities to optimize healthy behaviors have been discussed. Patient / caregiver voiced understanding.        I attest that I, Roxy Manns, personally documented this note while acting as scribe for Marlaine Hind, [place].      Roxy Manns, Scribe.  08/05/2023 The documentation recorded by the scribe accurately reflects the service I personally performed and the decisions made by me.    Marlaine Hind, PA

## 2023-08-14 ENCOUNTER — Ambulatory Visit
Admit: 2023-08-14 | Discharge: 2023-08-15 | Payer: MEDICARE | Attending: Student in an Organized Health Care Education/Training Program | Primary: Student in an Organized Health Care Education/Training Program

## 2023-08-14 DIAGNOSIS — C220 Liver cell carcinoma: Principal | ICD-10-CM

## 2023-08-14 LAB — COMPREHENSIVE METABOLIC PANEL
ALBUMIN: 3.3 g/dL — ABNORMAL LOW (ref 3.4–5.0)
ALKALINE PHOSPHATASE: 186 U/L — ABNORMAL HIGH (ref 46–116)
ALT (SGPT): 66 U/L — ABNORMAL HIGH (ref 10–49)
ANION GAP: 6 mmol/L (ref 5–14)
AST (SGOT): 70 U/L — ABNORMAL HIGH (ref <=34)
BILIRUBIN TOTAL: 1.3 mg/dL — ABNORMAL HIGH (ref 0.3–1.2)
BLOOD UREA NITROGEN: 29 mg/dL — ABNORMAL HIGH (ref 9–23)
BUN / CREAT RATIO: 23
CALCIUM: 9.4 mg/dL (ref 8.7–10.4)
CHLORIDE: 113 mmol/L — ABNORMAL HIGH (ref 98–107)
CO2: 22 mmol/L (ref 20.0–31.0)
CREATININE: 1.25 mg/dL — ABNORMAL HIGH
EGFR CKD-EPI (2021) MALE: 58 mL/min/{1.73_m2} — ABNORMAL LOW (ref >=60)
GLUCOSE RANDOM: 101 mg/dL (ref 70–179)
POTASSIUM: 4.6 mmol/L (ref 3.5–5.1)
PROTEIN TOTAL: 7.7 g/dL (ref 5.7–8.2)
SODIUM: 141 mmol/L (ref 135–145)

## 2023-08-14 LAB — CBC
HEMATOCRIT: 40.1 % (ref 39.0–48.0)
HEMOGLOBIN: 14.2 g/dL (ref 12.9–16.5)
MEAN CORPUSCULAR HEMOGLOBIN CONC: 35.5 g/dL (ref 32.0–36.0)
MEAN CORPUSCULAR HEMOGLOBIN: 33.3 pg — ABNORMAL HIGH (ref 25.9–32.4)
MEAN CORPUSCULAR VOLUME: 94 fL (ref 77.6–95.7)
MEAN PLATELET VOLUME: 9.6 fL (ref 6.8–10.7)
PLATELET COUNT: 154 10*9/L (ref 150–450)
RED BLOOD CELL COUNT: 4.27 10*12/L (ref 4.26–5.60)
RED CELL DISTRIBUTION WIDTH: 16.9 % — ABNORMAL HIGH (ref 12.2–15.2)
WBC ADJUSTED: 7.9 10*9/L (ref 3.6–11.2)

## 2023-08-14 LAB — PROTIME-INR
INR: 1.08
PROTIME: 12.1 s (ref 9.9–12.6)

## 2023-08-14 LAB — AFP TUMOR MARKER: AFP-TUMOR MARKER: 9 ng/mL — ABNORMAL HIGH (ref <=8)

## 2023-08-14 NOTE — Unmapped (Signed)
Patient Name: Thomas Rosales  Patient Age: 82 y.o.  Encounter Date: 08/14/23   Attending Interventional Radiologist: Dr. Veronia Beets    Referring Physician: Florene Glen, MD  [address]  [address]   [place],  [place] [address]  Primary Care Provider: Cory Roughen, PA      SUBJECTIVE      Reason for Visit: Seg 5 LR-M Management      History of Present Illness: Thomas Rosales is a 82 y.o. male who is seen in consultation at the request of Chirag Learta Codding* for evaluation of LR-M Seg 5 Lesion.      Briefly, 2 M w/ long standing Crohn's, GERD, CKD (Cr baseline around 1), MASLD cirrhosis, and extensive abdominal surgical history. Pt was seen by our surgical colleagues for laparascopic ablation vs resection - and found Thomas Rosales to not be a great candidate for these options given his surgical history. Given this, he is referred to VIR for locoregional therapy evaluation.     Currently, the patient does not have any complaints. He has a colostomy, without issues. Able to ambulate. No hepatic encephalopathy Sx. Wife present in room. All questions answered.     Number and Complexity of Problems Addressed: 1 or more chronic illnesses with exacerbation, progression, or side effects of treatment      Past Medical History:  Past Medical History:   Diagnosis Date    Anal fissure 1983    Apnea, sleep     uses CPAP at night. instructed to bring DOS.     Basal cell carcinoma     Cancer (CMS-HCC)     skin cancer    CHF (congestive heart failure) (CMS-HCC)     Cirrhosis (CMS-HCC) 04/15/2023    Crohn's disease (CMS-HCC)     Diverticulitis of colon     Fatty liver     Gallstones     GERD (gastroesophageal reflux disease)     H/O echocardiogram     Hypertension     Irritable bowel syndrome     Kidney stone     NASH (nonalcoholic steatohepatitis)     Syncope     dizziness    Type 2 diabetes mellitus, without long-term current use of insulin (CMS-HCC) 10/24/2022       Past Surgical History:  Past Surgical History:   Procedure Laterality Date    APPENDECTOMY      BOWEL RESECTION  1981, 1984, 1995    3 ft small intestine, <1 ft large intestines, cecum removed; 2nd bowel resection anal fistulas removed; 3rd bowel resection most of colon removed, colostomy placed.    CARDIAC CATHETERIZATION      CARDIOVASCULAR STRESS TEST      CHOLECYSTECTOMY      COLON SURGERY      Same as small bowel surgery    COLOSTOMY  1995    EYE SURGERY  1/20 & 4/21    Cataract & vitrious removal    FRACTURE SURGERY  1961    Broken tibia and fibula    HERNIA REPAIR  2019    Done by Dr Day    LAPAROSCOPIC SMALL BOWEL RESECTION      LEG SURGERY Left     pins in shin    LITHOTRIPSY      PR COLONOSCOPY FLX DX W/COLLJ [facility] WHEN PFRMD  03/19/2018    Procedure: COLONOSCOPY WITH POSSIBLE BIOPSY, POSSIBLE POLYPECTOMY.;  Surgeon: Tressie Ellis, MD;  Location: ENDO  OR [facility];  Service: Gastroenterology    PR COLSC FLEXIBLE W/TRANSENDOSCOPIC BALLOON DILAT N/A 09/13/2022    Procedure: COLONOSCOPY, FLEXIBLE; WITH DILATION BY BALLOON, 1 OR MORE STRICTURES;  Surgeon: Luanne Bras, MD;  Location: [facility] GI PROCEDURES [facility];  Service: Gastroenterology    PR INCISION & DRAINAGE ABSCESS SIMPLE/SINGLE N/A 08/11/2018    Procedure: INCISION AND DRAINAGE OF ABDOMINAL WALL ABSCESS;  Surgeon: Jerry Caras Day, MD;  Location: OR [place];  Service: General Surgery    PR LAP, INCISIONAL HERNIA REPAIR,REDUCIBLE N/A 06/02/2018    Procedure: LAPAROSCOPIC ASSISTED PARASTOMAL HERNIA REPAIR AND INCISIONAL HERNIA REPAIR WITH MESH;  Surgeon: Jerry Caras Day, MD;  Location: OR [place];  Service: General Surgery    PR VITRECTOMY,MECHANICAL Right 03/28/2020    Procedure: trans pars planar vitrectomy, right eye   ;  Surgeon: Emi Holes, MD;  Location: [facility] OR [place];  Service: Ophthalmology    PR XCAPSL CTRC RMVL INSJ IO LENS PROSTH W/O ECP Right 01/13/2019    Procedure: PHACOEMULSIFICATION OF CATARACT WITH INSERTION OF INTRAOCULAR LENS/TORIC LENS/ORA;  Surgeon: Juanna Cao, MD;  Location: OR [facility];  Service: Ophthalmology    PR XCAPSL CTRC RMVL INSJ IO LENS PROSTH W/O ECP Left 01/27/2019    Procedure: PHACOEMULSIFICATION OF CATARACT WITH INSERTION OF INTRAOCULAR LENS/TORIC LENS/ORA;  Surgeon: Juanna Cao, MD;  Location: OR [facility];  Service: Ophthalmology    RESECTION SMALL BOWEL / CLOSURE ILEOSTOMY  1981    temporary ileostomy     REVISION COLOSTOMY  2009    SMALL INTESTINE SURGERY  3x since 1984    Crohn???s disease    URETERAL STENT PLACEMENT  2008       Family History:  Family History   Problem Relation Age of Onset    Heart disease Mother         pacemaker    Arthritis Mother     Dementia Father     Cancer Father         Allergies:  No Known Allergies     Anticoagulant/Antiplatelet Medications: No anticoagulant medications    OBJECTIVE     Physical Exam:  Vitals:    08/14/23 1336   BP: 133/62   Pulse: 68   Resp: 14   Temp: 36.8 ??C (98.2 ??F)   SpO2: 98%     Body mass index is 25.34 kg/m??.    General: alert, in no acute distress  Respiratory: breathing comfortably on room air  Cardiovascular: regular rate and rhythm    Point-of-care US performed: Not performed    Pertinent Laboratory Values:  Lab Results   Component Value Date    WBC 7.9 08/14/2023    HGB 14.2 08/14/2023    HCT 40.1 08/14/2023    PLT 154 08/14/2023     Lab Results   Component Value Date    NA 141 08/14/2023    K 4.6 08/14/2023    PHOS 3.0 05/18/2023    MG 1.7 05/18/2023     Lab Results   Component Value Date    BUN 29 (H) 08/14/2023     Lab Results   Component Value Date    ALBUMIN 3.3 (L) 08/14/2023     Lab Results   Component Value Date    BILIDIR 0.70 (H) 04/20/2023     Lab Results   Component Value Date    AST 70 (H) 08/14/2023    ALT 66 (H) 08/14/2023    ALKPHOS 186 (H) 08/14/2023     Lab Results  Component Value Date    APTT 27.7 09/22/2020     No results found for: AFP    No results found for: CA199    08/14/2023, labs were reviewed by me personally. Interpretation: No recent labs available to interpret. However, labs from July demonstrate a good liver function and good coags. Suggesting TARE is safe. We will attain labs today.     Pertinent Imaging Studies: 08/14/2023, imaging was visualized and reviewed by me personally. Interpretation: MRI 06/27/2023 demonstrates a 2.5 cm LR-5 lesion in segment 5. Bowel is near by, but not abutting the liver at this level. Therefore, Y90-RS remains a reasonable alternative for treatment over TACE. We can plan on shunt fraction mapping and evaluating lesion and bowel relationship closer then.         Amount and/or Complexity of Data: High : (Must meet at all three listed below)  Review of the result(s) of each unique test ( at least 3 labs CBC, CMP, INR)  Review of prior external note(s) from each unique source  Independent interpretation of a test performed by another physician/other qualified health care professional (not separately reported)    ASA Score: ASA 2 - Patient with mild systemic disease with no functional limitations    Performance Status: 1 = Restricted in physically strenuous activity but ambulatory and able to carry out work of a light or sedentary nature, e.g., light house work, office work    ASSESSMENT/PLAN     Tyrane Askar is a 82 y.o. 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 Pt is not a great surgical candidate 2/2 extensive surgery history and Crohn's disease.     We discussed multiple therapies for this lesion, including percutaneous ablation, DebTACE, TARE, surgery, and EBRT. We specifically discussed impact of bowel being within the region of interest, but not abutting the actual lesion nor immediately adjacent liver. We discussed how this would impact radiation therapy, both via TARE or EBRT should the bowel be very intimately close in the future. We discussed plan - which will be schedule MAA shunt study first and then Y90-RS 2 weeks later (that way if bowel is found to be very close, we can switch to TACE on day of Y90 procedure). Patient agreed with plan.     At this time, we feel that he would benefit most from TARE The risks, benefits and alternatives were fully discussed including bleeding, infection, non-target embolization , post-operative pain , and damage to adjacent structures/organs. The patient's questions were all answered to his satisfaction.     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)  ECOG Performance Score: 1 - Symptomatic but completely ambulatory   Child Pugh Class: A6  ALBI: Grade 2  AFP: (08/14/23) 9  Albumin:  Lab Results   Component Value Date    PROT 7.7 08/14/2023    ALBUMIN 3.3 (L) 08/14/2023     Total Bilirubin:   Lab Results   Component Value Date    BILITOT 1.3 (H) 08/14/2023     MELD 3.0: 11 at 08/14/2023  2:36 PM  MELD-Na: 11 at 08/14/2023  2:36 PM  Calculated from:  Serum Creatinine: 1.25 mg/dL at 6/0/4540  9:81 PM  Serum Sodium: 141 mmol/L (Using max of 137 mmol/L) at 08/14/2023  2:36 PM  Total Bilirubin: 1.3 mg/dL at 12/17/1476  2:95 PM  Serum Albumin:  3.3 g/dL at 12/14/1094  0:45 PM  INR(ratio): 1.08 at 08/14/2023  2:36 PM  Age at listing (hypothetical): 81 years  Sex: Male at 08/14/2023  2:36 PM      Plan:   -- Procedure will be scheduled at the earliest mutually available date & time  -- Anticoagulant medication hold required: yes  -- Anticoagulation can be resumed: 24 hours following procedure  -- Antibiotics required: Yes- surgical prophylaxis    -- Discharge medications:Yes- TARE/TACE-Levaquin 500 mg daily x 7 days (antibiotic to prevent post procedure infection), Zofran 4 mg Q8H prn x5 days for nausea, and Oxycodone 5 mg Q6H x 5 days for pain.  --Planned level of sedation: Moderate sedation  --Planned access: left wrist (arterial) and groin (arterial), Positioning: supine  --Labs needed: Yes- per divisional guidelines, the patient needs new CBC and INR, 30 days before the procedure   --Pre-procedural medications required: Yes- TARE/TACE- Zofran 8 mg IV for nausea, Decadron 8 mg IV for anti-inflammation, Unasyn 3g IV (within one hour of puncture) antibiotics for surgical prophylaxis.   --Pre-US needed: Yes- to assess left radial artery diameter (must be greater or equal to 2 mm for radial access).    -- Spoke with No one.                                              -- Notes reviewed:Yes- progress note of Dr. Celine Mans stating, patient is not a good surgical candidate given his history.     --Follow up- Follow up in 6-8 weeks s/p Y90-RS with MRI with Dr. Ciro Backer in VIR clinic.     Informed consent obtained:   Yes---This procedure has been fully reviewed with the patient/patient's authorized representative. The risks, benefits and alternatives have been explained, and the patient/patient's authorized representative has consented to the procedure.   --The patient will accept blood products in an emergent situation.   --The patient does not have a Do Not Resuscitate order in effect.    Patient seen and discussed with Dr. Veronia Beets, who agrees with the above assessment and plan. Thank you for involving [place] in the care of this patient.    Rosalio Loud, MD  08/14/23 6:59 PM      Rosalio Loud, MD  Assistant Professor of Radiology  Division of Vascular and Interventional Radiology  [facility]

## 2023-08-14 NOTE — Unmapped (Signed)
I spoke with Thomas Rosales regarding the date, time, location and instructions for the liver mapping and Y90 radioembolization with Dr. Ciro Backer.   Blood Thinners: None  Questions answered, contact information provided.

## 2023-08-20 NOTE — Unmapped (Signed)
Complex Case Management  SUMMARY NOTE    High Risk Care Coordinator  spoke with patient and verified correct patient using two identifiers today to introduce the Complex Case Management program.     Discussed the following:  Program Services, Expectations of participation, and Verified Demographics    Program status: Declined  Patient states that they do not need the services of the Complex Case Management program at this time. Care Manager Assistant has voiced understanding and will send our contact information to patient via their [place] My Chart shall they need to utilize our services in the future as patient has voiced understanding.       Deliah Goody - Care Management Assistant  [facility]  [address], [address]  [place], [place] [address]  P: [phone] F: [phone]  [email or web address]

## 2023-08-21 NOTE — Unmapped (Signed)
Specialty Medication(s): Xifaxan 550mg      Thomas Rosales has been dis-enrolled from the [facility] specialty pharmacy services due to a pharmacy change. The patient is now filling at the following pharmacy:     [facility] - [place], [place] - [address]  [address], [place] [address]  Phone: [phone]  Fax: [phone]       Additional information provided to the patient: n/a    Roderic Palau, PharmD  [facility] Specialty Pharmacist

## 2023-08-28 ENCOUNTER — Encounter: Payer: Medicare Other | Admitting: Dermatology

## 2023-08-29 NOTE — Unmapped (Signed)
VIR pre procedure prep call completed. Reviewed to register at 0740 am on ground floor [facility] then [facility].  Informed of no show/late cancellation policy. NPO guidelines reviewed. Pt OK to take sips of clear liquids with all AM meds.  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 Made aware of visitation policy. Pt verbalized understanding. All questions answered.

## 2023-09-01 ENCOUNTER — Encounter: Payer: Self-pay | Admitting: Dermatology

## 2023-09-01 ENCOUNTER — Ambulatory Visit: Payer: Medicare Other | Admitting: Dermatology

## 2023-09-01 DIAGNOSIS — Z1283 Encounter for screening for malignant neoplasm of skin: Secondary | ICD-10-CM | POA: Diagnosis not present

## 2023-09-01 DIAGNOSIS — L578 Other skin changes due to chronic exposure to nonionizing radiation: Secondary | ICD-10-CM | POA: Diagnosis not present

## 2023-09-01 DIAGNOSIS — L570 Actinic keratosis: Secondary | ICD-10-CM | POA: Diagnosis not present

## 2023-09-01 DIAGNOSIS — L821 Other seborrheic keratosis: Secondary | ICD-10-CM

## 2023-09-01 DIAGNOSIS — L859 Epidermal thickening, unspecified: Secondary | ICD-10-CM | POA: Diagnosis not present

## 2023-09-01 DIAGNOSIS — D492 Neoplasm of unspecified behavior of bone, soft tissue, and skin: Secondary | ICD-10-CM

## 2023-09-01 DIAGNOSIS — C44622 Squamous cell carcinoma of skin of right upper limb, including shoulder: Secondary | ICD-10-CM

## 2023-09-01 DIAGNOSIS — L820 Inflamed seborrheic keratosis: Secondary | ICD-10-CM | POA: Diagnosis not present

## 2023-09-01 DIAGNOSIS — L814 Other melanin hyperpigmentation: Secondary | ICD-10-CM

## 2023-09-01 DIAGNOSIS — D485 Neoplasm of uncertain behavior of skin: Secondary | ICD-10-CM

## 2023-09-01 DIAGNOSIS — D1801 Hemangioma of skin and subcutaneous tissue: Secondary | ICD-10-CM

## 2023-09-01 DIAGNOSIS — Z85828 Personal history of other malignant neoplasm of skin: Secondary | ICD-10-CM

## 2023-09-01 DIAGNOSIS — L729 Follicular cyst of the skin and subcutaneous tissue, unspecified: Secondary | ICD-10-CM

## 2023-09-01 DIAGNOSIS — L720 Epidermal cyst: Secondary | ICD-10-CM

## 2023-09-01 DIAGNOSIS — D229 Melanocytic nevi, unspecified: Secondary | ICD-10-CM

## 2023-09-01 DIAGNOSIS — W908XXA Exposure to other nonionizing radiation, initial encounter: Secondary | ICD-10-CM | POA: Diagnosis not present

## 2023-09-01 NOTE — Patient Instructions (Addendum)
Wound Care Instructions  Cleanse wound gently with soap and water once a day then pat dry with clean gauze. Apply a thin coat of Petrolatum (petroleum jelly, "Vaseline") over the wound (unless you have an allergy to this). We recommend that you use a new, sterile tube of Vaseline. Do not pick or remove scabs. Do not remove the yellow or white "healing tissue" from the base of the wound.  Cover the wound with fresh, clean, nonstick gauze and secure with paper tape. You may use Band-Aids in place of gauze and tape if the wound is small enough, but would recommend trimming much of the tape off as there is often too much. Sometimes Band-Aids can irritate the skin.  You should call the office for your biopsy report after 1 week if you have not already been contacted.  If you experience any problems, such as abnormal amounts of bleeding, swelling, significant bruising, significant pain, or evidence of infection, please call the office immediately.  FOR ADULT SURGERY PATIENTS: If you need something for pain relief you may take 1 extra strength Tylenol (acetaminophen) AND 2 Ibuprofen (200mg  each) together every 4 hours as needed for pain. (do not take these if you are allergic to them or if you have a reason you should not take them.) Typically, you may only need pain medication for 1 to 3 days.         Due to recent changes in healthcare laws, you may see results of your pathology and/or laboratory studies on MyChart before the doctors have had a chance to review them. We understand that in some cases there may be results that are confusing or concerning to you. Please understand that not all results are received at the same time and often the doctors may need to interpret multiple results in order to provide you with the best plan of care or course of treatment. Therefore, we ask that you please give [place] 2 business days to thoroughly review all your results before contacting the office for clarification.  Should we see a critical lab result, you will be contacted sooner.   If You Need Anything After Your Visit  If you have any questions or concerns for your doctor, please call our main line at [phone] and press option 4 to reach your doctor's medical assistant. If no one answers, please leave a voicemail as directed and we will return your call as soon as possible. Messages left after 4 pm will be answered the following business day.   You may also send [place] a message via MyChart. We typically respond to MyChart messages within 1-2 business days.  For prescription refills, please ask your pharmacy to contact our office. Our fax number is [phone].  If you have an urgent issue when the clinic is closed that cannot wait until the next business day, you can page your doctor at the number below.    Please note that while we do our best to be available for urgent issues outside of office hours, we are not available 24/7.   If you have an urgent issue and are unable to reach [place], you may choose to seek medical care at your doctor's office, retail clinic, urgent care center, or emergency room.  If you have a medical emergency, please immediately call 911 or go to the emergency department.  Pager Numbers  - Dr. Gwen Pounds: [phone]  - Dr. Roseanne Reno: [phone]  - Dr. Katrinka Blazing: [phone]   In the event of inclement weather, please  call our main line at [phone] for an update on the status of any delays or closures.  Dermatology Medication Tips: Please keep the boxes that topical medications come in in order to help keep track of the instructions about where and how to use these. Pharmacies typically print the medication instructions only on the boxes and not directly on the medication tubes.   If your medication is too expensive, please contact our office at [phone] option 4 or send [place] a message through MyChart.   We are unable to tell what your co-pay for medications will be  in advance as this is different depending on your insurance coverage. However, we may be able to find a substitute medication at lower cost or fill out paperwork to get insurance to cover a needed medication.   If a prior authorization is required to get your medication covered by your insurance company, please allow [place] 1-2 business days to complete this process.  Drug prices often vary depending on where the prescription is filled and some pharmacies may offer cheaper prices.  The website www.goodrx.com contains coupons for medications through different pharmacies. The prices here do not account for what the cost may be with help from insurance (it may be cheaper with your insurance), but the website can give you the price if you did not use any insurance.  - You can print the associated coupon and take it with your prescription to the pharmacy.  - You may also stop by our office during regular business hours and pick up a GoodRx coupon card.  - If you need your prescription sent electronically to a different pharmacy, notify our office through [facility] or by phone at [phone] option 4.     Si Usted Necesita Algo Despus de Su Visita  Tambin puede enviarnos un mensaje a travs de Clinical cytogeneticist. Por lo general respondemos a los mensajes de MyChart en el transcurso de 1 a 2 das hbiles.  Para renovar recetas, por favor pida a su farmacia que se ponga en contacto con nuestra oficina. Annie Sable de fax es [place] [phone].  Si tiene un asunto urgente cuando la clnica est cerrada y que no puede esperar hasta el siguiente da hbil, puede llamar/localizar a su doctor(a) al nmero que aparece a continuacin.   Por favor, tenga en cuenta que aunque hacemos todo lo posible para estar disponibles para asuntos urgentes fuera del horario de [place], no estamos disponibles las 24 horas del da, los 7 [address]  [address] de la [place].   Si tiene un problema urgente y no puede comunicarse con nosotros,  puede optar por buscar atencin mdica  en el consultorio de su doctor(a), en una clnica privada, en un centro de atencin urgente o en una sala de emergencias.  Si tiene Engineer, drilling, por favor llame inmediatamente al 911 o vaya a la sala de emergencias.  Nmeros de bper  - Dr. Gwen Pounds: [phone]  - Dra. Roseanne Reno: [phone]  - Dr. Katrinka Blazing: [phone]   En caso de inclemencias del tiempo, por favor llame a Lacy Duverney principal al [phone] para una actualizacin sobre el [place] de cualquier retraso o cierre.  Consejos para la medicacin en dermatologa: Por favor, guarde las cajas en las que vienen los medicamentos de uso tpico para ayudarle a seguir las instrucciones sobre dnde y cmo usarlos. Las farmacias generalmente imprimen las instrucciones del medicamento slo en las cajas y no directamente en los tubos del [place].   Si su medicamento es [employer],  por favor, pngase en contacto con nuestra oficina llamando al [phone] y presione la opcin 4 o envenos un mensaje a travs de Clinical cytogeneticist.   No podemos decirle cul ser su copago por los medicamentos por adelantado ya que esto es diferente dependiendo de la cobertura de su seguro. Sin embargo, es posible que podamos encontrar un medicamento sustituto a Audiological scientist un formulario para que el seguro cubra el medicamento que se considera necesario.   Si se requiere una autorizacin previa para que su compaa de seguros [place] su medicamento, por favor permtanos de 1 a 2 das hbiles para completar [address]  Los precios de los medicamentos varan con frecuencia dependiendo del Environmental consultant de dnde se surte la receta y alguna farmacias pueden ofrecer precios ms baratos.  El sitio web www.goodrx.com tiene cupones para medicamentos de Health and safety inspector. Los precios aqu no tienen en cuenta lo que podra costar con la ayuda del seguro (puede ser ms barato con su seguro), pero el sitio web puede darle  el precio si no utiliz Tourist information centre manager.  - Puede imprimir el cupn correspondiente y llevarlo con su receta a la farmacia.  - Tambin puede pasar por nuestra oficina durante el horario de atencin regular y Education officer, museum una tarjeta de cupones de GoodRx.  - Si necesita que su receta se enve electrnicamente a una farmacia diferente, informe a nuestra oficina a travs de MyChart de [place] o por telfono llamando al [phone] y presione la opcin 4.

## 2023-09-01 NOTE — Progress Notes (Signed)
Follow-Up Visit   Subjective  Dean Ryan is a 82 y.o. male who presents for the following: Skin Cancer Screening and Full Body Skin Exam  The patient presents for Total-Body Skin Exam (TBSE) for skin cancer screening and mole check. The patient has spots, moles and lesions to be evaluated, some may be new or changing and the patient may have concern these could be cancer.  The following portions of the chart were reviewed this encounter and updated as appropriate: medications, allergies, medical history  Review of Systems:  No other skin or systemic complaints except as noted in HPI or Assessment and Plan.  Objective  Well appearing patient in no apparent distress; mood and affect are within normal limits.  A full examination was performed including scalp, head, eyes, ears, nose, lips, neck, chest, axillae, abdomen, back, buttocks, bilateral upper extremities, bilateral lower extremities, hands, feet, fingers, toes, fingernails, and toenails. All findings within normal limits unless otherwise noted below.   Relevant physical exam findings are noted in the Assessment and Plan.    Assessment & Plan        SKIN CANCER SCREENING PERFORMED TODAY.  ACTINIC DAMAGE - Chronic condition, secondary to cumulative UV/sun exposure - diffuse scaly erythematous macules with underlying dyspigmentation - Recommend daily broad spectrum sunscreen SPF 30+ to sun-exposed areas, reapply every 2 hours as needed.  - Staying in the shade or wearing long sleeves, sun glasses (UVA+UVB protection) and wide brim hats (4-inch brim around the entire circumference of the hat) are also recommended for sun protection.  - Call for new or changing lesions.  LENTIGINES, SEBORRHEIC KERATOSES, HEMANGIOMAS - Benign normal skin lesions - Benign-appearing - Call for any changes  MELANOCYTIC NEVI - Tan-brown and/or pink-flesh-colored symmetric macules and papules - Benign appearing on exam today -  Observation - Call clinic for new or changing moles - Recommend daily use of broad spectrum spf 30+ sunscreen to sun-exposed areas.  HISTORY OF SQUAMOUS CELL CARCINOMA OF THE SKIN - No evidence of recurrence today - No lymphadenopathy - Recommend regular full body skin exams - Recommend daily broad spectrum sunscreen SPF 30+ to sun-exposed areas, reapply every 2 hours as needed.  - Call if any new or changing lesions are noted between office visits  Inflamed cyst with hx of ruptured and drainage Exam: SQ nodule with open punctum and over violaceous hemorrhage plaque at the R flank.   Benign-appearing. Exam most consistent with an epidermal inclusion cyst. Discussed that a cyst is a benign growth that can grow over time and sometimes get irritated or inflamed. Recommend observation if it is not bothersome. Discussed option of surgical excision to remove it if it is growing, symptomatic, or other changes noted. Please call for new or changing lesions so they can be evaluated.    AK (actinic keratosis) (20) R temple x 2, R preauricular x 1, R mid cheek x 1, R sup ear helix x 1, R med cheek x 1, R jaw line x 3, sup to L eyebrow x 1, L jaw line x 3, L neck x 2, L sup and mid inf helix x 2, vertex scalp x 1, nasal tip x 1, R med cheek x 1  Destruction of lesion - R temple x 2, R preauricular x 1, R mid cheek x 1, R sup ear helix x 1, R med cheek x 1, R jaw line x 3, sup to L eyebrow x 1, L jaw line x 3, L neck x 2,  L sup and mid inf helix x 2, vertex scalp x 1, nasal tip x 1, R med cheek x 1 (20) Complexity: simple   Destruction method: cryotherapy   Informed consent: discussed and consent obtained   Timeout:  patient name, date of birth, surgical site, and procedure verified Lesion destroyed using liquid nitrogen: Yes   Region frozen until ice ball extended beyond lesion: Yes   Outcome: patient tolerated procedure well with no complications   Post-procedure details: wound care instructions  given    Inflamed seborrheic keratosis vertex scalp x 1  Symptomatic, irritating, patient would like treated.   Destruction of lesion - vertex scalp x 1 Complexity: simple   Destruction method: cryotherapy   Informed consent: discussed and consent obtained   Timeout:  patient name, date of birth, surgical site, and procedure verified Lesion destroyed using liquid nitrogen: Yes   Region frozen until ice ball extended beyond lesion: Yes   Outcome: patient tolerated procedure well with no complications   Post-procedure details: wound care instructions given    Neoplasm of uncertain behavior of skin (2) R forearm  Skin / nail biopsy Type of biopsy: tangential   Informed consent: discussed and consent obtained   Timeout: patient name, date of birth, surgical site, and procedure verified   Procedure prep:  Patient was prepped and draped in usual sterile fashion Prep type:  Isopropyl alcohol Anesthesia: the lesion was anesthetized in a standard fashion   Anesthetic:  1% lidocaine w/ epinephrine 1-100,000 buffered w/ 8.4% NaHCO3 Instrument used: DermaBlade   Hemostasis achieved with: pressure and aluminum chloride   Outcome: patient tolerated procedure well   Post-procedure details: sterile dressing applied and wound care instructions given   Dressing type: bandage and petrolatum    Specimen 1 - Surgical pathology Differential Diagnosis: D48.5 r/o SCC Check Margins: No  R hand dorsal  Skin / nail biopsy Type of biopsy: tangential   Informed consent: discussed and consent obtained   Timeout: patient name, date of birth, surgical site, and procedure verified   Procedure prep:  Patient was prepped and draped in usual sterile fashion Prep type:  Isopropyl alcohol Anesthesia: the lesion was anesthetized in a standard fashion   Anesthetic:  1% lidocaine w/ epinephrine 1-100,000 buffered w/ 8.4% NaHCO3 Instrument used: DermaBlade   Hemostasis achieved with: pressure and aluminum  chloride   Outcome: patient tolerated procedure well   Post-procedure details: sterile dressing applied and wound care instructions given   Dressing type: bandage and petrolatum    Specimen 2 - Surgical pathology Differential Diagnosis: D48.5 r/o SCC Check Margins: No   Return in about 6 months (around 02/29/2024) for TBSE.  Maylene Roes, CMA, am acting as scribe for Elie Goody, MD .   Documentation: I have reviewed the above documentation for accuracy and completeness, and I agree with the above.  Elie Goody, MD

## 2023-09-03 ENCOUNTER — Ambulatory Visit: Admit: 2023-09-03 | Discharge: 2023-09-05 | Payer: MEDICARE

## 2023-09-03 ENCOUNTER — Ambulatory Visit: Admit: 2023-09-03 | Payer: MEDICARE

## 2023-09-03 ENCOUNTER — Ambulatory Visit: Admit: 2023-09-03 | Discharge: 2023-09-03 | Payer: MEDICARE

## 2023-09-03 DIAGNOSIS — C220 Liver cell carcinoma: Principal | ICD-10-CM

## 2023-09-03 LAB — COMPREHENSIVE METABOLIC PANEL
ALBUMIN: 3.3 g/dL — ABNORMAL LOW (ref 3.4–5.0)
ALKALINE PHOSPHATASE: 220 U/L — ABNORMAL HIGH (ref 46–116)
ALT (SGPT): 55 U/L — ABNORMAL HIGH (ref 10–49)
ANION GAP: 7 mmol/L (ref 5–14)
AST (SGOT): 54 U/L — ABNORMAL HIGH (ref <=34)
BILIRUBIN TOTAL: 0.9 mg/dL (ref 0.3–1.2)
BLOOD UREA NITROGEN: 35 mg/dL — ABNORMAL HIGH (ref 9–23)
BUN / CREAT RATIO: 24
CALCIUM: 9.1 mg/dL (ref 8.7–10.4)
CHLORIDE: 113 mmol/L — ABNORMAL HIGH (ref 98–107)
CO2: 20 mmol/L (ref 20.0–31.0)
CREATININE: 1.48 mg/dL — ABNORMAL HIGH
EGFR CKD-EPI (2021) MALE: 48 mL/min/{1.73_m2} — ABNORMAL LOW (ref >=60)
GLUCOSE RANDOM: 124 mg/dL (ref 70–179)
POTASSIUM: 4.8 mmol/L (ref 3.5–5.1)
PROTEIN TOTAL: 7.5 g/dL (ref 5.7–8.2)
SODIUM: 140 mmol/L (ref 135–145)

## 2023-09-03 LAB — PROTIME-INR
INR: 1.12
PROTIME: 12.5 s (ref 9.9–12.6)

## 2023-09-03 LAB — CBC
HEMATOCRIT: 41.2 % (ref 39.0–48.0)
HEMOGLOBIN: 14.2 g/dL (ref 12.9–16.5)
MEAN CORPUSCULAR HEMOGLOBIN CONC: 34.4 g/dL (ref 32.0–36.0)
MEAN CORPUSCULAR HEMOGLOBIN: 33.2 pg — ABNORMAL HIGH (ref 25.9–32.4)
MEAN CORPUSCULAR VOLUME: 96.5 fL — ABNORMAL HIGH (ref 77.6–95.7)
MEAN PLATELET VOLUME: 9.6 fL (ref 6.8–10.7)
PLATELET COUNT: 171 10*9/L (ref 150–450)
RED BLOOD CELL COUNT: 4.27 10*12/L (ref 4.26–5.60)
RED CELL DISTRIBUTION WIDTH: 15.6 % — ABNORMAL HIGH (ref 12.2–15.2)
WBC ADJUSTED: 10.9 10*9/L (ref 3.6–11.2)

## 2023-09-03 LAB — AFP TUMOR MARKER: AFP-TUMOR MARKER: 10 ng/mL — ABNORMAL HIGH (ref <=8)

## 2023-09-03 MED ADMIN — lidocaine (LMX) 4 % cream: TOPICAL | @ 12:00:00 | Stop: 2023-09-03

## 2023-09-03 MED ADMIN — fentaNYL (PF) (SUBLIMAZE) injection: INTRAVENOUS | @ 13:00:00 | Stop: 2023-09-03

## 2023-09-03 MED ADMIN — fentaNYL (PF) (SUBLIMAZE) injection: INTRAVENOUS | @ 15:00:00 | Stop: 2023-09-03

## 2023-09-03 MED ADMIN — iohexol (OMNIPAQUE) 300 mg iodine/mL solution 100 mL: 100 mL | @ 16:00:00 | Stop: 2023-09-03

## 2023-09-03 MED ADMIN — fentaNYL (PF) (SUBLIMAZE) injection: INTRAVENOUS | @ 16:00:00 | Stop: 2023-09-03

## 2023-09-03 MED ADMIN — Tc-99m Macroagragated Albumin (MAA): 3 | INTRAVENOUS | @ 16:00:00 | Stop: 2023-09-03

## 2023-09-03 MED ADMIN — midazolam (VERSED) injection 1 mg: 1 mg | INTRAVENOUS | @ 13:00:00 | Stop: 2023-09-03

## 2023-09-03 MED ADMIN — nitroglycerin (NITROSTAT) 2 % ointment 0.5 inch: .5 [in_us] | TOPICAL | @ 12:00:00 | Stop: 2023-09-03

## 2023-09-03 MED ADMIN — sodium chloride (NS) 0.9 % infusion: INTRAVENOUS | @ 13:00:00 | Stop: 2023-09-03

## 2023-09-03 MED ADMIN — verapamil (ISOPTIN) injection: INTRA_ARTERIAL | @ 13:00:00 | Stop: 2023-09-03

## 2023-09-03 MED ADMIN — midazolam (VERSED) injection: INTRAVENOUS | @ 13:00:00 | Stop: 2023-09-03

## 2023-09-03 MED ADMIN — heparin (porcine) 1000 unit/mL injection: INTRA_ARTERIAL | @ 13:00:00 | Stop: 2023-09-03

## 2023-09-03 MED ADMIN — nitroglycerin 400 mcg/mL in D5W bolus injection: INTRA_ARTERIAL | @ 13:00:00 | Stop: 2023-09-03

## 2023-09-03 NOTE — Unmapped (Addendum)
[facility] [place] INTERVENTIONAL RADIOLOGY - Pre Procedure H/P  Patient name: Thomas Rosales  CSN: [id number]  MRN: [id number]  Date of Procedure: @TODAY @      Assessment/Plan:    Thomas Rosales is a 82 y.o. male who will undergo Tc-35m MAA mapping in Interventional Radiology.    Consent obtained in clinic by Dr. Ciro Backer on 08/14/2023 and is accurate.  Consent was witnessed and scanned into the medical record.  Risks, benefits and alternatives including but not limited to bowel injury, fistula, abdominal pain, bleeding were discussed in-depth with patient/patient's authorizing representative.   --The patient will accept blood products in an emergent situation.  --The patient does not have a Do Not Resuscitate order in effect.      HPI: Thomas Rosales is a 82 y.o. 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 Pt is not a great surgical candidate 2/2 extensive surgery history and Crohn's disease. Here for MAA shunt study.     Past Medical History:   Diagnosis Date    Anal fissure 1983    Apnea, sleep     uses CPAP at night. instructed to bring DOS.     Basal cell carcinoma     Cancer (CMS-HCC)     skin cancer    CHF (congestive heart failure) (CMS-HCC)     Cirrhosis (CMS-HCC) 04/15/2023    Crohn's disease (CMS-HCC)     Diverticulitis of colon     Fatty liver     Gallstones     GERD (gastroesophageal reflux disease)     H/O echocardiogram     Hypertension     Irritable bowel syndrome     Kidney stone     NASH (nonalcoholic steatohepatitis)     Syncope     dizziness    Type 2 diabetes mellitus, without long-term current use of insulin (CMS-HCC) 10/24/2022       Past Surgical History:   Procedure Laterality Date    APPENDECTOMY      BOWEL RESECTION  1981, 1984, 1995    3 ft small intestine, <1 ft large intestines, cecum removed; 2nd bowel resection anal fistulas removed; 3rd bowel resection most of colon removed, colostomy placed.    CARDIAC CATHETERIZATION CARDIOVASCULAR STRESS TEST      CHOLECYSTECTOMY      COLON SURGERY      Same as small bowel surgery    COLOSTOMY  1995    EYE SURGERY  1/20 & 4/21    Cataract & vitrious removal    FRACTURE SURGERY  1961    Broken tibia and fibula    HERNIA REPAIR  2019    Done by Dr Day    LAPAROSCOPIC SMALL BOWEL RESECTION      LEG SURGERY Left     pins in shin    LITHOTRIPSY      PR COLONOSCOPY FLX DX W/COLLJ SPEC WHEN PFRMD  03/19/2018    Procedure: COLONOSCOPY WITH POSSIBLE BIOPSY, POSSIBLE POLYPECTOMY.;  Surgeon: Tressie Ellis, MD;  Location: ENDO OR [facility];  Service: Gastroenterology    PR COLSC FLEXIBLE W/TRANSENDOSCOPIC BALLOON DILAT N/A 09/13/2022    Procedure: COLONOSCOPY, FLEXIBLE; WITH DILATION BY BALLOON, 1 OR MORE STRICTURES;  Surgeon: Luanne Bras, MD;  Location: [facility] PROCEDURES [facility];  Service: Gastroenterology    PR INCISION & DRAINAGE ABSCESS SIMPLE/SINGLE N/A 08/11/2018    Procedure: INCISION AND DRAINAGE OF ABDOMINAL WALL ABSCESS;  Surgeon: Jerry Caras Day,  MD;  Location: OR [place];  Service: General Surgery    PR LAP, INCISIONAL HERNIA REPAIR,REDUCIBLE N/A 06/02/2018    Procedure: LAPAROSCOPIC ASSISTED PARASTOMAL HERNIA REPAIR AND INCISIONAL HERNIA REPAIR WITH MESH;  Surgeon: Jerry Caras Day, MD;  Location: OR [place];  Service: General Surgery    PR VITRECTOMY,MECHANICAL Right 03/28/2020    Procedure: trans pars planar vitrectomy, right eye   ;  Surgeon: Emi Holes, MD;  Location: [facility] OR [place] [facility] [place];  Service: Ophthalmology    PR XCAPSL CTRC RMVL INSJ IO LENS PROSTH W/O ECP Right 01/13/2019    Procedure: PHACOEMULSIFICATION OF CATARACT WITH INSERTION OF INTRAOCULAR LENS/TORIC LENS/ORA;  Surgeon: Juanna Cao, MD;  Location: OR [facility];  Service: Ophthalmology    PR XCAPSL CTRC RMVL INSJ IO LENS PROSTH W/O ECP Left 01/27/2019    Procedure: PHACOEMULSIFICATION OF CATARACT WITH INSERTION OF INTRAOCULAR LENS/TORIC LENS/ORA;  Surgeon: Juanna Cao, MD;  Location: OR [facility];  Service: Ophthalmology    RESECTION SMALL BOWEL / CLOSURE ILEOSTOMY  1981    temporary ileostomy     REVISION COLOSTOMY  2009    SMALL INTESTINE SURGERY  3x since 1984    Crohn???s disease    URETERAL STENT PLACEMENT  2008        Allergies: No Known Allergies    Medications:  No relevant medications, please see full medication list in Epic.    ASA Grade: ASA 2 - Patient with mild systemic disease with no functional limitations    PE:    Vitals:    09/03/23 0738   BP: 134/57   Pulse: 81   Resp: 16   Temp: 36.6 ??C (97.9 ??F)   SpO2: 99%     General: male in NAD.  Airway assessment: Class 1 - Can visualize soft palate, fauces, uvula, and tonsillar pillars  Lungs: Respirations nonlabored    [place] A

## 2023-09-03 NOTE — Unmapped (Signed)
Discharge instructions reviewed and given to pt and family. Understanding verbalized. PIV removed with tip intact. Home supplies given for dressing changes. Pt ambulatory without assistance. Discharged from PRU in stable condition.

## 2023-09-03 NOTE — Unmapped (Signed)
VIR Post-Procedure Note    Procedure Name: Y90 Mapping    Pre-Op Diagnosis: Seg 5 LR-M Lesion    Post-Op Diagnosis: Same as pre-operative diagnosis    VIR Providers    Attending: Dr. Rosalio Loud  Fellow/Resident: Dr. Leone Payor    Time out: Prior to the procedure, a time out was performed with all team members present. During the time out, the patient, procedure and procedure site when applicable were verbally verified by the team members and Dr. Leone Payor.    Description of procedure:   Y90 mapping of the seg 5 lesion. 1 mCi of MAA injected at two separate feeding vessels supplying the tumor.     Left radial TR band with 13 ml of air.    Sedation: Moderate    Estimated Blood Loss: Minimal  Specimens: None   Complications: None    Plan:  --SPECT/CT in Nuc Med  --Standard TR band release protocol  --Return in 2 weeks for planned TACE vs TARE pending Mapping results.     See detailed procedure note with images in PACS.    The patient tolerated the procedure well without incident or complication and was transported from VIR in stable condition.    Odelia Gage, MD  [facility], PGY-6  09/03/2023 12:13 PM

## 2023-09-04 LAB — SURGICAL PATHOLOGY

## 2023-09-08 ENCOUNTER — Telehealth: Payer: Self-pay

## 2023-09-08 NOTE — Telephone Encounter (Signed)
Discussed pathology results. Excision scheduled for 10/08/2023 at 9:30 am. Patient voiced understanding.

## 2023-09-08 NOTE — Telephone Encounter (Signed)
-----   Message from [facility] sent at 09/05/2023  4:09 PM EDT ----- Diagnosis:      1. Skin, R forearm :       WELL DIFFERENTIATED SQUAMOUS CELL CARCINOMA        2. Skin, R hand dorsal :       BENIGN EPIDERMAL HYPERPLASIA WITH HYPERKERATOSIS, SEE DESCRIPTION   FOR right forearm Please call to share diagnosis and offer excision only. If patient prefers to avoid excision, offer EDC with the caveat that there is a high risk of recurrence and the need for subsequent excision.  Explanation: This is a squamous cell skin cancer that has grown beyond the surface of the skin and is invading the second layer of the skin. It has the potential to spread beyond the skin and threaten your health, so I recommend treating it.  Treatment option 1: you return for an hour long appointment where we perform a skin surgery. We numb the site of the skin cancer and a safety margin of normal skin around it. We remove the full thickness of skin and close the wound with two layers of stitches. The sample is sent to the lab to check that the skin cancer was fully removed. Return one week later to have wound checked and surface stitches removed. Surgical wound leaves a line scar. Approximately 95% cure rate  Alternative (not recommended): you return for a 15 minute appointment where we perform electrodesiccation and curettage [facility]). This involves three rounds of scraping and burning to destroy the skin cancer. It leaves a round wound slightly larger than the skin cancer and leaves a round white scar. No additional pathology is done. If the skin cancer comes back, we would need to do a surgery to remove it.    FOR right hand:  Explanation: the spot we removed was a benign thickening of skin from chronic picking or scratching (similar to a callus). There was NO skin cancer. No treatment is needed

## 2023-09-10 NOTE — Unmapped (Signed)
Abstraction Result Flowsheet Data    This patient's last AWV date: [facility] Last Medicare Wellness Visit Date: 04/19/2021  This patients last WCC/CPE date: : Not Found      Reason for Encounter  Reason for Encounter: Outreach  Primary Reason for Outreach: AWV  Text Message: No  MyChart Message: No  Outreach Call Outcome: No voicemail available

## 2023-09-16 NOTE — Unmapped (Signed)
VIR BRIEF NOTE     Analysis of the Y90 Mapping 09/03/23 demonstrate that the [facility] is close to the bowel - which increases the risk for bowel injury (particularly in the setting of complex Chron's disease history).     Given this, we will pursue TACE locoregional therapy on 09/26/23 (not Y90-RE). The patient is aware and understands.     Rosalio Loud, MD  Assistant Professor of Radiology  Division of Vascular and Interventional Radiology  [facility]

## 2023-09-23 NOTE — Unmapped (Signed)
VIR pre procedure prep call completed. Reviewed to register at 0820 on ground floor [facility] then proceed to [facility]IR [facility] for [facility].  Informed of no show/late cancellation policy. NPO guidelines reviewed. Pt OK to take sips of clear liquids with all AM meds.  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 Made aware of visitation policy. Pt verbalized understanding. All questions answered.

## 2023-09-26 ENCOUNTER — Ambulatory Visit: Admit: 2023-09-26 | Discharge: 2023-09-26 | Payer: MEDICARE

## 2023-09-26 DIAGNOSIS — C220 Liver cell carcinoma: Principal | ICD-10-CM

## 2023-09-26 LAB — COMPREHENSIVE METABOLIC PANEL
ALBUMIN: 3.1 g/dL — ABNORMAL LOW (ref 3.4–5.0)
ALKALINE PHOSPHATASE: 202 U/L — ABNORMAL HIGH (ref 46–116)
ALT (SGPT): 54 U/L — ABNORMAL HIGH (ref 10–49)
ANION GAP: 6 mmol/L (ref 5–14)
AST (SGOT): 56 U/L — ABNORMAL HIGH (ref <=34)
BILIRUBIN TOTAL: 0.9 mg/dL (ref 0.3–1.2)
BLOOD UREA NITROGEN: 20 mg/dL (ref 9–23)
BUN / CREAT RATIO: 17
CALCIUM: 9.1 mg/dL (ref 8.7–10.4)
CHLORIDE: 113 mmol/L — ABNORMAL HIGH (ref 98–107)
CO2: 22 mmol/L (ref 20.0–31.0)
CREATININE: 1.19 mg/dL — ABNORMAL HIGH
EGFR CKD-EPI (2021) MALE: 62 mL/min/{1.73_m2} (ref >=60)
GLUCOSE RANDOM: 129 mg/dL (ref 70–179)
POTASSIUM: 4.7 mmol/L (ref 3.5–5.1)
PROTEIN TOTAL: 7.4 g/dL (ref 5.7–8.2)
SODIUM: 141 mmol/L (ref 135–145)

## 2023-09-26 LAB — AFP TUMOR MARKER: AFP-TUMOR MARKER: 12 ng/mL — ABNORMAL HIGH (ref <=8)

## 2023-09-26 LAB — CBC
HEMATOCRIT: 37.2 % — ABNORMAL LOW (ref 39.0–48.0)
HEMOGLOBIN: 13.1 g/dL (ref 12.9–16.5)
MEAN CORPUSCULAR HEMOGLOBIN CONC: 35.3 g/dL (ref 32.0–36.0)
MEAN CORPUSCULAR HEMOGLOBIN: 33.8 pg — ABNORMAL HIGH (ref 25.9–32.4)
MEAN CORPUSCULAR VOLUME: 95.7 fL (ref 77.6–95.7)
MEAN PLATELET VOLUME: 9.5 fL (ref 6.8–10.7)
PLATELET COUNT: 154 10*9/L (ref 150–450)
RED BLOOD CELL COUNT: 3.89 10*12/L — ABNORMAL LOW (ref 4.26–5.60)
RED CELL DISTRIBUTION WIDTH: 15.1 % (ref 12.2–15.2)
WBC ADJUSTED: 9.2 10*9/L (ref 3.6–11.2)

## 2023-09-26 LAB — PROTIME-INR
INR: 1.08
PROTIME: 12.1 s (ref 9.9–12.6)

## 2023-09-26 MED ORDER — ONDANSETRON HCL 4 MG TABLET
ORAL_TABLET | Freq: Three times a day (TID) | ORAL | 0 refills | 4 days | Status: CP | PRN
Start: 2023-09-26 — End: 2023-10-01
  Filled 2023-09-26: qty 10, 4d supply, fill #0

## 2023-09-26 MED ORDER — LEVOFLOXACIN 500 MG TABLET
ORAL_TABLET | Freq: Every day | ORAL | 0 refills | 7 days | Status: CP
Start: 2023-09-26 — End: ?
  Filled 2023-09-26: qty 7, 7d supply, fill #0

## 2023-09-26 MED ORDER — OXYCODONE 5 MG TABLET
ORAL_TABLET | Freq: Four times a day (QID) | ORAL | 0 refills | 3.00000 days | Status: CP | PRN
Start: 2023-09-26 — End: 2023-09-26

## 2023-09-26 MED ADMIN — iohexol (OMNIPAQUE) 300 mg iodine/mL solution 55 mL: 55 mL | INTRA_ARTERIAL | @ 15:00:00 | Stop: 2023-09-26

## 2023-09-26 MED ADMIN — lidocaine (LMX) 4 % cream: TOPICAL | @ 13:00:00 | Stop: 2023-09-26

## 2023-09-26 MED ADMIN — DOXOrubicin (ADRIAMYCIN) 50 mg, LC beads 100-300 micron 1 each, iohexol (OMNIPAQUE) 300 mg iodine/mL 600 mg chemoembolization: 50 mg | INTRA_ARTERIAL | @ 17:00:00 | Stop: 2023-09-26

## 2023-09-26 MED ADMIN — fentaNYL (PF) (SUBLIMAZE) injection: INTRAVENOUS | @ 15:00:00 | Stop: 2023-09-26

## 2023-09-26 MED ADMIN — nitroglycerin (NITROSTAT) 2 % ointment 0.5 inch: .5 [in_us] | TOPICAL | @ 15:00:00 | Stop: 2023-09-26

## 2023-09-26 MED ADMIN — nitroglycerin 400 mcg/mL in D5W bolus injection: INTRA_ARTERIAL | @ 15:00:00 | Stop: 2023-09-26

## 2023-09-26 MED ADMIN — nitroglycerin (NITROSTAT) 2 % ointment 0.5 inch: .5 [in_us] | TOPICAL | @ 13:00:00 | Stop: 2023-09-26

## 2023-09-26 MED ADMIN — midazolam (VERSED) injection: INTRAVENOUS | @ 15:00:00 | Stop: 2023-09-26

## 2023-09-26 MED ADMIN — lidocaine (PF) (XYLOCAINE-MPF) 10 mg/mL (1 %) injection: INTRADERMAL | @ 15:00:00 | Stop: 2023-09-26

## 2023-09-26 MED ADMIN — fentaNYL (PF) (SUBLIMAZE) injection: INTRAVENOUS | @ 16:00:00 | Stop: 2023-09-26

## 2023-09-26 MED ADMIN — dexAMETHasone (DECADRON) 4 mg/mL injection 8 mg: 8 mg | INTRAVENOUS | @ 14:00:00 | Stop: 2023-09-26

## 2023-09-26 MED ADMIN — ondansetron (ZOFRAN) injection 8 mg: 8 mg | INTRAVENOUS | @ 14:00:00 | Stop: 2023-09-26

## 2023-09-26 MED ADMIN — ampicillin-sulbactam (UNASYN) 3 g in sodium chloride 0.9 % (NS) 100 mL IVPB-MBP: 3 g | INTRAVENOUS | @ 15:00:00 | Stop: 2023-09-26

## 2023-09-26 MED ADMIN — lidocaine (PF) (XYLOCAINE-MPF) 10 mg/mL (1 %) injection: @ 17:00:00 | Stop: 2023-09-26

## 2023-09-26 MED ADMIN — midazolam (VERSED) injection: INTRAVENOUS | @ 16:00:00 | Stop: 2023-09-26

## 2023-09-26 MED ADMIN — midazolam (VERSED) injection 1 mg: 1 mg | INTRAVENOUS | @ 15:00:00 | Stop: 2023-09-26

## 2023-09-26 MED ADMIN — heparin (porcine) 1000 unit/mL injection: INTRA_ARTERIAL | @ 15:00:00 | Stop: 2023-09-26

## 2023-09-26 MED ADMIN — verapamil (ISOPTIN) injection: INTRA_ARTERIAL | @ 15:00:00 | Stop: 2023-09-26

## 2023-09-26 NOTE — Unmapped (Signed)
Discharge instructions reviewed and given to pt and family. Understanding verbalized. PIV removed with tip intact. Home supplies given for dressing changes. Pt ambulatory without assistance. Discharged from PRU in stable condition.

## 2023-09-26 NOTE — Unmapped (Signed)
[place] INTERVENTIONAL RADIOLOGY - Operative Note     VIR Post-Procedure Note    Procedure Name: TACE    Pre-Op Diagnosis: HCC    Post-Op Diagnosis: Same as pre-operative diagnosis    VIR Providers    Attending: Rosalio Loud, MD  Resident: Lillie Columbia, MD    Description of procedure: Successful transarterial chemoembolization of hepatic segment 5 lesion with 2.5 ml of doxorubicin administered. Left radial access, TR band for hemostasis.    Estimated Blood Loss: approximately < 10 mL  Complications: None    See detailed procedure note with images in PACS [facility]).    The patient tolerated the procedure well without incident or complication and left the room in stable condition.    Plan:  - Follow-up clinic visit + MRI abdomen w/ contrast with Dr. Ciro Backer in 6 weeks    Lillie Columbia, MD  09/26/2023 12:49 PM

## 2023-09-26 NOTE — Unmapped (Signed)
[place] INTERVENTIONAL RADIOLOGY - Pre Procedure H/P      Assessment/Plan:    Thomas Rosales is a 82 y.o. male who will undergo TACE in Interventional Radiology.    --This procedure has been fully reviewed with the patient/patient???s authorized representative. The risks, benefits and alternatives including but not limited to bowel injury, fistula, abdominal pain, bleeding have been explained, and the patient/patient???s authorized representative has consented to the procedure.  --The patient will accept blood products in an emergent situation.  --The patient has a Do Not Resuscitate order in effect, which will be suspended during the procedure.      HPI: Thomas Rosales is a 82 y.o. 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 Pt is not a great surgical candidate 2/2 extensive surgery history and Crohn's disease. He had y90 mapping done on 9/25 was deemed not a great TARE candidate due to [facility] being so close to the bowel. Pt here today for TACE of segment 5 lesion.      Allergies: No Known Allergies    Medications:  No relevant medications, please see full medication list in Epic.    ASA Grade: ASA 2 - Patient with mild systemic disease with no functional limitations    PE:    There were no vitals filed for this visit.  General: male in NAD.  Airway assessment: Class 1 - Can visualize soft palate, fauces, uvula, and tonsillar pillars  Lungs: Respirations nonlabored  Ext: Left radial artery, [place] A        Lillie Columbia, MD  VIR Resident, PGY5

## 2023-10-03 NOTE — Unmapped (Signed)
REASON FOR VISIT:  Crohn's disease    HISTORY OF PRESENT ILLNESS:  Since last visit,  04/12/2023, admitted for SBO.  Treated with NG tube.  05/15/2023, admitted for SBO.  Treated with NG tube.  05/16/2023, CTAP.  Cirrhotic liver.  2.3 cm liver lesion.  1.2 cm pancreatic tail cyst.  Mild biliary ductal dilatation.  Multiple dilated loops of small bowel tethered to the anterior abdominal wall with suspected transition point in mid lower abdomen.  Left lower quadrant colostomy.  Persistent gas locules in anterior abdomen near umbilicus suggestive of improving enterocutaneous fistula.  Moderate sized parastomal hernia containing small bowel loops.  06/05/2023, hepatology visit.  Repeat AFP and MRI  06/09/2023, CMP, INR normal except CO2 17, BUN 29, glucose 114, albumin 3.7, AST 53, alk phos 239  06/27/2023, MRI abdomen.  2.9 cm segment 5 liver lesion concerning for HCC.  Cirrhosis with portal hypertension.  Stable 8 mm pancreatic tail cyst.  Stable indeterminate 1.4 cm mesenteric soft tissue nodule in left abdomen.  Parastomal hernia.  No bowel obstruction.  Trace ascites.  Previously noted enterocutaneous fistula at the umbilicus is poorly seen today due to susceptibility artifact.  09/26/2023, TACE for [facility]  09/26/2023, CBC, CMP normal except creatinine 1.19, AST 56, ALT 54, alk phos 202.    Since hospitalization, has had 2 episodes of pSBO (distended abdomen, colicky abd pain) that he self treated at home by NPO, inducing vomiting and massaging abdomen. Each episode resolved last 10-15 hrs. Last episode a few months ago. In between episodes, he is asymptomatic with the exception of fatigue.  Stable soft-liquid nonbloody ostomy output.  Red peristomal skin that weeps clear fluid--x yrs. 1 week wear time.  No drainage from supposed EC fistula at sugical scar in the last 4 months. Taking stelara q 2 months.    MEDICATIONS:  has a current medication list which includes the following prescription(s): glucose blood, blood-glucose meter, cholecalciferol (vitamin d3-25 mcg (1,000 unit)), epinephrine, magnesium oxide, multivitamin, pantoprazole, propylene glycol/peg 400, rifaximin, spironolactone, syringe (disposable), stelara, vit c/e/zn/coppr/lutein/zeaxan, and white petrolatum.    ALLERGIES:  Allergies as of 10/06/2023    (No Known Allergies)       PAST MEDICAL HISTORY:  1. ileocolonic Crohn's disease with perianal fistula.  Symptoms started in 1960s.  Diagnosed 1973.  Sulfasalazine and decades of prednisone.  1983.  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  Ileostomy reversal.  1995, left hemicolectomy with end colostomy and Hartman's pouch.  Tapered off prednisone in mid 90s.  Started infliximab followed by certolizumab both which had primary nonresponse.  Started adalimumab which worked well for 10 years.  Steroid free remission.  2009, recurrent SBO treated conservatively and with prednisone.  Required several small bowel resections during this time.  2018, new onset heart failure (EF 30%) thought to be due to anti-TNF.  Stopped adalimumab with improvement in ejection fraction.  Asymptomatic off therapy x3 years.  2019, parastomal hernia repair and ventral incisional hernia repair with revision of stoma and biodegradable mesh placement.  Postop abscess that resolved.  05/2020, SBO probably due to adhesions treated with lysis of adhesions.  Poor wound healing postoperatively.  06/2020, start ustekinumab prophylactically to help maintain remission even though no active Crohn's at time of surgery. 12/28/2020, SBO-probably adhesions or hernia, treated with supportive care. 12/03/2021, admitted for SBO.  Treated with supportive care. 05/04/22, hospitalized for SBO.  Treated with supportive care. 09/13/2022, colonoscopy with fibrotic anastomotic stenosis that was balloon dilated. 10/22/2022,  enterocutaneous fistula drainage started. 10/23/2022 - 11/07/2022, hospitalized to start TPN.  Complicated by volume overload treated with diuretics. 11/15/2022 - 11/22/2022, hospitalized for edema due to volume overload.  Treated with diuretics.  TPN stopped.  Diagnosed with new HCC and liver cirrhosis.  04/12/2023, 05/15/2023, admitted for SBO likely due to adhesions.  2.  Sleep apnea  3.  Congestive heart failure, associated with anti-TNF (adalimumab). 11/18/2022, TTE.  EF 55%.  Grade 2 diastolic dysfunction.  Moderately dilated left atrium.  Elevated right atrial pressures consistent with fluid overload (?TPN).  4.  Heartburn  5.  Hypertension  6.  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 09/26/2023, TACE for HCC.  7.  Kidney stones  8.  Skin cancer  9.  06/2018, PPD negative  10.  03/2017, PPSV23  11.  COVID-19 mRNA vaccines x2   12. Osteoporosis    SOCIAL HISTORY:  Quit smoking.  Married. Retired [name], [employer].    FAMILY HISTORY:  No IBD.  Father had colon cancer in his 73s.    PHYSICAL EXAM:  BP 135/71 (BP Site: R Arm, BP Position: Sitting)  - Pulse 79  - Temp 36.7 ??C (98 ??F) (Temporal)  - Ht 167.6 cm (5' 6)  - Wt 73.5 kg (162 lb)  - BMI 26.15 kg/m??   Wt Readings from Last 10 Encounters:   10/06/23 73.5 kg (162 lb)   09/26/23 69.9 kg (154 lb)   09/03/23 69.9 kg (154 lb)   08/14/23 71.2 kg (157 lb)   08/05/23 71.2 kg (157 lb)   07/23/23 71.4 kg (157 lb 4.8 oz)   06/05/23 70.2 kg (154 lb 12.8 oz)   05/15/23 68.9 kg (151 lb 12.8 oz)   04/12/23 70.3 kg (155 lb)   04/07/23 72.8 kg (160 lb 6.4 oz)     CONSTITUTIONAL: awake, alert, NAD, appears much healthier than prior.  Abdomen: soft, nontender. left lower quadrant ostomy with pasty yellow stool.  Midline surgical scar appears healed without any draining lesions.    TEST DATA:  1.  03/19/2018, colonoscopy.  Patent end sigmoid colostomy characterized by erythema and friable mucosa that was biopsied.  Patent end-to-end ileocolonic anastomosis characterized by edema, erosions, erythema, friability.  Normal ileum.  No obvious evidence for active Crohn's disease.    2.  09/22/2020, CT abdomen pelvis.  Postsurgical changes of abdominal wall.  No fluid collection.  No fistula.  No signs of active IBD.  Hepatic steatosis.  Spine compression deformities.  Aortic atherosclerosis.  3.  10/31/2020, CT enterography.  Left lower quadrant end colostomy with parastomal hernia.  Status post right hemicolectomy with ileocolonic anastomosis.  1.1 cm descending colon lipoma.  No active bowel inflammation.  4. 12/27/2020, CTAP without contrast.  Status post right hemicolectomy and loop colostomy.  Parastomal hernia containing fat and multiple dilated small bowel loops.  No discrete transition point.  Bowel loops adhered to the anterior abdominal wall where surrounding mesenteric stranding is present.  5. 02/28/2021, QDR. L1-4 T=-0.8, Left femoral neck T= -2.8, Right femoral neck T=-3.1  6. 12/03/2021, CTAP with IV contrast only.  Partial SBO with transition point in left upper quadrant.  Likely due to adhesion.  7. 05/04/22, CTAP w IV contrast. Steatosis. 8mm CBD. 3.8cm SB loops with transition point proximal to ileocolonic anastomosis-suspect adhesions. 9.7cm fluid and gas collection in ventral abdomen between SB loops. ? Fistula.  8. 05/07/2022, CTAP w IV/PO contrast. 1cm pancreatic cyst (stable), small peristomal hernia. Resolution of sbo. Colon  diverticula. ? 14.6cm fistula-like collection near anterior abdominal wall.  9. 09/13/2022, colonoscopy to TI x 8 cm.  8 mm diameter by 8 mm long noninflamed stenosis at ICA.  Dilated to 12 mm.  No mucosal disruption.  No active inflammation in the ileum or colon.  10. 11/13/2022, CTE.  Resolution of large abdominal wall fluid collection.  Small bowel adhesions to anterior abdominal wall with gas bubbles.  Parastomal hernia containing small bowel.  Wall enhancement and stranding of colon and duodenum.  Enlarging liver lesions.  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  Several 4.5 cm small bowel loops without clear transition point.  Wall thickening and enhancement in portion of small bowel.  Ascites, body wall edema, scrotal hydrocele suggesting fluid overload.  11. 11/17/2022, MRI abdomen.  1.5 cm liver lesion in segment 5.  LR-5 definite hepatocellular carcinoma.  1.1 cm segment 7 LR-3 indeterminate lesion.  9 mm pancreatic tail cyst.  Diffuse wall edema of stomach and colon.  12. 11/21/2022, HBsAb, HBctotal, HBsAg all neg. HCV Ab pos. HCV RNA not detected.  13. 12/17/2022, CTAP.  4.4 cm dilated small bowel without transition point.  Small bowel adhesions.  Rectal stump tethered to anterior abdominal wall.  Increased size of fluid collection in anterior abdominal wall with new locules of gas.  Parastomal hernia containing small bowel.  1.4 cm liver nodule in segment 5.  Pancreatic tail cyst.  Nonobstructive kidney stones (1.5-1.8 cm)  14. 01/20/2023, cirrhotic liver.  Small volume ascites.  Trace pleural effusion.  Stable LR-5 lesion in segment 5.  Mild increase in LR-3 lesion in segment 7 (1.2cm).  Recommend follow-up in 3-6 months.  Stable 7 mm pancreatic tail cyst.  1.4 cm mesenteric nodule in left abdomen.  Indeterminate.  15. 05/16/2023, CTAP.  Cirrhotic liver.  2.3 cm liver lesion.  1.2 cm pancreatic tail cyst.  Mild biliary ductal dilatation.  Multiple dilated loops of small bowel tethered to the anterior abdominal wall with suspected transition point in mid lower abdomen.  Left lower quadrant colostomy.  Persistent gas locules in anterior abdomen near umbilicus suggestive of improving enterocutaneous fistula.  Moderate sized parastomal hernia containing small bowel loops.  16. 06/27/2023, MRI abdomen.  2.9 cm segment 5 liver lesion concerning for HCC.  Cirrhosis with portal hypertension.  Stable 8 mm pancreatic tail cyst.  Stable indeterminate 1.4 cm mesenteric soft tissue nodule in left abdomen.  Parastomal hernia.  No bowel obstruction.  Trace ascites.  Previously noted enterocutaneous fistula at the umbilicus is poorly seen today due to susceptibility artifact.    ASSESSMENT:  1.  Ileocolonic Crohn's disease with history of perianal fistula.  Status post right hemicolectomy with ileocolonic anastomosis and left-sided end colostomy.  Recurrent hospitalizations for small bowel obstruction.  Probably due to adhesions or fibrotic stenosis at the anastomosis.  I doubt that he has active inflammatory Crohn's disease that would benefit from medication adjustments.   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  Currently inactive.  Currently in symptomatic remission on ustekinumab.  Continue ustekinumab every 8 weeks as before.  Reviewed risks including risks of infection.  Could consider repeat colonoscopy with balloon dilation of the anastomotic stricture if needed though I think his typical transition point is more proximal.  2. MASLD cirrhosis with HCC and encephalopathy.  No signs of encephalopathy or ascites today.  Does have peripheral lower extremity edema which is chronic problem for him.  Continue follow-up with Dr. Octaviano Glow.  3.  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  I sent in rx to your pharmacy.  -Follow up with Dr. Octaviano Glow regarding [facility] and cirrhosis.  -If blockage episode is severe or lasts more than a day, you will need to go to the hospital.  -Clinic visit with me in 6 months or sooner if needed.    I personally spent 30 minutes face-to-face and non-face-to-face in the care of this patient, which includes all pre, intra, and post visit time on the date of service.  All documented time was specific to the E/M visit and does not include any procedures that may have been performed.

## 2023-10-06 ENCOUNTER — Ambulatory Visit: Admit: 2023-10-06 | Discharge: 2023-10-06 | Payer: MEDICARE | Attending: Gastroenterology | Primary: Gastroenterology

## 2023-10-06 DIAGNOSIS — K50013 Crohn's disease of small intestine with fistula: Principal | ICD-10-CM

## 2023-10-06 MED ORDER — ONDANSETRON 4 MG DISINTEGRATING TABLET
ORAL_TABLET | Freq: Three times a day (TID) | 4 refills | 10 days | Status: CP | PRN
Start: 2023-10-06 — End: ?

## 2023-10-06 NOTE — Unmapped (Addendum)
-  Continue stelara every 2 months for now  -Flu shot today  -Zofran dissolvable tablets as needed for episodes of nausea.  I sent in rx to your pharmacy.  -Follow up with Dr. Octaviano Glow regarding [facility] and cirrhosis.  -If blockage episode is severe or lasts more than a day, you will need to go to the hospital.  -Clinic visit with me in 6 months or sooner if needed.

## 2023-10-06 NOTE — Unmapped (Signed)
Post procedure call completed. Pt s/p TARE on 09/26/23. Pt stated that he is doing well, no issues or concerns. Left wrist looked normal and he has no bruising or swelling. Pt is aware to not overdo things and he said he is taking time to rest and recover. No further questions addressed at this time. Advised to call back for any issues or concerns and has verbalized understanding.

## 2023-10-07 MED ORDER — ONDANSETRON 4 MG DISINTEGRATING TABLET
ORAL_TABLET | 4 refills | 0 days | Status: CP
Start: 2023-10-07 — End: ?

## 2023-10-08 ENCOUNTER — Ambulatory Visit: Payer: Medicare Other | Admitting: Dermatology

## 2023-10-08 ENCOUNTER — Encounter: Payer: Self-pay | Admitting: Dermatology

## 2023-10-08 DIAGNOSIS — C44622 Squamous cell carcinoma of skin of right upper limb, including shoulder: Secondary | ICD-10-CM | POA: Diagnosis not present

## 2023-10-08 DIAGNOSIS — D489 Neoplasm of uncertain behavior, unspecified: Secondary | ICD-10-CM

## 2023-10-08 DIAGNOSIS — D492 Neoplasm of unspecified behavior of bone, soft tissue, and skin: Secondary | ICD-10-CM

## 2023-10-08 DIAGNOSIS — C4492 Squamous cell carcinoma of skin, unspecified: Secondary | ICD-10-CM

## 2023-10-08 DIAGNOSIS — D0462 Carcinoma in situ of skin of left upper limb, including shoulder: Secondary | ICD-10-CM

## 2023-10-08 DIAGNOSIS — D099 Carcinoma in situ, unspecified: Secondary | ICD-10-CM

## 2023-10-08 HISTORY — DX: Carcinoma in situ, unspecified: D09.9

## 2023-10-08 MED ORDER — DOXYCYCLINE MONOHYDRATE 100 MG PO CAPS: 100.0000 mg | ORAL_CAPSULE | Freq: Two times a day (BID) | ORAL | 0 refills | Status: AC

## 2023-10-08 MED ORDER — MUPIROCIN 2 % EX OINT: 1.0000 | TOPICAL_OINTMENT | Freq: Every day | CUTANEOUS | 0 refills | Status: AC

## 2023-10-08 NOTE — Progress Notes (Addendum)
Follow-Up Visit   Subjective  Dean Ryan is a 82 y.o. male who presents for the following: Excision of bx proven SCC at right forearm  The following portions of the chart were reviewed this encounter and updated as appropriate: medications, allergies, medical history  Review of Systems:  No other skin or systemic complaints except as noted in HPI or Assessment and Plan.  Objective  Well appearing patient in no apparent distress; mood and affect are within normal limits.  A focused examination was performed of the following areas: Right arm Relevant physical exam findings are noted in the Assessment and Plan.   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  Skin excision  Total excision diameter (cm):  1.6 Informed consent: discussed and consent obtained   Timeout: patient name, date of birth, surgical site, and procedure verified   Procedure prep:  Patient was prepped and draped in usual sterile fashion Prep type:  Chlorhexidine Anesthesia: the lesion was anesthetized in a standard fashion   Anesthetic:  1% lidocaine w/ epinephrine 1-100,000 buffered w/ 8.4% NaHCO3 (6 cc lido w/epi, 3.5 cc bupivicaine) Instrument used: #15 blade   Hemostasis achieved with: pressure   Outcome: patient tolerated procedure well with no complications   Additional details:  Tagged lateral  Skin repair Complexity:  Intermediate Final length (cm):  5.8 Informed consent: discussed and consent obtained   Timeout: patient name, date of birth, surgical site, and procedure verified   Procedure prep:  Patient was prepped and draped in usual sterile fashion Prep type:  Chlorhexidine Anesthesia: the lesion was anesthetized in a standard fashion   Anesthetic:  1% lidocaine w/ epinephrine 1-100,000 buffered w/ 8.4% NaHCO3 Reason for type of repair: reduce  tension to allow closure, reduce the risk of dehiscence, infection, and necrosis, reduce subcutaneous dead space and avoid a hematoma, allow closure of the large defect and preserve normal anatomy   Undermining: area extensively undermined   Subcutaneous layers (deep stitches):  Suture size:  4-0 Suture type: Monocryl (poliglecaprone 25)   Stitches:  Buried vertical mattress Fine/surface layer approximation (top stitches):  Suture size:  5-0 Suture type: Prolene (polypropylene)   Stitches: simple running   Suture removal (days):  14 Hemostasis achieved with: suture, pressure and electrodesiccation Outcome: patient tolerated procedure well with no complications   Post-procedure details: sterile dressing applied and wound care instructions given   Dressing type: petrolatum, bandage and pressure dressing   Additional details:  Unable to close central centimeter of wound due to thin atrophic skin (suture and needle tearing through) and tension. Left to heal by second intention while 90% of wound was sutured and will heal by primary intention  Specimen 1 - Surgical pathology Differential Diagnosis: BX proven SCC  Check Margins: yes Healing bx site [phone] Tagged lateral  Neoplasm of uncertain behavior Left Hand - Dorsal  Skin / nail biopsy Type of biopsy: tangential   Informed consent: discussed and consent obtained   Timeout: patient name, date of birth, surgical site, and procedure verified   Procedure prep:  Patient was prepped and draped in usual sterile fashion Prep type:  Isopropyl alcohol Anesthesia: the lesion was anesthetized in a standard fashion   Anesthetic:  1% lidocaine w/ epinephrine 1-100,000 buffered w/ 8.4% NaHCO3 Instrument used: DermaBlade   Hemostasis achieved with: pressure and aluminum  chloride   Outcome: patient tolerated procedure well   Post-procedure details: sterile dressing applied and wound care instructions given   Dressing type: bandage and  petrolatum    Specimen 2 - Surgical pathology Differential Diagnosis: R/O SCC  Check Margins: No 7 mm indurated pink keratotic papule     Return in about 1 week (around 10/15/2023) for wound check, 14 day suture removal.  Anise Salvo, RMA, am acting as scribe for Elie Goody, MD .   Documentation: I have reviewed the above documentation for accuracy and completeness, and I agree with the above.  Elie Goody, MD

## 2023-10-08 NOTE — Patient Instructions (Signed)

## 2023-10-09 ENCOUNTER — Telehealth: Payer: Self-pay

## 2023-10-09 NOTE — Telephone Encounter (Signed)
Patient doing fine following yesterday's surgery, no concerns.  Butch Penny., RMA

## 2023-10-14 LAB — SURGICAL PATHOLOGY

## 2023-10-15 ENCOUNTER — Ambulatory Visit: Payer: Medicare Other | Admitting: Dermatology

## 2023-10-15 ENCOUNTER — Encounter: Payer: Self-pay | Admitting: Dermatology

## 2023-10-15 DIAGNOSIS — D0462 Carcinoma in situ of skin of left upper limb, including shoulder: Secondary | ICD-10-CM | POA: Diagnosis not present

## 2023-10-15 DIAGNOSIS — D099 Carcinoma in situ, unspecified: Secondary | ICD-10-CM

## 2023-10-15 DIAGNOSIS — Z5189 Encounter for other specified aftercare: Secondary | ICD-10-CM

## 2023-10-15 DIAGNOSIS — Z4802 Encounter for removal of sutures: Secondary | ICD-10-CM

## 2023-10-15 NOTE — Progress Notes (Signed)
   Follow-Up Visit   Subjective  Dean Ryan is a 82 y.o. male who presents for the following: 1 week wound check at right forearm.   The patient has spots, moles and lesions to be evaluated, some may be new or changing and the patient may have concern these could be cancer.   The following portions of the chart were reviewed this encounter and updated as appropriate: medications, allergies, medical history  Review of Systems:  No other skin or systemic complaints except as noted in HPI or Assessment and Plan.  Objective  Well appearing patient in no apparent distress; mood and affect are within normal limits.   A focused examination was performed of the following areas: Left hand, right arm  Relevant exam findings are noted in the Assessment and Plan.  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  Destruction of lesion  Destruction method: electrodesiccation and curettage   Informed consent: discussed and consent obtained   Timeout:  patient name, date of birth, surgical site, and procedure verified Anesthesia: the lesion was anesthetized in a standard fashion   Anesthetic:  1% lidocaine w/ epinephrine 1-100,000 buffered w/ 8.4% NaHCO3 Curettage performed in three different directions: Yes   Electrodesiccation performed over the curetted area: Yes   Curettage cycles:  1 Lesion length (cm):  8 Margin per side (cm):  2 Final wound size (cm):  1.2 Hemostasis achieved with:  electrodesiccation Outcome: patient tolerated procedure well with no complications   Post-procedure details: sterile dressing applied and wound care instructions given   Dressing type: petrolatum    Encounter for removal of sutures  Visit for wound check   Encounter for Removal of Sutures - Incision site at the right forearm is clean, dry and intact - Wound cleansed, sutures removed, wound cleansed and steri strips applied.   - Discussed pathology results showing margins free  - Patient advised to keep steri-strips dry until they fall off. - Scars remodel for a full year. - Once steri-strips fall off, patient can apply over-the-counter silicone scar cream each night to help with scar remodeling if desired. - Patient advised to call with any concerns or if they notice any new or changing lesions.     Return for as scheduled.  Anise Salvo, RMA, am acting as scribe for Elie Goody, MD .   Documentation: I have reviewed the above documentation for accuracy and completeness, and I agree with the above.  Elie Goody, MD

## 2023-10-15 NOTE — Patient Instructions (Signed)

## 2023-10-16 ENCOUNTER — Ambulatory Visit: Admit: 2023-10-16 | Discharge: 2023-10-17 | Payer: MEDICARE

## 2023-10-16 DIAGNOSIS — C220 Liver cell carcinoma: Principal | ICD-10-CM

## 2023-10-16 DIAGNOSIS — K7460 Unspecified cirrhosis of liver: Principal | ICD-10-CM

## 2023-10-16 DIAGNOSIS — R188 Other ascites: Principal | ICD-10-CM

## 2023-10-16 DIAGNOSIS — G9340 Encephalopathy, unspecified: Principal | ICD-10-CM

## 2023-10-16 MED ORDER — RIFAXIMIN 550 MG TABLET
ORAL_TABLET | Freq: Two times a day (BID) | ORAL | 3 refills | 90 days | Status: CP
Start: 2023-10-16 — End: 2024-10-10

## 2023-10-16 NOTE — Unmapped (Addendum)
It was great seeing you again! Next steps  -Continue current medications  -Will be following MRI results in December  -Follow-up in 6 months

## 2023-10-16 NOTE — Unmapped (Signed)
[facility]  10/16/2023    Reason for visit: Follow up for MASLD cirrhosis, HCC    Assessment/Plan:    82 y.o. 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     MASLD Cirrhosis, compensated: newly-diagnosed 10/2022, had volume overload but thought to be anasarca rather than true ascites in setting of TPN, long-standing history of steatotic liver disease, risk factors include metabolic syndrome. Also with new likely early-stage HCC diagnosis 11/2022 s/p TACE 09/26/23. MELD has been relatively preserved.  - MELD labs q3-6 months  Ascites/volume: developed significant anasarca in 2023 (no significant ascites) in setting of malnutrition, TPN requirements. No prior paracentesis.   - Reinforced <2g Na diet   - Continue spironolactone 50mg  daily  Varices: no prior EGD, not indicated at this time. Normal platelets without imaging signs of portal hypertension.  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 Cannot tolerate lactulose due to baseline heavy ostomy output. Discussed benefits of continuing rifaximin as more of a preventative measure without significant side effects/harm, he is amenable to continue taking it.  - Continue rifaximin  HCC: MRI 11/2022 with 1.5cm LR-5 lesion in segment 5. We opted not to treat initially as he was quite ill/malnourished from his Crohn's and requiring TPN; however he has made improvement in the past 6 months in this regard. His MELD is 8, he is Child-Pugh A6, and albumin is 3.5, and nearly fully functional. Evaluated by surgical team who felt risks outweighed benefits, ultimately got TACE with IR 09/26/23.  - Follow-up MRI scheduled 11/13/2023    Return in about 6 months (around 04/14/2024). Would like appointments at [facility].    Subjective   History of Present Illness   Accompanied by: N/A (unaccompanied)    82 y.o. 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   Last visit: 11/28/22  Doing well today, feeling tired, denies abdominal pain, ascites, jaundice, confusion   Met with surgeons for [facility] resection, felt risks outweight benefits of surgery and recommended TACE with IR, which he received in 09/26/23. Had some fatigue after it but overall doing ok.  Remains off TPN  He continues to have symptoms of mechanical SBOs that he manages conservatively at home, 3-4 episodes since last visit but never went to hospital  Mindful of <2g Na a day, but this week has been a little relaxed about it  Weight stable, good appetite  Wondering if can stop rifaxmin    Objective   Physical Exam   Vital Signs: BP 155/67 (BP Site: L Arm)  - Pulse 71  - Wt 73.4 kg (161 lb 12.8 oz)  - BMI 26.12 kg/m??   Constitutional: He is in no apparent distress  Eyes: Anicteric sclerae  Cardiovascular: Trace bilateral ankle edema   Gastrointestinal: Soft, nontender abdomen   Neurologic: Awake, alert, and oriented to person, place, and time with normal speech     Lab Results   Component Value Date    Total Bilirubin 0.9 09/26/2023    Total Bilirubin 0.5 06/09/2023    AST 56 (H) 09/26/2023    AST 53 (H) 06/09/2023    ALT 54 (H) 09/26/2023    ALT 44 06/09/2023    Alkaline Phosphatase 202 (H) 09/26/2023    Alkaline Phosphatase 239 (H) 06/09/2023     CT A/P 05/16/23: multiple dilated loops of small bowel containing fecalized contents and suspected transition point, worrisome for a mechanical  SBO; persistent but improving enterocutaneous fistula  MRI Abdomen 01/20/23: cirrhosis, st able segment V LR-5 lesion (1.5cm); mild increase in size of segment VII LR-3 lesion measuring 1.2cm    MELD 3.0: 9 at 09/26/2023  9:40 AM  MELD-Na: 9 at 09/26/2023  9:40 AM  Calculated from:  Serum Creatinine: 1.19 mg/dL at 09/81/1914  7:82 AM  Serum Sodium: 141 mmol/L (Using max of 137 mmol/L) at 09/26/2023  9:40 AM  Total Bilirubin: 0.9 mg/dL (Using min of 1 mg/dL) at 95/62/1308  6:57 AM  Serum Albumin: 3.1 g/dL at 84/69/6295  2:84 AM  INR(ratio): 1.08 at 09/26/2023  9:40 AM  Age at listing (hypothetical): 81 years  Sex: Male at 09/26/2023  9:40 AM

## 2023-10-22 ENCOUNTER — Encounter: Payer: Medicare Other | Admitting: Dermatology

## 2023-10-31 NOTE — Unmapped (Signed)
Patient's daughter called in  today requesting Thomas Rosales help in completing [employer] paper that is expiring.     The daughter will upload form paper work completed a year ago by Thomas Rosales as well as the new form via her fathers's mychart account . She will have this message completed no later then this coming Monday 11/03/2023.      The daughter Waynetta Sandy best call back number is [phone]

## 2023-11-13 ENCOUNTER — Ambulatory Visit: Admit: 2023-11-13 | Discharge: 2023-11-13 | Payer: MEDICARE

## 2023-11-13 ENCOUNTER — Ambulatory Visit
Admit: 2023-11-13 | Discharge: 2023-11-13 | Payer: MEDICARE | Attending: Student in an Organized Health Care Education/Training Program | Primary: Student in an Organized Health Care Education/Training Program

## 2023-11-13 DIAGNOSIS — C220 Liver cell carcinoma: Principal | ICD-10-CM

## 2023-11-13 LAB — COMPREHENSIVE METABOLIC PANEL
ALBUMIN: 3.1 g/dL — ABNORMAL LOW (ref 3.4–5.0)
ALKALINE PHOSPHATASE: 197 U/L — ABNORMAL HIGH (ref 46–116)
ALT (SGPT): 50 U/L — ABNORMAL HIGH (ref 10–49)
ANION GAP: 8 mmol/L (ref 5–14)
AST (SGOT): 51 U/L — ABNORMAL HIGH (ref <=34)
BILIRUBIN TOTAL: 0.8 mg/dL (ref 0.3–1.2)
BLOOD UREA NITROGEN: 28 mg/dL — ABNORMAL HIGH (ref 9–23)
BUN / CREAT RATIO: 23
CALCIUM: 9.2 mg/dL (ref 8.7–10.4)
CHLORIDE: 111 mmol/L — ABNORMAL HIGH (ref 98–107)
CO2: 20 mmol/L (ref 20.0–31.0)
CREATININE: 1.22 mg/dL — ABNORMAL HIGH (ref 0.73–1.18)
EGFR CKD-EPI (2021) MALE: 60 mL/min/{1.73_m2} (ref >=60)
GLUCOSE RANDOM: 177 mg/dL (ref 70–179)
POTASSIUM: 4.5 mmol/L (ref 3.5–5.1)
PROTEIN TOTAL: 7.4 g/dL (ref 5.7–8.2)
SODIUM: 139 mmol/L (ref 135–145)

## 2023-11-13 LAB — AFP TUMOR MARKER: AFP-TUMOR MARKER: 12 ng/mL — ABNORMAL HIGH (ref <=8)

## 2023-11-13 LAB — CBC
HEMATOCRIT: 42.1 % (ref 39.0–48.0)
HEMOGLOBIN: 14.2 g/dL (ref 12.9–16.5)
MEAN CORPUSCULAR HEMOGLOBIN CONC: 33.8 g/dL (ref 32.0–36.0)
MEAN CORPUSCULAR HEMOGLOBIN: 32.8 pg — ABNORMAL HIGH (ref 25.9–32.4)
MEAN CORPUSCULAR VOLUME: 97.1 fL — ABNORMAL HIGH (ref 77.6–95.7)
MEAN PLATELET VOLUME: 9.6 fL (ref 6.8–10.7)
PLATELET COUNT: 197 10*9/L (ref 150–450)
RED BLOOD CELL COUNT: 4.34 10*12/L (ref 4.26–5.60)
RED CELL DISTRIBUTION WIDTH: 14.4 % (ref 12.2–15.2)
WBC ADJUSTED: 10.5 10*9/L (ref 3.6–11.2)

## 2023-11-13 LAB — PROTIME-INR
INR: 1.13
PROTIME: 12.9 s — ABNORMAL HIGH (ref 9.9–12.6)

## 2023-11-13 MED ADMIN — gadopiclenol (ELUCIREM,VUEWAY) injection 7.34 mL: .1 mL/kg | INTRAVENOUS | @ 16:00:00 | Stop: 2023-11-13

## 2023-11-13 NOTE — Unmapped (Signed)
Patient in clinic today for F/U with  VIR provider MD/APP following:     Pre-Op Diagnosis:     Procedure Name:     Procedure completed in [facility] VIR .   VSS. Alert and Oriented x4. Denies pain on initial assessment/exam by RN. Allergies and home medications reviewed and documented. RN provided education to patient. No further questions or concerns at this time. RN offered clinic number for patient contact. AVS offered.  \

## 2023-11-13 NOTE — Unmapped (Signed)
Patient Name: Thomas Rosales  Patient Age: 82 y.o.  Encounter Date: 11/16/23   Attending Interventional Radiologist: Dr. Veronia Beets  Resident Interventional Radiologist: Rosalio Loud, MD    Referring Physician: Cory Roughen, PA  [address] [place]. [place],  [place] [address]  Primary Care Provider: Cory Roughen, PA      SUBJECTIVE      Reason for Visit: [facility]        History of Present Illness: Thomas Rosales is a 82 y.o. male who is seen for [facility] Deb-TACE Follow Up - 6 weeks post procedure.     Patient report no acute complaints. He did not feel any discomforts after the procedure besides feeling a little tired for a few days. Otherwise, felt completely normal/fine. Wife in room.       Number and Complexity of Problems Addressed: 1 or more chronic illnesses with exacerbation, progression, or side effects of treatment      Past Medical History:  Past Medical History:   Diagnosis Date    Anal fissure 1983    Apnea, sleep     uses CPAP at night. instructed to bring DOS.     Basal cell carcinoma     Cancer (CMS-HCC)     skin cancer    CHF (congestive heart failure) (CMS-HCC)     Cirrhosis (CMS-HCC) 04/15/2023    Crohn's disease (CMS-HCC)     Diverticulitis of colon     Fatty liver     Gallstones     GERD (gastroesophageal reflux disease)     H/O echocardiogram     Hypertension     Irritable bowel syndrome     Kidney stone     NASH (nonalcoholic steatohepatitis)     Syncope     dizziness    Type 2 diabetes mellitus, without long-term current use of insulin (CMS-HCC) 10/24/2022       Past Surgical History:  Past Surgical History:   Procedure Laterality Date    APPENDECTOMY      BOWEL RESECTION  1981, 1984, 1995    3 ft small intestine, <1 ft large intestines, cecum removed; 2nd bowel resection anal fistulas removed; 3rd bowel resection most of colon removed, colostomy placed.    CARDIAC CATHETERIZATION      CARDIOVASCULAR STRESS TEST      CHOLECYSTECTOMY      COLON SURGERY      Same as small bowel surgery    COLOSTOMY  1995    EYE SURGERY  1/20 & 4/21    Cataract & vitrious removal    FRACTURE SURGERY  1961    Broken tibia and fibula    HERNIA REPAIR  2019    Done by Dr Day    IR EMBOLIZATION ORGAN ISCHEMIA, TUMORS, INFAR  09/26/2023    IR EMBOLIZATION ORGAN ISCHEMIA, TUMORS, INFAR 09/26/2023 Rosalio Loud, MD IMG VIR [facility]    LAPAROSCOPIC SMALL BOWEL RESECTION      LEG SURGERY Left     pins in shin    LITHOTRIPSY      PR COLONOSCOPY FLX DX W/COLLJ SPEC WHEN PFRMD  03/19/2018    Procedure: COLONOSCOPY WITH POSSIBLE BIOPSY, POSSIBLE POLYPECTOMY.;  Surgeon: Tressie Ellis, MD;  Location: ENDO OR [facility];  Service: Gastroenterology    PR COLSC FLEXIBLE W/TRANSENDOSCOPIC BALLOON DILAT N/A 09/13/2022    Procedure: COLONOSCOPY, FLEXIBLE; WITH DILATION BY BALLOON, 1 OR MORE STRICTURES;  Surgeon: Luanne Bras, MD;  Location:  [facility] GI PROCEDURES [facility];  Service: Gastroenterology    PR INCISION & DRAINAGE ABSCESS SIMPLE/SINGLE N/A 08/11/2018    Procedure: INCISION AND DRAINAGE OF ABDOMINAL WALL ABSCESS;  Surgeon: Jerry Caras Day, MD;  Location: OR [place];  Service: General Surgery    PR LAP, INCISIONAL HERNIA REPAIR,REDUCIBLE N/A 06/02/2018    Procedure: LAPAROSCOPIC ASSISTED PARASTOMAL HERNIA REPAIR AND INCISIONAL HERNIA REPAIR WITH MESH;  Surgeon: Jerry Caras Day, MD;  Location: OR [place];  Service: General Surgery    PR VITRECTOMY,MECHANICAL Right 03/28/2020    Procedure: trans pars planar vitrectomy, right eye   ;  Surgeon: Emi Holes, MD;  Location: [facility] OR [place];  Service: Ophthalmology    PR XCAPSL CTRC RMVL INSJ IO LENS PROSTH W/O ECP Right 01/13/2019    Procedure: PHACOEMULSIFICATION OF CATARACT WITH INSERTION OF INTRAOCULAR LENS/TORIC LENS/ORA;  Surgeon: Juanna Cao, MD;  Location: OR [facility];  Service: Ophthalmology    PR XCAPSL CTRC RMVL INSJ IO LENS PROSTH W/O ECP Left 01/27/2019    Procedure: PHACOEMULSIFICATION OF CATARACT WITH INSERTION OF INTRAOCULAR LENS/TORIC LENS/ORA;  Surgeon: Juanna Cao, MD;  Location: OR [facility];  Service: Ophthalmology    RESECTION SMALL BOWEL / CLOSURE ILEOSTOMY  1981    temporary ileostomy     REVISION COLOSTOMY  2009    SMALL INTESTINE SURGERY  3x since 1984    Crohn???s disease    URETERAL STENT PLACEMENT  2008       Family History:  Family History   Problem Relation Age of Onset    Heart disease Mother         pacemaker    Arthritis Mother     Dementia Father     Cancer Father         Allergies:  No Known Allergies     Anticoagulant/Antiplatelet Medications: No anticoagulant medications    OBJECTIVE     Physical Exam:  Vitals:    11/13/23 1120   BP: 133/74   Pulse: 75   Temp: 36.8 ??C (98.3 ??F)   SpO2: 98%     Body mass index is 25.82 kg/m??.    General: alert  Respiratory: breathing comfortably on room air  Cardiovascular: regular rate and rhythm    Point-of-care US performed: Not performed    Pertinent Laboratory Values:  Lab Results   Component Value Date    WBC 10.5 11/13/2023    HGB 14.2 11/13/2023    HCT 42.1 11/13/2023    PLT 197 11/13/2023     Lab Results   Component Value Date    NA 139 11/13/2023    K 4.5 11/13/2023    PHOS 3.0 05/18/2023    MG 1.7 05/18/2023     Lab Results   Component Value Date    BUN 28 (H) 11/13/2023     Lab Results   Component Value Date    ALBUMIN 3.1 (L) 11/13/2023     Lab Results   Component Value Date    BILIDIR 0.70 (H) 04/20/2023     Lab Results   Component Value Date    AST 51 (H) 11/13/2023    ALT 50 (H) 11/13/2023    ALKPHOS 197 (H) 11/13/2023     Lab Results   Component Value Date    APTT 27.7 09/22/2020     No results found for: AFP  No results found for: CA199        11/16/2023, labs were reviewed by me personally. Interpretation: Labs attained during clinic demonstrate  satisfactory liver function and residual AFP. This is indicative of residual disease.     Pertinent Imaging Studies: 11/16/2023, imaging was visualized and reviewed by me personally. Interpretation: MRI 11/13/2023 demonstrates significantly reduced size LR-5 lesion s/p TACE, with areas of wash out that suggest residual disease. As such, will advise repeat TACE of this residual lesion.         Amount and/or Complexity of Data: High : (Must meet at all three listed below)  Review of the result(s) of each unique test ( at least 3 labs CBC, CMP, INR)  Review of prior external note(s) from each unique source  Independent interpretation of a test performed by another physician/other qualified health care professional (not separately reported)    ASA Score: ASA 2 - Patient with mild systemic disease with no functional limitations    Performance Status: 0 = Fully active, able to carry on all pre-disease performance without restriction    ASSESSMENT/PLAN     Jairo Heineman is a 82 y.o. 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 Pt was not a Y90 candidate 2/2 bowel proximity to lesion and H/o Chrons. Pt is not a great surgical candidate 2/2 extensive surgery history and Crohn's disease. Marland Kitchen    MRI and Laboratory findings suggest significantly reduced size of the treated Seg 5 lesion, but still with some residual disease - of which we will pursue TACE of this per mutual decision making.      I discussed the various treatment options available. At this time, we feel that he would benefit most from TACE. The risks, benefits and alternatives were fully discussed including bleeding, infection, incomplete response, non-target embolization, and damage to adjacent structures/organs. The patient's questions were all answered to his satisfaction.     Plan:   -- DebTACE of Seg 5 Lesion w/ Moderate sedation. Procedure will be scheduled at the earliest mutually available date & time  -- Anticoagulant medication hold required: Yes  -- Anticoagulation can be resumed: 24 hours following procedure  -- Antibiotics required: Yes- surgical prophylaxis    -- Discharge medications:Yes- TARE/TACE-Levaquin 500 mg daily x 7 days (antibiotic to prevent post procedure infection), Zofran 4 mg Q8H prn x5 days for nausea, and Oxycodone 5 mg Q6H x 5 days for pain.  --Planned level of sedation: Moderate sedation  --Planned access: left wrist (arterial), Positioning: supine  --Labs needed: Yes- per divisional guidelines, the patient needs new CBC and INR, 30 days before the procedure   --Pre-procedural medications required: Yes- TARE/TACE- Zofran 8 mg IV for nausea, Decadron 8 mg IV for anti-inflammation, Unasyn 3g IV (within one hour of puncture) antibiotics for surgical prophylaxis.   --Pre-US needed: Yes- to assess left radial artery diameter (must be greater or equal to 2 mm for radial access).    -- Spoke with No one.                                              -- Notes reviewed:No   --Follow up- VIR Clinic with MRI in 6 weeks with Dr. Ciro Backer.       Patient seen and discussed with Dr. Veronia Beets, who agrees with the above assessment and plan. Thank you for involving [place] in the care of this patient.    Cedricka Sackrider  Ciro Backer, MD  11/16/23 4:07 PM      Rosalio Loud, MD  Assistant Professor of Radiology  Division of Vascular and Interventional Radiology  [facility]

## 2023-11-17 NOTE — Unmapped (Signed)
After talking with Dr. Laqueta Carina nurse we discovered that this patient has never seen Dr. Lolita Lenz.    This request in our view is Fraudulent. The patient is a different GI provider at a different location.

## 2023-11-17 NOTE — Unmapped (Signed)
Rich from [employer] was calling in to check on the Medical records request for Medicare related the supplies for Ostomy.    I discuss the concern about fraud and request to know who the provider requesting the supplies. He never answered my question. I told him that until we could verify the validity of the Medical Records request we would need the patient signature to release records and to please have the patient call the office to discuss

## 2023-12-11 NOTE — Unmapped (Signed)
 VIR pre call attempt.  LM for call back.

## 2023-12-12 NOTE — Unmapped (Signed)
VIR pre procedure prep call completed. Reviewed to register at 1120 on ground floor of Surgical hospital then proceed [facility].  Informed of no show/late cancellation policy. NPO guidelines reviewed. Pt OK to take sips of clear liquids with all AM meds.  Pt aware of need for driver >82 years of age able to stay throughout procedure and recovery. Made aware of visitation policy and temporary visitor age restrictions.  Pt verbalized understanding. All questions answered.

## 2023-12-16 ENCOUNTER — Inpatient Hospital Stay: Admit: 2023-12-16 | Discharge: 2023-12-17 | Payer: MEDICARE

## 2023-12-16 DIAGNOSIS — C220 Liver cell carcinoma: Principal | ICD-10-CM

## 2023-12-16 LAB — CBC W/ AUTO DIFF
BASOPHILS ABSOLUTE COUNT: 0.1 10*9/L (ref 0.0–0.1)
BASOPHILS RELATIVE PERCENT: 0.6 %
EOSINOPHILS ABSOLUTE COUNT: 0.3 10*9/L (ref 0.0–0.5)
EOSINOPHILS RELATIVE PERCENT: 2.4 %
HEMATOCRIT: 41.6 % (ref 39.0–48.0)
HEMOGLOBIN: 14.5 g/dL (ref 12.9–16.5)
LYMPHOCYTES ABSOLUTE COUNT: 1.1 10*9/L (ref 1.1–3.6)
LYMPHOCYTES RELATIVE PERCENT: 8.8 %
MEAN CORPUSCULAR HEMOGLOBIN CONC: 34.8 g/dL (ref 32.0–36.0)
MEAN CORPUSCULAR HEMOGLOBIN: 32.8 pg — ABNORMAL HIGH (ref 25.9–32.4)
MEAN CORPUSCULAR VOLUME: 94.4 fL (ref 77.6–95.7)
MEAN PLATELET VOLUME: 9.2 fL (ref 6.8–10.7)
MONOCYTES ABSOLUTE COUNT: 1.4 10*9/L — ABNORMAL HIGH (ref 0.3–0.8)
MONOCYTES RELATIVE PERCENT: 11.1 %
NEUTROPHILS ABSOLUTE COUNT: 9.4 10*9/L — ABNORMAL HIGH (ref 1.8–7.8)
NEUTROPHILS RELATIVE PERCENT: 77.1 %
PLATELET COUNT: 174 10*9/L (ref 150–450)
RED BLOOD CELL COUNT: 4.41 10*12/L (ref 4.26–5.60)
RED CELL DISTRIBUTION WIDTH: 15 % (ref 12.2–15.2)
WBC ADJUSTED: 12.2 10*9/L — ABNORMAL HIGH (ref 3.6–11.2)

## 2023-12-16 LAB — COMPREHENSIVE METABOLIC PANEL
ALBUMIN: 3.5 g/dL (ref 3.4–5.0)
ALKALINE PHOSPHATASE: 192 U/L — ABNORMAL HIGH (ref 46–116)
ALT (SGPT): 57 U/L — ABNORMAL HIGH (ref 10–49)
ANION GAP: 13 mmol/L (ref 5–14)
AST (SGOT): 63 U/L — ABNORMAL HIGH (ref <=34)
BILIRUBIN TOTAL: 1.6 mg/dL — ABNORMAL HIGH (ref 0.3–1.2)
BLOOD UREA NITROGEN: 23 mg/dL (ref 9–23)
BUN / CREAT RATIO: 21
CALCIUM: 9.2 mg/dL (ref 8.7–10.4)
CHLORIDE: 106 mmol/L (ref 98–107)
CO2: 22 mmol/L (ref 20.0–31.0)
CREATININE: 1.11 mg/dL (ref 0.73–1.18)
EGFR CKD-EPI (2021) MALE: 67 mL/min/{1.73_m2} (ref >=60)
GLUCOSE RANDOM: 130 mg/dL — ABNORMAL HIGH (ref 70–99)
POTASSIUM: 4.7 mmol/L (ref 3.5–5.1)
PROTEIN TOTAL: 7.6 g/dL (ref 5.7–8.2)
SODIUM: 141 mmol/L (ref 135–145)

## 2023-12-16 LAB — PROTIME-INR
INR: 1.19
PROTIME: 13.6 s — ABNORMAL HIGH (ref 9.9–12.6)

## 2023-12-16 LAB — AFP TUMOR MARKER: AFP-TUMOR MARKER: 25 ng/mL — ABNORMAL HIGH (ref <=8)

## 2023-12-16 MED ORDER — ONDANSETRON HCL 4 MG TABLET
ORAL_TABLET | Freq: Three times a day (TID) | ORAL | 0 refills | 4.00 days | Status: CP | PRN
Start: 2023-12-16 — End: 2023-12-21
  Filled 2023-12-17: qty 10, 4d supply, fill #0

## 2023-12-16 MED ORDER — LEVOFLOXACIN 500 MG TABLET
ORAL_TABLET | Freq: Every day | ORAL | 0 refills | 7.00 days | Status: CP
Start: 2023-12-16 — End: ?
  Filled 2023-12-17: qty 7, 7d supply, fill #0

## 2023-12-16 MED ORDER — OXYCODONE 5 MG TABLET
ORAL_TABLET | Freq: Four times a day (QID) | ORAL | 0 refills | 3.00 days | Status: CP | PRN
Start: 2023-12-16 — End: 2023-12-21
  Filled 2023-12-17: qty 10, 3d supply, fill #0

## 2023-12-16 MED ADMIN — midazolam (VERSED) injection: INTRAVENOUS | @ 22:00:00 | Stop: 2023-12-16

## 2023-12-16 MED ADMIN — midazolam (VERSED) injection: INTRAVENOUS | @ 21:00:00 | Stop: 2023-12-16

## 2023-12-16 MED ADMIN — ondansetron (ZOFRAN) injection 8 mg: 8 mg | INTRAVENOUS | @ 19:00:00 | Stop: 2023-12-16

## 2023-12-16 MED ADMIN — dexAMETHasone (DECADRON) 4 mg/mL injection 8 mg: 8 mg | INTRAVENOUS | @ 19:00:00 | Stop: 2023-12-16

## 2023-12-16 MED ADMIN — nitroglycerin 200 mcg/mL in D5W bolus injection: INTRA_ARTERIAL | @ 20:00:00 | Stop: 2023-12-16

## 2023-12-16 MED ADMIN — sodium chloride (NS) 0.9 % infusion: INTRAVENOUS | @ 20:00:00 | Stop: 2023-12-16

## 2023-12-16 MED ADMIN — ampicillin-sulbactam (UNASYN) injection: INTRAVENOUS | @ 20:00:00 | Stop: 2023-12-16

## 2023-12-16 MED ADMIN — nitroglycerin (NITROSTAT) 2 % ointment 0.5 inch: .5 [in_us] | TOPICAL | @ 19:00:00 | Stop: 2023-12-16

## 2023-12-16 MED ADMIN — heparin (porcine) 1000 unit/mL injection: INTRAMUSCULAR | @ 20:00:00 | Stop: 2023-12-16

## 2023-12-16 MED ADMIN — lidocaine (LMX) 4 % cream: TOPICAL | @ 19:00:00 | Stop: 2023-12-16

## 2023-12-16 MED ADMIN — iohexol (OMNIPAQUE) 300 mg iodine/mL solution 75 mL: 75 mL | INTRA_ARTERIAL | @ 23:00:00 | Stop: 2023-12-16

## 2023-12-16 MED ADMIN — verapamil (ISOPTIN) injection: INTRAVENOUS | @ 20:00:00 | Stop: 2023-12-16

## 2023-12-16 MED ADMIN — fentaNYL (PF) (SUBLIMAZE) injection: INTRAVENOUS | @ 20:00:00 | Stop: 2023-12-16

## 2023-12-16 MED ADMIN — fentaNYL (PF) (SUBLIMAZE) injection: INTRAVENOUS | @ 21:00:00 | Stop: 2023-12-16

## 2023-12-16 MED ADMIN — midazolam (VERSED) injection: INTRAVENOUS | @ 20:00:00 | Stop: 2023-12-16

## 2023-12-16 MED ADMIN — DOXOrubicin (ADRIAMYCIN) 50 mg, LC beads 100-300 micron 1 each, iohexol (OMNIPAQUE) 300 mg iodine/mL 600 mg chemoembolization: 50 mg | INTRA_ARTERIAL | @ 23:00:00 | Stop: 2023-12-16

## 2023-12-16 MED ADMIN — fentaNYL (PF) (SUBLIMAZE) injection: INTRAVENOUS | @ 22:00:00 | Stop: 2023-12-16

## 2023-12-16 NOTE — Unmapped (Signed)
 INTERVENTIONAL RADIOLOGY - Pre Procedure H/P    Assessment/Plan:    Mr. Picciotto is a 83 y.o. male who will undergo TACE in Interventional Radiology.    --This procedure has been fully reviewed with the patient/patient???s authorized representative. The risks, benefits and alternatives have been explained, and the patient/patient???s authorized representative has consented to the procedure.  --The patient will accept blood products in an emergent situation.  --The patient does not have a Do Not Resuscitate order in effect.    HPI: Mr. Greenwell is a 83 y.o. male witrh history of HCC previously treated with segment 5 TACE on 09/26/23 with evidence of viable disease on 11/13/23 MRI who presents for repeat TACE.    Allergies: No Known Allergies    Medications:  No relevant medications, please see full medication list in Epic.    ASA Grade: ASA 2 - Patient with mild systemic disease with no functional limitations    Physical Exam:    Vitals:    12/16/23 1150   BP: 148/61   Pulse: 71   Resp: 15   Temp: 36.7 ??C (98.1 ??F)   SpO2: 98%     General: male in NAD  Airway assessment: Mallampati Class 1 - Can visualize soft palate, fauces, uvula, and tonsillar pillars  Lungs: Respirations nonlabored on room air  Palpable L radial pusle, Tereasa Coop, MD  VIR PGY-6

## 2023-12-17 NOTE — Unmapped (Signed)
[facility] INTERVENTIONAL RADIOLOGY - Operative Note     VIR Post-Procedure Note    Procedure Name:   TACE     Pre-Op Diagnosis:   HCC    Post-Op Diagnosis:   Same as pre-operative diagnosis    VIR Providers    Operator:   Rosalio Loud, MD    Fellow/Resident/APP:   Everrett Coombe, MD     Time out:   Prior to the procedure, a time out was performed with all team members present. During the time out, the patient, procedure and procedure site when applicable were verbally verified by the team members and Rosalio Loud, MD.    Description of procedure:   Successful trans-radial artery TACE of Seg 5 lesion, with DebTACE particles. TR band for hemostasis. No complications.     Sedation:  Moderate sedation    Estimated Blood Loss:   Approximately <2 mL  Complications:   None    Plan:   VIR clinic and Mri in 6 weeks.      See detailed procedure note with images in PACS [facility]).    The patient tolerated the procedure well without incident or complication and left the room in stable condition.    Rosalio Loud, MD  Assistant Professor of Radiology  Division of Vascular and Interventional Radiology  [facility]     12/16/2023 6:04 PM

## 2023-12-17 NOTE — Unmapped (Signed)
S/P TACE with Dr. Ciro Backer, Mr. Denova reports he is feeling tired today but denies other symptoms.  Mrs. Mckellar reports her husband struggles with nausea due to Chrohn's disease, he has ondansetron on hand if needed.     I shared information regarding follow up imaging and a clinic appointment with Dr. Ciro Backer.   Date, time, location, and instructions provided.     Questions answered, contact information provided.

## 2024-01-15 MED ORDER — SPIRONOLACTONE 50 MG TABLET
ORAL_TABLET | Freq: Every day | ORAL | 3 refills | 90.00 days
Start: 2024-01-15 — End: ?

## 2024-01-16 MED ORDER — SPIRONOLACTONE 50 MG TABLET
ORAL_TABLET | Freq: Every day | ORAL | 3 refills | 90.00 days | Status: CP
Start: 2024-01-16 — End: 2025-01-15

## 2024-01-20 NOTE — Unmapped (Signed)
Abstraction Result Flowsheet Data    This patient's last AWV date: [facility] Last Medicare Wellness Visit Date: 04/19/2021  This patients last WCC/CPE date: : Not Found      Reason for Encounter  Reason for Encounter: Outreach  Primary Reason for Outreach: AWV  Text Message: No  MyChart Message: No  Outreach Call Outcome: Patient Will Call Back

## 2024-01-20 NOTE — Unmapped (Signed)
Abstraction Result Flowsheet Data    This patient's last AWV date: [facility] Last Medicare Wellness Visit Date: 04/19/2021  This patients last WCC/CPE date: : Not Found      Reason for Encounter  Reason for Encounter: Outreach  Primary Reason for Outreach: AWV  Text Message: No  MyChart Message: No  Outreach Call Outcome: Left Message

## 2024-01-27 ENCOUNTER — Inpatient Hospital Stay: Admit: 2024-01-27 | Discharge: 2024-01-28 | Payer: MEDICARE

## 2024-01-27 MED ADMIN — gadopiclenol (ELUCIREM,VUEWAY) injection 7.2 mL: 7.2 mL | INTRAVENOUS | @ 16:00:00 | Stop: 2024-01-27

## 2024-02-05 ENCOUNTER — Ambulatory Visit
Admit: 2024-02-05 | Discharge: 2024-02-06 | Payer: MEDICARE | Attending: Student in an Organized Health Care Education/Training Program | Primary: Student in an Organized Health Care Education/Training Program

## 2024-02-05 ENCOUNTER — Ambulatory Visit: Admit: 2024-02-05 | Discharge: 2024-02-06 | Payer: MEDICARE

## 2024-02-05 DIAGNOSIS — C220 Liver cell carcinoma: Principal | ICD-10-CM

## 2024-02-05 DIAGNOSIS — K7581 Nonalcoholic steatohepatitis (NASH): Principal | ICD-10-CM

## 2024-02-05 LAB — COMPREHENSIVE METABOLIC PANEL
ALBUMIN: 3.2 g/dL — ABNORMAL LOW (ref 3.4–5.0)
ALKALINE PHOSPHATASE: 193 U/L — ABNORMAL HIGH (ref 46–116)
ALT (SGPT): 49 U/L (ref 10–49)
ANION GAP: 16 mmol/L — ABNORMAL HIGH (ref 5–14)
AST (SGOT): 59 U/L — ABNORMAL HIGH (ref <=34)
BILIRUBIN TOTAL: 1.1 mg/dL (ref 0.3–1.2)
BLOOD UREA NITROGEN: 31 mg/dL — ABNORMAL HIGH (ref 9–23)
BUN / CREAT RATIO: 28
CALCIUM: 9.3 mg/dL (ref 8.7–10.4)
CHLORIDE: 103 mmol/L (ref 98–107)
CO2: 22 mmol/L (ref 20.0–31.0)
CREATININE: 1.09 mg/dL (ref 0.73–1.18)
EGFR CKD-EPI (2021) MALE: 68 mL/min/{1.73_m2} (ref >=60)
GLUCOSE RANDOM: 152 mg/dL (ref 70–179)
POTASSIUM: 4.4 mmol/L (ref 3.5–5.1)
PROTEIN TOTAL: 7.4 g/dL (ref 5.7–8.2)
SODIUM: 141 mmol/L (ref 135–145)

## 2024-02-05 LAB — CBC W/ AUTO DIFF
BASOPHILS ABSOLUTE COUNT: 0.1 10*9/L (ref 0.0–0.1)
BASOPHILS RELATIVE PERCENT: 1.1 %
EOSINOPHILS ABSOLUTE COUNT: 0.2 10*9/L (ref 0.0–0.5)
EOSINOPHILS RELATIVE PERCENT: 2.2 %
HEMATOCRIT: 41.4 % (ref 39.0–48.0)
HEMOGLOBIN: 14.4 g/dL (ref 12.9–16.5)
LYMPHOCYTES ABSOLUTE COUNT: 1.2 10*9/L (ref 1.1–3.6)
LYMPHOCYTES RELATIVE PERCENT: 10.9 %
MEAN CORPUSCULAR HEMOGLOBIN CONC: 34.7 g/dL (ref 32.0–36.0)
MEAN CORPUSCULAR HEMOGLOBIN: 33.6 pg — ABNORMAL HIGH (ref 25.9–32.4)
MEAN CORPUSCULAR VOLUME: 96.8 fL — ABNORMAL HIGH (ref 77.6–95.7)
MEAN PLATELET VOLUME: 10.7 fL (ref 6.8–10.7)
MONOCYTES ABSOLUTE COUNT: 1.2 10*9/L — ABNORMAL HIGH (ref 0.3–0.8)
MONOCYTES RELATIVE PERCENT: 11.2 %
NEUTROPHILS ABSOLUTE COUNT: 7.9 10*9/L — ABNORMAL HIGH (ref 1.8–7.8)
NEUTROPHILS RELATIVE PERCENT: 74.6 %
PLATELET COUNT: 153 10*9/L (ref 150–450)
RED BLOOD CELL COUNT: 4.28 10*12/L (ref 4.26–5.60)
RED CELL DISTRIBUTION WIDTH: 14.6 % (ref 12.2–15.2)
WBC ADJUSTED: 10.6 10*9/L (ref 3.6–11.2)

## 2024-02-05 LAB — AFP TUMOR MARKER: AFP-TUMOR MARKER: 11 ng/mL — ABNORMAL HIGH (ref <=8)

## 2024-02-05 NOTE — Unmapped (Signed)
 I spoke with [name] today regarding a liver cryoablation with Dr. Ciro Backer.        I spoke with [name] and his daughter, I provided the following instructions to the patient:     Arrival Time:   6:40 am, the procedure will begin at 7:40 am.       You may take your morning medication with sips of water.    Nothing to eat 8 hours prior to the procedure start time.     You may drink clear liquids until two hours prior to the procedure start time.    Clear liquids include water, coffee without cream and sugar, tea, juice and Powerade/Gatorade.     Please do not drink any milk, cream, juice containing pulp, or alcohol.      Please have a family member or friend drive you on the day of the procedure, because you will receive anesthesia.      Allergies:  NKDA     Blood Thinners:  None     Our pre call nurse will contact you 2-3 days prior to the procedure to review the instructions.      The procedure will be performed at:     [facility] [address] [place], [place] [address]     Please check in at the Surgical Services Registration Desk on the day of the procedure.     Patient teaching performed, follow up appointments discussed.    Questions answered, contact information provided.

## 2024-02-05 NOTE — Unmapped (Signed)
 Patient Name: Thomas Rosales  Patient Age: 83 y.o.  Encounter Date: 02/05/24   Attending Interventional Radiologist: Dr. Veronia Beets  Resident Interventional Radiologist: Renato Battles, MD    Referring Physician: Ena Dawley, MD  [address]  [address] [place],  [place] [address]  Primary Care Provider: Cory Roughen, PA      SUBJECTIVE      Reason for Visit: Follow up    History of Present Illness: Thomas Rosales is a 83 y.o. male with past medical history of Crohn's disease, NASH cirrhosis and associated HCC who presents for follow up s/p TACE procedure for segment 5 HCC with VIR on 12/16/23. Follow up MRI from 01/27/24 describes LR-TR viable lesion in segment 5.        Today, patient reports that he is symptomatically doing well.  He had no notable symptoms after prior TACE procedure except for some mild fatigue that was moreso noticed by his family members than himself.    Review of Systems: Pertinent positives and negatives are noted in the HPI above. Review of systems otherwise negative.    Past Medical History:  Past Medical History:   Diagnosis Date    Anal fissure 1983    Apnea, sleep     uses CPAP at night. instructed to bring DOS.     Basal cell carcinoma     Cancer (CMS-HCC)     skin cancer    CHF (congestive heart failure) (CMS-HCC)     Cirrhosis (CMS-HCC) 04/15/2023    Crohn's disease (CMS-HCC)     Diverticulitis of colon     Fatty liver     Gallstones     GERD (gastroesophageal reflux disease)     H/O echocardiogram     Hypertension     Irritable bowel syndrome     Kidney stone     NASH (nonalcoholic steatohepatitis)     Syncope     dizziness    Type 2 diabetes mellitus, without long-term current use of insulin (CMS-HCC) 10/24/2022       Past Surgical History:  Past Surgical History:   Procedure Laterality Date    APPENDECTOMY      BOWEL RESECTION  1981, 1984, 1995    3 ft small intestine, <1 ft large intestines, cecum removed; 2nd bowel resection anal fistulas removed; 3rd bowel resection most of colon removed, colostomy placed.    CARDIAC CATHETERIZATION      CARDIOVASCULAR STRESS TEST      CHOLECYSTECTOMY      COLON SURGERY      Same as small bowel surgery    COLOSTOMY  1995    EYE SURGERY  1/20 & 4/21    Cataract & vitrious removal    FRACTURE SURGERY  1961    Broken tibia and fibula    HERNIA REPAIR  2019    Done by Dr Day    IR EMBOLIZATION ORGAN ISCHEMIA, TUMORS, INFAR  09/26/2023    IR EMBOLIZATION ORGAN ISCHEMIA, TUMORS, INFAR 09/26/2023 Rosalio Loud, MD IMG VIR [facility]    IR EMBOLIZATION ORGAN ISCHEMIA, TUMORS, INFAR  12/16/2023    IR EMBOLIZATION ORGAN ISCHEMIA, TUMORS, INFAR 12/16/2023 Rosalio Loud, MD IMG VIR [facility]    LAPAROSCOPIC SMALL BOWEL RESECTION      LEG SURGERY Left     pins in shin    LITHOTRIPSY      PR COLONOSCOPY FLX DX W/COLLJ SPEC WHEN PFRMD  03/19/2018    Procedure: COLONOSCOPY  WITH POSSIBLE BIOPSY, POSSIBLE POLYPECTOMY.;  Surgeon: Tressie Ellis, MD;  Location: ENDO OR [facility];  Service: Gastroenterology    PR COLSC FLEXIBLE W/TRANSENDOSCOPIC BALLOON DILAT N/A 09/13/2022    Procedure: COLONOSCOPY, FLEXIBLE; WITH DILATION BY BALLOON, 1 OR MORE STRICTURES;  Surgeon: Luanne Bras, MD;  Location: [facility]ED[facility];  Service: Gastroenterology    PR INCISION & DRAINAGE ABSCESS SIMPLE/SINGLE N/A 08/11/2018    Procedure: INCISION AND DRAINAGE OF ABDOMINAL WALL ABSCESS;  Surgeon: Jerry Caras Day, MD;  Location: OR [place];  Service: General Surgery    PR LAP, INCISIONAL HERNIA REPAIR,REDUCIBLE N/A 06/02/2018    Procedure: LAPAROSCOPIC ASSISTED PARASTOMAL HERNIA REPAIR AND INCISIONAL HERNIA REPAIR WITH MESH;  Surgeon: Jerry Caras Day, MD;  Location: OR [place];  Service: General Surgery    PR VITRECTOMY,MECHANICAL Right 03/28/2020    Procedure: trans pars planar vitrectomy, right eye   ;  Surgeon: Emi Holes, MD;  Location: [facility] OR [place];  Service: Ophthalmology    PR XCAPSL CTRC RMVL INSJ IO LENS PROSTH W/O ECP Right 01/13/2019 Procedure: PHACOEMULSIFICATION OF CATARACT WITH INSERTION OF INTRAOCULAR LENS/TORIC LENS/ORA;  Surgeon: Juanna Cao, MD;  Location: OR [facility];  Service: Ophthalmology    PR XCAPSL CTRC RMVL INSJ IO LENS PROSTH W/O ECP Left 01/27/2019    Procedure: PHACOEMULSIFICATION OF CATARACT WITH INSERTION OF INTRAOCULAR LENS/TORIC LENS/ORA;  Surgeon: Juanna Cao, MD;  Location: OR [facility];  Service: Ophthalmology    RESECTION SMALL BOWEL / CLOSURE ILEOSTOMY  1981    temporary ileostomy     REVISION COLOSTOMY  2009    SMALL INTESTINE SURGERY  3x since 1984    Crohn???s disease    URETERAL STENT PLACEMENT  2008       Family History:  Family History   Problem Relation Age of Onset    Heart disease Mother         pacemaker    Arthritis Mother     Dementia Father     Cancer Father         Allergies:  No Known Allergies     Anticoagulant/Antiplatelet Medications: No anticoagulant medications    OBJECTIVE     Physical Exam:  General: alert, in no acute distress  HEENT: normocephalic, atraumatic  Respiratory: breathing comfortably on room air  Skin: no visible rash, no visible lesion  Neurologic: grossly normal, alert & oriented x3    Point-of-care US performed: Not performed    Pertinent Laboratory Values:  Lab Results   Component Value Date    WBC 12.2 (H) 12/16/2023    HGB 14.5 12/16/2023    HCT 41.6 12/16/2023    PLT 174 12/16/2023     Lab Results   Component Value Date    NA 141 12/16/2023    K 4.7 12/16/2023    PHOS 3.0 05/18/2023    MG 1.7 05/18/2023     Lab Results   Component Value Date    BUN 23 12/16/2023    CREATININE 1.11 12/16/2023     Lab Results   Component Value Date    ALBUMIN 3.5 12/16/2023     Lab Results   Component Value Date    BILIDIR 0.70 (H) 04/20/2023     Lab Results   Component Value Date    AST 63 (H) 12/16/2023    ALT 57 (H) 12/16/2023    ALKPHOS 192 (H) 12/16/2023     Lab Results   Component Value Date    INR 1.19 12/16/2023  No results found for: AFP  No results found for: CA199    On 02/05/2024, labs were reviewed by me personally. Interpretation: Elevated liver enzymes consistent with chronic liver disease, mildly elevated WBC of unclear etiology, normal albumin and INR values.  Elevated AFP value of 25 from 12/16/2023; based on labs recommend new CBC, CMP, INR, and AFP today or as soon as feasible to reevaluate baseline liver function as well as AFP tumor marker level to see if there has been a response after TACE procedure.    Pertinent Imaging Studies: On 02/05/2024, imaging was visualized and reviewed by me personally. Interpretation: MRI abdomen 01/27/2024 demonstrating residual lesion in hepatic segment 5 inferiorly with peripheral enhancement that may represent treatment response and/or residual tumor; based on imaging recommend ablation procedure for eradication of the lesion.    ASSESSMENT/PLAN     Jasson Siegmann is a 83 y.o. male s/p DEB TACE procedure with VIR on 12/16/2023 for [facility] lesion in hepatic segment 5.      Patient was previously mapped for potential Y90 treatment, but given proximity of the [facility] lesion to adjacent colon and patient with underlying Crohn's disease and numerous prior bowel fistulas, the decision was made to proceed with TACE.      Patient tolerated TACE procedure well and the tumor is similar to slightly decreased in size from prior MRI, but we favor doing additional treatment in the near future to try to fully eradicate this lesion.      The options of repeat TACE versus cryoablation/microwave ablation/irreversible electroporation were discussed, and because patient stated that he would like to proceed with the most definitive treatment in hopes of fully eradicating the tumor, he decided to proceed with percutaneous cryoablation with general anesthesia. Microwave ablation was felt to be unfavorable 2/2 tumor closeness to liver capsure and adjacent colon.     The patient's questions were all answered to his satisfaction.     Plan:   -- [facility] Cryoablation with GA. Patient advised that scheduling staff will call him to schedule procedure.    - Follow up AFP, CMP, CBC, INR ordered today to be collected as soon as feasible    Patient seen and discussed with Dr. Veronia Beets, who agrees with the above assessment and plan. Thank you for involving [place] in the care of this patient.    This visit was conducted in person.    Renato Battles, MD  02/05/24 8:15 AM

## 2024-02-05 NOTE — Unmapped (Signed)
 Patient in clinic today for F/U with  VIR provider Dr. Ciro Backer, MD following TACe on 12/19/23. MRI completed on 2/12/11/23.    VSS. Alert and Oriented x4. Denies pain on initial assessment/exam by RN. Allergies and home medications reviewed and documented. RN provided education to patient. No further questions or concerns at this time. RN offered clinic number for patient contact. AVS offered.

## 2024-02-06 NOTE — Unmapped (Signed)
 Telephone call to patient to schedule PPS appointment.    Patient in agreement with:    Date: 02/09/24  Time: 1430  Location: [address] [place], [place] [address]

## 2024-02-09 ENCOUNTER — Ambulatory Visit: Admit: 2024-02-09 | Discharge: 2024-02-10 | Payer: MEDICARE

## 2024-02-09 DIAGNOSIS — I10 Essential (primary) hypertension: Principal | ICD-10-CM

## 2024-02-09 DIAGNOSIS — K219 Gastro-esophageal reflux disease without esophagitis: Principal | ICD-10-CM

## 2024-02-09 DIAGNOSIS — Z01818 Encounter for other preprocedural examination: Principal | ICD-10-CM

## 2024-02-09 DIAGNOSIS — K7460 Unspecified cirrhosis of liver: Principal | ICD-10-CM

## 2024-02-09 NOTE — Unmapped (Addendum)
 Per Anesthesia's guidelines:    Please take the following medications the morning of your procedure with a sip of water:    Ondansetron if needed   Xifaxan

## 2024-02-20 NOTE — Unmapped (Signed)
 Abstraction Result Flowsheet Data    This patient's last AWV date: [facility] Last Medicare Wellness Visit Date: 04/19/2021  This patients last WCC/CPE date: : Not Found      Reason for Encounter  Reason for Encounter: Outreach  Primary Reason for Outreach: AWV  Text Message: No  MyChart Message: No  Outreach Call Outcome: Left Message

## 2024-02-26 NOTE — Unmapped (Signed)
 VIR pre procedure prep call completed. Reviewed to register at [phone] on ground floor of [facility] hospital then [facility].  Informed of no show/late cancellation policy. NPO guidelines reviewed. Pt OK to take sips of clear liquids with all AM meds.  Pt aware of need for driver >82 years of age able to stay throughout procedure and recovery. Made aware of visitation policy and temporary visitor age restrictions.  Pt verbalized understanding. All questions answered.

## 2024-03-01 ENCOUNTER — Inpatient Hospital Stay: Admit: 2024-03-01 | Discharge: 2024-03-02 | Payer: MEDICARE

## 2024-03-01 ENCOUNTER — Encounter: Admit: 2024-03-01 | Discharge: 2024-03-02

## 2024-03-01 ENCOUNTER — Ambulatory Visit: Payer: Medicare Other | Admitting: Dermatology

## 2024-03-01 NOTE — Unmapped (Signed)
 Patients cryo rescheduled to Wednesday, March 26th, spoke with daughter with new date and time.  Verbalized understanding.

## 2024-03-03 ENCOUNTER — Inpatient Hospital Stay: Admit: 2024-03-03 | Discharge: 2024-03-04 | Payer: MEDICARE

## 2024-03-03 ENCOUNTER — Ambulatory Visit: Admit: 2024-03-03 | Discharge: 2024-03-04

## 2024-03-03 DIAGNOSIS — D490 Neoplasm of unspecified behavior of digestive system: Principal | ICD-10-CM

## 2024-03-03 DIAGNOSIS — C220 Liver cell carcinoma: Principal | ICD-10-CM

## 2024-03-03 LAB — PROTIME-INR
INR: 1.14
PROTIME: 13 s — ABNORMAL HIGH (ref 9.9–12.6)

## 2024-03-03 LAB — HIGH SENSITIVITY TROPONIN I - SINGLE: HIGH SENSITIVITY TROPONIN I: 11 ng/L (ref <=53)

## 2024-03-03 MED ORDER — LEVOFLOXACIN 500 MG TABLET
ORAL_TABLET | Freq: Every day | ORAL | 0 refills | 7 days | Status: CP
Start: 2024-03-03 — End: 2024-03-10
  Filled 2024-03-03: qty 7, 7d supply, fill #0

## 2024-03-03 MED ORDER — OXYCODONE 5 MG TABLET
ORAL_TABLET | Freq: Four times a day (QID) | ORAL | 0 refills | 3 days | Status: CP | PRN
Start: 2024-03-03 — End: 2024-03-03
  Filled 2024-03-03: qty 10, 3d supply, fill #0

## 2024-03-03 MED ADMIN — EPINEPHrine (ADRENALIN) 0.1 mg/mL injection: INTRAVENOUS | @ 22:00:00 | Stop: 2024-03-03

## 2024-03-03 MED ADMIN — lidocaine (PF) (XYLOCAINE-MPF) 20 mg/mL (2 %) injection: INTRAVENOUS | @ 21:00:00 | Stop: 2024-03-03

## 2024-03-03 MED ADMIN — EPINEPHrine (ADRENALIN) 0.1 mg/mL injection: INTRAVENOUS | @ 21:00:00 | Stop: 2024-03-03

## 2024-03-03 MED ADMIN — phenylephrine 1 mg/10 mL (100 mcg/mL) injection Syrg: INTRAVENOUS | @ 21:00:00 | Stop: 2024-03-03

## 2024-03-03 MED ADMIN — Propofol (DIPRIVAN) injection: INTRAVENOUS | @ 21:00:00 | Stop: 2024-03-03

## 2024-03-03 MED ADMIN — NORepinephrine 8 mg in dextrose 5 % 250 mL (32 mcg/mL) infusion PMB: INTRAVENOUS | @ 21:00:00 | Stop: 2024-03-03

## 2024-03-03 MED ADMIN — lactated Ringers infusion: INTRAVENOUS | @ 21:00:00 | Stop: 2024-03-03

## 2024-03-03 MED ADMIN — iohexol (OMNIPAQUE) 300 mg iodine/mL solution 100 mL: 100 mL | INTRAVENOUS | @ 23:00:00 | Stop: 2024-03-03

## 2024-03-03 MED ADMIN — oxyCODONE (ROXICODONE) immediate release tablet 5 mg: 5 mg | ORAL | Stop: 2024-03-03

## 2024-03-03 MED ADMIN — sugammadex (BRIDION) injection: INTRAVENOUS | @ 23:00:00 | Stop: 2024-03-03

## 2024-03-03 MED ADMIN — ROCuronium (ZEMURON) injection: INTRAVENOUS | @ 22:00:00 | Stop: 2024-03-03

## 2024-03-03 MED ADMIN — phenylephrine 1 mg/10 mL (100 mcg/mL) injection Syrg: INTRAVENOUS | @ 22:00:00 | Stop: 2024-03-03

## 2024-03-03 MED ADMIN — Propofol (DIPRIVAN) injection: INTRAVENOUS | @ 23:00:00 | Stop: 2024-03-03

## 2024-03-03 MED ADMIN — ceFAZolin (ANCEF) injection: INTRAVENOUS | @ 21:00:00 | Stop: 2024-03-03

## 2024-03-03 MED ADMIN — fentaNYL (PF) (SUBLIMAZE) injection: INTRAVENOUS | @ 21:00:00 | Stop: 2024-03-03

## 2024-03-03 MED ADMIN — ROCuronium (ZEMURON) injection: INTRAVENOUS | @ 21:00:00 | Stop: 2024-03-03

## 2024-03-03 MED ADMIN — ondansetron (ZOFRAN) injection: INTRAVENOUS | @ 23:00:00 | Stop: 2024-03-03

## 2024-03-03 MED ADMIN — acetaminophen (OFIRMEV) 10 mg/mL injection: INTRAVENOUS | @ 23:00:00 | Stop: 2024-03-03

## 2024-03-03 NOTE — Unmapped (Signed)
 [place] INTERVENTIONAL RADIOLOGY - Pre Procedure H/P    Assessment/Plan:    Thomas Rosales is a 83 y.o. male who will undergo liver mass cryoablation in Interventional Radiology.    --This procedure has been fully reviewed with the patient/patient???s authorized representative. The risks, benefits and alternatives have been explained, and the patient/patient???s authorized representative has consented to the procedure.  --The patient will accept blood products in an emergent situation.  --The patient does not have a Do Not Resuscitate order in effect.    HPI: Thomas Rosales is a 83 y.o. male with 2.0 cm segment 5 HCC previously treated with DEB-TACE on 09/26/2023 and 12/16/2023. MRI 2/18 showed viable disease and patient presents today for cryoablation.    Allergies: No Known Allergies    Medications:  No relevant medications, please see full medication list in Epic.    Physical Exam:    Vitals:    03/03/24 1228   BP: 149/69   Pulse: 73   Resp: 16   Temp: 36.2 ??C (97.2 ??F)   SpO2: 99%     General: male in NAD  Lungs: Respirations nonlabored on room air    Everrett Coombe, MD  VIR PGY-6

## 2024-03-04 NOTE — Unmapped (Signed)
 VIR post call completed. Patient reports some soreness but otherwise doing well. Instructed to call the number provided at discharge if pain does not improve or worsens or with any other problems or concerns.

## 2024-03-22 NOTE — Unmapped (Addendum)
 Patient LVM on nurse triage line stating he received notice from [employer] that stelara  is not covered.     LVM w/ pharmacist at [facility] in [place] to discuss. Previously, [employer] stated should have a 0$ copay which was discussed originally and must be upheld per [employer] billing/contracting per rep. Awaiting return call.     Pharmacy Rep, Zemira, returned call. Per Zemira, patient co-pay is $0 unless something has changed with insurance. She will call patient to discuss. Her # is [phone].    Ltanya Rummer, RN

## 2024-03-30 ENCOUNTER — Ambulatory Visit: Admit: 2024-03-30 | Discharge: 2024-03-30 | Payer: MEDICARE

## 2024-03-30 DIAGNOSIS — K7460 Unspecified cirrhosis of liver: Principal | ICD-10-CM

## 2024-03-30 DIAGNOSIS — C220 Liver cell carcinoma: Principal | ICD-10-CM

## 2024-03-30 LAB — COMPREHENSIVE METABOLIC PANEL
ALBUMIN: 3.2 g/dL — ABNORMAL LOW (ref 3.4–5.0)
ALKALINE PHOSPHATASE: 216 U/L — ABNORMAL HIGH (ref 46–116)
ALT (SGPT): 41 U/L (ref 10–49)
ANION GAP: 8 mmol/L (ref 5–14)
AST (SGOT): 51 U/L — ABNORMAL HIGH (ref <=34)
BILIRUBIN TOTAL: 0.6 mg/dL (ref 0.3–1.2)
BLOOD UREA NITROGEN: 24 mg/dL — ABNORMAL HIGH (ref 9–23)
BUN / CREAT RATIO: 25
CALCIUM: 9.3 mg/dL (ref 8.7–10.4)
CHLORIDE: 111 mmol/L — ABNORMAL HIGH (ref 98–107)
CO2: 23.9 mmol/L (ref 20.0–31.0)
CREATININE: 0.95 mg/dL (ref 0.73–1.18)
EGFR CKD-EPI (2021) MALE: 80 mL/min/1.73m2 (ref >=60)
GLUCOSE RANDOM: 75 mg/dL (ref 70–179)
POTASSIUM: 4.7 mmol/L (ref 3.4–4.8)
PROTEIN TOTAL: 7.8 g/dL (ref 5.7–8.2)
SODIUM: 143 mmol/L (ref 135–145)

## 2024-03-30 LAB — PROTIME-INR
INR: 1.14
PROTIME: 13 s — ABNORMAL HIGH (ref 9.9–12.6)

## 2024-03-30 NOTE — Unmapped (Signed)
 [facility]  03/30/2024    Reason for visit: Follow up for MASLD cirrhosis, [facility]    Assessment/Plan:    83 y.o. male with Crohn's disease and MASLD cirrhosis c/b HCC (2.7cm in segment 5, s/p TACE x 2 on 09/26/23 and 12/16/23, cryoablation 03/03/24) who presents for follow-up.     MASLD Cirrhosis, compensated: newly-diagnosed 10/2022, had volume overload but thought to be anasarca rather than true ascites in setting of TPN, long-standing history of steatotic liver disease, risk factors include metabolic syndrome. Course has been complicated by [facility] treated with TACE and cryoablation but has remained compensated.   - MELD labs today and q3-6 months  Ascites/volume: developed significant anasarca in 2023 (no significant ascites) in setting of malnutrition, TPN requirements. No prior paracentesis.   - Continue <2g Na diet   - Continue spironolactone  50mg  daily  Varices: no prior EGD, not indicated at this time. Normal platelets without imaging signs of portal hypertension.  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 Cannot tolerate lactulose  due to baseline heavy ostomy output. No signs of encephalopathy today.   - Continue rifaximin   HCC: MRI 11/2022 with 1.5cm LR-5 lesion in segment 5. Follow up MRI in 06/27/23 showed increased size to 2.7 cm and LR-M but review in HB seemed consistent with LR-5 lesion. Evaluated by surgical team for possible lap ablation who felt risks outweighed benefits, ultimately got TACE with IR 09/26/23, with remnant viable disease s/p TACE 12/16/2023, and cryoablation 03/03/2024.   - Repeat AFP today  - Follow-up MRI scheduled 05/04/24 per IR    Return in about 6 months (around 09/29/2024).    Patient seen and plan formulated with attending, Dr. Raynaldo Call.     Terrance Ferretti, MD  Transplant Hepatology Fellow      Subjective   History of Present Illness   Accompanied by: N/A (unaccompanied)    83 y.o. male with Crohn's disease and MASLD cirrhosis c/b HCC (2.7 cm in segment 5, s/p TACE x 2 on 09/26/23 and 12/16/23, cryoablation 03/03/24) who presents for follow-up.   Last visit: 10/16/2023  Since then has had DEB TACE on 12/16/2023 due to LR-TR viable disease in segment 5 lesion. Follow up MRI on 01/27/2024 shows a LR-TR viable lesion. He has now since had cryoablation for the LR-TR viable lesion done on 03/03/2024.   He initially had a lot of pain after the cryoblation, this has started to improve over time. He still feels some tenderness in that area but overall improved.   He feels like he had a viral infection this weekend with abdominal pain/ distension and diarrhea. This has improved now as well.     Objective   Physical Exam   Vital Signs: BP 166/78 (BP Site: L Arm, BP Position: Sitting)  - Pulse 82  - Ht 167.6 cm (5' 6)  - Wt 74.4 kg (164 lb)  - SpO2 98%  - BMI 26.47 kg/m??   Constitutional: He is in no apparent distress  Eyes: Anicteric sclerae  Cardiovascular: Trace bilateral ankle edema   Gastrointestinal: Soft, nontender abdomen, colostomy in place which appears healthy.   Neurologic: Awake, alert, and oriented to person, place, and time with normal speech     Lab Results   Component Value Date    Total Bilirubin 1.1 02/05/2024    Total Bilirubin 0.5 06/09/2023    AST 59 (H) 02/05/2024    AST 53 (H) 06/09/2023    ALT 49 02/05/2024  ALT 44 06/09/2023    Alkaline Phosphatase 193 (H) 02/05/2024    Alkaline Phosphatase 239 (H) 06/09/2023     MRI 01/27/2024: LR-viable nodule in segment 5 post TACE.     MELD 3.0: 11 at 12/16/2023 12:01 PM  MELD-Na: 11 at 12/16/2023 12:01 PM  Calculated from:  Serum Creatinine: 1.11 mg/dL at 0/08/8118 14:78 PM  Serum Sodium: 141 mmol/L (Using max of 137 mmol/L) at 12/16/2023 12:01 PM  Total Bilirubin: 1.6 mg/dL at 01/18/5620 30:86 PM  Serum Albumin : 3.5 g/dL at 04/14/8468 62:95 PM  INR(ratio): 1.19 at 12/16/2023 12:01 PM  Age at listing (hypothetical): 33 years  Sex: Male at 12/16/2023 12:01 PM

## 2024-03-31 LAB — AFP TUMOR MARKER: AFP-TUMOR MARKER: 6 ng/mL (ref <=8)

## 2024-04-02 NOTE — Unmapped (Signed)
 REASON FOR VISIT:  Crohn's disease    HISTORY OF PRESENT ILLNESS:  Since last visit,  10/16/2023, hepatology visit.  Recommend MRI follow-up.  12/16/2023, TACE for [facility].  01/16/2024, VIR visit.  Persistent HCC.  Recommend percutaneous cryoablation.  01/27/2024, MRI abdomen.  Cirrhosis with portal hypertension.  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  Persistent 2 cm HCC in segment 5.  LI-RADS viable. No IBD.  02/05/2024, CBC, CMP normal except MCV 97, BUN 31, albumin  3.2, AST 59, alk phos 193  03/03/2024, cryoablation of persistent HCC in segment 5.  03/30/2024, hepatology visit.  Compensated cirrhosis.  Repeat MRI.  03/30/2024, CMP normal except albumin  3.2, AST 51, alk phos 216    Continues ustekinumab  q 8 weeks. Over the last 6 months, has had 2-3 episodes of mid abd pain that progresses. Associated with nausea and distension absent ostomy output, but not vomiting. Self treats with NPO status, lying flat.  Lasts about 1 day and the self resolves.  He does take one dose of oral morphine  if pain is bad.  In between episodes, he is asymptomatic with the exception of fatigue.  Good appetite. No wt loss.  No drainage from supposed EC fistula at sugical scar in the last 4 months. Taking stelara  q 2 months.    MEDICATIONS:  has a current medication list which includes the following prescription(s): glucose blood, blood-glucose meter, cholecalciferol (vitamin d3-25 mcg (1,000 unit)), epinephrine , magnesium oxide, metoprolol  succinate, multivitamin, ondansetron , oxycodone , pantoprazole , propylene glycol/peg 400, rifaximin , spironolactone , syringe (disposable), stelara , vit c/e/zn/coppr/lutein/zeaxan, and white petrolatum.    ALLERGIES:  Allergies as of 04/05/2024    (No Known Allergies)       PAST MEDICAL HISTORY:  1. ileocolonic Crohn's disease with perianal fistula.  Symptoms started in 1960s.  Diagnosed 1973.  Sulfasalazine and decades of prednisone .  1983.  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  Ileostomy reversal.  1995, left hemicolectomy with end colostomy and Hartman's pouch.  Tapered off prednisone  in mid 90s.  Started infliximab followed by certolizumab both which had primary nonresponse.  Started adalimumab which worked well for 10 years.  Steroid free remission.  2009, recurrent SBO treated conservatively and with prednisone .  Required several small bowel resections during this time.  2018, new onset heart failure (EF 30%) thought to be due to anti-TNF.  Stopped adalimumab with improvement in ejection fraction.  Asymptomatic off therapy x3 years.  2019, parastomal hernia repair and ventral incisional hernia repair with revision of stoma and biodegradable mesh placement.  Postop abscess that resolved.  05/2020, SBO probably due to adhesions treated with lysis of adhesions.  Poor wound healing postoperatively.  06/2020, start ustekinumab  prophylactically to help maintain remission even though no active Crohn's at time of surgery. 12/28/2020, SBO-probably adhesions or hernia, treated with supportive care. 12/03/2021, admitted for SBO.  Treated with supportive care. 05/04/22, hospitalized for SBO.  Treated with supportive care. 09/13/2022, colonoscopy with fibrotic anastomotic stenosis that was balloon dilated. 10/22/2022, enterocutaneous fistula drainage started. 10/23/2022 - 11/07/2022, hospitalized to start TPN.  Complicated by volume overload treated with diuretics. 11/15/2022 - 11/22/2022, hospitalized for edema due to volume overload.  Treated with diuretics.  TPN stopped.  Diagnosed with new HCC and liver cirrhosis.  04/12/2023, 05/15/2023, admitted for SBO likely due to adhesions.  2.  Sleep apnea  3.  Congestive heart failure, associated with anti-TNF (adalimumab). 11/18/2022, TTE.  EF 55%.  Grade 2 diastolic dysfunction.  Moderately dilated left atrium.  Elevated right  atrial pressures consistent with fluid overload (?TPN).  4.  Heartburn  5.  Hypertension  6.  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 09/26/2023, TACE for [facility]. 03/03/2024, cryoablation of persistent HCC.  7.  Kidney stones  8.  Skin cancer  9.  06/2018, PPD negative  10.  03/2017, PPSV23  11.  COVID-19 mRNA vaccines x2   12. Osteoporosis  13. Shingrix vaccine series in 2020  14. Tdap 05/22/2021    SOCIAL HISTORY:  Quit smoking.  Married. Retired [name], [employer].    FAMILY HISTORY:  No IBD.  Father had colon cancer in his 72s.    PHYSICAL EXAM:  BP 136/59 (BP Site: L Arm, BP Position: Sitting, BP Cuff Size: Medium)  - Pulse 72  - Temp 36.4 ??C (97.5 ??F) (Temporal)  - Ht 167.6 cm (5' 6)  - Wt 73 kg (161 lb)  - BMI 25.99 kg/m??   Wt Readings from Last 10 Encounters:   04/05/24 73 kg (161 lb)   03/30/24 74.4 kg (164 lb)   03/03/24 72.6 kg (160 lb)   02/09/24 75.8 kg (167 lb 3.2 oz)   02/05/24 72.6 kg (160 lb)   12/16/23 72.6 kg (160 lb)   01/27/24 72.6 kg (160 lb)   11/13/23 72.6 kg (160 lb)   10/16/23 73.4 kg (161 lb 12.8 oz)   10/06/23 73.5 kg (162 lb)     CONSTITUTIONAL: awake, alert, NAD, appears much healthier than prior.  Abdomen: soft, nontender. left lower quadrant ostomy with pasty yellow stool.  Midline surgical scar appears healed without any draining lesions.    TEST DATA:  1.  03/19/2018, colonoscopy.  Patent end sigmoid colostomy characterized by erythema and friable mucosa that was biopsied.  Patent end-to-end ileocolonic anastomosis characterized by edema, erosions, erythema, friability.  Normal ileum.  No obvious evidence for active Crohn's disease.    2.  09/22/2020, CT abdomen pelvis.  Postsurgical changes of abdominal wall.  No fluid collection.  No fistula.  No signs of active IBD.  Hepatic steatosis.  Spine compression deformities.  Aortic atherosclerosis.  3.  10/31/2020, CT enterography.  Left lower quadrant end colostomy with parastomal hernia.  Status post right hemicolectomy with ileocolonic anastomosis.  1.1 cm descending colon lipoma.  No active bowel inflammation.  4. 12/27/2020, CTAP without contrast.  Status post right hemicolectomy and loop colostomy.  Parastomal hernia containing fat and multiple dilated small bowel loops.  No discrete transition point.  Bowel loops adhered to the anterior abdominal wall where surrounding mesenteric stranding is present.  5. 02/28/2021, QDR. L1-4 T=-0.8, Left femoral neck T= -2.8, Right femoral neck T=-3.1  6. 12/03/2021, CTAP with IV contrast only.  Partial SBO with transition point in left upper quadrant.  Likely due to adhesion.  7. 05/04/22, CTAP w IV contrast. Steatosis. 8mm CBD. 3.8cm SB loops with transition point proximal to ileocolonic anastomosis-suspect adhesions. 9.7cm fluid and gas collection in ventral abdomen between SB loops. ? Fistula.  8. 05/07/2022, CTAP w IV/PO contrast. 1cm pancreatic cyst (stable), small peristomal hernia. Resolution of sbo. Colon diverticula. ? 14.6cm fistula-like collection near anterior abdominal wall.  9. 09/13/2022, colonoscopy to TI x 8 cm.  8 mm diameter by 8 mm long noninflamed stenosis at ICA.  Dilated to 12 mm.  No mucosal disruption.  No active inflammation in the ileum or colon.  10. 11/13/2022, CTE.  Resolution of large abdominal wall fluid collection.  Small bowel adhesions to anterior abdominal wall with gas bubbles.  Parastomal hernia containing small bowel.  Wall enhancement and stranding of colon and duodenum.  Enlarging liver lesions.  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  Several 4.5 cm small bowel loops without clear transition point.  Wall thickening and enhancement in portion of small bowel.  Ascites, body wall edema, scrotal hydrocele suggesting fluid overload.  11. 11/17/2022, MRI abdomen.  1.5 cm liver lesion in segment 5.  LR-5 definite hepatocellular carcinoma.  1.1 cm segment 7 LR-3 indeterminate lesion.  9 mm pancreatic tail cyst.  Diffuse wall edema of stomach and colon.  12. 11/21/2022, HBsAb, HBctotal, HBsAg all neg. HCV Ab pos. HCV RNA not detected.  13. 12/17/2022, CTAP.  4.4 cm dilated small bowel without transition point.  Small bowel adhesions.  Rectal stump tethered to anterior abdominal wall.  Increased size of fluid collection in anterior abdominal wall with new locules of gas.  Parastomal hernia containing small bowel.  1.4 cm liver nodule in segment 5.  Pancreatic tail cyst.  Nonobstructive kidney stones (1.5-1.8 cm)  14. 01/20/2023, cirrhotic liver.  Small volume ascites.  Trace pleural effusion.  Stable LR-5 lesion in segment 5.  Mild increase in LR-3 lesion in segment 7 (1.2cm).  Recommend follow-up in 3-6 months.  Stable 7 mm pancreatic tail cyst.  1.4 cm mesenteric nodule in left abdomen.  Indeterminate.  15. 05/16/2023, CTAP.  Cirrhotic liver.  2.3 cm liver lesion.  1.2 cm pancreatic tail cyst.  Mild biliary ductal dilatation.  Multiple dilated loops of small bowel tethered to the anterior abdominal wall with suspected transition point in mid lower abdomen.  Left lower quadrant colostomy.  Persistent gas locules in anterior abdomen near umbilicus suggestive of improving enterocutaneous fistula.  Moderate sized parastomal hernia containing small bowel loops.  16. 06/27/2023, MRI abdomen.  2.9 cm segment 5 liver lesion concerning for HCC.  Cirrhosis with portal hypertension.  Stable 8 mm pancreatic tail cyst.  Stable indeterminate 1.4 cm mesenteric soft tissue nodule in left abdomen.  Parastomal hernia.  No bowel obstruction.  Trace ascites.  Previously noted enterocutaneous fistula at the umbilicus is poorly seen today due to susceptibility artifact.  17. 01/27/2024, MRI abdomen.  Cirrhosis with portal hypertension.  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  Persistent 2 cm HCC in segment 5.  LI-RADS viable. No IBD.    ASSESSMENT:  1.  Ileocolonic Crohn's disease with history of perianal fistula.  Status post right hemicolectomy with ileocolonic anastomosis and left-sided end colostomy and remnant rectum.  It seems as if he has intermittent partial small bowel obstruction episodes every couple of months that are self-limited.  These are probably due to adhesions given the lack of any bowel dilatation on recent cross-sectional imaging as well as short-lived nature.  Alternatively, the stenotic ileocolonic anastomosis could be playing a role as well.  I doubt that he has active inflammatory Crohn's disease that would benefit from medication adjustments.   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  Currently inactive.  Currently in symptomatic remission on ustekinumab .  Continue ustekinumab  every 8 weeks as before.  Reviewed risks including risks of infection.   2. MASLD cirrhosis with HCC.  No signs of encephalopathy or ascites today.  Does have mild peripheral lower extremity edema which is chronic problem for him.  Continue follow-up with Dr. Raynaldo Call.  3.  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  -Zofran  dissolvable tablets as needed for episodes of nausea.  I sent in rx to your pharmacy.  -Follow up with Dr. Raynaldo Call regarding [facility] and cirrhosis.  -If blockage episode is severe or lasts more than a day, you will need to go to the hospital.  -Clinic visit with me in 6 months or sooner if needed.

## 2024-04-02 NOTE — Unmapped (Addendum)
-  Continue stelara  every 2 months for now  -Flu shot each October  -Get RSV vaccine at local pharmacy or primary care doctor.  -Zofran  dissolvable tablets as needed for episodes of nausea.  I sent in rx to your pharmacy.  -Follow up with Dr. Raynaldo Call regarding [facility] and cirrhosis.  -If blockage episode is severe or lasts more than a day, you will need to go to the hospital.  -Clinic visit with me in 6 months or sooner if needed.

## 2024-04-05 ENCOUNTER — Ambulatory Visit: Admit: 2024-04-05 | Discharge: 2024-04-06 | Payer: MEDICARE | Attending: Gastroenterology | Primary: Gastroenterology

## 2024-04-05 DIAGNOSIS — K50013 Crohn's disease of small intestine with fistula: Principal | ICD-10-CM

## 2024-04-05 DIAGNOSIS — E1122 Type 2 diabetes mellitus with diabetic chronic kidney disease: Principal | ICD-10-CM

## 2024-04-05 DIAGNOSIS — I5032 Chronic diastolic (congestive) heart failure: Principal | ICD-10-CM

## 2024-04-05 DIAGNOSIS — N183 Type 2 diabetes mellitus with stage 3 chronic kidney disease, without long-term current use of insulin, unspecified whether stage 3a or 3b CKD: Principal | ICD-10-CM

## 2024-04-05 DIAGNOSIS — I420 Dilated cardiomyopathy: Principal | ICD-10-CM

## 2024-04-05 DIAGNOSIS — Z933 Colostomy status: Principal | ICD-10-CM

## 2024-04-05 DIAGNOSIS — K5080 Crohn's disease of both small and large intestine without complications: Principal | ICD-10-CM

## 2024-04-05 DIAGNOSIS — C220 Liver cell carcinoma: Principal | ICD-10-CM

## 2024-04-06 ENCOUNTER — Emergency Department

## 2024-04-06 ENCOUNTER — Inpatient Hospital Stay
Admission: EM | Admit: 2024-04-06 | Discharge: 2024-04-08 | DRG: 386 | Disposition: A | Discharge status: Home / Self Care | Attending: Family Medicine | Admitting: Family Medicine

## 2024-04-06 ENCOUNTER — Other Ambulatory Visit: Payer: Self-pay

## 2024-04-06 ENCOUNTER — Observation Stay

## 2024-04-06 DIAGNOSIS — Z9049 Acquired absence of other specified parts of digestive tract: Secondary | ICD-10-CM

## 2024-04-06 DIAGNOSIS — Z8505 Personal history of malignant neoplasm of liver: Secondary | ICD-10-CM | POA: Diagnosis not present

## 2024-04-06 DIAGNOSIS — Z7952 Long term (current) use of systemic steroids: Secondary | ICD-10-CM | POA: Diagnosis not present

## 2024-04-06 DIAGNOSIS — K660 Peritoneal adhesions (postprocedural) (postinfection): Secondary | ICD-10-CM | POA: Diagnosis present

## 2024-04-06 DIAGNOSIS — C220 Liver cell carcinoma: Secondary | ICD-10-CM | POA: Diagnosis present

## 2024-04-06 DIAGNOSIS — R112 Nausea with vomiting, unspecified: Secondary | ICD-10-CM

## 2024-04-06 DIAGNOSIS — K50112 Crohn's disease of large intestine with intestinal obstruction: Principal | ICD-10-CM

## 2024-04-06 DIAGNOSIS — Z86008 Personal history of in-situ neoplasm of other site: Secondary | ICD-10-CM

## 2024-04-06 DIAGNOSIS — E860 Dehydration: Secondary | ICD-10-CM | POA: Diagnosis present

## 2024-04-06 DIAGNOSIS — N179 Acute kidney failure, unspecified: Secondary | ICD-10-CM | POA: Diagnosis not present

## 2024-04-06 DIAGNOSIS — Z87891 Personal history of nicotine dependence: Secondary | ICD-10-CM

## 2024-04-06 DIAGNOSIS — I420 Dilated cardiomyopathy: Secondary | ICD-10-CM | POA: Diagnosis present

## 2024-04-06 DIAGNOSIS — E875 Hyperkalemia: Secondary | ICD-10-CM | POA: Diagnosis present

## 2024-04-06 DIAGNOSIS — K760 Fatty (change of) liver, not elsewhere classified: Secondary | ICD-10-CM | POA: Diagnosis present

## 2024-04-06 DIAGNOSIS — T380X5A Adverse effect of glucocorticoids and synthetic analogues, initial encounter: Secondary | ICD-10-CM

## 2024-04-06 DIAGNOSIS — I5022 Chronic systolic (congestive) heart failure: Secondary | ICD-10-CM | POA: Diagnosis present

## 2024-04-06 DIAGNOSIS — K7460 Unspecified cirrhosis of liver: Secondary | ICD-10-CM | POA: Diagnosis present

## 2024-04-06 DIAGNOSIS — I130 Hypertensive heart and chronic kidney disease with heart failure and stage 1 through stage 4 chronic kidney disease, or unspecified chronic kidney disease: Secondary | ICD-10-CM | POA: Diagnosis present

## 2024-04-06 DIAGNOSIS — R1084 Generalized abdominal pain: Principal | ICD-10-CM

## 2024-04-06 DIAGNOSIS — Z933 Colostomy status: Secondary | ICD-10-CM

## 2024-04-06 DIAGNOSIS — N200 Calculus of kidney: Secondary | ICD-10-CM | POA: Diagnosis present

## 2024-04-06 DIAGNOSIS — Z85828 Personal history of other malignant neoplasm of skin: Secondary | ICD-10-CM

## 2024-04-06 DIAGNOSIS — D84821 Immunodeficiency due to drugs: Secondary | ICD-10-CM | POA: Diagnosis present

## 2024-04-06 DIAGNOSIS — K5730 Diverticulosis of large intestine without perforation or abscess without bleeding: Secondary | ICD-10-CM | POA: Diagnosis present

## 2024-04-06 DIAGNOSIS — I10 Essential (primary) hypertension: Secondary | ICD-10-CM | POA: Diagnosis not present

## 2024-04-06 DIAGNOSIS — I493 Ventricular premature depolarization: Secondary | ICD-10-CM | POA: Diagnosis present

## 2024-04-06 DIAGNOSIS — E872 Acidosis, unspecified: Secondary | ICD-10-CM | POA: Diagnosis present

## 2024-04-06 DIAGNOSIS — R109 Unspecified abdominal pain: Secondary | ICD-10-CM | POA: Diagnosis present

## 2024-04-06 DIAGNOSIS — N1832 Chronic kidney disease, stage 3b: Secondary | ICD-10-CM | POA: Diagnosis present

## 2024-04-06 DIAGNOSIS — D72829 Elevated white blood cell count, unspecified: Secondary | ICD-10-CM

## 2024-04-06 DIAGNOSIS — Z79899 Other long term (current) drug therapy: Secondary | ICD-10-CM

## 2024-04-06 DIAGNOSIS — T50995A Adverse effect of other drugs, medicaments and biological substances, initial encounter: Secondary | ICD-10-CM | POA: Diagnosis present

## 2024-04-06 DIAGNOSIS — K766 Portal hypertension: Secondary | ICD-10-CM | POA: Diagnosis present

## 2024-04-06 DIAGNOSIS — K56609 Unspecified intestinal obstruction, unspecified as to partial versus complete obstruction: Secondary | ICD-10-CM | POA: Diagnosis not present

## 2024-04-06 HISTORY — DX: Crohn's disease, unspecified, without complications: K50.90

## 2024-04-06 HISTORY — DX: Malignant neoplasm of liver, not specified as primary or secondary: C22.9

## 2024-04-06 LAB — COMPREHENSIVE METABOLIC PANEL WITH GFR
ALT: 49 U/L — ABNORMAL HIGH (ref 0–44)
AST: 61 U/L — ABNORMAL HIGH (ref 15–41)
Albumin: 3.9 g/dL (ref 3.5–5.0)
Alkaline Phosphatase: 157 U/L — ABNORMAL HIGH (ref 38–126)
Anion gap: 7 (ref 5–15)
BUN: 26 mg/dL — ABNORMAL HIGH (ref 8–23)
CO2: 19 mmol/L — ABNORMAL LOW (ref 22–32)
Calcium: 10 mg/dL (ref 8.9–10.3)
Chloride: 109 mmol/L (ref 98–111)
Creatinine, Ser: 1.18 mg/dL (ref 0.61–1.24)
GFR, Estimated: >60 mL/min (ref >60)
Glucose, Bld: 156 mg/dL — ABNORMAL HIGH (ref 70–99)
Potassium: 4.6 mmol/L (ref 3.5–5.1)
Sodium: 135 mmol/L (ref 135–145)
Total Bilirubin: 2.2 mg/dL — ABNORMAL HIGH (ref 0.0–1.2)
Total Protein: 9.1 g/dL — ABNORMAL HIGH (ref 6.5–8.1)

## 2024-04-06 LAB — CBC WITH DIFFERENTIAL/PLATELET
Abs Immature Granulocytes: 0.14 10*3/uL — ABNORMAL HIGH (ref 0.00–0.07)
Basophils Absolute: 0.1 10*3/uL (ref 0.0–0.1)
Basophils Relative: 1 %
Eosinophils Absolute: 0 10*3/uL (ref 0.0–0.5)
Eosinophils Relative: 0 %
HCT: 48.6 % (ref 39.0–52.0)
Hemoglobin: 16.7 g/dL (ref 13.0–17.0)
Immature Granulocytes: 1 %
Lymphocytes Relative: 6 %
Lymphs Abs: 1.3 10*3/uL (ref 0.7–4.0)
MCH: 32.4 pg (ref 26.0–34.0)
MCHC: 34.4 g/dL (ref 30.0–36.0)
MCV: 94.2 fL (ref 80.0–100.0)
Monocytes Absolute: 1.2 10*3/uL — ABNORMAL HIGH (ref 0.1–1.0)
Monocytes Relative: 6 %
Neutro Abs: 16.8 10*3/uL — ABNORMAL HIGH (ref 1.7–7.7)
Neutrophils Relative %: 86 %
Platelets: 209 10*3/uL (ref 150–400)
RBC: 5.16 MIL/uL (ref 4.22–5.81)
RDW: 15 % (ref 11.5–15.5)
WBC: 19.5 10*3/uL — ABNORMAL HIGH (ref 4.0–10.5)
nRBC: 0 % (ref 0.0–0.2)

## 2024-04-06 LAB — TROPONIN I (HIGH SENSITIVITY)
Troponin I (High Sensitivity): 11 ng/L (ref <18)
Troponin I (High Sensitivity): 11 ng/L (ref <18)

## 2024-04-06 LAB — AMMONIA: Ammonia: 59 umol/L — ABNORMAL HIGH (ref 9–35)

## 2024-04-06 LAB — LIPASE, BLOOD: Lipase: 62 U/L — ABNORMAL HIGH (ref 11–51)

## 2024-04-06 MED ORDER — RIFAXIMIN 550 MG PO TABS
550.0000 mg | ORAL_TABLET | Freq: Two times a day (BID) | ORAL | Status: DC
  Administered 2024-04-06 – 2024-04-07 (×2): 550 mg
  Filled 2024-04-06 (×3): qty 1

## 2024-04-06 MED ORDER — ENOXAPARIN SODIUM 40 MG/0.4ML IJ SOSY: 40.0000 mg | PREFILLED_SYRINGE | INTRAMUSCULAR | Status: DC

## 2024-04-06 MED ORDER — BUTAMBEN-TETRACAINE-BENZOCAINE 2-2-14 % EX AERO
1.0000 | INHALATION_SPRAY | Freq: Once | CUTANEOUS | Status: AC
  Administered 2024-04-06: 1 via TOPICAL
  Filled 2024-04-06: qty 5

## 2024-04-06 MED ORDER — SODIUM CHLORIDE 0.9 % IV BOLUS
1000.0000 mL | Freq: Once | INTRAVENOUS | Status: AC
  Administered 2024-04-06: 1000 mL via INTRAVENOUS

## 2024-04-06 MED ORDER — RIFAXIMIN 550 MG PO TABS
550.0000 mg | ORAL_TABLET | Freq: Two times a day (BID) | ORAL | Status: DC
  Filled 2024-04-06: qty 1

## 2024-04-06 MED ORDER — PANTOPRAZOLE SODIUM 40 MG IV SOLR
40.0000 mg | Freq: Every day | INTRAVENOUS | Status: DC
  Administered 2024-04-06 – 2024-04-08 (×3): 40 mg via INTRAVENOUS
  Filled 2024-04-06 (×3): qty 10

## 2024-04-06 MED ORDER — ONDANSETRON HCL 4 MG/2ML IJ SOLN
4.0000 mg | Freq: Once | INTRAMUSCULAR | Status: AC
  Administered 2024-04-06: 4 mg via INTRAVENOUS
  Filled 2024-04-06: qty 2

## 2024-04-06 MED ORDER — ONDANSETRON HCL 4 MG/2ML IJ SOLN
4.0000 mg | Freq: Four times a day (QID) | INTRAMUSCULAR | Status: DC | PRN
  Administered 2024-04-06 (×2): 4 mg via INTRAVENOUS
  Filled 2024-04-06 (×3): qty 2

## 2024-04-06 MED ORDER — ONDANSETRON HCL 4 MG/2ML IJ SOLN
4.0000 mg | Freq: Four times a day (QID) | INTRAMUSCULAR | Status: DC | PRN
  Administered 2024-04-06: 4 mg via INTRAVENOUS

## 2024-04-06 MED ORDER — ACETAMINOPHEN 325 MG PO TABS
650.0000 mg | ORAL_TABLET | Freq: Four times a day (QID) | ORAL | Status: DC | PRN
  Filled 2024-04-06: qty 2

## 2024-04-06 MED ORDER — HYDRALAZINE HCL 20 MG/ML IJ SOLN: 5.0000 mg | INTRAMUSCULAR | Status: DC | PRN

## 2024-04-06 MED ORDER — SODIUM CHLORIDE 0.9 % IV SOLN
12.5000 mg | Freq: Four times a day (QID) | INTRAVENOUS | Status: DC | PRN
  Administered 2024-04-07: 12.5 mg via INTRAVENOUS
  Filled 2024-04-06: qty 12.5

## 2024-04-06 MED ORDER — LACTATED RINGERS IV SOLN: INTRAVENOUS | Status: AC

## 2024-04-06 MED ORDER — ACETAMINOPHEN 650 MG RE SUPP: 650.0000 mg | Freq: Four times a day (QID) | RECTAL | Status: DC | PRN

## 2024-04-06 MED ORDER — MORPHINE SULFATE (PF) 4 MG/ML IV SOLN
4.0000 mg | Freq: Once | INTRAVENOUS | Status: AC
  Administered 2024-04-06: 4 mg via INTRAVENOUS
  Filled 2024-04-06: qty 1

## 2024-04-06 MED ORDER — ONDANSETRON HCL 4 MG PO TABS: 4.0000 mg | ORAL_TABLET | Freq: Four times a day (QID) | ORAL | Status: DC | PRN

## 2024-04-06 MED ORDER — IOHEXOL 300 MG/ML  SOLN
100.0000 mL | Freq: Once | INTRAMUSCULAR | Status: AC | PRN
  Administered 2024-04-06: 100 mL via INTRAVENOUS

## 2024-04-06 MED ORDER — HYDROMORPHONE HCL 1 MG/ML IJ SOLN
0.5000 mg | INTRAMUSCULAR | Status: DC | PRN
  Administered 2024-04-06: 1 mg via INTRAVENOUS
  Administered 2024-04-06: 0.5 mg via INTRAVENOUS
  Administered 2024-04-06 – 2024-04-07 (×3): 1 mg via INTRAVENOUS
  Filled 2024-04-06 (×5): qty 1

## 2024-04-06 MED ORDER — ONDANSETRON HCL 4 MG PO TABS: 4.0000 mg | ORAL_TABLET | ORAL | Status: DC | PRN

## 2024-04-06 NOTE — Assessment & Plan Note (Addendum)
 At high risk of infection due to chronic immunosuppressive medication in the treatment of Crohn's

## 2024-04-06 NOTE — Hospital Course (Addendum)
 Dean Ryan is an 83 y.o. 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  Patient was last seen by gastroenterology 4/28 when he reported episodic abdominal pain with nausea, distention, and decreased ostomy output.  His episodes typically resolve after a day of n.p.o. at home.  Patient's episode was not resolving and he developed worsening abdominal pain and nonbloody nonbilious vomiting and thus presented to the ED.  He was also found to have lower extremity swelling beyond his baseline.  He had hypertension to 181/87, leukocytosis of 19, mild metabolic acidosis on CMP, and elevated liver enzymes.  He was admitted and general surgery was consulted.  CT scan revealed multiple dilated loops of small bowel measuring up to 6 cm with fecalized intraluminal contents likely related to stasis with multiple points of obstruction secondary to underlying adhesions.  Patient was started on an NG tube and made NPO.  General surgery was consulted.  Gradually his colostomy output improved.  He was initiated on clear liquid diet 4/30, and slowly advanced on 5/1. Stay was complicated by AKI.  AKI appeared to be provoked by dehydration, patient received small fluid bolus and increased p.o. intake with some resolution.  He will need to follow-up with his primary care physician to recheck kidney function in 1 week to ensure complete resolution.  By afternoon of 5/1 patient was tolerating his diet, had excellent ostomy output, and was back to his physiologic baseline.  We discussed ER return precautions.  Patient is discharging home today.

## 2024-04-06 NOTE — ED Notes (Signed)
 XR at bedside

## 2024-04-06 NOTE — Assessment & Plan Note (Addendum)
 MASLD cirrhosis with portal hypertension Hold rifaximin while n.po

## 2024-04-06 NOTE — Assessment & Plan Note (Signed)
 Likely reactive with small bowel obstruction.

## 2024-04-06 NOTE — Progress Notes (Signed)
 Xifaxan order to be placed in ostomy for absorption until the NG tube is out.  Ammonia level ordered.

## 2024-04-06 NOTE — ED Triage Notes (Signed)
 Patient comes from home by [facility].  His complaint is abdominal pain and vomiting as a result of Crohn's disease flare up.  Patient also has liver cancer.  No medication given in route by EMS.

## 2024-04-06 NOTE — H&P (Signed)
 History and Physical    Patient: Dean Ryan:[id number] DOB: 1940-12-11 DOA: 04/06/2024 DOS: the patient was seen and examined on 04/06/2024 PCP: [name], PA-C  Patient coming from: Home  Chief Complaint:  Chief Complaint  Patient presents with   Abdominal Pain   Emesis    HPI: Dean Ryan is a 83 y.o. 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  Patient last saw his gastroenterologist at [facility] the day prior on 4/28 when he mentioned that he had been having episodic/monthly mid abdominal pain associated with nausea, distention and decreased ostomy output.  The episodes usually resolve after about a day with staying n.p.o. and lying flat. On the night of arrival he developed worsening abdominal pain and nonbloody nonbilious vomiting.  Also had minimal ostomy output.  Denies fever or chills.  Denies chest pain, shortness of breath, lower extremity swelling beyond his baseline ED course and data review BP 181/87 with otherwise normal vitals Labs notable for WBC 19,000 with mild metabolic acidosis on CMP.  Elevated liver enzymes likely in keeping with known liver disease Lipase 62, AST/ALT 61/49 with alk phos 157 and total bilirubin 2.2 (up from 51/41 and 216 at [facility] on  4/22)  EKG, personally reviewed.  Showed sinus at 62 with multiple PVCs  Chest x-ray nonacute CT abdomen and pelvis with contrast showing multiple points of obstruction among other chronic findings as outlined below: IMPRESSION: 1. Surgical changes of ileocolectomy and left lower quadrant diverting loop sigmoid colostomy. 2. Multiple dilated loops of small bowel measuring up to 6 cm with fecalized intraluminal contents likely  related to stasis related to multiple points of obstruction related to underlying adhesions. 3. Cirrhosis with increasing nodular liver contour. 4. Interval development of numerous parenchymal hypodensities throughout the liver which are indeterminate on this examination. While these may represent regenerating nodules, the possibility of metastatic disease or multifocal hepatoma is not excluded. This could be further assessed with dedicated MRI examination once the patient's acute issues have resolved. 5. Renal atrophy in keeping with chronic renal insufficiency. Numerous nonobstructing bilateral renal calculi. No hydronephrosis. 6. Mild sigmoid and descending colonic diverticulosis. 7. Remote T9, T10, and T11 compression deformities.  Patient treated with an NS bolus, Zofran , morphine   Hospitalist consulted for admission.     Past Medical History:  Diagnosis Date   Crohn disease (HCC)    Liver cancer (HCC)    Neoplasm of skin with sebaceous differentiation 09/19/2022   SEBACEOUS NEOPLASM WITH ATYPICAL FEATURES, Patient saw Dr. Hosam Chimes for scheduled Mohs 10/08/22. Per Dr. Chantal Comment note the sebaceous neoplasm had resolved with biopsy and they were to reassess 3 months later.   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 SCC-KA type. [facility] 02/20/2023   Squamous cell carcinoma of skin 09/01/2023   Right forearm. WD SCC. Excision 10/08/23   Past Surgical History:  Procedure Laterality Date   COLOSTOMY  2022   Social History:  reports that he quit smoking about 40 years ago. His  smoking use included cigarettes. He has never used smokeless tobacco. He reports current alcohol use. He reports that he does not  currently use drugs.  No Known Allergies  History reviewed. No pertinent family history.  Prior to Admission medications   Medication Sig Start Date End Date Taking? Authorizing Provider  furosemide  (LASIX ) 20 MG tablet Take 1 tablet (20 mg total) by mouth daily as needed. 05/09/22   Patel, Sona, MD  loratadine  (CLARITIN ) 10 MG tablet Take 10 mg by mouth daily.     [provider]  Magnesium Oxide (MAG-200 PO) Take 1 tablet by mouth daily. 12/27/21   [provider]  metoprolol  succinate (TOPROL -XL) 50 MG 24 hr tablet Take 1 tablet by mouth daily. 10/23/17   [provider]  Multiple Vitamins-Minerals (PRESERVISION AREDS 2 PO) Take 1 tablet by mouth daily.    [provider]  mupirocin  ointment (BACTROBAN ) 2 % Apply 1 Application topically daily. 10/08/23   Harris Liming, MD  pantoprazole  (PROTONIX ) 40 MG tablet Take 1 tablet by mouth daily. 04/08/18   [provider]  spironolactone  (ALDACTONE ) 50 MG tablet Take 1 tablet by mouth daily. 12/30/22   [provider]  Ustekinumab Sherley Distad) 45 MG/0.5ML SOLN 90mg  subcutaneous every 8weeks 12/21/20   [provider]  XIFAXAN 550 MG TABS tablet Take 550 mg by mouth 2 (two) times daily. 11/28/22   [provider]    Physical Exam: Vitals:   04/06/24 0132 04/06/24 0133 04/06/24 0327  BP: (!) 181/87  (!) 144/71  Pulse: 85  73  Resp: 18  20  Temp: 97.8 F (36.6 C)    TempSrc: Oral    SpO2: 98%  96%  Weight:  71.2 kg   Height:  5\' 6"  (1.676 m)    Physical Exam Vitals and nursing note reviewed.  Constitutional:      General: He is not in acute distress. HENT:     Head: Normocephalic and atraumatic.  Cardiovascular:     Rate and Rhythm: Normal rate and regular rhythm.     Heart sounds: Normal heart sounds.  Pulmonary:     Effort: Pulmonary effort is normal.     Breath sounds: Normal breath sounds.  Abdominal:     General: There is distension.     Palpations:  Abdomen is soft.     Tenderness: There is no abdominal tenderness.     Comments: End colostomy with scant output  Neurological:     Mental Status: Mental status is at baseline.     Labs on Admission: I have personally reviewed following labs and imaging studies  CBC: Recent Labs  Lab 04/06/24 0135  WBC 19.5*  NEUTROABS 16.8*  HGB 16.7  HCT 48.6  MCV 94.2  PLT 209   Basic Metabolic Panel: Recent Labs  Lab 04/06/24 0135  NA 135  K 4.6  CL 109  CO2 19*  GLUCOSE 156*  BUN 26*  CREATININE 1.18  CALCIUM 10.0   GFR: Estimated Creatinine Clearance: 44.3 mL/min (by C-G formula based on SCr of 1.18 mg/dL). Liver Function Tests: Recent Labs  Lab 04/06/24 0135  AST 61*  ALT 49*  ALKPHOS 157*  BILITOT 2.2*  PROT 9.1*  ALBUMIN 3.9   Recent Labs  Lab 04/06/24 0135  LIPASE 62*   No results for input(s): "AMMONIA" in the last 168 hours. Coagulation Profile: No results for input(s): "INR", "PROTIME" in the last 168 hours. Cardiac Enzymes: No results for input(s): "CKTOTAL", "CKMB", "CKMBINDEX", "TROPONINI" in the  last 168 hours. BNP (last 3 results) No results for input(s): "PROBNP" in the last 8760 hours. HbA1C: No results for input(s): "HGBA1C" in the last 72 hours. CBG: No results for input(s): "GLUCAP" in the last 168 hours. Lipid Profile: No results for input(s): "CHOL", "HDL", "LDLCALC", "TRIG", "CHOLHDL", "LDLDIRECT" in the last 72 hours. Thyroid Function Tests: No results for input(s): "TSH", "T4TOTAL", "FREET4", "T3FREE", "THYROIDAB" in the last 72 hours. Anemia Panel: No results for input(s): "VITAMINB12", "FOLATE", "FERRITIN", "TIBC", "IRON", "RETICCTPCT" in the last 72 hours. Urine analysis:    Component Value Date/Time   COLORURINE YELLOW (A) 05/04/2022 1504   APPEARANCEUR HAZY (A) 05/04/2022 1504   LABSPEC 1.028 05/04/2022 1504   PHURINE 5.0 05/04/2022 1504   GLUCOSEU NEGATIVE 05/04/2022 1504   HGBUR NEGATIVE 05/04/2022 1504   BILIRUBINUR  NEGATIVE 05/04/2022 1504   KETONESUR NEGATIVE 05/04/2022 1504   PROTEINUR NEGATIVE 05/04/2022 1504   NITRITE NEGATIVE 05/04/2022 1504   LEUKOCYTESUR NEGATIVE 05/04/2022 1504    Radiological Exams on Admission: CT ABDOMEN PELVIS W CONTRAST Result Date: 04/06/2024 CLINICAL DATA:  Acute nonlocalized abdominal pain. Bowel obstruction. Crohn's disease. "Liver cancer" EXAM: CT ABDOMEN AND PELVIS WITH CONTRAST TECHNIQUE: Multidetector CT imaging of the abdomen and pelvis was performed using the standard protocol following bolus administration of intravenous contrast. RADIATION DOSE REDUCTION: This exam was performed according to the departmental dose-optimization program which includes automated exposure control, adjustment of the mA and/or kV according to patient size and/or use of iterative reconstruction technique. CONTRAST:  OMNIPAQUE  IOHEXOL  300 MG/ML  SOLN COMPARISON:  None Available. FINDINGS: Lower chest: No acute abnormality.  Mild cardiomegaly. Hepatobiliary: Cirrhosis with increasing nodular liver contour when compared to prior examination. There is development of numerous parenchymal hypodensities throughout the liver which are indeterminate. Larger heterogeneous hypodensity subcapsular segment 5 and axial image # 28/2 may reflect an area thermal ablation, but is, again, indeterminate. No intrahepatic biliary ductal dilation. Mild extrahepatic biliary ductal dilation appears stable likely represents post cholecystectomy change. Status post cholecystectomy. Pancreas: Unremarkable Spleen: Unremarkable Adrenals/Urinary Tract: The adrenal glands are unremarkable. The kidneys are atrophic bilaterally, right greater than left, similar to prior examination and in keeping with changes end-stage renal disease. Numerous nonobstructing calculi are seen within kidneys bilaterally measuring up to 16-17 mm. No hydronephrosis. No ureteral calculi. Bladder is unremarkable. Stomach/Bowel: Left lower quadrant  diverting loop sigmoid colostomy is identified. Surgical changes of ileocolectomy are noted. The residual small-bowel appears adherent to each other as well as the anterior abdominal wall likely related to a numerous underlying adhesions. There is numerous loops fluid-filled dilated small bowel measuring up to 6 cm and demonstrating both layering fluid is well as fecalized intraluminal contents likely related to stasis suggesting multiple points of obstruction related to underlying adhesions. No free intraperitoneal gas or fluid. Mild sigmoid and descending colonic diverticulosis. Vascular/Lymphatic: Aortic atherosclerosis. No enlarged abdominal or pelvic lymph nodes. Reproductive: Prostate is unremarkable. Other: Small bilateral fat containing inguinal hernias. Musculoskeletal: No acute bone abnormality. No lytic or blastic lesion. Remote compression deformities T9, T10 and are noted. IMPRESSION: 1. Surgical changes of ileocolectomy and left lower quadrant diverting loop sigmoid colostomy. 2. Multiple dilated loops of small bowel measuring up to 6 cm with fecalized intraluminal contents likely related to stasis related to multiple points of obstruction related to underlying adhesions. 3. Cirrhosis with increasing nodular liver contour. 4. Interval development of numerous parenchymal hypodensities throughout the liver which are indeterminate on this examination. While these may represent regenerating nodules, the possibility  of metastatic disease or multifocal hepatoma is not excluded. This could be further assessed with dedicated MRI examination once the patient's acute issues have resolved. 5. Renal atrophy in keeping with chronic renal insufficiency. Numerous nonobstructing bilateral renal calculi. No hydronephrosis. 6. Mild sigmoid and descending colonic diverticulosis. 7. Remote T9, T10, and T11 compression deformities. Aortic Atherosclerosis (ICD10-I70.0). Electronically Signed   By: Worthy Heads M.D.   On:  04/06/2024 02:50   DG Chest Port 1 View Result Date: 04/06/2024 CLINICAL DATA:  Vomiting EXAM: PORTABLE CHEST 1 VIEW COMPARISON:  None Available. FINDINGS: The heart size and mediastinal contours are within normal limits. Both lungs are clear. The visualized skeletal structures are unremarkable. IMPRESSION: No active disease. Electronically Signed   By: Worthy Heads M.D.   On: 04/06/2024 01:57   Data Reviewed for HPI: Relevant notes from primary care and specialist visits, past discharge summaries as available in EHR, including Care Everywhere. Prior diagnostic testing as pertinent to current admission diagnoses Updated medications and problem lists for reconciliation ED course, including vitals, labs, imaging, treatment and response to treatment Triage notes, nursing and pharmacy notes and ED provider's notes Notable results as noted above in HPI      Assessment and Plan: * Small bowel obstruction, recurrent (HCC) Crohn's colitis with intestinal obstruction S/p hemicolectomy with ileocolonic anastomosis and left-sided end colostomy Continue NG tube to low intermittent suction Pain control, antiemetics, IV Protonix  And IV hydration with caution given history of HFrEF Surgery consulted- Dr Cornel Diesel Consider GI consult Hold immunosuppressive agents SCD for DVT prophylaxis in case of procedure  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  Essential hypertension Hydralazine as needed while n.p.o.     DVT prophylaxis: SCD  Consults: surgery, Dr Cornel Diesel  Advance Care Planning:   Code Status: Prior   Family  Communication: none  Disposition Plan: Back to previous home environment  Severity of Illness: The appropriate patient status for this patient is OBSERVATION. Observation status is judged to be reasonable and necessary in order to provide the required intensity of service to ensure the patient's safety. The patient's presenting symptoms, physical exam findings, and initial radiographic and laboratory data in the context of their medical condition is felt to place them at decreased risk for further clinical deterioration. Furthermore, it is anticipated that the patient will be medically stable for discharge from the hospital within 2 midnights of admission.   Author: Lanetta Pion, MD 04/06/2024 4:21 AM  For on call review [email or web address].

## 2024-04-06 NOTE — Assessment & Plan Note (Addendum)
 Holding metoprolol  and spironolactone .  Looks like an echocardiogram in 2023 shows an EF of 55%.

## 2024-04-06 NOTE — Consult Note (Addendum)
 [facility] SURGICAL CONSULTATION NOTE (initial) - cpt: 99254   HISTORY OF PRESENT ILLNESS (HPI):  83 y.o. male presented to [facility] ED yesterday for evaluation of abdominal pain. Patient reports around a 24 hour history of abdominal pain, distension, nausea, and progressive emesis at home. Patient with a history of numerous SBO in the past and this is similar to these episodes. He is able to control these at home from time to time, but this has been more severe. He has an extensive surgical history secondary to his Crohn's history, which I will try to outline below, chart reviewed in Care Everywhere...   - 1983: Developed EC fistula, small bowel resection, end ileostomy and reversal  - 1995: Left hemicolectomy, end colostomy creation  - 2009-18: Several SBOs; small bowel resections  - 2019: Parastomal hernia repair, ventral hernia repair, revision of colostomy  - 2021: SBO, LOA  Since that time, has has had issues with recurrent SBO and also with EC fistula in 2023 that require admission, TPN. Unfortunately, he also has a history of HCC treated with DEB-TACE on 09/26/2023 and 12/16/2023 as well as cryoablation on 03/26. He is currently on Stelara for his Crohns. Work up in the ED revealed a leukocytosis to 19.5K, Hgb to 16.7, sCr - 1.18, no electrolyte derangements, chronic LFT changes. CT Abdomen/Pelvis was obtained and concerning for SBO. NGT was place; no output measured but there is about 800 ccs in canister. He has had no significant colostomy output.   Surgery is consulted by hospitalist physician Dr. Brion Cancel, MD in this context for evaluation and management of SBO.  PAST MEDICAL HISTORY (PMH):  Past Medical History:  Diagnosis Date   Crohn disease (HCC)    Liver cancer (HCC)    Neoplasm of skin with sebaceous differentiation 09/19/2022   SEBACEOUS NEOPLASM WITH ATYPICAL FEATURES, Patient saw Dr. Ranen Chimes for scheduled Mohs 10/08/22. Per Dr. Chantal Comment note the sebaceous  neoplasm had resolved with biopsy and they were to reassess 3 months later.   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 SCC-KA type. EDC 02/20/2023   Squamous cell carcinoma of skin 09/01/2023   Right forearm. WD SCC. Excision 10/08/23     PAST SURGICAL HISTORY [facility]):  Past Surgical History:  Procedure Laterality Date   COLOSTOMY  2022     MEDICATIONS:  Prior to Admission medications   Medication Sig Start Date End Date Taking? Authorizing Provider  furosemide  (LASIX ) 20 MG tablet Take 1 tablet (20 mg total) by mouth daily as needed. 05/09/22   Patel, Sona, MD  loratadine  (CLARITIN ) 10 MG tablet Take 10 mg by mouth daily.     [provider]  Magnesium Oxide (MAG-200 PO) Take 1 tablet by mouth daily. 12/27/21   [provider]  metoprolol  succinate (TOPROL -XL) 50 MG 24 hr tablet Take 1 tablet by mouth daily. 10/23/17   [provider]  Multiple Vitamins-Minerals (PRESERVISION AREDS 2 PO) Take 1 tablet by mouth daily.    [provider]  mupirocin  ointment (BACTROBAN ) 2 % Apply 1 Application topically daily. 10/08/23   Harris Liming, MD  pantoprazole  (PROTONIX ) 40 MG tablet Take 1 tablet by mouth daily. 04/08/18   [provider]  spironolactone  (ALDACTONE ) 50 MG tablet Take 1 tablet by mouth daily. 12/30/22   [provider]  Ustekinumab Sherley Distad) 45 MG/0.5ML SOLN 90mg  subcutaneous every 8weeks 12/21/20   [provider]  XIFAXAN 550 MG TABS tablet Take 550 mg by mouth 2 (two) times daily. 11/28/22   [provider]     ALLERGIES:  No Known Allergies   SOCIAL HISTORY:  Social History   Socioeconomic History   Marital status:  Married    Spouse name: Not on file   Number of children: Not on file   Years of education: Not on file   Highest education level: Not on file  Occupational History   Not on file  Tobacco Use   Smoking status: Former    Current packs/day: 0.00    Types: Cigarettes    Quit date: 98    Years since quitting: 40.3   Smokeless tobacco: Never  Substance and Sexual Activity   Alcohol use: Yes   Drug use: Not Currently   Sexual activity: Not Currently  Other Topics Concern   Not on file  Social History Narrative   Not on file   Social Drivers of Health   Financial Resource Strain: Low Risk  (04/14/2023)   Received from [facility], [facility]   Overall Financial Resource Strain (CARDIA)    Difficulty of Paying Living Expenses: Not very hard  Food Insecurity: No Food Insecurity (04/14/2023)   Received from [facility], [facility]   Hunger Vital Sign    Worried About Running Out of Food in the Last Year: Never true    Ran Out of Food in the Last Year: Never true  Transportation Needs: No Transportation Needs (04/14/2023)   Received from [facility], [facility]   [facility] - Transportation    Lack of Transportation (Medical): No    Lack of Transportation (Non-Medical): No  Physical Activity: Inactive (04/19/2021)   Received from [facility], [facility]   Exercise Vital Sign    Days of Exercise per Week: 0 days    Minutes of Exercise per Session: 0 min  Stress: No Stress Concern Present (04/19/2021)   Received from [facility], [facility] - Occupational Stress Questionnaire    Feeling of Stress : Not at all  Social Connections: Socially Integrated (04/19/2021)   Received from [facility], [facility]   Social Connection and Isolation Panel [NHANES]    Frequency of Communication with Friends and Family: More than three times a week    Frequency of Social Gatherings with Friends and Family:  More than three times a week    Attends Religious Services: More than 4 times per year    Active Member of [employer] or Organizations: Yes    Attends Engineer, structural: More than 4 times per year    Marital Status: Married  Catering manager Violence: Patient Unable To Answer (04/05/2024)   Received from [facility]   Humiliation, Afraid, Rape, and Kick questionnaire    Fear of Current or Ex-Partner: Patient unable to answer    Emotionally Abused: Patient unable to answer    Physically Abused: Patient unable to answer    Sexually Abused: Patient unable to answer     FAMILY HISTORY:  History reviewed. No pertinent family history.    REVIEW OF SYSTEMS:  Review of Systems  Constitutional:  Negative for chills  and fever.  Respiratory:  Negative for cough and shortness of breath.   Cardiovascular:  Negative for chest pain and palpitations.  Gastrointestinal:  Positive for abdominal pain, nausea and vomiting.  Genitourinary:  Negative for dysuria and urgency.  All other systems reviewed and are negative.   VITAL SIGNS:  Temp:  [97.8 F (36.6 C)] 97.8 F (36.6 C) (04/29 0536) Pulse Rate:  [73-85] 83 (04/29 0653) Resp:  [16-20] 16 (04/29 0653) BP: (120-181)/(71-87) 120/86 (04/29 0653) SpO2:  [93 %-98 %] 93 % (04/29 0653) Weight:  [71.2 kg] 71.2 kg (04/29 0133)     Height: 5\' 6"  (167.6 cm) Weight: 71.2 kg BMI (Calculated): 25.35   INTAKE/OUTPUT:  No intake/output data recorded.  PHYSICAL EXAM:  Physical Exam Vitals and nursing note reviewed. Exam conducted with a chaperone present.  Constitutional:      General: He is not in acute distress.    Appearance: He is well-developed. He is not ill-appearing.     Comments: Resting in bed; appears uncomfortable, NAD  HENT:     Head: Normocephalic and atraumatic.     Comments: NGT in place  Eyes:     General: No scleral icterus.    Extraocular Movements: Extraocular movements intact.     Pupils: Pupils are equal, round,  and reactive to light.  Cardiovascular:     Rate and Rhythm: Normal rate.     Heart sounds: Normal heart sounds. No murmur heard. Pulmonary:     Effort: Pulmonary effort is normal. No respiratory distress.  Abdominal:     General: A surgical scar is present. The ostomy site is clean. There is distension.     Palpations: Abdomen is soft.     Tenderness: There is generalized abdominal tenderness.     Comments: Abdomen is soft, distended, generalized tenderness, colostomy in left abdomen; no significant output. Significant midline scars present   Genitourinary:    Comments: Deferred Skin:    General: Skin is warm and dry.  Neurological:     General: No focal deficit present.     Mental Status: He is alert and oriented to person, place, and time.  Psychiatric:        Mood and Affect: Mood normal.        Behavior: Behavior normal.      Labs:     Latest Ref Rng & Units 04/06/2024    1:35 AM 05/08/2022    4:27 AM 05/07/2022    4:02 AM  CBC  WBC 4.0 - 10.5 K/uL 19.5  9.0  8.4   Hemoglobin 13.0 - 17.0 g/dL 16.1  09.6  04.5   Hematocrit 39.0 - 52.0 % 48.6  30.6  31.1   Platelets 150 - 400 K/uL 209  168  182       Latest Ref Rng & Units 04/06/2024    1:35 AM 05/09/2022    4:41 AM 05/08/2022    4:27 AM  CMP  Glucose 70 - 99 mg/dL 409  86  811   BUN 8 - 23 mg/dL 26  16  23    Creatinine 0.61 - 1.24 mg/dL 9.14  7.82  9.56   Sodium 135 - 145 mmol/L 135  140  138   Potassium 3.5 - 5.1 mmol/L 4.6  3.4  3.8   Chloride 98 - 111 mmol/L 109  119  117   CO2 22 - 32 mmol/L 19  21  17    Calcium 8.9 - 10.3 mg/dL 21.3  7.5  7.5  Total Protein 6.5 - 8.1 g/dL 9.1     Total Bilirubin 0.0 - 1.2 mg/dL 2.2     Alkaline Phos 38 - 126 U/L 157     AST 15 - 41 U/L 61     ALT 0 - 44 U/L 49        Imaging studies:   CT Abdomen/Pelvis (04/06/2024) personally reviewed with significant dilation of small bowel consistent with SBO, known changes secondary to history of HCC, and radiologist report reviewed  below: IMPRESSION: 1. Surgical changes of ileocolectomy and left lower quadrant diverting loop sigmoid colostomy. 2. Multiple dilated loops of small bowel measuring up to 6 cm with fecalized intraluminal contents likely related to stasis related to multiple points of obstruction related to underlying adhesions. 3. Cirrhosis with increasing nodular liver contour. 4. Interval development of numerous parenchymal hypodensities throughout the liver which are indeterminate on this examination. While these may represent regenerating nodules, the possibility of metastatic disease or multifocal hepatoma is not excluded. This could be further assessed with dedicated MRI examination once the patient's acute issues have resolved. 5. Renal atrophy in keeping with chronic renal insufficiency. Numerous nonobstructing bilateral renal calculi. No hydronephrosis. 6. Mild sigmoid and descending colonic diverticulosis. 7. Remote T9, T10, and T11 compression deformities.   Assessment/Plan: (ICD-10's: K57.609) 83 y.o. male with SBO likely secondary to significant post-surgical adhesive disease given his extensive surgical history, complicated by magnitude of previous surgeries, HCC.   - Appreciate medicine admission - Agree with NGT decompression; LIS; monitor and record output daily - No emergent surgical intervention. He understands he is at extremely high risk given his previous procedures and has been told previously he is not a surgical candidate due to this. Should he fail to improve in the next few days, we would recommend transfer to [facility] given the complexity of this case  - Monitor abdominal examination; serial KUB as needed - can consider gastrografin  challenge if fails to open up as well    - Pain control prn; antiemetics prn - Mobilize as tolerated - Further management per primary service; we will follow along    All of the above findings and recommendations were discussed with the patient, and  all of patient's questions were answered to his expressed satisfaction.  Thank you for the opportunity to participate in this patient's care.   Face-to-face time spent with the patient and care providers was 60 minutes, with more than 50% of the time spent counseling, educating, and coordinating care of the patient.     -- Apolonio Bay, PA-C [facility] 04/06/2024, 7:36 AM M-F: 7am - 4pm

## 2024-04-06 NOTE — Progress Notes (Signed)
 Progress Note   Patient: Dean Ryan PPI:[id number] DOB: 1941/07/23 DOA: 04/06/2024     0 DOS: the patient was seen and examined on 04/06/2024   Brief hospital course: 83 y.o. 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  Patient last saw his gastroenterologist at [facility] the day prior on 4/28 when he mentioned that he had been having episodic/monthly mid abdominal pain associated with nausea, distention and decreased ostomy output.  The episodes usually resolve after about a day with staying n.p.o. and lying flat. On the night of arrival he developed worsening abdominal pain and nonbloody nonbilious vomiting.  Also had minimal ostomy output.  Denies fever or chills.  Denies chest pain, shortness of breath, lower extremity swelling beyond his baseline ED course and data review BP 181/87 with otherwise normal vitals Labs notable for WBC 19,000 with mild metabolic acidosis on CMP.  Elevated liver enzymes likely in keeping with known liver disease Lipase 62, AST/ALT 61/49 with alk phos 157 and total bilirubin 2.2 (up from 51/41 and 216 at [facility] on  4/22)   EKG, personally reviewed.  Showed sinus at 62 with multiple PVCs   Chest x-ray nonacute CT abdomen and pelvis with contrast showing multiple points of obstruction among other chronic findings as outlined below: IMPRESSION: 1. Surgical changes of ileocolectomy and left lower quadrant diverting loop sigmoid colostomy. 2. Multiple dilated loops of small bowel measuring up to 6 cm with fecalized intraluminal contents likely related to stasis related to multiple points of obstruction related to underlying adhesions. 3. Cirrhosis with increasing nodular liver contour. 4.  Interval development of numerous parenchymal hypodensities throughout the liver which are indeterminate on this examination. While these may represent regenerating nodules, the possibility of metastatic disease or multifocal hepatoma is not excluded. This could be further assessed with dedicated MRI examination once the patient's acute issues have resolved. 5. Renal atrophy in keeping with chronic renal insufficiency. Numerous nonobstructing bilateral renal calculi. No hydronephrosis. 6. Mild sigmoid and descending colonic diverticulosis. 7. Remote T9, T10, and T11 compression deformities.   Patient treated with an NS bolus, Zofran , morphine   4/29.  Patient feels a little bit better with NG tube then.  Last output out of ostomy on 28th.  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  Conservative management.  Case discussed with general surgery and they will watch him here for few days to see how things progress.  May end up needing transfer to [facility] if things do not progress.  Essential hypertension Hydralazine as needed while n.p.o.  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  Looks like an echocardiogram in 2023 shows an EF of 55%.  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        Subjective: Patient with numerous small bowel obstructions in the past and history of Crohn's colitis.  Patient feeling a little  bit better since coming  in.  Patient admitted with small bowel obstruction.  Physical Exam: Vitals:   04/06/24 0918 04/06/24 1044 04/06/24 1200 04/06/24 1300  BP:  119/61 132/67   Pulse: 81 78 86   Resp: 18 13 19    Temp: 98 F (36.7 C)   97.9 F (36.6 C)  TempSrc: Oral   Oral  SpO2: 96% 95% 94%   Weight:      Height:       Physical Exam HENT:     Head: Normocephalic.  Cardiovascular:     Rate and Rhythm: Normal rate and regular rhythm.     Heart sounds: Normal heart sounds, S1 normal and S2 normal.  Pulmonary:     Breath sounds: No decreased breath sounds, wheezing, rhonchi or rales.  Abdominal:     General: Bowel sounds are decreased.     Palpations: Abdomen is soft.     Tenderness: There is generalized abdominal tenderness.  Musculoskeletal:     Right lower leg: No swelling.     Left lower leg: No swelling.  Skin:    General: Skin is warm.     Findings: No rash.  Neurological:     Mental Status: He is alert and oriented to person, place, and time.     Data Reviewed: Creatinine 1.18, lipase 62, AST 61, ALT 49, total protein 9.1, white blood cell count 19.5, hemoglobin 16.7, platelet 209  Family Communication: Updated wife on the phone  Disposition: Status is: Observation Patient with NG tube and admitted for recurrent small bowel obstruction.  Continue IV fluid hydration.  Planned Discharge Destination: Home    Time spent: 28 minutes  Author: Verla Glaze, MD 04/06/2024 1:19 PM  For on call review [email or web address].

## 2024-04-06 NOTE — Assessment & Plan Note (Signed)
 Holding Stelara

## 2024-04-06 NOTE — ED Provider Notes (Signed)
 [facility] Provider Note    Event Date/Time   First MD Initiated Contact with Patient 04/06/24 0127     (approximate)   History   Abdominal Pain and Emesis   HPI  Dean Ryan is a 83 y.o. male brought to the ED via EMS from home with a chief complaint of abdominal pain and vomiting.  Patient with a history of Crohn's disease status post ostomy with history of SBO, liver cancer who endorses abdominal pain and emesis tonight.  Usually able to control his symptoms at home but tonight he was not able to.  Denies associated fever/chills, chest pain, shortness of breath, diarrhea.     Past Medical History   Past Medical History:  Diagnosis Date   Crohn disease (HCC)    Liver cancer (HCC)    Neoplasm of skin with sebaceous differentiation 09/19/2022   SEBACEOUS NEOPLASM WITH ATYPICAL FEATURES, Patient saw Dr. Xadrian Chimes for scheduled Mohs 10/08/22. Per Dr. Chantal Comment note the sebaceous neoplasm had resolved with biopsy and they were to reassess 3 months later.   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 SCC-KA type. [facility] 02/20/2023   Squamous cell carcinoma of skin 09/01/2023   Right forearm. WD SCC. Excision 10/08/23     Active Problem List   Patient Active Problem List   Diagnosis Date Noted   CKD (chronic kidney disease) stage 3, GFR 30-59 ml/min (HCC) 05/07/2022   Sinus bradycardia 05/07/2022   Perforated bowel (HCC) 05/04/2022   SBO (small bowel obstruction) (HCC) 11/2021   Physical deconditioning 11/2021     Past Surgical History   Past Surgical History:  Procedure Laterality Date   COLOSTOMY  2022     Home Medications   Prior to Admission medications    Medication Sig Start Date End Date Taking? Authorizing Provider  furosemide  (LASIX ) 20 MG tablet Take 1 tablet (20 mg total) by mouth daily as needed. 05/09/22   Patel, Sona, MD  loratadine  (CLARITIN ) 10 MG tablet Take 10 mg by mouth daily.     [provider]  Magnesium Oxide (MAG-200 PO) Take 1 tablet by mouth daily. 12/27/21   [provider]  metoprolol  succinate (TOPROL -XL) 50 MG 24 hr tablet Take 1 tablet by mouth daily. 10/23/17   [provider]  Multiple Vitamins-Minerals (PRESERVISION AREDS 2 PO) Take 1 tablet by mouth daily.    [provider]  mupirocin  ointment (BACTROBAN ) 2 % Apply 1 Application topically daily. 10/08/23   Harris Liming, MD  pantoprazole  (PROTONIX ) 40 MG tablet Take 1 tablet by mouth daily. 04/08/18   [provider]  spironolactone  (ALDACTONE ) 50 MG tablet Take 1 tablet by mouth daily. 12/30/22   [provider]  Ustekinumab Sherley Distad) 45 MG/0.5ML SOLN 90mg  subcutaneous every 8weeks 12/21/20   [provider]  XIFAXAN 550 MG TABS tablet Take 550 mg by mouth 2 (two) times daily. 11/28/22   [provider]     Allergies  Patient has no known allergies.   Family History  History reviewed. No pertinent family history.   Physical Exam  Triage Vital Signs: ED Triage Vitals  Encounter Vitals Group     BP  Systolic BP Percentile      Diastolic BP Percentile      Pulse      Resp      Temp      Temp src      SpO2      Weight      Height      Head Circumference      Peak Flow      Pain Score      Pain Loc      Pain Education      Exclude from Growth Chart     Updated Vital Signs: BP (!) 181/87 (BP Location: Right Arm)   Pulse 85   Temp 97.8 F (36.6 C) (Oral)   Resp 18   Ht 5\' 6"  (1.676 m)   Wt 71.2 kg   SpO2 98%   BMI 25.34 kg/m    General: Awake, moderate distress.  CV:  RRR.  Good peripheral perfusion.  Resp:  Normal effort.  CTAB. Abd:  Mild to moderate  generalized abdominal tenderness to palpation without rebound or guarding.  Colostomy noted with tan stool in bag.  No distention.  Actively vomiting. Other:  No truncal vesicles.   ED Results / Procedures / Treatments  Labs (all labs ordered are listed, but only abnormal results are displayed) Labs Reviewed  CBC WITH DIFFERENTIAL/PLATELET - Abnormal; Notable for the following components:      Result Value   WBC 19.5 (*)    Neutro Abs 16.8 (*)    Monocytes Absolute 1.2 (*)    Abs Immature Granulocytes 0.14 (*)    All other components within normal limits  COMPREHENSIVE METABOLIC PANEL WITH GFR - Abnormal; Notable for the following components:   CO2 19 (*)    Glucose, Bld 156 (*)    BUN 26 (*)    Total Protein 9.1 (*)    AST 61 (*)    ALT 49 (*)    Alkaline Phosphatase 157 (*)    Total Bilirubin 2.2 (*)    All other components within normal limits  LIPASE, BLOOD - Abnormal; Notable for the following components:   Lipase 62 (*)    All other components within normal limits  TROPONIN I (HIGH SENSITIVITY)  TROPONIN I (HIGH SENSITIVITY)     EKG  ED ECG REPORT I, Deejay Koppelman J, the attending physician, personally viewed and interpreted this ECG.   Date: 04/06/2024  EKG Time: 0144  Rate: 78  Rhythm: normal sinus rhythm  Axis: LAD  Intervals:left bundle branch block  ST&T Change: Nonspecific    RADIOLOGY I have independently visualized interpreted patient's imaging studies as well as noted the radiology interpretation:  CT abdomen/pelvis: SBO, cirrhosis  Chest x-ray: No acute cardiopulmonary process  Official radiology report(s): CT ABDOMEN PELVIS W CONTRAST Result Date: 04/06/2024 CLINICAL DATA:  Acute nonlocalized abdominal pain. Bowel obstruction. Crohn's disease. "Liver cancer" EXAM: CT ABDOMEN AND PELVIS WITH CONTRAST TECHNIQUE: Multidetector CT imaging of the abdomen and pelvis was performed using the standard protocol following bolus administration of intravenous  contrast. RADIATION DOSE REDUCTION: This exam was performed according to the departmental dose-optimization program which includes automated exposure control, adjustment of the mA and/or kV according to patient size and/or use of iterative reconstruction technique. CONTRAST:  OMNIPAQUE  IOHEXOL  300 MG/ML  SOLN COMPARISON:  None Available. FINDINGS: Lower chest: No acute abnormality.  Mild cardiomegaly. Hepatobiliary: Cirrhosis with increasing nodular liver contour when compared to prior examination. There is development of numerous parenchymal hypodensities throughout the  liver which are indeterminate. Larger heterogeneous hypodensity subcapsular segment 5 and axial image # 28/2 may reflect an area thermal ablation, but is, again, indeterminate. No intrahepatic biliary ductal dilation. Mild extrahepatic biliary ductal dilation appears stable likely represents post cholecystectomy change. Status post cholecystectomy. Pancreas: Unremarkable Spleen: Unremarkable Adrenals/Urinary Tract: The adrenal glands are unremarkable. The kidneys are atrophic bilaterally, right greater than left, similar to prior examination and in keeping with changes end-stage renal disease. Numerous nonobstructing calculi are seen within kidneys bilaterally measuring up to 16-17 mm. No hydronephrosis. No ureteral calculi. Bladder is unremarkable. Stomach/Bowel: Left lower quadrant diverting loop sigmoid colostomy is identified. Surgical changes of ileocolectomy are noted. The residual small-bowel appears adherent to each other as well as the anterior abdominal wall likely related to a numerous underlying adhesions. There is numerous loops fluid-filled dilated small bowel measuring up to 6 cm and demonstrating both layering fluid is well as fecalized intraluminal contents likely related to stasis suggesting multiple points of obstruction related to underlying adhesions. No free intraperitoneal gas or fluid. Mild sigmoid and descending  colonic diverticulosis. Vascular/Lymphatic: Aortic atherosclerosis. No enlarged abdominal or pelvic lymph nodes. Reproductive: Prostate is unremarkable. Other: Small bilateral fat containing inguinal hernias. Musculoskeletal: No acute bone abnormality. No lytic or blastic lesion. Remote compression deformities T9, T10 and are noted. IMPRESSION: 1. Surgical changes of ileocolectomy and left lower quadrant diverting loop sigmoid colostomy. 2. Multiple dilated loops of small bowel measuring up to 6 cm with fecalized intraluminal contents likely related to stasis related to multiple points of obstruction related to underlying adhesions. 3. Cirrhosis with increasing nodular liver contour. 4. Interval development of numerous parenchymal hypodensities throughout the liver which are indeterminate on this examination. While these may represent regenerating nodules, the possibility of metastatic disease or multifocal hepatoma is not excluded. This could be further assessed with dedicated MRI examination once the patient's acute issues have resolved. 5. Renal atrophy in keeping with chronic renal insufficiency. Numerous nonobstructing bilateral renal calculi. No hydronephrosis. 6. Mild sigmoid and descending colonic diverticulosis. 7. Remote T9, T10, and T11 compression deformities. Aortic Atherosclerosis (ICD10-I70.0). Electronically Signed   By: Worthy Heads M.D.   On: 04/06/2024 02:50   DG Chest Port 1 View Result Date: 04/06/2024 CLINICAL DATA:  Vomiting EXAM: PORTABLE CHEST 1 VIEW COMPARISON:  None Available. FINDINGS: The heart size and mediastinal contours are within normal limits. Both lungs are clear. The visualized skeletal structures are unremarkable. IMPRESSION: No active disease. Electronically Signed   By: Worthy Heads M.D.   On: 04/06/2024 01:57     PROCEDURES:  Critical Care performed: Yes, see critical care procedure note(s)  CRITICAL CARE Performed by: Norlene Beavers   Total critical care  time: 45 minutes  Critical care time was exclusive of separately billable procedures and treating other patients.  Critical care was necessary to treat or prevent imminent or life-threatening deterioration.  Critical care was time spent personally by me on the following activities: development of treatment plan with patient and/or surrogate as well as nursing, discussions with consultants, evaluation of patient's response to treatment, examination of patient, obtaining history from patient or surrogate, ordering and performing treatments and interventions, ordering and review of laboratory studies, ordering and review of radiographic studies, pulse oximetry and re-evaluation of patient's condition.   Aaron Aas1-3 Lead EKG Interpretation  Performed by: Norlene Beavers, MD Authorized by: Norlene Beavers, MD     Interpretation: normal     ECG rate:  85   ECG rate assessment: normal  Rhythm: sinus rhythm     Ectopy: none     Conduction: normal   Comments:     Patient placed on cardiac monitor to evaluate for arrhythmias    MEDICATIONS ORDERED IN ED: Medications  butamben-tetracaine-benzocaine (CETACAINE) spray 1 spray (has no administration in time range)  sodium chloride  0.9 % bolus 1,000 mL (1,000 mLs Intravenous New Bag/Given 04/06/24 0138)  ondansetron  (ZOFRAN ) injection 4 mg (4 mg Intravenous Given 04/06/24 0140)  morphine  (PF) 4 MG/ML injection 4 mg (4 mg Intravenous Given 04/06/24 0141)  iohexol  (OMNIPAQUE ) 300 MG/ML solution 100 mL (100 mLs Intravenous Contrast Given 04/06/24 0226)     IMPRESSION / MDM / ASSESSMENT AND PLAN / ED COURSE  I reviewed the triage vital signs and the nursing notes.                             83 year old male presenting with abdominal pain, nausea and vomiting. Differential diagnosis includes, but is not limited to, acute appendicitis, renal colic, testicular torsion, urinary tract infection/pyelonephritis, prostatitis,  epididymitis, diverticulitis, small bowel  obstruction or ileus, colitis, abdominal aortic aneurysm, gastroenteritis, hernia, etc. I personally reviewed patient's records and note a GI visit from yesterday for follow-up Crohn's disease, HCC, type 2 diabetes.  Patient's presentation is most consistent with acute complicated illness / injury requiring diagnostic workup.  The patient is on the cardiac monitor to evaluate for evidence of arrhythmia and/or significant heart rate changes.  Will obtain cardiac panel, CT abdomen/pelvis.  Keep NPO, initiate IV fluid resuscitation, IV morphine  for pain, IV Zofran  for nausea/vomiting.  Will reassess.  Clinical Course as of 04/06/24 0316  Tue Apr 06, 2024  0313 Patient feeling better.  Updated patient and spouse on laboratory and CT imaging results.  Agreeable to NG tube decompression.  Will consult hospitalist services for evaluation and admission. [JS]    Clinical Course User Index [JS] Norlene Beavers, MD     FINAL CLINICAL IMPRESSION(S) / ED DIAGNOSES   Final diagnoses:  Generalized abdominal pain  Nausea and vomiting, unspecified vomiting type  SBO (small bowel obstruction) (HCC)     Rx / DC Orders   ED Discharge Orders     None        Note:  This document was prepared using Dragon voice recognition software and may include unintentional dictation errors.   Norlene Beavers, MD 04/06/24 [phone]

## 2024-04-06 NOTE — ED Notes (Signed)
 Verbal ok from [place] MD to use NG tube

## 2024-04-06 NOTE — Assessment & Plan Note (Signed)
Hydralazine as needed while n.p.o.

## 2024-04-06 NOTE — Assessment & Plan Note (Addendum)
 Crohn's colitis with intestinal obstruction S/p hemicolectomy with ileocolonic anastomosis and left-sided end colostomy Continue NG tube and IV fluids.  Conservative management.  Case discussed with general surgery and they will watch him here for few days to see how things progress.  May end up needing transfer to [facility] if things do not progress.

## 2024-04-07 DIAGNOSIS — D84821 Immunodeficiency due to drugs: Secondary | ICD-10-CM

## 2024-04-07 DIAGNOSIS — C220 Liver cell carcinoma: Secondary | ICD-10-CM | POA: Diagnosis not present

## 2024-04-07 DIAGNOSIS — K760 Fatty (change of) liver, not elsewhere classified: Secondary | ICD-10-CM | POA: Diagnosis not present

## 2024-04-07 DIAGNOSIS — K56609 Unspecified intestinal obstruction, unspecified as to partial versus complete obstruction: Secondary | ICD-10-CM | POA: Diagnosis not present

## 2024-04-07 DIAGNOSIS — I10 Essential (primary) hypertension: Secondary | ICD-10-CM | POA: Diagnosis not present

## 2024-04-07 LAB — CBC
HCT: 47.1 % (ref 39.0–52.0)
Hemoglobin: 15.9 g/dL (ref 13.0–17.0)
MCH: 32.3 pg (ref 26.0–34.0)
MCHC: 33.8 g/dL (ref 30.0–36.0)
MCV: 95.5 fL (ref 80.0–100.0)
Platelets: 244 10*3/uL (ref 150–400)
RBC: 4.93 MIL/uL (ref 4.22–5.81)
RDW: 15.2 % (ref 11.5–15.5)
WBC: 12.6 10*3/uL — ABNORMAL HIGH (ref 4.0–10.5)
nRBC: 0 % (ref 0.0–0.2)

## 2024-04-07 LAB — BASIC METABOLIC PANEL WITH GFR
Anion gap: 11 (ref 5–15)
BUN: 54 mg/dL — ABNORMAL HIGH (ref 8–23)
CO2: 18 mmol/L — ABNORMAL LOW (ref 22–32)
Calcium: 9 mg/dL (ref 8.9–10.3)
Chloride: 110 mmol/L (ref 98–111)
Creatinine, Ser: 1.85 mg/dL — ABNORMAL HIGH (ref 0.61–1.24)
GFR, Estimated: 36 mL/min — ABNORMAL LOW (ref >60)
Glucose, Bld: 167 mg/dL — ABNORMAL HIGH (ref 70–99)
Potassium: 5.6 mmol/L — ABNORMAL HIGH (ref 3.5–5.1)
Sodium: 139 mmol/L (ref 135–145)

## 2024-04-07 MED ORDER — RIFAXIMIN 550 MG PO TABS
550.0000 mg | ORAL_TABLET | Freq: Two times a day (BID) | ORAL | Status: DC
  Administered 2024-04-07 – 2024-04-08 (×2): 550 mg via ORAL
  Filled 2024-04-07 (×2): qty 1

## 2024-04-07 MED ORDER — ONDANSETRON HCL 4 MG PO TABS: 4.0000 mg | ORAL_TABLET | ORAL | Status: DC | PRN

## 2024-04-07 MED ORDER — SODIUM ZIRCONIUM CYCLOSILICATE 10 G PO PACK
10.0000 g | PACK | Freq: Once | ORAL | Status: DC
  Filled 2024-04-07: qty 1

## 2024-04-07 MED ORDER — ACETAMINOPHEN 325 MG PO TABS: 650.0000 mg | ORAL_TABLET | Freq: Four times a day (QID) | ORAL | Status: DC | PRN

## 2024-04-07 MED ORDER — ONDANSETRON HCL 4 MG/2ML IJ SOLN: 4.0000 mg | Freq: Four times a day (QID) | INTRAMUSCULAR | Status: DC | PRN

## 2024-04-07 MED ORDER — ACETAMINOPHEN 650 MG RE SUPP: 650.0000 mg | Freq: Four times a day (QID) | RECTAL | Status: DC | PRN

## 2024-04-07 MED ORDER — SODIUM ZIRCONIUM CYCLOSILICATE 10 G PO PACK
10.0000 g | PACK | Freq: Once | ORAL | Status: AC
  Administered 2024-04-07: 10 g via ORAL
  Filled 2024-04-07: qty 1

## 2024-04-07 NOTE — Progress Notes (Signed)
 [facility] SURGICAL PROGRESS NOTE (cpt [phone])  Hospital Day(s): 1.   Interval History: Patient seen and examined, no acute events or new complaints overnight. Patient reports he is feeling better. Abdominal pain improved. Still mildly distended. No fever, chills, nausea, emesis. Previous leukocytosis improving; WBC 12.6K this AM. Hgb to 15.9. Bump in sCr - 1.85; UO - 425 ccs. Hyperkalemia to 5.6. NGT in place; output 350 ccs. He has had return of colostomy function; 1025 ccs recorded. NPO  Review of Systems:  Constitutional: denies fever, chills  HEENT: denies cough or congestion  Respiratory: denies any shortness of breath  Cardiovascular: denies chest pain or palpitations  Gastrointestinal: denies abdominal pain, N/V Genitourinary: denies burning with urination or urinary frequency Musculoskeletal: denies pain, decreased motor or sensation  Vital signs in last 24 hours: [min-max] current  Temp:  [97.5 F (36.4 C)-98 F (36.7 C)] 97.5 F (36.4 C) (04/30 0421) Pulse Rate:  [78-95] 91 (04/30 0421) Resp:  [13-20] 18 (04/30 0421) BP: (114-135)/(61-75) 130/71 (04/30 0421) SpO2:  [93 %-98 %] 97 % (04/30 0421)     Height: 5\' 6"  (167.6 cm) Weight: 71.2 kg BMI (Calculated): 25.35   Intake/Output last 2 shifts:  04/29 0701 - 04/30 0700 In: 1252.6 [I.V.:402.6; NG/GT:850] Out: 1800 [Urine:425; Emesis/NG output:350; Stool:1025]   Physical Exam:  Constitutional: alert, cooperative and no distress  HENT: normocephalic without obvious abnormality; NGT removed Eyes: PERRL, EOM's grossly intact and symmetric  Respiratory: breathing non-labored at rest  Cardiovascular: regular rate and sinus rhythm  Gastrointestinal: soft, non-tender, tympany to upper abdomen, no rebound/guarding. Colostomy in left abdomen now with stool and gas in bag Musculoskeletal: no edema or wounds, motor and sensation grossly intact, NT    Labs:     Latest Ref Rng & Units 04/07/2024    4:00 AM  04/06/2024    1:35 AM 05/08/2022    4:27 AM  CBC  WBC 4.0 - 10.5 K/uL 12.6  19.5  9.0   Hemoglobin 13.0 - 17.0 g/dL 62.1  30.8  65.7   Hematocrit 39.0 - 52.0 % 47.1  48.6  30.6   Platelets 150 - 400 K/uL 244  209  168       Latest Ref Rng & Units 04/07/2024    4:00 AM 04/06/2024    1:35 AM 05/09/2022    4:41 AM  CMP  Glucose 70 - 99 mg/dL 846  962  86   BUN 8 - 23 mg/dL 54  26  16   Creatinine 0.61 - 1.24 mg/dL 9.52  8.41  3.24   Sodium 135 - 145 mmol/L 139  135  140   Potassium 3.5 - 5.1 mmol/L 5.6  4.6  3.4   Chloride 98 - 111 mmol/L 110  109  119   CO2 22 - 32 mmol/L 18  19  21    Calcium 8.9 - 10.3 mg/dL 9.0  40.1  7.5   Total Protein 6.5 - 8.1 g/dL  9.1    Total Bilirubin 0.0 - 1.2 mg/dL  2.2    Alkaline Phos 38 - 126 U/L  157    AST 15 - 41 U/L  61    ALT 0 - 44 U/L  49       Imaging studies: No new pertinent imaging studies   Assessment/Plan: (ICD-10's: K50.609) 83 y.o. male with return of ostomy function, admitted with SBO likely secondary to significant post-surgical adhesive disease given his extensive surgical history, complicated by magnitude of previous surgeries, HCC.               -  Appreciate medicine admission - Given significant bowel function now, NGT removed after discussion with patient and daughter.  - He can do CLD this morning  - No emergent surgical intervention.             - Monitor abdominal examination; on-going colostomy function              - Pain control prn; antiemetics prn - Mobilize as tolerated - Further management per primary service; we will follow along     All of the above findings and recommendations were discussed with the patient, and all of patient's questions were answered to his expressed satisfaction.  -- Apolonio Bay, PA-C [facility] 04/07/2024, 7:18 AM M-F: 7am - 4pm

## 2024-04-07 NOTE — Progress Notes (Signed)
 PROGRESS NOTE    Dean Ryan  UJW:[id number] DOB: Feb 10, 1941 DOA: 04/06/2024 PCP: [name], PA-C  Chief Complaint  Patient presents with   Abdominal Pain   Emesis    Hospital Course:  Dean Ryan is an 83 y.o. 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  Patient was last seen by gastroenterology 9/28 when he reported episodic abdominal pain with nausea, distention, and decreased ostomy output.  His episodes typically resolve after a day of n.p.o. at home.  Patient's episode was not resolving and he developed worsening abdominal pain and nonbloody nonbilious vomiting and thus presented to the ED.  He was also found to have lower extremity swelling beyond his baseline.  He has hypertensive to 181/87, had leukocytosis of 19, mild metabolic acidosis on CMP, and elevated liver enzymes.  He was admitted and general surgery was consulted.  CT scan revealed multiple dilated loops of small bowel measuring up to 6 cm with fecalized intraluminal contents likely related to stasis with multiple points of obstruction secondary to underlying adhesions.  Patient was started on an NG tube and made NPO.  Gradually his colostomy output improved.  He was initiated on clear liquid diet 4/30.   Subjective: This morning patient reports he is feeling much better.  He has been started on a clear liquid diet with significant ostomy output.  He is tolerating clears without issue.   Objective: Vitals:   04/06/24 1600 04/06/24 2014 04/07/24 0421 04/07/24 0731  BP: 115/69 114/75 130/71 113/67  Pulse: 95 88 91 84  Resp: 20 18 18 18   Temp: 97.6 F (36.4 C) (!) 97.5 F (36.4 C) (!) 97.5 F (36.4 C) 97.8 F (36.6 C)  TempSrc: Oral     SpO2:  97% 98% 97% 96%  Weight:      Height:        Intake/Output Summary (Last 24 hours) at 04/07/2024 0923 Last data filed at 04/07/2024 0640 Gross per 24 hour  Intake 1252.64 ml  Output 1800 ml  Net -547.36 ml   Filed Weights   04/06/24 0133  Weight: 71.2 kg    Examination: General exam: Appears calm and comfortable, NAD  Respiratory system: No work of breathing, symmetric chest wall expansion Cardiovascular system: S1 & S2 heard, RRR.  Gastrointestinal system: Abdomen is nondistended, soft and nontender.  Ostomy with liquid brown output Neuro: Alert and oriented. No focal neurological deficits. Extremities: Symmetric, expected ROM Skin: No rashes, lesions Psychiatry: Demonstrates appropriate judgement and insight. Mood & affect appropriate for situation.   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 - Colostomy function has returned - Initiated on clear liquid diet by general surgery this morning, tolerating well. - NGT removed - Patient reports he has more success with continuous suction in the low intermittent suction.  He requests if readmitted in the future to proceed with  continuous suction immediately - Patient follows with [facility] outpatient.  For now we will manage here, and transfer if needed.  No acute surgical concerns  Hypertension - Resume home meds, titrate as needed  Hepatocellular carcinoma NAFLD cirrhosis with portal  hypertension - Resume home meds - Ammonia 59, currently receiving rifaximin by mouth.  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  Previously has had EF as low as 35% clinically he is euvolemic at this time.  Will avoid additional IV fluids for now. 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  Afebrile - Continue to monitor vital signs closely.  Hyperkalemia - Lokelma ordered  CKD stage IIIb - Baseline creatinine appears to be around 1.47 - Patient is slightly above his baseline now - Now that colostomy function has returned we will encourage p.o. intake with current CLD -- Repeat CMP in AM   DVT prophylaxis: Lovenox    Code Status: Full Code Disposition:  Inpatient pending resolution in kidney function and slow advancement of diet.  Likely discharge home tomorrow.  Has home palliative care services already established.  Care discussed with the patient as well as with his daughter and wife at bedside  Consultants:  Treatment Team:  Consulting Physician: Barrett Lick, MD  Procedures:    Antimicrobials:  Anti-infectives (From admission, onward)    Start     Dose/Rate Route Frequency Ordered Stop   04/06/24 1457  rifaximin (XIFAXAN) tablet 550 mg        550 mg Per Tube 2 times daily 04/06/24 1457     04/06/24 1400  rifaximin (XIFAXAN) tablet 550 mg  Status:  Discontinued         550 mg Per Tube 2 times daily 04/06/24 1349 04/06/24 1457       Data Reviewed: I have personally reviewed following labs and imaging studies CBC: Recent Labs  Lab 04/06/24 0135 04/07/24 0400  WBC 19.5* 12.6*  NEUTROABS 16.8*  --   HGB 16.7 15.9  HCT 48.6 47.1  MCV 94.2 95.5  PLT 209 244   Basic Metabolic Panel: Recent Labs  Lab 04/06/24 0135 04/07/24 0400  NA 135 139  K 4.6 5.6*  CL 109 110  CO2 19* 18*  GLUCOSE 156* 167*  BUN 26* 54*  CREATININE 1.18 1.85*  CALCIUM 10.0 9.0   GFR: Estimated Creatinine Clearance: 28.3 mL/min (A) (by C-G formula based on SCr of 1.85 mg/dL (H)). Liver Function Tests: Recent Labs  Lab 04/06/24 0135  AST 61*  ALT 49*  ALKPHOS 157*  BILITOT 2.2*  PROT 9.1*  ALBUMIN 3.9   CBG: No results for input(s): "GLUCAP" in the last 168 hours.  No results found for this or any previous visit (from the past 240 hours).   Radiology Studies: DG Abd Portable 1 View Result Date: 04/06/2024 CLINICAL DATA:  Check NGT placement.  Small-bowel obstruction. EXAM: PORTABLE ABDOMEN - 1 VIEW COMPARISON:  CT abdomen and pelvis with IV contrast  today at 2:28 a.m. FINDINGS: 4:12 a.m. Current exam excludes the true pelvis. NGT is now inserted with the tip abutting the far wall of the body of stomach. Small bowel dilatation in the upper to mid abdomen again measures 4.8 cm, on CT was greater. Surgical clips are again noted in the right upper, mid and lower abdomen. There is contrast in the renal collecting systems and ureters. There is no supine evidence of free air or other significant radiographic findings. A cluster of tiny caliceal stones in the midpole left kidney was better demonstrated on CT. IMPRESSION: 1. NGT tip abuts the far wall of the body of stomach. 2. Small bowel dilatation in the upper to mid abdomen again measures 4.8 cm, on CT was greater. Electronically Signed   By: Denman Fischer M.D.   On: 04/06/2024 04:36   CT ABDOMEN PELVIS W  CONTRAST Result Date: 04/06/2024 CLINICAL DATA:  Acute nonlocalized abdominal pain. Bowel obstruction. Crohn's disease. "Liver cancer" EXAM: CT ABDOMEN AND PELVIS WITH CONTRAST TECHNIQUE: Multidetector CT imaging of the abdomen and pelvis was performed using the standard protocol following bolus administration of intravenous contrast. RADIATION DOSE REDUCTION: This exam was performed according to the departmental dose-optimization program which includes automated exposure control, adjustment of the mA and/or kV according to patient size and/or use of iterative reconstruction technique. CONTRAST:  OMNIPAQUE  IOHEXOL  300 MG/ML  SOLN COMPARISON:  None Available. FINDINGS: Lower chest: No acute abnormality.  Mild cardiomegaly. Hepatobiliary: Cirrhosis with increasing nodular liver contour when compared to prior examination. There is development of numerous parenchymal hypodensities throughout the liver which are indeterminate. Larger heterogeneous hypodensity subcapsular segment 5 and axial image # 28/2 may reflect an area thermal ablation, but is, again, indeterminate. No intrahepatic biliary ductal dilation. Mild extrahepatic biliary ductal dilation appears stable likely represents post cholecystectomy change. Status post cholecystectomy. Pancreas: Unremarkable Spleen: Unremarkable Adrenals/Urinary Tract: The adrenal glands are unremarkable. The kidneys are atrophic bilaterally, right greater than left, similar to prior examination and in keeping with changes end-stage renal disease. Numerous nonobstructing calculi are seen within kidneys bilaterally measuring up to 16-17 mm. No hydronephrosis. No ureteral calculi. Bladder is unremarkable. Stomach/Bowel: Left lower quadrant diverting loop sigmoid colostomy is identified. Surgical changes of ileocolectomy are noted. The residual small-bowel appears adherent to each other as well as the anterior abdominal wall likely related to a numerous underlying adhesions.  There is numerous loops fluid-filled dilated small bowel measuring up to 6 cm and demonstrating both layering fluid is well as fecalized intraluminal contents likely related to stasis suggesting multiple points of obstruction related to underlying adhesions. No free intraperitoneal gas or fluid. Mild sigmoid and descending colonic diverticulosis. Vascular/Lymphatic: Aortic atherosclerosis. No enlarged abdominal or pelvic lymph nodes. Reproductive: Prostate is unremarkable. Other: Small bilateral fat containing inguinal hernias. Musculoskeletal: No acute bone abnormality. No lytic or blastic lesion. Remote compression deformities T9, T10 and are noted. IMPRESSION: 1. Surgical changes of ileocolectomy and left lower quadrant diverting loop sigmoid colostomy. 2. Multiple dilated loops of small bowel measuring up to 6 cm with fecalized intraluminal contents likely related to stasis related to multiple points of obstruction related to underlying adhesions. 3. Cirrhosis with increasing nodular liver contour. 4. Interval development of numerous parenchymal hypodensities throughout the liver which are indeterminate on this examination. While these may represent regenerating nodules, the possibility of metastatic disease or multifocal hepatoma is not excluded. This could be further assessed with dedicated MRI examination once the patient's acute issues have resolved. 5. Renal atrophy  in keeping with chronic renal insufficiency. Numerous nonobstructing bilateral renal calculi. No hydronephrosis. 6. Mild sigmoid and descending colonic diverticulosis. 7. Remote T9, T10, and T11 compression deformities. Aortic Atherosclerosis (ICD10-I70.0). Electronically Signed   By: Worthy Heads M.D.   On: 04/06/2024 02:50   DG Chest Port 1 View Result Date: 04/06/2024 CLINICAL DATA:  Vomiting EXAM: PORTABLE CHEST 1 VIEW COMPARISON:  None Available. FINDINGS: The heart size and mediastinal contours are within normal limits. Both lungs are  clear. The visualized skeletal structures are unremarkable. IMPRESSION: No active disease. Electronically Signed   By: Worthy Heads M.D.   On: 04/06/2024 01:57    Scheduled Meds:  pantoprazole  (PROTONIX ) IV  40 mg Intravenous Daily   rifaximin  550 mg Per Tube BID   sodium zirconium cyclosilicate  10 g Per Tube Once   Continuous Infusions:  promethazine (PHENERGAN) injection (IM or IVPB) 12.5 mg (04/07/24 0327)     LOS: 1 day  MDM: Patient is high risk for one or more organ failure.  They necessitate ongoing hospitalization for continued IV therapies and subsequent lab monitoring. Total time spent interpreting labs and vitals, reviewing the medical record, coordinating care amongst consultants and care team members, directly assessing and discussing care with the patient and/or family: 55 min  Pixie Burgener, DO [facility]  To contact the attending physician between 7A-7P please use Epic Chat. To contact the covering physician during after hours 7P-7A, please review Amion.  04/07/2024, 9:23 AM   *This document has been created with the assistance of dictation software. Please excuse typographical errors. *

## 2024-04-07 NOTE — Progress Notes (Addendum)
 Patient alert and oriented x4, NG tube in place, complained of nausea and vomiting this shift, hospitalist notified, zofran  PRN changed to q4hr PRN and IVPB phenergan ordered for refractory PRN. Will continue to monitor.

## 2024-04-07 NOTE — Progress Notes (Signed)
   This pt is active with [facility]. This is a home based palliative care program provided by [facility].   We will follow along and assist with coordination of care at d/c. This pt is eligible to have services resumed at d/c.   He can also have any other out pt services needed at d/c with this program in place.   Lyla Samuels RN BSN, [employer] [phone]

## 2024-04-07 NOTE — Evaluation (Signed)
 Physical Therapy Evaluation Patient Details Name: Dean Ryan MRN: [id number] DOB: 04-Jun-1941 Today's Date: 04/07/2024  History of Present Illness  Pt is an 83 y.o. 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   Clinical Impression  Patient alert, agreeable to PT oriented x4 but reported feeling "out of it" and feeling like his ammonia was high. Pt stated at baseline he is modI/I with rollator, lives with his wife.  He was able to transition to EOB modI, use of rails. Sit <> Stand without RW able to step pivot to recliner CGA-minA. Second transfer with RW and supervision, able to ambulate ~28ft. Exhibited decreased gait velocity, shuffled step, and fatigued quickly, chair follow for safety. Pt up in recliner with needs in reach.  Overall the patient demonstrated deficits (see "PT Problem List") that impede the patient's functional abilities, safety, and mobility and would benefit from skilled PT intervention.          If plan is discharge home, recommend the following: A little help with walking and/or transfers;A little help with bathing/dressing/bathroom;Assistance with cooking/housework;Assistance with feeding;Help with stairs or ramp for entrance   Can travel by private vehicle        Equipment Recommendations None recommended by PT  Recommendations for Other Services       Functional Status Assessment Patient has had a recent decline in their functional status and demonstrates the ability to make significant improvements in function in a reasonable and predictable amount of time.     Precautions / Restrictions Precautions Precautions: Fall Precaution/Restrictions Comments: colostomy ~ 30  years Restrictions Weight Bearing Restrictions Per Provider Order: No      Mobility  Bed Mobility Overal bed mobility: Modified Independent                  Transfers Overall transfer level: Needs assistance Equipment used: Rolling walker (2 wheels) Transfers: Sit to/from Stand Sit to Stand: Contact guard assist                Ambulation/Gait Ambulation/Gait assistance: Contact guard assist Gait Distance (Feet): 55 Feet (chair follow for safety) Assistive device: Rolling walker (2 wheels)         General Gait Details: shuffled step, very decreased gait velocity, no unsteadiness but pt fatigued easily  Stairs            Wheelchair Mobility     Tilt Bed    Modified Rankin (Stroke Patients Only)       Balance Overall balance assessment: Needs assistance Sitting-balance support: Feet supported Sitting balance-Leahy Scale: Good     Standing balance support: Reliant on assistive device for balance, During functional activity, Bilateral upper extremity supported Standing balance-Leahy Scale: Fair                               Pertinent Vitals/Pain Pain Assessment Pain Assessment: Faces Faces Pain Scale: Hurts even more Pain Location: abdomen Pain Descriptors / Indicators: Discomfort Pain Intervention(s): Limited activity within patient's tolerance, Monitored during session, Repositioned    Home Living Family/patient expects to be discharged to:: Private residence Living Arrangements: Spouse/significant other Available Help at Discharge: Family;Available PRN/intermittently Type of Home: House Home Access: Level entry       Home Layout: One  level Home Equipment: Rollator (4 wheels);BSC/3in1;Tub bench;Cane - single point      Prior Function Prior Level of Function : Independent/Modified Independent;Driving                     Extremity/Trunk Assessment   Upper Extremity Assessment Upper Extremity Assessment:  Overall WFL for tasks assessed    Lower Extremity Assessment Lower Extremity Assessment: Generalized weakness       Communication   Communication Communication: Impaired Factors Affecting Communication: Hearing impaired    Cognition Arousal: Alert Behavior During Therapy: WFL for tasks assessed/performed                             Following commands: Intact       Cueing       General Comments      Exercises     Assessment/Plan    PT Assessment Patient needs continued PT services  PT Problem List Decreased strength;Decreased range of motion;Decreased activity tolerance;Decreased balance;Decreased mobility       PT Treatment Interventions DME instruction;Balance training;Gait training;Neuromuscular re-education;Stair training;Patient/family education;Functional mobility training;Therapeutic activities;Therapeutic exercise    PT Goals (Current goals can be found in the Care Plan section)  Acute Rehab PT Goals Patient Stated Goal: to feel better PT Goal Formulation: With patient Time For Goal Achievement: 04/21/24 Potential to Achieve Goals: Good    Frequency Min 2X/week     Co-evaluation               AM-PAC PT "6 Clicks" Mobility  Outcome Measure Help needed turning from your back to your side while in a flat bed without using bedrails?: None Help needed moving from lying on your back to sitting on the side of a flat bed without using bedrails?: None Help needed moving to and from a bed to a chair (including a wheelchair)?: A Little Help needed standing up from a chair using your arms (e.g., wheelchair or bedside chair)?: A Little Help needed to walk in hospital room?: A Little Help needed climbing 3-5 steps with a railing? : A Little 6 Click Score: 20    End of Session Equipment Utilized During Treatment: Gait belt Activity Tolerance: Patient tolerated treatment well Patient left: in chair;with call bell/phone within reach;with chair  alarm set Nurse Communication: Mobility status PT Visit Diagnosis: Other abnormalities of gait and mobility (R26.89);Difficulty in walking, not elsewhere classified (R26.2);Muscle weakness (generalized) (M62.81)    Time: 1610-9604 PT Time Calculation (min) (ACUTE ONLY): 23 min   Charges:   PT Evaluation $PT Eval Low Complexity: 1 Low PT Treatments $Therapeutic Activity: 8-22 mins PT General Charges $$ ACUTE PT VISIT: 1 Visit         Darien Eden PT, DPT 12:56 PM,04/07/24

## 2024-04-07 NOTE — Plan of Care (Signed)
 The patient has had episodes of N/V several times tonight. PRN IV Zofran  was given at 2223 and PRN IV Phenergan was given at 0350am with some effect noted.

## 2024-04-07 NOTE — Evaluation (Signed)
 Occupational Therapy Evaluation Patient Details Name: Dean Ryan MRN: [id number] DOB: [birth year] Today's Date: 04/07/2024   History of Present Illness   atient seen and examined, no acute events or new complaints overnight. Patient reports he is feeling better. Abdominal pain improved. Still mildly distended. No fever, chills, nausea, emesis. Previous leukocytosis improving; WBC 12.6K this AM. Hgb to 15.9. Bump in sCr - 1.85; UO - 425 ccs. Hyperkalemia to 5.6. NGT in place; output 350 ccs. He has had return of colostomy function     Clinical Impressions Patient presenting with decreased Ind in self care,balance, functional mobility/transfers, endurance, and safety awareness. Patient reports being Ind at baseline and living at home with wife. Pt has had colostomy for 30 years and is well versed with it's care. Pt stands with supervision and ambulates to bathroom with RW and supervision. Patient currently functioning at supervision overall for self care tasks. Pt managing clothing, hygiene, and demonstrates ability to don/doff B socks with supervision overall. Pt stands at sink for grooming tasks with supervision and then returns to bed secondary to fatigue. Call bell and all needed items within reach.  Patient will benefit from acute OT to increase overall independence in the areas of ADLs, functional mobility, and safety awareness in order to safely discharge.      If plan is discharge home, recommend the following:   A little help with walking and/or transfers;A little help with bathing/dressing/bathroom;Assistance with cooking/housework;Assist for transportation;Help with stairs or ramp for entrance     Functional Status Assessment   Patient has had a recent decline in their functional status and demonstrates the ability to make significant improvements in function in a reasonable and predictable amount of time.     Equipment Recommendations   None recommended by OT       Precautions/Restrictions   Precautions Precautions: Fall Precaution/Restrictions Comments: colostomy ~ 30 years     Mobility Bed Mobility Overal bed mobility: Modified Independent             General bed mobility comments: increased time and effort but no physical assistance for sit >supine    Transfers Overall transfer level: Needs assistance Equipment used: Rolling walker (2 wheels) Transfers: Sit to/from Stand Sit to Stand: Supervision                  Balance Overall balance assessment: Needs assistance Sitting-balance support: Feet supported Sitting balance-Leahy Scale: Good     Standing balance support: Reliant on assistive device for balance, During functional activity, Bilateral upper extremity supported Standing balance-Leahy Scale: Fair                             ADL either performed or assessed with clinical judgement   ADL Overall ADL's : Needs assistance/impaired     Grooming: Wash/dry hands;Wash/dry face;Standing;Supervision/safety                   Toilet Transfer: Supervision/safety;Rolling walker (2 wheels);Regular Toilet   Toileting- Clothing Manipulation and Hygiene: Supervision/safety;Sit to/from stand       Functional mobility during ADLs: Supervision/safety;Rolling walker (2 wheels) General ADL Comments: Pt able to empty colostomy while seated on commode     Vision Baseline Vision/History: 1 Wears glasses Patient Visual Report: No change from baseline              Pertinent Vitals/Pain Pain Assessment Pain Assessment: Faces Faces Pain Scale: Hurts a little bit Pain Location: abdomen  Pain Descriptors / Indicators: Discomfort Pain Intervention(s): Limited activity within patient's tolerance, Monitored during session, Repositioned     Extremity/Trunk Assessment Upper Extremity Assessment Upper Extremity Assessment: Overall WFL for tasks assessed   Lower Extremity Assessment Lower Extremity  Assessment: Defer to PT evaluation       Communication Communication Communication: Impaired Factors Affecting Communication: Hearing impaired   Cognition Arousal: Alert Behavior During Therapy: WFL for tasks assessed/performed Cognition: No apparent impairments                               Following commands: Intact       Cueing  General Comments   Cueing Techniques: Verbal cues              Home Living Family/patient expects to be discharged to:: Private residence Living Arrangements: Spouse/significant other Available Help at Discharge: Family;Available PRN/intermittently Type of Home: House Home Access: Level entry     Home Layout: One level     Bathroom Shower/Tub: Tub/shower unit;Walk-in shower         Home Equipment: Rollator (4 wheels);BSC/3in1;Tub bench;Cane - single point          Prior Functioning/Environment Prior Level of Function : Independent/Modified Independent;Driving                    OT Problem List: Decreased strength;Decreased activity tolerance;Impaired balance (sitting and/or standing)   OT Treatment/Interventions: Self-care/ADL training;Therapeutic exercise;Therapeutic activities;Energy conservation;Patient/family education;DME and/or AE instruction;Balance training      OT Goals(Current goals can be found in the care plan section)   Acute Rehab OT Goals Patient Stated Goal: to go home OT Goal Formulation: With patient Time For Goal Achievement: 04/21/24 Potential to Achieve Goals: Fair ADL Goals Pt Will Perform Grooming: with modified independence;standing Pt Will Perform Lower Body Dressing: with modified independence;sit to/from stand Pt Will Transfer to Toilet: with modified independence;ambulating Pt Will Perform Toileting - Clothing Manipulation and hygiene: with modified independence;sit to/from stand   OT Frequency:  Min 2X/week    Co-evaluation              AM-PAC OT "6 Clicks"  Daily Activity     Outcome Measure Help from another person eating meals?: None Help from another person taking care of personal grooming?: None Help from another person toileting, which includes using toliet, bedpan, or urinal?: A Little Help from another person bathing (including washing, rinsing, drying)?: A Little Help from another person to put on and taking off regular upper body clothing?: None Help from another person to put on and taking off regular lower body clothing?: None 6 Click Score: 22   End of Session Equipment Utilized During Treatment: Rolling walker (2 wheels) Nurse Communication: Mobility status  Activity Tolerance: Patient tolerated treatment well Patient left: in bed;with call bell/phone within reach;with bed alarm set  OT Visit Diagnosis: Unsteadiness on feet (R26.81);Repeated falls (R29.6);Muscle weakness (generalized) (M62.81)                Time: 1000-1020 OT Time Calculation (min): 20 min Charges:  OT General Charges $OT Visit: 1 Visit OT Evaluation $OT Eval Low Complexity: 1 Low OT Treatments $Self Care/Home Management : 8-22 mins  George Kinder, MS, OTR/L , CBIS ascom [phone]  04/07/24, 12:55 PM

## 2024-04-08 DIAGNOSIS — I10 Essential (primary) hypertension: Secondary | ICD-10-CM | POA: Diagnosis not present

## 2024-04-08 DIAGNOSIS — K56609 Unspecified intestinal obstruction, unspecified as to partial versus complete obstruction: Secondary | ICD-10-CM | POA: Diagnosis not present

## 2024-04-08 DIAGNOSIS — C220 Liver cell carcinoma: Secondary | ICD-10-CM | POA: Diagnosis not present

## 2024-04-08 DIAGNOSIS — K760 Fatty (change of) liver, not elsewhere classified: Secondary | ICD-10-CM | POA: Diagnosis not present

## 2024-04-08 LAB — COMPREHENSIVE METABOLIC PANEL WITH GFR
ALT: 36 U/L (ref 0–44)
AST: 31 U/L (ref 15–41)
Albumin: 2.8 g/dL — ABNORMAL LOW (ref 3.5–5.0)
Alkaline Phosphatase: 97 U/L (ref 38–126)
Anion gap: 8 (ref 5–15)
BUN: 58 mg/dL — ABNORMAL HIGH (ref 8–23)
CO2: 17 mmol/L — ABNORMAL LOW (ref 22–32)
Calcium: 8 mg/dL — ABNORMAL LOW (ref 8.9–10.3)
Chloride: 110 mmol/L (ref 98–111)
Creatinine, Ser: 1.68 mg/dL — ABNORMAL HIGH (ref 0.61–1.24)
GFR, Estimated: 41 mL/min — ABNORMAL LOW (ref >60)
Glucose, Bld: 100 mg/dL — ABNORMAL HIGH (ref 70–99)
Potassium: 4 mmol/L (ref 3.5–5.1)
Sodium: 135 mmol/L (ref 135–145)
Total Bilirubin: 1.9 mg/dL — ABNORMAL HIGH (ref 0.0–1.2)
Total Protein: 6.8 g/dL (ref 6.5–8.1)

## 2024-04-08 LAB — CBC WITH DIFFERENTIAL/PLATELET
Abs Immature Granulocytes: 0.04 10*3/uL (ref 0.00–0.07)
Basophils Absolute: 0.1 10*3/uL (ref 0.0–0.1)
Basophils Relative: 1 %
Eosinophils Absolute: 0.3 10*3/uL (ref 0.0–0.5)
Eosinophils Relative: 3 %
HCT: 38.8 % — ABNORMAL LOW (ref 39.0–52.0)
Hemoglobin: 13.6 g/dL (ref 13.0–17.0)
Immature Granulocytes: 0 %
Lymphocytes Relative: 18 %
Lymphs Abs: 1.7 10*3/uL (ref 0.7–4.0)
MCH: 33.1 pg (ref 26.0–34.0)
MCHC: 35.1 g/dL (ref 30.0–36.0)
MCV: 94.4 fL (ref 80.0–100.0)
Monocytes Absolute: 1.8 10*3/uL — ABNORMAL HIGH (ref 0.1–1.0)
Monocytes Relative: 19 %
Neutro Abs: 5.4 10*3/uL (ref 1.7–7.7)
Neutrophils Relative %: 59 %
Platelets: 180 10*3/uL (ref 150–400)
RBC: 4.11 MIL/uL — ABNORMAL LOW (ref 4.22–5.81)
RDW: 14.8 % (ref 11.5–15.5)
WBC: 9.3 10*3/uL (ref 4.0–10.5)
nRBC: 0 % (ref 0.0–0.2)

## 2024-04-08 LAB — GLUCOSE, CAPILLARY
Glucose-Capillary: 103 mg/dL — ABNORMAL HIGH (ref 70–99)
Glucose-Capillary: 145 mg/dL — ABNORMAL HIGH (ref 70–99)

## 2024-04-08 LAB — MAGNESIUM: Magnesium: 2.1 mg/dL (ref 1.7–2.4)

## 2024-04-08 LAB — PHOSPHORUS: Phosphorus: 2.4 mg/dL — ABNORMAL LOW (ref 2.5–4.6)

## 2024-04-08 MED ORDER — ENOXAPARIN SODIUM 40 MG/0.4ML IJ SOSY: 40.0000 mg | PREFILLED_SYRINGE | INTRAMUSCULAR | Status: DC

## 2024-04-08 MED ORDER — LACTATED RINGERS IV BOLUS
500.0000 mL | Freq: Once | INTRAVENOUS | Status: AC
  Administered 2024-04-08: 500 mL via INTRAVENOUS

## 2024-04-08 NOTE — Progress Notes (Signed)
 Patient is being discharged home to self care. Patient wife at bedside for discharge instruction. A copy of AVS given to patient and patient educated on Dr. Instructions

## 2024-04-08 NOTE — Plan of Care (Signed)
 No s/s of acute distress noted.

## 2024-04-08 NOTE — Discharge Summary (Signed)
 Physician Discharge Summary   Patient: Dean Ryan MRN: [id number] DOB: June 15, 1941  Admit date:     04/06/2024  Discharge date: 04/08/24  Discharge Physician: Roise Cleaver   PCP: [name], PA-C   Recommendations at discharge:  Follow-up with primary care physician to recheck kidney function and ensure return to normal  Discharge Diagnoses: Principal Problem:   Small bowel obstruction, recurrent (HCC) Active Problems:   Essential hypertension   Hepatocellular carcinoma (HCC)   Metabolic dysfunction-associated steatotic liver disease (MASLD)   Crohn's colitis, with intestinal obstruction (HCC)   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 *  Hospital Course: Dean Ryan is an 83 y.o. 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  Patient was last seen by gastroenterology 4/28 when he reported episodic abdominal pain with nausea, distention, and decreased ostomy output.  His episodes typically resolve after a day of n.p.o. at home.  Patient's episode was not resolving and he developed worsening abdominal pain and nonbloody nonbilious vomiting and thus presented to the ED.  He was also found to have lower extremity swelling beyond his baseline.  He had hypertension to 181/87, leukocytosis of 19, mild metabolic acidosis on CMP, and elevated liver enzymes.  He was admitted and general surgery was consulted.  CT scan revealed multiple dilated loops of small bowel measuring up to 6 cm with fecalized intraluminal contents likely related to stasis with multiple points of  obstruction secondary to underlying adhesions.  Patient was started on an NG tube and made NPO.  General surgery was consulted.  Gradually his colostomy output improved.  He was initiated on clear liquid diet 4/30, and slowly advanced on 5/1. Stay was complicated by AKI.  AKI appeared to be provoked by dehydration, patient received small fluid bolus and increased p.o. intake with some resolution.  He will need to follow-up with his primary care physician to recheck kidney function in 1 week to ensure complete resolution.  By afternoon of 5/1 patient was tolerating his diet, had excellent ostomy output, and was back to his physiologic baseline.  We discussed ER return precautions.  Patient is discharging home today.    Assessment and Plan:  Small bowel obstruction, recurrent Crohn's colitis with intestinal obstruction Status post hemicolectomy with ileocolonic anastomosis and left-sided end colostomy. - Colostomy function has returned --Tolerating diet now - Patient reports he has more success with continuous suction than low intermittent suction.  He requests if readmitted in the future to proceed with continuous suction immediately - Patient follows with [facility] outpatient.     Hypertension - Resume home meds, titrate as needed   Hepatocellular carcinoma MAFLD cirrhosis with portal hypertension - Resume home meds - Ammonia 59, currently receiving rifaximin  by mouth.  If SBO recurs will place rifaximin  through colostomy as family reports success with this previously at [facility] - Outpatient follow-up with GI - Patient is at his mental status baseline.  No need to continue to trend ammonia. 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  Previously has had EF as low as 35% clinically he is euvolemic at this time.  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 resolved now - No acute infection appreciated at this time.  Afebrile   Hyperkalemia - Resolved, status post lokelma    CKD stage IIIb - Baseline creatinine appears to be around 1.47 - Patient is slightly above his baseline now, likely prerenal injury secondary to dehydration. - Beginning to improve, follow-up with PCP for recheck - Patient has chronically high colostomy output.  Have encouraged increased p.o. fluids.  500 cc bolus today.   Consultants: Surgery Procedures performed: n/a  Disposition: Home Diet recommendation:  Discharge Diet Orders (From admission, onward)     Start     Ordered   04/08/24 0000  Diet general        04/08/24 1336           Regular diet DISCHARGE MEDICATION: Allergies as of 04/08/2024   No Known Allergies      Medication List     STOP taking these medications    furosemide  20 MG tablet Commonly known as: LASIX    metoprolol  succinate 50 MG 24 hr tablet Commonly known as: TOPROL -XL   spironolactone  50 MG tablet Commonly known as: ALDACTONE        TAKE these medications    loratadine  10 MG tablet Commonly known as: CLARITIN  Take 10 mg by mouth daily.   MAG-200 PO Take 1 tablet by mouth in the morning and at bedtime.   mupirocin  ointment 2 % Commonly known as: BACTROBAN  Apply 1 Application topically daily.   pantoprazole  40 MG tablet Commonly known as: PROTONIX  Take 40 mg by mouth daily.   PRESERVISION AREDS 2 PO Take 1 tablet by mouth daily.   Stelara 45 MG/0.5ML injection Generic drug: ustekinumab 90mg  subcutaneous every 8weeks   Xifaxan  550 MG Tabs tablet Generic drug:  rifaximin  Take 550 mg by mouth 2 (two) times daily.        Follow-up Information     [name], PA-C Follow up.   Specialty: Internal Medicine Why: Hospital follow up Contact information: [address] [place]. [place] [address] [phone]                Discharge Exam: Cleavon Curls Weights   04/06/24 0133  Weight: 71.2 kg   General exam: Appears calm and comfortable, NAD  Respiratory system: No work of breathing, symmetric chest wall expansion Cardiovascular system: S1 & S2 heard, RRR.  Gastrointestinal system: Abdomen is nondistended, soft and nontender.  Ostomy with liquid brown output Neuro: Alert and oriented. No focal neurological deficits. Extremities: Symmetric, expected ROM Skin: No rashes, lesions Psychiatry: Demonstrates appropriate judgement and insight. Mood & affect appropriate for situation.   Condition at discharge: stable  The results of significant diagnostics from this hospitalization (including imaging, microbiology, ancillary and laboratory) are listed below for reference.   Imaging Studies: DG Abd Portable 1 View Result Date: 04/06/2024 CLINICAL DATA:  Check NGT placement.  Small-bowel obstruction. EXAM: PORTABLE ABDOMEN - 1 VIEW COMPARISON:  CT abdomen and pelvis with IV contrast today at 2:28 a.m. FINDINGS: 4:12 a.m. Current exam excludes the true pelvis. NGT is now inserted with the tip abutting the far wall of the  body of stomach. Small bowel dilatation in the upper to mid abdomen again measures 4.8 cm, on CT was greater. Surgical clips are again noted in the right upper, mid and lower abdomen. There is contrast in the renal collecting systems and ureters. There is no supine evidence of free air or other significant radiographic findings. A cluster of tiny caliceal stones in the midpole left kidney was better demonstrated on CT. IMPRESSION: 1. NGT tip abuts the far wall of the body of stomach. 2. Small bowel dilatation in the  upper to mid abdomen again measures 4.8 cm, on CT was greater. Electronically Signed   By: Denman Fischer M.D.   On: 04/06/2024 04:36   CT ABDOMEN PELVIS W CONTRAST Result Date: 04/06/2024 CLINICAL DATA:  Acute nonlocalized abdominal pain. Bowel obstruction. Crohn's disease. "Liver cancer" EXAM: CT ABDOMEN AND PELVIS WITH CONTRAST TECHNIQUE: Multidetector CT imaging of the abdomen and pelvis was performed using the standard protocol following bolus administration of intravenous contrast. RADIATION DOSE REDUCTION: This exam was performed according to the departmental dose-optimization program which includes automated exposure control, adjustment of the mA and/or kV according to patient size and/or use of iterative reconstruction technique. CONTRAST:  100mL OMNIPAQUE  IOHEXOL  300 MG/ML  SOLN COMPARISON:  None Available. FINDINGS: Lower chest: No acute abnormality.  Mild cardiomegaly. Hepatobiliary: Cirrhosis with increasing nodular liver contour when compared to prior examination. There is development of numerous parenchymal hypodensities throughout the liver which are indeterminate. Larger heterogeneous hypodensity subcapsular segment 5 and axial image # 28/2 may reflect an area thermal ablation, but is, again, indeterminate. No intrahepatic biliary ductal dilation. Mild extrahepatic biliary ductal dilation appears stable likely represents post cholecystectomy change. Status post cholecystectomy. Pancreas: Unremarkable Spleen: Unremarkable Adrenals/Urinary Tract: The adrenal glands are unremarkable. The kidneys are atrophic bilaterally, right greater than left, similar to prior examination and in keeping with changes end-stage renal disease. Numerous nonobstructing calculi are seen within kidneys bilaterally measuring up to 16-17 mm. No hydronephrosis. No ureteral calculi. Bladder is unremarkable. Stomach/Bowel: Left lower quadrant diverting loop sigmoid colostomy is identified. Surgical changes of ileocolectomy  are noted. The residual small-bowel appears adherent to each other as well as the anterior abdominal wall likely related to a numerous underlying adhesions. There is numerous loops fluid-filled dilated small bowel measuring up to 6 cm and demonstrating both layering fluid is well as fecalized intraluminal contents likely related to stasis suggesting multiple points of obstruction related to underlying adhesions. No free intraperitoneal gas or fluid. Mild sigmoid and descending colonic diverticulosis. Vascular/Lymphatic: Aortic atherosclerosis. No enlarged abdominal or pelvic lymph nodes. Reproductive: Prostate is unremarkable. Other: Small bilateral fat containing inguinal hernias. Musculoskeletal: No acute bone abnormality. No lytic or blastic lesion. Remote compression deformities T9, T10 and are noted. IMPRESSION: 1. Surgical changes of ileocolectomy and left lower quadrant diverting loop sigmoid colostomy. 2. Multiple dilated loops of small bowel measuring up to 6 cm with fecalized intraluminal contents likely related to stasis related to multiple points of obstruction related to underlying adhesions. 3. Cirrhosis with increasing nodular liver contour. 4. Interval development of numerous parenchymal hypodensities throughout the liver which are indeterminate on this examination. While these may represent regenerating nodules, the possibility of metastatic disease or multifocal hepatoma is not excluded. This could be further assessed with dedicated MRI examination once the patient's acute issues have resolved. 5. Renal atrophy in keeping with chronic renal insufficiency. Numerous nonobstructing bilateral renal calculi. No hydronephrosis. 6. Mild sigmoid and descending colonic diverticulosis. 7. Remote T9, T10, and T11  compression deformities. Aortic Atherosclerosis (ICD10-I70.0). Electronically Signed   By: Worthy Heads M.D.   On: 04/06/2024 02:50   DG Chest Port 1 View Result Date: 04/06/2024 CLINICAL DATA:   Vomiting EXAM: PORTABLE CHEST 1 VIEW COMPARISON:  None Available. FINDINGS: The heart size and mediastinal contours are within normal limits. Both lungs are clear. The visualized skeletal structures are unremarkable. IMPRESSION: No active disease. Electronically Signed   By: Worthy Heads M.D.   On: 04/06/2024 01:57    Microbiology: Results for orders placed or performed during the hospital encounter of 05/04/22  Blood culture (routine x 2)     Status: None   Collection Time: 05/04/22  3:00 PM   Specimen: BLOOD  Result Value Ref Range Status   Specimen Description BLOOD BLOOD RIGHT ARM  Final   Special Requests   Final    BOTTLES DRAWN AEROBIC AND ANAEROBIC Blood Culture results may not be optimal due to an excessive volume of blood received in culture bottles   Culture   Final    NO GROWTH 5 DAYS Performed at [facility], [address] [place], [place] [address]    Report Status 05/09/2022 FINAL  Final  Blood culture (routine x 2)     Status: None   Collection Time: 05/04/22  7:10 PM   Specimen: BLOOD  Result Value Ref Range Status   Specimen Description BLOOD RIGHT ANTECUBITAL  Final   Special Requests   Final    BOTTLES DRAWN AEROBIC AND ANAEROBIC Blood Culture adequate volume   Culture   Final    NO GROWTH 5 DAYS Performed at [facility], [address] [place], [place] [address]    Report Status 05/09/2022 FINAL  Final  Calprotectin, Fecal     Status: Abnormal   Collection Time: 05/05/22 12:39 PM   Specimen: STOOL  Result Value Ref Range Status   Calprotectin, Fecal >800 (H) 0 - 120 ug/g Final    Comment: (NOTE) Concentration     Interpretation   Follow-Up < 5 - 50 ug/g     Normal           None >50 -120 ug/g     Borderline       Re-evaluate in 4-6 weeks    >120 ug/g     Abnormal         Repeat as clinically                                   indicated Performed At: [facility] [address] [place], [place] [id number] Pearlean Botts  MD XB:[id number]     Labs: CBC: Recent Labs  Lab 04/06/24 0135 04/07/24 0400 04/08/24 0555  WBC 19.5* 12.6* 9.3  NEUTROABS 16.8*  --  5.4  HGB 16.7 15.9 13.6  HCT 48.6 47.1 38.8*  MCV 94.2 95.5 94.4  PLT 209 244 180   Basic Metabolic Panel: Recent Labs  Lab 04/06/24 0135 04/07/24 0400 04/08/24 0555  NA 135 139 135  K 4.6 5.6* 4.0  CL 109 110 110  CO2 19* 18* 17*  GLUCOSE 156* 167* 100*  BUN 26* 54* 58*  CREATININE 1.18 1.85* 1.68*  CALCIUM 10.0 9.0 8.0*  MG  --   --  2.1  PHOS  --   --  2.4*   Liver Function Tests: Recent Labs  Lab 04/06/24 0135 04/08/24 0555  AST 61* 31  ALT 49*  36  ALKPHOS 157* 97  BILITOT 2.2* 1.9*  PROT 9.1* 6.8  ALBUMIN 3.9 2.8*   CBG: Recent Labs  Lab 04/08/24 0741 04/08/24 1154  GLUCAP 103* 145*    Discharge time spent: 32 min  Signed: Roise Cleaver, DO [facility] 04/08/2024

## 2024-04-08 NOTE — Progress Notes (Signed)
  Progress Note   Date: 04/08/2024  Patient Name: Dean Ryan        MRN#: [id number]  Review of the patient's clinical findings supports the diagnosis of  Acute kidney injury on chronic kidney disease stage 3b

## 2024-04-08 NOTE — Progress Notes (Signed)
 Physical Therapy Treatment Patient Details Name: Dean Ryan MRN: [id number] DOB: [birth year] Today's Date: 04/08/2024   History of Present Illness Pt is an 83 y.o. 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    PT Comments  Pt received in bed, family at bed side. Pt states he is feeling better and will d/c home this afternoon per MD. Rollator brought to room to assess safe mobility. Pt demonstrated Supervision for bed mobility, transfers, and gait in hallway with Rollator (baseline AD) without LOB, good safety awareness, no c/o dizziness. Pt appears close to baseline LOF for safe return home with supportive family. Pt also declining HHPT.   If plan is discharge home, recommend the following: A little help with walking and/or transfers;A little help with bathing/dressing/bathroom;Assistance with cooking/housework;Assistance with feeding;Help with stairs or ramp for entrance   Can travel by private vehicle        Equipment Recommendations  None recommended by PT    Recommendations for Other Services       Precautions / Restrictions Precautions Precautions: Fall Restrictions Weight Bearing Restrictions Per Provider Order: No     Mobility  Bed Mobility Overal bed mobility: Modified Independent                  Transfers Overall transfer level: Needs assistance Equipment used: Rollator (4 wheels) Transfers: Sit to/from Stand Sit to Stand: Supervision           General transfer comment: No difficulty with transfers from various surfaces    Ambulation/Gait Ambulation/Gait assistance: Supervision Gait Distance (Feet): 175 Feet Assistive device: Rollator (4 wheels) Gait Pattern/deviations:  WFL(Within Functional Limits), Step-through pattern       General Gait Details: Less fatigued this date, no LOB   Stairs             Wheelchair Mobility     Tilt Bed    Modified Rankin (Stroke Patients Only)       Balance Overall balance assessment: Needs assistance Sitting-balance support: Feet supported Sitting balance-Leahy Scale: Good     Standing balance support: Single extremity supported, Bilateral upper extremity supported, During functional activity Standing balance-Leahy Scale: Fair Standing balance comment: Pt able to ambulate to/from bathroom without AD safely                            Communication Communication Communication: No apparent difficulties  Cognition Arousal: Alert Behavior During Therapy: WFL for tasks assessed/performed   PT - Cognitive impairments: No apparent impairments                         Following commands: Intact      Cueing Cueing Techniques: Verbal cues  Exercises      General Comments General comments (skin integrity, edema, etc.): Pt and family educated on safe transition home, proper height of Rollator at home for optimal support, and returning to PLOF      Pertinent Vitals/Pain Pain Assessment Pain Assessment: No/denies pain    Home Living                          Prior Function            PT  Goals (current goals can now be found in the care plan section) Acute Rehab PT Goals Patient Stated Goal: to feel better Progress towards PT goals: Progressing toward goals    Frequency    Min 2X/week      PT Plan      Co-evaluation              AM-PAC PT "6 Clicks" Mobility   Outcome Measure  Help needed turning from your back to your side while in a flat bed without using bedrails?: None Help needed moving from lying on your back to sitting on the side of a flat bed without using bedrails?: None Help needed moving to and from a bed to a chair (including a  wheelchair)?: A Little Help needed standing up from a chair using your arms (e.g., wheelchair or bedside chair)?: A Little Help needed to walk in hospital room?: A Little Help needed climbing 3-5 steps with a railing? : A Little 6 Click Score: 20    End of Session Equipment Utilized During Treatment: Gait belt Activity Tolerance: Patient tolerated treatment well Patient left: in bed;with call bell/phone within reach;with family/visitor present Nurse Communication: Mobility status PT Visit Diagnosis: Other abnormalities of gait and mobility (R26.89);Difficulty in walking, not elsewhere classified (R26.2);Muscle weakness (generalized) (M62.81)     Time: 1610-9604 PT Time Calculation (min) (ACUTE ONLY): 21 min  Charges:    $Therapeutic Activity: 8-22 mins PT General Charges $$ ACUTE PT VISIT: 1 Visit                    Melvyn Stagers, PTA  Diona Franklin 04/08/2024, 11:58 AM

## 2024-04-08 NOTE — Progress Notes (Signed)
 CC: SBO Subjective: Patient sleeping this morning. His daughter reports that he did well yesterday. He has continued to have stool and air from his ostomy. His daughter reports no episodes of nausea or vomiting. When I awoke patient he reports doing well and denies bloating, nausea and says his pain is much improved.   Objective: Vital signs in last 24 hours: Temp:  [97.4 F (36.3 C)-98.4 F (36.9 C)] 97.4 F (36.3 C) (05/01 0740) Pulse Rate:  [57-92] 57 (05/01 0740) Resp:  [16-19] 16 (05/01 0740) BP: (122-137)/(60-71) 122/60 (05/01 0740) SpO2:  [97 %-98 %] 98 % (05/01 0740) Last BM Date : 04/07/24  Intake/Output from previous day: 04/30 0701 - 05/01 0700 In: 250 [P.O.:200; IV Piggyback:50] Out: 1800 [Urine:700; Stool:1100] Intake/Output this shift: No intake/output data recorded.  Physical exam:  Abdomen is soft, slightly distended but much improved from admission, some pain in RUQ and RLQ. Ostomy in place with lots of air in bag, scant stool.   Lab Results: CBC  Recent Labs    04/07/24 0400 04/08/24 0555  WBC 12.6* 9.3  HGB 15.9 13.6  HCT 47.1 38.8*  PLT 244 180   BMET Recent Labs    04/07/24 0400 04/08/24 0555  NA 139 135  K 5.6* 4.0  CL 110 110  CO2 18* 17*  GLUCOSE 167* 100*  BUN 54* 58*  CREATININE 1.85* 1.68*  CALCIUM 9.0 8.0*   PT/INR No results for input(s): "LABPROT", "INR" in the last 72 hours. ABG No results for input(s): "PHART", "HCO3" in the last 72 hours.  Invalid input(s): "PCO2", "PO2"  Studies/Results: No results found.  Anti-infectives: Anti-infectives (From admission, onward)    Start     Dose/Rate Route Frequency Ordered Stop   04/07/24 1800  rifaximin  (XIFAXAN ) tablet 550 mg        550 mg Oral 2 times daily 04/07/24 0937     04/06/24 1457  rifaximin  (XIFAXAN ) tablet 550 mg  Status:  Discontinued        550 mg Per Tube 2 times daily 04/06/24 1457 04/07/24 0937   04/06/24 1400  rifaximin  (XIFAXAN ) tablet 550 mg  Status:   Discontinued        550 mg Per Tube 2 times daily 04/06/24 1349 04/06/24 1457       Assessment/Plan:  Patient with SBO, doing well and seems to have opened up. He tolerated CLD yesterday. Will advance to soft diet today. If tolerates this will be appropriate for discharge hopefully later today. Continue ambulation. No need for surgical follow up from our team. Surgery to sign off for now, please call with any questions or concerns.   Severa Daniels, M.D. [facility]

## 2024-04-26 MED ORDER — PANTOPRAZOLE 40 MG TABLET,DELAYED RELEASE
ORAL_TABLET | Freq: Every day | ORAL | 3 refills | 0.00000 days
Start: 2024-04-26 — End: ?

## 2024-04-27 MED ORDER — PANTOPRAZOLE 40 MG TABLET,DELAYED RELEASE
ORAL_TABLET | Freq: Every day | ORAL | 0 refills | 90.00000 days | Status: CP
Start: 2024-04-27 — End: ?

## 2024-04-29 DIAGNOSIS — C220 Liver cell carcinoma: Principal | ICD-10-CM

## 2024-04-29 NOTE — Unmapped (Signed)
 Order from for ostomy supplies completed and faxed back to [employer] [facility].

## 2024-05-04 ENCOUNTER — Inpatient Hospital Stay: Admit: 2024-05-04 | Discharge: 2024-05-04 | Payer: MEDICARE

## 2024-05-04 MED ADMIN — gadopiclenol (ELUCIREM,VUEWAY) injection 7 mL: 7 mL | INTRAVENOUS | @ 18:00:00 | Stop: 2024-05-04

## 2024-05-05 DIAGNOSIS — C220 Liver cell carcinoma: Principal | ICD-10-CM

## 2024-05-10 ENCOUNTER — Ambulatory Visit: Admit: 2024-05-10 | Discharge: 2024-05-11 | Payer: MEDICARE

## 2024-05-10 LAB — CBC W/ AUTO DIFF
BASOPHILS ABSOLUTE COUNT: 0.1 10*9/L (ref 0.0–0.1)
BASOPHILS RELATIVE PERCENT: 1.3 %
EOSINOPHILS ABSOLUTE COUNT: 0.3 10*9/L (ref 0.0–0.5)
EOSINOPHILS RELATIVE PERCENT: 4.2 %
HEMATOCRIT: 40.5 % (ref 39.0–48.0)
HEMOGLOBIN: 14.1 g/dL (ref 12.9–16.5)
LYMPHOCYTES ABSOLUTE COUNT: 1.1 10*9/L (ref 1.1–3.6)
LYMPHOCYTES RELATIVE PERCENT: 14.3 %
MEAN CORPUSCULAR HEMOGLOBIN CONC: 34.9 g/dL (ref 32.0–36.0)
MEAN CORPUSCULAR HEMOGLOBIN: 33.2 pg — ABNORMAL HIGH (ref 25.9–32.4)
MEAN CORPUSCULAR VOLUME: 95.3 fL (ref 77.6–95.7)
MEAN PLATELET VOLUME: 9.4 fL (ref 6.8–10.7)
MONOCYTES ABSOLUTE COUNT: 1 10*9/L — ABNORMAL HIGH (ref 0.3–0.8)
MONOCYTES RELATIVE PERCENT: 12.4 %
NEUTROPHILS ABSOLUTE COUNT: 5.4 10*9/L (ref 1.8–7.8)
NEUTROPHILS RELATIVE PERCENT: 67.8 %
NUCLEATED RED BLOOD CELLS: 0 /100{WBCs} (ref <=4)
PLATELET COUNT: 154 10*9/L (ref 150–450)
RED BLOOD CELL COUNT: 4.25 10*12/L — ABNORMAL LOW (ref 4.26–5.60)
RED CELL DISTRIBUTION WIDTH: 15.8 % — ABNORMAL HIGH (ref 12.2–15.2)
WBC ADJUSTED: 7.9 10*9/L (ref 3.6–11.2)

## 2024-05-10 LAB — COMPREHENSIVE METABOLIC PANEL
ALBUMIN: 3.3 g/dL — ABNORMAL LOW (ref 3.4–5.0)
ALKALINE PHOSPHATASE: 222 U/L — ABNORMAL HIGH (ref 46–116)
ALT (SGPT): 53 U/L — ABNORMAL HIGH (ref 10–49)
ANION GAP: 14 mmol/L (ref 5–14)
AST (SGOT): 64 U/L — ABNORMAL HIGH (ref <=34)
BILIRUBIN TOTAL: 0.6 mg/dL (ref 0.3–1.2)
BLOOD UREA NITROGEN: 21 mg/dL (ref 9–23)
BUN / CREAT RATIO: 20
CALCIUM: 9.1 mg/dL (ref 8.7–10.4)
CHLORIDE: 108 mmol/L — ABNORMAL HIGH (ref 98–107)
CO2: 21.4 mmol/L (ref 20.0–31.0)
CREATININE: 1.06 mg/dL (ref 0.73–1.18)
EGFR CKD-EPI (2021) MALE: 71 mL/min/1.73m2 (ref >=60)
GLUCOSE RANDOM: 161 mg/dL (ref 70–179)
POTASSIUM: 4.4 mmol/L (ref 3.4–4.8)
PROTEIN TOTAL: 7.6 g/dL (ref 5.7–8.2)
SODIUM: 143 mmol/L (ref 135–145)

## 2024-05-10 LAB — AFP TUMOR MARKER: AFP-TUMOR MARKER: 8 ng/mL (ref <=8)

## 2024-05-10 LAB — PROTIME-INR
INR: 1.07
PROTIME: 12.2 s (ref 9.9–12.6)

## 2024-05-13 NOTE — Unmapped (Signed)
 Patient Name: Thomas Rosales  Patient Age: 83 y.o.  Encounter Date: 05/14/24   Attending Interventional Radiologist: Julane Ny, MD   Resident Interventional Radiologist: N/A     Referring Physician: Gorge Laud, MD  [address]  [facility]  [place],  [place] [address]  Primary Care Provider: [name], PA      SUBJECTIVE      Reason for Visit: [facility] s/p Cryoablation     Interval Hx:   Patient has been doing well. Reported mild pain after the procedure, but now report no issues. Had an admission in late April secondary to a bowel obstruction but has not had recent issues since.     Past Medical History:  Past Medical History:   Diagnosis Date    Anal fissure 1983    Apnea, sleep     uses CPAP at night. instructed to bring DOS.     Basal cell carcinoma     Cancer       skin cancer    CHF (congestive heart failure)       Cirrhosis   04/15/2023    Crohn's disease       Diverticulitis of colon     Fatty liver     Gallstones     GERD (gastroesophageal reflux disease)     H/O echocardiogram     Hypertension     Irritable bowel syndrome     Kidney stone     NASH (nonalcoholic steatohepatitis)     Syncope     dizziness    Type 2 diabetes mellitus, without long-term current use of insulin    10/24/2022       Past Surgical History:  Past Surgical History:   Procedure Laterality Date    APPENDECTOMY      BOWEL RESECTION  1981, 1984, 1995    3 ft small intestine, <1 ft large intestines, cecum removed; 2nd bowel resection anal fistulas removed; 3rd bowel resection most of colon removed, colostomy placed.    CARDIAC CATHETERIZATION      CARDIOVASCULAR STRESS TEST      CHOLECYSTECTOMY      COLON SURGERY      Same as small bowel surgery    COLOSTOMY  1995    EYE SURGERY  1/20 & 4/21    Cataract & vitrious removal    FRACTURE SURGERY  1961    Broken tibia and fibula    HERNIA REPAIR  2019    Done by Dr Day    IR ABLATION CRYO FIBRO W US  GUIDE  03/03/2024    IR ABLATION CRYO FIBRO W US  GUIDE 03/03/2024 Julane Ny, MD IMG VIR [facility]&V [facility]    IR EMBOLIZATION ORGAN ISCHEMIA, TUMORS, INFAR  09/26/2023    IR EMBOLIZATION ORGAN ISCHEMIA, TUMORS, INFAR 09/26/2023 Julane Ny, MD IMG VIR H&V [facility]    IR EMBOLIZATION ORGAN ISCHEMIA, TUMORS, INFAR  12/16/2023    IR EMBOLIZATION ORGAN ISCHEMIA, TUMORS, INFAR 12/16/2023 Julane Ny, MD IMG VIR H&V [facility]    LAPAROSCOPIC SMALL BOWEL RESECTION      LEG SURGERY Left     pins in shin    LITHOTRIPSY      PR COLONOSCOPY FLX DX W/COLLJ SPEC WHEN PFRMD  03/19/2018    Procedure: COLONOSCOPY WITH POSSIBLE BIOPSY, POSSIBLE POLYPECTOMY.;  Surgeon: Chales Colorado, MD;  Location: ENDO OR [facility];  Service: Gastroenterology    PR COLSC FLEXIBLE W/TRANSENDOSCOPIC BALLOON DILAT N/A 09/13/2022    Procedure: COLONOSCOPY, FLEXIBLE; WITH DILATION  BY BALLOON, 1 OR MORE STRICTURES;  Surgeon: Sheria Dills Robyne Christen, MD;  Location: [facility] PROCEDURES [facility];  Service: Gastroenterology    PR INCISION & DRAINAGE ABSCESS SIMPLE/SINGLE N/A 08/11/2018    Procedure: INCISION AND DRAINAGE OF ABDOMINAL WALL ABSCESS;  Surgeon: Dewaine Footman Day, MD;  Location: OR [place];  Service: General Surgery    PR LAP, INCISIONAL HERNIA REPAIR,REDUCIBLE N/A 06/02/2018    Procedure: LAPAROSCOPIC ASSISTED PARASTOMAL HERNIA REPAIR AND INCISIONAL HERNIA REPAIR WITH MESH;  Surgeon: Dewaine Footman Day, MD;  Location: OR [place];  Service: General Surgery    PR VITRECTOMY,MECHANICAL Right 03/28/2020    Procedure: trans pars planar vitrectomy, right eye   ;  Surgeon: Mozelle Arista, MD;  Location: [facility] OR [place];  Service: Ophthalmology    PR XCAPSL CTRC RMVL INSJ IO LENS PROSTH W/O ECP Right 01/13/2019    Procedure: PHACOEMULSIFICATION OF CATARACT WITH INSERTION OF INTRAOCULAR LENS/TORIC LENS/ORA;  Surgeon: Saunders Curio, MD;  Location: OR [facility];  Service: Ophthalmology    PR XCAPSL CTRC RMVL INSJ IO LENS PROSTH W/O ECP Left 01/27/2019    Procedure: PHACOEMULSIFICATION OF CATARACT WITH INSERTION OF INTRAOCULAR LENS/TORIC LENS/ORA;  Surgeon: Saunders Curio, MD;  Location: OR [facility];  Service: Ophthalmology    RESECTION SMALL BOWEL / CLOSURE ILEOSTOMY  1981    temporary ileostomy     REVISION COLOSTOMY  2009    SMALL INTESTINE SURGERY  3x since 1984    Crohn???s disease    URETERAL STENT PLACEMENT  2008       Family History:  Family History   Problem Relation Age of Onset    Heart disease Mother         pacemaker    Arthritis Mother     Dementia Father     Cancer Father         Allergies:  No Known Allergies     OBJECTIVE     Physical Exam:  There were no vitals filed for this visit.  There is no height or weight on file to calculate BMI.    Physical exam was not performed as this visit was conducted via telehealth.    Point-of-care US  performed: Not performed    Pertinent Laboratory Values:  Lab Results   Component Value Date    WBC 7.9 05/10/2024    HGB 14.1 05/10/2024    HCT 40.5 05/10/2024    PLT 154 05/10/2024     Lab Results   Component Value Date    NA 143 05/10/2024    K 4.4 05/10/2024    PHOS 3.0 05/18/2023    MG 1.7 05/18/2023     Lab Results   Component Value Date    BUN 21 05/10/2024    CREATININE 1.06 05/10/2024     Lab Results   Component Value Date    ALBUMIN  3.3 (L) 05/10/2024     Lab Results   Component Value Date    BILIDIR 0.70 (H) 04/20/2023     Lab Results   Component Value Date    AST 64 (H) 05/10/2024    ALT 53 (H) 05/10/2024    ALKPHOS 222 (H) 05/10/2024     Lab Results   Component Value Date    INR 1.07 05/10/2024       No results found for: CA199    05/14/2024, labs were reviewed by me personally. Interpretation: Labs from 05/10/24 demonstrate satisfactory liver function with normalized AFP. This support a good response to therapy.  Pertinent Imaging Studies: 05/14/2024, imaging was visualized and reviewed by me personally. Interpretation: MRI 05/04/24 demonstrated post treatment changes with an ablation cavaty covering the region where the tumor used to reside.           ASSESSMENT/PLAN     Lemario Chaikin is a 83 y.o. male with Crohn's disease, NASH cirrhosis c/b HCC s/p TACE and now Cryoablation 03/03/2024.    Patient is doing well post ablation, with MRI and labs reassuring. Will attain repeat imaging in 3 months.     Plan:   - MRI and labs in 3 months (August 2025), with VIR clinic follow up.     Patient seen and discussed with Dr. Tanna Fanning, who agrees with the above assessment and plan. Thank you for involving us  in the care of this patient.    Julane Ny, MD      The patient reports they are physically located in [place]  and is currently: at home. I conducted a phone visit.  I spent 15 minutes on the phone call with the patient on the date of service .

## 2024-05-19 NOTE — Unmapped (Signed)
 Order from for ostomy supplies completed and faxed back to [employer] [facility].

## 2024-05-20 ENCOUNTER — Ambulatory Visit
Admit: 2024-05-20 | Discharge: 2024-05-21 | Payer: MEDICARE | Attending: Student in an Organized Health Care Education/Training Program | Primary: Student in an Organized Health Care Education/Training Program

## 2024-05-20 DIAGNOSIS — C220 Liver cell carcinoma: Principal | ICD-10-CM

## 2024-05-20 NOTE — Unmapped (Signed)
 Patient Name: Thomas Rosales  Patient Age: 83 y.o.  Encounter Date: 05/20/24   Attending Interventional Radiologist: Julane Ny, MD   Resident Interventional Radiologist: N/A     Referring Physician: Gorge Laud, MD  [address]  [facility]  [place],  [place] [address]  Primary Care Provider: [name], PA      SUBJECTIVE      Reason for Visit: [facility] s/p Cryoablation     Interval Hx:   Patient has been doing well. Reported mild pain after the procedure, but now report no issues. Had an admission in late April secondary to a bowel obstruction but has not had recent issues since.     Past Medical History:  Past Medical History:   Diagnosis Date    Anal fissure 1983    Apnea, sleep     uses CPAP at night. instructed to bring DOS.     Basal cell carcinoma     Cancer        skin cancer    CHF (congestive heart failure)        Cirrhosis    04/15/2023    Crohn's disease        Diverticulitis of colon     Fatty liver     Gallstones     GERD (gastroesophageal reflux disease)     H/O echocardiogram     Hypertension     Irritable bowel syndrome     Kidney stone     NASH (nonalcoholic steatohepatitis)     Syncope     dizziness    Type 2 diabetes mellitus, without long-term current use of insulin     10/24/2022       Past Surgical History:  Past Surgical History:   Procedure Laterality Date    APPENDECTOMY      BOWEL RESECTION  1981, 1984, 1995    3 ft small intestine, <1 ft large intestines, cecum removed; 2nd bowel resection anal fistulas removed; 3rd bowel resection most of colon removed, colostomy placed.    CARDIAC CATHETERIZATION      CARDIOVASCULAR STRESS TEST      CHOLECYSTECTOMY      COLON SURGERY      Same as small bowel surgery    COLOSTOMY  1995    EYE SURGERY  1/20 & 4/21    Cataract & vitrious removal    FRACTURE SURGERY  1961    Broken tibia and fibula    HERNIA REPAIR  2019    Done by Dr Day    IR ABLATION CRYO FIBRO W US  GUIDE  03/03/2024    IR ABLATION CRYO FIBRO W US  GUIDE 03/03/2024 Julane Ny, MD IMG VIR H&V [facility]    IR EMBOLIZATION ORGAN ISCHEMIA, TUMORS, INFAR  09/26/2023    IR EMBOLIZATION ORGAN ISCHEMIA, TUMORS, INFAR 09/26/2023 Julane Ny, MD IMG VIR H&V [facility]    IR EMBOLIZATION ORGAN ISCHEMIA, TUMORS, INFAR  12/16/2023    IR EMBOLIZATION ORGAN ISCHEMIA, TUMORS, INFAR 12/16/2023 Julane Ny, MD IMG VIR H&[facility]    LAPAROSCOPIC SMALL BOWEL RESECTION      LEG SURGERY Left     pins in shin    LITHOTRIPSY      PR COLONOSCOPY FLX DX W/COLLJ SPEC WHEN PFRMD  03/19/2018    Procedure: COLONOSCOPY WITH POSSIBLE BIOPSY, POSSIBLE POLYPECTOMY.;  Surgeon: Chales Colorado, MD;  Location: ENDO OR [facility];  Service: Gastroenterology    PR COLSC FLEXIBLE W/TRANSENDOSCOPIC BALLOON DILAT N/A 09/13/2022  Procedure: COLONOSCOPY, FLEXIBLE; WITH DILATION BY BALLOON, 1 OR MORE STRICTURES;  Surgeon: Sheria Dills Robyne Christen, MD;  Location: [facility] PROCED[facility];  Service: Gastroenterology    PR INCISION & DRAINAGE ABSCESS SIMPLE/SINGLE N/A 08/11/2018    Procedure: INCISION AND DRAINAGE OF ABDOMINAL WALL ABSCESS;  Surgeon: Dewaine Footman Day, MD;  Location: OR [place];  Service: General Surgery    PR LAP, INCISIONAL HERNIA REPAIR,REDUCIBLE N/A 06/02/2018    Procedure: LAPAROSCOPIC ASSISTED PARASTOMAL HERNIA REPAIR AND INCISIONAL HERNIA REPAIR WITH MESH;  Surgeon: Dewaine Footman Day, MD;  Location: OR [place];  Service: General Surgery    PR VITRECTOMY,MECHANICAL Right 03/28/2020    Procedure: trans pars planar vitrectomy, right eye   ;  Surgeon: Mozelle Arista, MD;  Location: [facility] OR [place];  Service: Ophthalmology    PR XCAPSL CTRC RMVL INSJ IO LENS PROSTH W/O ECP Right 01/13/2019    Procedure: PHACOEMULSIFICATION OF CATARACT WITH INSERTION OF INTRAOCULAR LENS/TORIC LENS/ORA;  Surgeon: Saunders Curio, MD;  Location: OR [facility];  Service: Ophthalmology    PR XCAPSL CTRC RMVL INSJ IO LENS PROSTH W/O ECP Left 01/27/2019    Procedure: PHACOEMULSIFICATION OF CATARACT WITH INSERTION OF INTRAOCULAR LENS/TORIC LENS/ORA;  Surgeon: Saunders Curio, MD;  Location: OR [facility];  Service: Ophthalmology    RESECTION SMALL BOWEL / CLOSURE ILEOSTOMY  1981    temporary ileostomy     REVISION COLOSTOMY  2009    SMALL INTESTINE SURGERY  3x since 1984    Crohn???s disease    URETERAL STENT PLACEMENT  2008       Family History:  Family History   Problem Relation Age of Onset    Heart disease Mother         pacemaker    Arthritis Mother     Dementia Father     Cancer Father         Allergies:  No Known Allergies     OBJECTIVE     Physical Exam:  There were no vitals filed for this visit.  There is no height or weight on file to calculate BMI.    Physical exam was not performed as this visit was conducted via telehealth.    Point-of-care US  performed: Not performed    Pertinent Laboratory Values:  Lab Results   Component Value Date    WBC 7.9 05/10/2024    HGB 14.1 05/10/2024    HCT 40.5 05/10/2024    PLT 154 05/10/2024     Lab Results   Component Value Date    NA 143 05/10/2024    K 4.4 05/10/2024    PHOS 3.0 05/18/2023    MG 1.7 05/18/2023     Lab Results   Component Value Date    BUN 21 05/10/2024    CREATININE 1.06 05/10/2024     Lab Results   Component Value Date    ALBUMIN  3.3 (L) 05/10/2024     Lab Results   Component Value Date    BILIDIR 0.70 (H) 04/20/2023     Lab Results   Component Value Date    AST 64 (H) 05/10/2024    ALT 53 (H) 05/10/2024    ALKPHOS 222 (H) 05/10/2024     Lab Results   Component Value Date    INR 1.07 05/10/2024       No results found for: CA199    05/20/2024, labs were reviewed by me personally. Interpretation: Labs from 05/10/24 demonstrate satisfactory liver function with normalized AFP. This support a good response  to therapy.    Pertinent Imaging Studies: 05/20/2024, imaging was visualized and reviewed by me personally. Interpretation: MRI 05/04/24 demonstrated post treatment changes with an ablation cavaty covering the region where the tumor used to reside.           ASSESSMENT/PLAN     Ladanian Kelter is a 83 y.o. male with Crohn's disease, NASH cirrhosis c/b HCC s/p TACE and now Cryoablation 03/03/2024.    Patient is doing well post ablation, with MRI and labs reassuring. Will attain repeat imaging in 3 months.     Plan:   - MRI and labs in 3 months (August 2025), with VIR clinic follow up.     Patient seen and discussed with Dr. Tanna Fanning, who agrees with the above assessment and plan. Thank you for involving us  in the care of this patient.    Julane Ny, MD      The patient reports they are physically located in [place]  and is currently: at home. I conducted a phone visit.  I spent 15 minutes on the phone call with the patient on the date of service .

## 2024-05-27 NOTE — Unmapped (Signed)
 Abstraction Result Flowsheet Data    This patient's last AWV date: : 04/19/2021  This patients last WCC/CPE date: : Not Found      Reason for Encounter  Reason for Encounter: Outreach  Primary Reason for Outreach: CCOO+  Text Message: No  MyChart Message: No  Outreach Call Outcome: Call Picked Up With No Response/Line Disconnected

## 2024-06-10 NOTE — Unmapped (Signed)
 Order from for ostomy supplies completed and faxed back to [employer] [facility].

## 2024-06-28 ENCOUNTER — Ambulatory Visit: Admitting: Dermatology

## 2024-06-28 DIAGNOSIS — D492 Neoplasm of unspecified behavior of bone, soft tissue, and skin: Secondary | ICD-10-CM | POA: Diagnosis not present

## 2024-06-28 DIAGNOSIS — Z85828 Personal history of other malignant neoplasm of skin: Secondary | ICD-10-CM

## 2024-06-28 DIAGNOSIS — C44622 Squamous cell carcinoma of skin of right upper limb, including shoulder: Secondary | ICD-10-CM

## 2024-06-28 DIAGNOSIS — C44219 Basal cell carcinoma of skin of left ear and external auricular canal: Secondary | ICD-10-CM

## 2024-06-28 DIAGNOSIS — C4491 Basal cell carcinoma of skin, unspecified: Secondary | ICD-10-CM

## 2024-06-28 HISTORY — DX: Basal cell carcinoma of skin, unspecified: C44.91

## 2024-06-28 NOTE — Patient Instructions (Addendum)

## 2024-06-28 NOTE — Progress Notes (Unsigned)
 Follow-Up Visit   Subjective  Dean Ryan is a 83 y.o. 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 Patient reports this place comes and goes, but has now stayed and not resolving.    The following portions of the chart were reviewed this encounter and updated as appropriate: medications, allergies, medical history  Review of Systems:  No other skin or systemic complaints except as noted in HPI or Assessment and Plan.  Objective  Well appearing patient in no apparent distress; mood and affect are within normal limits.  A focused examination was performed of the following areas: Left ear, R forearm   Relevant exam findings are noted in the Assessment and Plan.  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  - Call if any new or changing lesions are noted between office visits  NEOPLASM OF SKIN (2) Left helix Skin / nail biopsy Type of biopsy: tangential   Informed consent: discussed and consent obtained   Timeout: patient name, date of birth, surgical site, and procedure verified   Procedure prep:  Patient was prepped and draped in usual sterile fashion Prep type:  Isopropyl alcohol Anesthesia: the lesion was anesthetized in a standard fashion   Anesthetic:  1% lidocaine w/ epinephrine 1-100,000 buffered w/ 8.4% NaHCO3 Instrument used: DermaBlade   Hemostasis achieved with: pressure and aluminum chloride   Outcome: patient tolerated procedure well    Post-procedure details: sterile dressing applied and wound care instructions given   Dressing type: bandage and petrolatum    Specimen 1 - Surgical pathology Differential Diagnosis: BCC vs SCC  Check Margins: No 11 mm pink plaque with central ulceration Right forearm Skin / nail biopsy Type of biopsy: tangential   Informed consent: discussed and consent obtained   Timeout: patient name, date of birth, surgical site, and procedure verified   Procedure prep:  Patient was prepped and draped in usual sterile fashion Prep type:  Isopropyl alcohol Anesthesia: the lesion was anesthetized in a standard fashion   Anesthetic:  1% lidocaine w/ epinephrine 1-100,000 buffered w/ 8.4% NaHCO3 Instrument used: DermaBlade   Hemostasis achieved with: pressure and aluminum chloride   Outcome: patient tolerated procedure well   Post-procedure details: sterile dressing applied and wound care instructions given   Dressing type: bandage and petrolatum    Destruction of lesion Complexity: simple   Destruction method: electrodesiccation and curettage   Informed consent: discussed and consent obtained   Timeout:  patient name, date of birth, surgical site, and procedure verified Procedure prep:  Patient was prepped and draped in usual sterile fashion Prep type:  Isopropyl alcohol Anesthesia: the lesion was anesthetized in a standard fashion   Anesthetic:  1% lidocaine w/ epinephrine 1-100,000 local infiltration Curettage performed in three different directions: Yes   Electrodesiccation performed over the curetted area: Yes   Curettage cycles:  3 Final wound size (cm):  1 Hemostasis achieved with:  aluminum chloride and electrodesiccation Outcome: patient tolerated procedure well with no complications  Post-procedure details: sterile dressing applied and wound care instructions given   Dressing type: bandage and petrolatum    Specimen 2 - Surgical pathology Differential Diagnosis: SCC  Check  Margins: No 10 mm pink papule with slight hyperkeratosis EDC today If biopsy proves skin cancer at left helix will refer for Mohs.   EDC completed today at right forearm.   Return for TBSE w/ Dr. Claudene.  I, Jacquelynn V. Wilfred, CMA, am acting as scribe for Boneta Claudene, MD .   Documentation: I have reviewed the above documentation for accuracy and completeness, and I agree with the above.  Boneta Claudene, MD

## 2024-07-01 ENCOUNTER — Encounter: Payer: Self-pay | Admitting: Dermatology

## 2024-07-01 ENCOUNTER — Ambulatory Visit: Payer: Self-pay | Admitting: Dermatology

## 2024-07-01 DIAGNOSIS — C44219 Basal cell carcinoma of skin of left ear and external auricular canal: Secondary | ICD-10-CM

## 2024-07-01 LAB — SURGICAL PATHOLOGY

## 2024-07-01 NOTE — Telephone Encounter (Signed)
 Advised pt of bx results.  Discussed treatment recommendation for the [facility]    Referral for [facility] of L helix sent to Dr. Gregorio at [facility]. lex

## 2024-07-01 NOTE — Telephone Encounter (Signed)
-----   Message from [place] sent at 07/01/2024  5:08 PM EDT ----- Diagnosis: left helix :       BASAL CELL CARCINOMA, NODULAR AND INFILTRATIVE PATTERNS        2. Skin, right forearm :       WELL DIFFERENTIATED SQUAMOUS CELL CARCINOMA, BASE INVOLVED   Plan: please call  L ear BCC - Mohs R forearm SCC excision ----- Message ----- From: Interface, Lab In Three Zero One Sent: 07/01/2024   3:23 PM EDT To: Boneta Sharps, MD

## 2024-07-02 DIAGNOSIS — C44219 Basal cell carcinoma of skin of left ear and external auricular canal: Principal | ICD-10-CM

## 2024-07-16 ENCOUNTER — Emergency Department

## 2024-07-16 ENCOUNTER — Inpatient Hospital Stay
Admission: EM | Admit: 2024-07-16 | Discharge: 2024-07-18 | DRG: 386 | Disposition: A | Discharge status: Home / Self Care | Attending: Internal Medicine | Admitting: Internal Medicine

## 2024-07-16 ENCOUNTER — Other Ambulatory Visit: Payer: Self-pay

## 2024-07-16 DIAGNOSIS — N1831 Chronic kidney disease, stage 3a: Secondary | ICD-10-CM | POA: Diagnosis present

## 2024-07-16 DIAGNOSIS — K50112 Crohn's disease of large intestine with intestinal obstruction: Principal | ICD-10-CM | POA: Diagnosis present

## 2024-07-16 DIAGNOSIS — D72829 Elevated white blood cell count, unspecified: Secondary | ICD-10-CM | POA: Diagnosis present

## 2024-07-16 DIAGNOSIS — Z7969 Long term (current) use of other immunomodulators and immunosuppressants: Secondary | ICD-10-CM

## 2024-07-16 DIAGNOSIS — R109 Unspecified abdominal pain: Secondary | ICD-10-CM | POA: Diagnosis not present

## 2024-07-16 DIAGNOSIS — Z87891 Personal history of nicotine dependence: Secondary | ICD-10-CM

## 2024-07-16 DIAGNOSIS — Z79899 Other long term (current) drug therapy: Secondary | ICD-10-CM

## 2024-07-16 DIAGNOSIS — Z8249 Family history of ischemic heart disease and other diseases of the circulatory system: Secondary | ICD-10-CM

## 2024-07-16 DIAGNOSIS — Z9049 Acquired absence of other specified parts of digestive tract: Secondary | ICD-10-CM

## 2024-07-16 DIAGNOSIS — I10 Essential (primary) hypertension: Secondary | ICD-10-CM | POA: Diagnosis present

## 2024-07-16 DIAGNOSIS — Z800 Family history of malignant neoplasm of digestive organs: Secondary | ICD-10-CM

## 2024-07-16 DIAGNOSIS — Z8505 Personal history of malignant neoplasm of liver: Secondary | ICD-10-CM

## 2024-07-16 DIAGNOSIS — D84821 Immunodeficiency due to drugs: Secondary | ICD-10-CM | POA: Diagnosis present

## 2024-07-16 DIAGNOSIS — Z8349 Family history of other endocrine, nutritional and metabolic diseases: Secondary | ICD-10-CM

## 2024-07-16 DIAGNOSIS — Z933 Colostomy status: Secondary | ICD-10-CM

## 2024-07-16 DIAGNOSIS — I420 Dilated cardiomyopathy: Secondary | ICD-10-CM | POA: Diagnosis present

## 2024-07-16 DIAGNOSIS — N200 Calculus of kidney: Secondary | ICD-10-CM | POA: Diagnosis present

## 2024-07-16 DIAGNOSIS — K7460 Unspecified cirrhosis of liver: Secondary | ICD-10-CM | POA: Diagnosis present

## 2024-07-16 DIAGNOSIS — I429 Cardiomyopathy, unspecified: Secondary | ICD-10-CM | POA: Diagnosis present

## 2024-07-16 DIAGNOSIS — I5022 Chronic systolic (congestive) heart failure: Secondary | ICD-10-CM | POA: Diagnosis present

## 2024-07-16 DIAGNOSIS — I130 Hypertensive heart and chronic kidney disease with heart failure and stage 1 through stage 4 chronic kidney disease, or unspecified chronic kidney disease: Secondary | ICD-10-CM | POA: Diagnosis present

## 2024-07-16 DIAGNOSIS — K56600 Partial intestinal obstruction, unspecified as to cause: Principal | ICD-10-CM | POA: Diagnosis present

## 2024-07-16 DIAGNOSIS — Z85828 Personal history of other malignant neoplasm of skin: Secondary | ICD-10-CM

## 2024-07-16 DIAGNOSIS — I5032 Chronic diastolic (congestive) heart failure: Secondary | ICD-10-CM | POA: Diagnosis present

## 2024-07-16 DIAGNOSIS — K769 Liver disease, unspecified: Secondary | ICD-10-CM | POA: Diagnosis present

## 2024-07-16 DIAGNOSIS — K766 Portal hypertension: Secondary | ICD-10-CM | POA: Diagnosis present

## 2024-07-16 LAB — COMPREHENSIVE METABOLIC PANEL WITH GFR
ALT: 52 U/L — ABNORMAL HIGH (ref 0–44)
AST: 64 U/L — ABNORMAL HIGH (ref 15–41)
Albumin: 3.5 g/dL (ref 3.5–5.0)
Alkaline Phosphatase: 132 U/L — ABNORMAL HIGH (ref 38–126)
Anion gap: 13 (ref 5–15)
BUN: 27 mg/dL — ABNORMAL HIGH (ref 8–23)
CO2: 21 mmol/L — ABNORMAL LOW (ref 22–32)
Calcium: 9.2 mg/dL (ref 8.9–10.3)
Chloride: 104 mmol/L (ref 98–111)
Creatinine, Ser: 1.29 mg/dL — ABNORMAL HIGH (ref 0.61–1.24)
GFR, Estimated: 56 mL/min — ABNORMAL LOW (ref >60)
Glucose, Bld: 145 mg/dL — ABNORMAL HIGH (ref 70–99)
Potassium: 4.6 mmol/L (ref 3.5–5.1)
Sodium: 138 mmol/L (ref 135–145)
Total Bilirubin: 2.3 mg/dL — ABNORMAL HIGH (ref 0.0–1.2)
Total Protein: 7.8 g/dL (ref 6.5–8.1)

## 2024-07-16 LAB — CBC
HCT: 44.9 % (ref 39.0–52.0)
Hemoglobin: 15.6 g/dL (ref 13.0–17.0)
MCH: 32.9 pg (ref 26.0–34.0)
MCHC: 34.7 g/dL (ref 30.0–36.0)
MCV: 94.7 fL (ref 80.0–100.0)
Platelets: 197 K/uL (ref 150–400)
RBC: 4.74 MIL/uL (ref 4.22–5.81)
RDW: 14.1 % (ref 11.5–15.5)
WBC: 23.9 K/uL — ABNORMAL HIGH (ref 4.0–10.5)
nRBC: 0 % (ref 0.0–0.2)

## 2024-07-16 LAB — LIPASE, BLOOD: Lipase: 44 U/L (ref 11–51)

## 2024-07-16 MED ORDER — LACTATED RINGERS IV BOLUS
1000.0000 mL | Freq: Once | INTRAVENOUS | Status: AC
  Administered 2024-07-16: 1000 mL via INTRAVENOUS

## 2024-07-16 MED ORDER — MORPHINE SULFATE (PF) 4 MG/ML IV SOLN
4.0000 mg | Freq: Once | INTRAVENOUS | Status: AC
  Administered 2024-07-16: 4 mg via INTRAVENOUS
  Filled 2024-07-16: qty 1

## 2024-07-16 MED ORDER — MORPHINE SULFATE (PF) 4 MG/ML IV SOLN: 4.0000 mg | Freq: Once | INTRAVENOUS | Status: DC

## 2024-07-16 MED ORDER — SODIUM CHLORIDE 0.9 % IV BOLUS
1000.0000 mL | Freq: Once | INTRAVENOUS | Status: AC
  Administered 2024-07-16: 1000 mL via INTRAVENOUS

## 2024-07-16 MED ORDER — IOHEXOL 300 MG/ML  SOLN
100.0000 mL | Freq: Once | INTRAMUSCULAR | Status: AC | PRN
  Administered 2024-07-16: 100 mL via INTRAVENOUS

## 2024-07-16 MED ORDER — ONDANSETRON HCL 4 MG/2ML IJ SOLN: 4.0000 mg | Freq: Once | INTRAMUSCULAR | Status: DC

## 2024-07-16 MED ORDER — ONDANSETRON HCL 4 MG/2ML IJ SOLN
4.0000 mg | Freq: Once | INTRAMUSCULAR | Status: AC
  Administered 2024-07-16: 4 mg via INTRAVENOUS
  Filled 2024-07-16: qty 2

## 2024-07-16 NOTE — ED Triage Notes (Addendum)
 Pt to ED via EMS from home, pt reports hx crohns disease. Pt states he began with abd pain last night, pt reports pain is similar to when he has had bowel obstructions in the past.

## 2024-07-16 NOTE — ED Provider Notes (Signed)
 [facility] Provider Note   Event Date/Time   First MD Initiated Contact with Patient 07/16/24 2232     (approximate) History  Abdominal Pain  HPI Dean Ryan is a 83 y.o. 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  Patient states that he has had no output from his colostomy since 1800 yesterday.  Patient also has waxing and waning abdominal pain in the bilateral upper quadrants that he states is similar to obstruction pain that he has had in the past.  Patient has tried 3 doses of morphine  at home that has not helped his pain.  Patient has also had associated vomiting over the last 24 hours. ROS: Patient currently denies any vision changes, tinnitus, difficulty speaking, facial droop, sore throat, chest pain, shortness of breath,  diarrhea, dysuria, or weakness/numbness/paresthesias in any extremity   Physical Exam  Triage Vital Signs: ED Triage Vitals [07/16/24 2156]  Encounter Vitals Group     BP (!) 152/81     Girls Systolic BP Percentile      Girls Diastolic BP Percentile      Boys Systolic BP Percentile      Boys Diastolic BP Percentile      Pulse Rate 71     Resp 18     Temp 97.9 F (36.6 C)     Temp src      SpO2 100 %     Weight 160 lb (72.6 kg)     Height 5' 6 (1.676 m)     Head Circumference      Peak Flow      Pain Score 8     Pain Loc      Pain Education      Exclude from Growth Chart    Most recent vital signs: Vitals:   07/18/24 0414 07/18/24 0733  BP: (!) 125/55 127/60  Pulse: 73 66  Resp: 18 16  Temp: 98.3 F (36.8 C) 98.5 F (36.9 C)  SpO2: 96% 97%   General: Awake, oriented x4. CV:  Good peripheral perfusion. Resp:  Normal effort. Abd:  No distention.  Mild right lower quadrant tenderness to palpation Other:  Elderly overweight Caucasian male resting comfortably in no acute distress.  Ostomy in place with scant output that is  nonbloody ED Results / Procedures / Treatments  Labs (all labs ordered are listed, but only abnormal results are displayed) Labs Reviewed  COMPREHENSIVE METABOLIC PANEL WITH GFR - Abnormal; Notable for the following components:      Result Value   CO2 21 (*)    Glucose, Bld 145 (*)    BUN 27 (*)    Creatinine, Ser 1.29 (*)    AST 64 (*)    ALT 52 (*)    Alkaline Phosphatase 132 (*)    Total Bilirubin 2.3 (*)    GFR, Estimated 56 (*)    All other components within normal limits  CBC - Abnormal; Notable for the following components:   WBC 23.9 (*)    All other components within normal limits  URINALYSIS, ROUTINE W REFLEX MICROSCOPIC - Abnormal; Notable for the following components:   Color, Urine YELLOW (*)    APPearance CLEAR (*)    Specific Gravity, Urine >1.046 (*)    All other components within normal limits  PROTIME-INR - Abnormal; Notable for the following components:   Prothrombin Time 15.8 (*)    All other components  within normal limits  AMMONIA - Abnormal; Notable for the following components:   Ammonia 55 (*)    All other components within normal limits  COMPREHENSIVE METABOLIC PANEL WITH GFR - Abnormal; Notable for the following components:   CO2 19 (*)    Glucose, Bld 142 (*)    BUN 26 (*)    Calcium 8.7 (*)    Albumin 3.0 (*)    AST 59 (*)    ALT 48 (*)    Total Bilirubin 2.1 (*)    All other components within normal limits  CBC - Abnormal; Notable for the following components:   WBC 16.8 (*)    All other components within normal limits  GLUCOSE, CAPILLARY - Abnormal; Notable for the following components:   Glucose-Capillary 129 (*)    All other components within normal limits  GLUCOSE, CAPILLARY - Abnormal; Notable for the following components:   Glucose-Capillary 108 (*)    All other components within normal limits  LIPASE, BLOOD  BRAIN NATRIURETIC PEPTIDE  APTT  PROCALCITONIN   RADIOLOGY ED MD interpretation: Pending CT of the abdomen and  pelvis - All radiology independently interpreted and agree with radiology assessment Official radiology report(s): No results found. PROCEDURES: Critical Care performed: No Procedures MEDICATIONS ORDERED IN ED: Medications  morphine  (PF) 4 MG/ML injection 4 mg (4 mg Intravenous Given 07/16/24 2242)  ondansetron  (ZOFRAN ) injection 4 mg (4 mg Intravenous Given 07/16/24 2242)  sodium chloride  0.9 % bolus 1,000 mL (0 mLs Intravenous Stopped 07/17/24 0139)  iohexol  (OMNIPAQUE ) 300 MG/ML solution 100 mL (100 mLs Intravenous Contrast Given 07/16/24 2324)  lactated ringers  bolus 1,000 mL (0 mLs Intravenous Stopped 07/17/24 0139)  morphine  (PF) 2 MG/ML injection 2 mg (2 mg Intravenous Given 07/17/24 0355)   IMPRESSION / MDM / ASSESSMENT AND PLAN / ED COURSE  I reviewed the triage vital signs and the nursing notes.                             The patient is on the cardiac monitor to evaluate for evidence of arrhythmia and/or significant heart rate changes. Patient's presentation is most consistent with acute presentation with potential threat to life or bodily function. Given History, Exam I believe patient needs labs and imaging to evaluate for SBO vs other acute abdomen. ED Workup: CBC, BMP, LFTs, CT Abdomen/Pelvis ED Findings: CT: Small Bowel Obstruction  Care of this patient will be signed out to the oncoming physician at the end of my shift.  All pertinent patient information conveyed and all questions answered.  All further care and disposition decisions will be made by the oncoming physician.   FINAL CLINICAL IMPRESSION(S) / ED DIAGNOSES   Final diagnoses:  Partial small bowel obstruction (HCC)   Rx / DC Orders   ED Discharge Orders          Ordered    Increase activity slowly        07/18/24 1420    Diet - low sodium heart healthy        07/18/24 1420    Diet full liquid       Comments: Advance to soft as tolerated   07/18/24 1420           Note:  This document was prepared  using Dragon voice recognition software and may include unintentional dictation errors.   Jossie Artist POUR, MD 07/22/24 [phone]

## 2024-07-16 NOTE — ED Triage Notes (Signed)
 First RN Note: pt to ED via [facility] with c/o poss bowel obstruction. Per EMS pt reported no output in his colostomy since 1800 yesterday 8/7 with hx of bowel obstruction. Per EMS pt with hx of Crohns and liver cancer. Per EMS pt reports pain 8/10, has had 3 doses of home pain meds prior to arrival.    20G L AC--> 300cc's LR given en route   158/70 80HR 98% RA CBG 140 12 Lead-LBBB

## 2024-07-17 ENCOUNTER — Encounter: Payer: Self-pay | Admitting: Internal Medicine

## 2024-07-17 ENCOUNTER — Inpatient Hospital Stay

## 2024-07-17 DIAGNOSIS — K50112 Crohn's disease of large intestine with intestinal obstruction: Secondary | ICD-10-CM | POA: Diagnosis present

## 2024-07-17 DIAGNOSIS — D84821 Immunodeficiency due to drugs: Secondary | ICD-10-CM | POA: Diagnosis present

## 2024-07-17 DIAGNOSIS — I130 Hypertensive heart and chronic kidney disease with heart failure and stage 1 through stage 4 chronic kidney disease, or unspecified chronic kidney disease: Secondary | ICD-10-CM | POA: Diagnosis present

## 2024-07-17 DIAGNOSIS — I10 Essential (primary) hypertension: Secondary | ICD-10-CM

## 2024-07-17 DIAGNOSIS — N1831 Chronic kidney disease, stage 3a: Secondary | ICD-10-CM

## 2024-07-17 DIAGNOSIS — Z8249 Family history of ischemic heart disease and other diseases of the circulatory system: Secondary | ICD-10-CM | POA: Diagnosis not present

## 2024-07-17 DIAGNOSIS — I420 Dilated cardiomyopathy: Secondary | ICD-10-CM | POA: Diagnosis present

## 2024-07-17 DIAGNOSIS — R109 Unspecified abdominal pain: Secondary | ICD-10-CM | POA: Diagnosis present

## 2024-07-17 DIAGNOSIS — K56600 Partial intestinal obstruction, unspecified as to cause: Principal | ICD-10-CM | POA: Diagnosis present

## 2024-07-17 DIAGNOSIS — Z8349 Family history of other endocrine, nutritional and metabolic diseases: Secondary | ICD-10-CM | POA: Diagnosis not present

## 2024-07-17 DIAGNOSIS — K7460 Unspecified cirrhosis of liver: Secondary | ICD-10-CM

## 2024-07-17 DIAGNOSIS — I5032 Chronic diastolic (congestive) heart failure: Secondary | ICD-10-CM | POA: Diagnosis not present

## 2024-07-17 DIAGNOSIS — K766 Portal hypertension: Secondary | ICD-10-CM | POA: Diagnosis present

## 2024-07-17 DIAGNOSIS — Z79899 Other long term (current) drug therapy: Secondary | ICD-10-CM | POA: Diagnosis not present

## 2024-07-17 DIAGNOSIS — Z8505 Personal history of malignant neoplasm of liver: Secondary | ICD-10-CM | POA: Diagnosis not present

## 2024-07-17 DIAGNOSIS — Z800 Family history of malignant neoplasm of digestive organs: Secondary | ICD-10-CM | POA: Diagnosis not present

## 2024-07-17 DIAGNOSIS — I429 Cardiomyopathy, unspecified: Secondary | ICD-10-CM | POA: Diagnosis present

## 2024-07-17 DIAGNOSIS — K769 Liver disease, unspecified: Secondary | ICD-10-CM | POA: Diagnosis present

## 2024-07-17 DIAGNOSIS — D72829 Elevated white blood cell count, unspecified: Secondary | ICD-10-CM | POA: Diagnosis not present

## 2024-07-17 DIAGNOSIS — Z87891 Personal history of nicotine dependence: Secondary | ICD-10-CM | POA: Diagnosis not present

## 2024-07-17 DIAGNOSIS — Z933 Colostomy status: Secondary | ICD-10-CM | POA: Diagnosis not present

## 2024-07-17 DIAGNOSIS — Z85828 Personal history of other malignant neoplasm of skin: Secondary | ICD-10-CM | POA: Diagnosis not present

## 2024-07-17 DIAGNOSIS — Z9049 Acquired absence of other specified parts of digestive tract: Secondary | ICD-10-CM | POA: Diagnosis not present

## 2024-07-17 DIAGNOSIS — Z7969 Long term (current) use of other immunomodulators and immunosuppressants: Secondary | ICD-10-CM | POA: Diagnosis not present

## 2024-07-17 DIAGNOSIS — N200 Calculus of kidney: Secondary | ICD-10-CM | POA: Diagnosis present

## 2024-07-17 DIAGNOSIS — I5022 Chronic systolic (congestive) heart failure: Secondary | ICD-10-CM | POA: Diagnosis present

## 2024-07-17 LAB — CBC
HCT: 43.6 % (ref 39.0–52.0)
Hemoglobin: 15 g/dL (ref 13.0–17.0)
MCH: 32.4 pg (ref 26.0–34.0)
MCHC: 34.4 g/dL (ref 30.0–36.0)
MCV: 94.2 fL (ref 80.0–100.0)
Platelets: 187 K/uL (ref 150–400)
RBC: 4.63 MIL/uL (ref 4.22–5.81)
RDW: 13.9 % (ref 11.5–15.5)
WBC: 16.8 K/uL — ABNORMAL HIGH (ref 4.0–10.5)
nRBC: 0 % (ref 0.0–0.2)

## 2024-07-17 LAB — BRAIN NATRIURETIC PEPTIDE: B Natriuretic Peptide: 41.1 pg/mL (ref 0.0–100.0)

## 2024-07-17 LAB — PROTIME-INR
INR: 1.2 (ref 0.8–1.2)
Prothrombin Time: 15.8 s — ABNORMAL HIGH (ref 11.4–15.2)

## 2024-07-17 LAB — URINALYSIS, ROUTINE W REFLEX MICROSCOPIC
Bilirubin Urine: NEGATIVE
Glucose, UA: NEGATIVE mg/dL
Hgb urine dipstick: NEGATIVE
Ketones, ur: NEGATIVE mg/dL
Leukocytes,Ua: NEGATIVE
Nitrite: NEGATIVE
Protein, ur: NEGATIVE mg/dL
Specific Gravity, Urine: >1.046 — ABNORMAL HIGH (ref 1.005–1.030)
pH: 5 (ref 5.0–8.0)

## 2024-07-17 LAB — COMPREHENSIVE METABOLIC PANEL WITH GFR
ALT: 48 U/L — ABNORMAL HIGH (ref 0–44)
AST: 59 U/L — ABNORMAL HIGH (ref 15–41)
Albumin: 3 g/dL — ABNORMAL LOW (ref 3.5–5.0)
Alkaline Phosphatase: 112 U/L (ref 38–126)
Anion gap: 10 (ref 5–15)
BUN: 26 mg/dL — ABNORMAL HIGH (ref 8–23)
CO2: 19 mmol/L — ABNORMAL LOW (ref 22–32)
Calcium: 8.7 mg/dL — ABNORMAL LOW (ref 8.9–10.3)
Chloride: 109 mmol/L (ref 98–111)
Creatinine, Ser: 1.08 mg/dL (ref 0.61–1.24)
GFR, Estimated: >60 mL/min (ref >60)
Glucose, Bld: 142 mg/dL — ABNORMAL HIGH (ref 70–99)
Potassium: 4.7 mmol/L (ref 3.5–5.1)
Sodium: 138 mmol/L (ref 135–145)
Total Bilirubin: 2.1 mg/dL — ABNORMAL HIGH (ref 0.0–1.2)
Total Protein: 7.3 g/dL (ref 6.5–8.1)

## 2024-07-17 LAB — APTT: aPTT: 33 s (ref 24–36)

## 2024-07-17 LAB — PROCALCITONIN: Procalcitonin: 0.17 ng/mL

## 2024-07-17 LAB — AMMONIA: Ammonia: 55 umol/L — ABNORMAL HIGH (ref 9–35)

## 2024-07-17 LAB — GLUCOSE, CAPILLARY: Glucose-Capillary: 129 mg/dL — ABNORMAL HIGH (ref 70–99)

## 2024-07-17 MED ORDER — HEPARIN SODIUM (PORCINE) 5000 UNIT/ML IJ SOLN
5000.0000 [IU] | Freq: Three times a day (TID) | INTRAMUSCULAR | Status: DC
  Administered 2024-07-17 – 2024-07-18 (×3): 5000 [IU] via SUBCUTANEOUS
  Filled 2024-07-17 (×5): qty 1

## 2024-07-17 MED ORDER — SODIUM CHLORIDE 0.9 % IV SOLN: INTRAVENOUS | Status: DC

## 2024-07-17 MED ORDER — LORATADINE 10 MG PO TABS
10.0000 mg | ORAL_TABLET | Freq: Every day | ORAL | Status: DC
  Filled 2024-07-17 (×2): qty 1

## 2024-07-17 MED ORDER — ONDANSETRON HCL 4 MG/2ML IJ SOLN
4.0000 mg | Freq: Three times a day (TID) | INTRAMUSCULAR | Status: DC | PRN
  Administered 2024-07-17: 4 mg via INTRAVENOUS
  Filled 2024-07-17: qty 2

## 2024-07-17 MED ORDER — MORPHINE SULFATE (PF) 2 MG/ML IV SOLN
2.0000 mg | Freq: Once | INTRAVENOUS | Status: AC
  Administered 2024-07-17: 2 mg via INTRAVENOUS
  Filled 2024-07-17: qty 1

## 2024-07-17 MED ORDER — PANTOPRAZOLE SODIUM 40 MG IV SOLR
40.0000 mg | Freq: Two times a day (BID) | INTRAVENOUS | Status: DC
  Administered 2024-07-17 – 2024-07-18 (×3): 40 mg via INTRAVENOUS
  Filled 2024-07-17 (×3): qty 10

## 2024-07-17 MED ORDER — PROSIGHT PO TABS
1.0000 | ORAL_TABLET | Freq: Every day | ORAL | Status: DC
  Filled 2024-07-17 (×2): qty 1

## 2024-07-17 MED ORDER — PANTOPRAZOLE SODIUM 40 MG PO TBEC
40.0000 mg | DELAYED_RELEASE_TABLET | Freq: Every day | ORAL | Status: DC
  Filled 2024-07-17: qty 1

## 2024-07-17 MED ORDER — ACETAMINOPHEN 325 MG PO TABS: 325.0000 mg | ORAL_TABLET | Freq: Four times a day (QID) | ORAL | Status: DC | PRN

## 2024-07-17 MED ORDER — HYDRALAZINE HCL 20 MG/ML IJ SOLN: 5.0000 mg | INTRAMUSCULAR | Status: DC | PRN

## 2024-07-17 MED ORDER — OXYCODONE HCL 5 MG PO TABS: 5.0000 mg | ORAL_TABLET | Freq: Four times a day (QID) | ORAL | Status: DC | PRN

## 2024-07-17 MED ORDER — SODIUM CHLORIDE 0.9 % IV SOLN
2.0000 g | INTRAVENOUS | Status: DC
  Administered 2024-07-17: 2 g via INTRAVENOUS
  Filled 2024-07-17: qty 20

## 2024-07-17 MED ORDER — MORPHINE SULFATE (PF) 2 MG/ML IV SOLN
1.0000 mg | INTRAVENOUS | Status: DC | PRN
  Administered 2024-07-17: 2 mg via INTRAVENOUS
  Filled 2024-07-17: qty 1

## 2024-07-17 NOTE — Consult Note (Addendum)
**Dean Ryan [name] via Obfuscation**  [facility] Dean Ryan Dean Ryan Ryan (initial) - cpt: [id number]   HISTORY OF PRESENT ILLNESS (HPI):  83 y.o. male presented to [facility] ED yesterday for evaluation of decreased colostomy output, associated abdominal pain, nausea and vomiting. Patient reports minimal to no output from his colostomy since 6 PM the day prior to his admission, August 7.  Emesis was nonbilious, hematemesis denied.  Pain seems to be waxing and waning. NG output placed following CT scan, with 1.1 L of output: Not as much since.  Only mildly improved since admission.  He reports he has been told he is not to have further surgery, and I reviewed the fact that he has mesh placed for incisional hernia and for peristomal hernia.  His abdomen is benign and minimally distended.  Small amount of ostomy output.  Surgery is consulted by hospitalist physician Dr. Hilma in this context for evaluation and management of pSBO. 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   PAST MEDICAL HISTORY (PMH):  Past Medical History:  Diagnosis Date   Basal cell carcinoma 06/28/2024   Left helix, referral sent to Dr. Gregorio for mohs   Crohn disease [facility])    Liver cancer (HCC)    Neoplasm of skin with sebaceous differentiation 09/19/2022   SEBACEOUS NEOPLASM WITH ATYPICAL FEATURES, Patient saw Dr. Gregorio for scheduled Mohs 10/08/22. Per Dr. Rayvon Dean Ryan Ryan the sebaceous neoplasm had resolved with biopsy and they were to reassess 3 months later.   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 SCC-KA type. [facility] 02/20/2023   Squamous cell carcinoma of skin 09/01/2023   Right forearm. WD SCC. 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 Authorizing Provider  loratadine  (CLARITIN ) 10 MG tablet Take 10 mg by mouth daily.    Yes [provider]  Magnesium Oxide (MAG-200 PO) Take 1 tablet by mouth in the morning and at bedtime. 12/27/21  Yes [provider]  Multiple Vitamins-Minerals (PRESERVISION AREDS 2 PO) Take 1 tablet by mouth daily.   Yes [provider]  pantoprazole  (PROTONIX ) 40 MG tablet Take 40 mg by mouth daily. 04/08/18  Yes [provider]  spironolactone  (ALDACTONE ) 50 MG tablet Take 50 mg by mouth daily. 07/09/24  Yes [provider]  Ustekinumab MARGARIE) 45 MG/0.5ML SOLN 90mg  subcutaneous every 8weeks 12/21/20  Yes [provider]  XIFAXAN  550 MG TABS tablet Take 550 mg by mouth 2 (two) times daily. 11/28/22  Yes [provider]  mupirocin  ointment (BACTROBAN ) 2 % Apply 1 Application topically daily. 10/08/23   Claudene Lehmann, MD     ALLERGIES:  No Known Allergies   SOCIAL HISTORY:  Social History   Socioeconomic History   Marital status: Married    Spouse name: Not on file  Number of children: Not on file   Years of education: Not on file   Highest education level: Not on file  Occupational History   Not on file  Tobacco Use   Smoking status: Former    Current packs/day: 0.00    Types: Cigarettes    Quit date: 12    Years since quitting: 40.6   Smokeless tobacco: Never  Substance and Sexual Activity   Alcohol use: Yes   Drug use: Not Currently   Sexual activity: Not  Currently  Other Topics Concern   Not on file  Social History Narrative   Not on file   Social Drivers of Health   Financial Resource Strain: Low Risk  (04/14/2023)   Received from [facility]   Overall Financial Resource Strain (CARDIA)    Difficulty of Paying Living Expenses: Not very hard  Food Insecurity: No Food Insecurity (07/17/2024)   Hunger Vital Sign    Worried About Running Out of Food in the Last Year: Never true    Ran Out of Food in the Last Year: Never true  Transportation Needs: No Transportation Needs (07/17/2024)   PRAPARE - Administrator, Civil Service (Medical): No    Lack of Transportation (Non-Medical): No  Physical Activity: Inactive (04/19/2021)   Received from [facility]   Exercise Vital Sign    On average, how many days per week do you engage in moderate to strenuous exercise (like a brisk walk)?: 0 days    On average, how many minutes do you engage in exercise at this level?: 0 min  Stress: No Stress Concern Present (04/19/2021)   Received from [facility] - Occupational Stress Questionnaire    Feeling of Stress : Not at all  Social Connections: Unknown (07/17/2024)   Social Connection and Isolation Panel    Frequency of Communication with Friends and Family: More than three times a week    Frequency of Social Gatherings with Friends and Family: More than three times a week    Attends Religious Services: Patient declined    Database administrator or Organizations: Patient declined    Attends Banker Meetings: Patient declined    Marital Status: Married  Catering manager Violence: Not At Risk (07/17/2024)   Humiliation, Afraid, Rape, and Kick questionnaire    Fear of Current or Ex-Partner: No    Emotionally Abused: No    Physically Abused: No    Sexually Abused: No     FAMILY HISTORY:  Family History  Problem Relation Age of Onset   Hyperlipidemia Mother    Hypertension  Mother    Colon cancer Father       REVIEW OF SYSTEMS:  Review of Systems  All other systems reviewed and are negative.   VITAL SIGNS:  Temp:  [97.9 F (36.6 C)-98.6 F (37 C)] 98.1 F (36.7 C) (08/09 0812) Pulse Rate:  [69-92] 85 (08/09 0812) Resp:  [17-22] 18 (08/09 0812) BP: (125-152)/(60-81) 125/64 (08/09 0812) SpO2:  [95 %-100 %] 95 % (08/09 0812) Weight:  [72.6 kg-75 kg] 75 kg (08/09 0400)     Height: 5' 6 (167.6 cm) Weight: 75 kg BMI (Calculated): 26.7   INTAKE/OUTPUT:  08/08 0701 - 08/09 0700 In: 2278.2 [I.V.:174.8; IV Piggyback:2103.3] Out: 1100 [Emesis/NG output:1100]  PHYSICAL EXAM:  Physical Exam Blood pressure 125/64, pulse 85, temperature 98.1 F (36.7 C), resp. rate 18, height 5' 6 (1.676 m), weight 75  kg, SpO2 95%. Last Weight  Most recent update: 07/17/2024  4:18 AM    Weight  75 kg (165 lb 5.5 oz)             CONSTITUTIONAL: Well developed, and nourished, appropriately responsive and aware without distress.   EYES: Sclera non-icteric.   EARS, NOSE, MOUTH AND THROAT:  The oropharynx is clear. Oral mucosa is pink and moist.    Hearing is intact to voice.  NECK: Trachea is midline, and there is no jugular venous distension.  LYMPH NODES:  Lymph nodes in the neck are not enlarged. RESPIRATORY:   Normal respiratory effort without pathologic use of accessory muscles. CARDIOVASCULAR:  Well perfused.  GI: The abdomen is soft, minimally tender, and mildly distended. There were no palpable masses. IMUSCULOSKELETAL:  Warm without edema.  SKIN: Skin turgor is normal. No pathologic skin lesions appreciated.  NEUROLOGIC:  Motor and sensation appear grossly normal.  Cranial nerves are grossly without defect. PSYCH:  Alert and oriented to person, place and time. Affect is appropriate for situation.  Data Reviewed I have personally reviewed what is currently available of the patient's imaging, recent labs and medical records.    Labs:     Latest Ref Rng &  Units 07/17/2024    5:11 AM 07/16/2024    9:57 PM 04/08/2024    5:55 AM  CBC  WBC 4.0 - 10.5 K/uL 16.8  23.9  9.3   Hemoglobin 13.0 - 17.0 g/dL 84.9  84.3  86.3   Hematocrit 39.0 - 52.0 % 43.6  44.9  38.8   Platelets 150 - 400 K/uL 187  197  180       Latest Ref Rng & Units 07/17/2024    5:11 AM 07/16/2024    9:57 PM 04/08/2024    5:55 AM  CMP  Glucose 70 - 99 mg/dL 857  854  899   BUN 8 - 23 mg/dL 26  27  58   Creatinine 0.61 - 1.24 mg/dL 8.91  8.70  8.31   Sodium 135 - 145 mmol/L 138  138  135   Potassium 3.5 - 5.1 mmol/L 4.7  4.6  4.0   Chloride 98 - 111 mmol/L 109  104  110   CO2 22 - 32 mmol/L 19  21  17    Calcium 8.9 - 10.3 mg/dL 8.7  9.2  8.0   Total Protein 6.5 - 8.1 g/dL 7.3  7.8  6.8   Total Bilirubin 0.0 - 1.2 mg/dL 2.1  2.3  1.9   Alkaline Phos 38 - 126 U/L 112  132  97   AST 15 - 41 U/L 59  64  31   ALT 0 - 44 U/L 48  52  36      Imaging studies:   Last 24 hrs: DG Abd Portable 1 View Result Date: 07/17/2024 CLINICAL DATA:  Check gastric catheter placement EXAM: PORTABLE ABDOMEN - 1 VIEW COMPARISON:  None Available. FINDINGS: Gastric catheter is noted in the stomach. Scattered large and small bowel gas is seen. No free air is noted. IMPRESSION: Gastric catheter within the stomach. Electronically Signed   By: Oneil Devonshire M.D.   On: 07/17/2024 02:07   CT ABDOMEN PELVIS W CONTRAST Result Date: 07/17/2024 CLINICAL DATA:  Acute abdominal pain and decreased output from colostomy for 1 day EXAM: CT ABDOMEN AND PELVIS WITH CONTRAST TECHNIQUE: Multidetector CT imaging of the abdomen and pelvis was performed using the standard protocol following bolus administration of intravenous  contrast. RADIATION DOSE REDUCTION: This exam was performed according to the departmental dose-optimization program which includes automated exposure control, adjustment of the mA and/or kV according to patient size and/or use of iterative reconstruction technique. CONTRAST:  100 mL OMNIPAQUE  IOHEXOL  300 MG/ML   SOLN COMPARISON:  04/06/2024 FINDINGS: Lower chest: Scattered calcified granulomas are noted throughout both lung bases. Hepatobiliary: Scattered hypodensities are again seen in the liver unchanged from the prior exam. No new focal lesions are seen. Previously treated lesion within the right lobe of the liver is again seen but decreased in size now measuring 3.1 by 1.9 cm. Gallbladder has been surgically removed. Prominence of biliary tree is noted. Pancreas: Unremarkable. No pancreatic ductal dilatation or surrounding inflammatory changes. Spleen: Normal in size without focal abnormality. Adrenals/Urinary Tract: Adrenal glands are within normal limits. Kidneys demonstrate diffuse cortical thinning and cystic change similar to that noted on prior exam. Nonobstructing right renal stones are noted measuring up to 16 mm. Similar changes are noted on the right measuring up to 13 mm. No obstructive changes are seen. The bladder is decompressed. Stomach/Bowel: Loop colostomy in the left mid abdomen is noted. Diverticular change of the colon is seen. Postsurgical changes in the right colon are seen. No colic anastomosis appears within normal limits. Stomach is unremarkable. Mildly dilated loops of small bowel are noted throughout the abdomen. Fecalization of bowel contents is noted and there is a focal transition zone seen on image number 54 of series 2. These changes are likely related to adhesions. Multiple loops of small bowel are matted in the anterior abdomen consistent with the prior Dean Ryan history. Vascular/Lymphatic: Aortic atherosclerosis. No enlarged abdominal or pelvic lymph nodes. Reproductive: Prostate is unremarkable. Other: No abdominal wall hernia or abnormality. No abdominopelvic ascites. Musculoskeletal: No acute or significant osseous findings. IMPRESSION: Changes consistent with early partial small bowel obstruction with a transition zone in the mid abdomen likely related to adhesions. Bilateral renal  calculi without obstructive change as described. Left-sided loop colostomy. Decrease in size of previously treated liver lesion. Multiple smaller hepatic hypodensities are noted which are not well characterized on this exam. They are however stable from the prior study. Electronically Signed   By: Oneil Devonshire M.D.   On: 07/17/2024 00:01     Assessment/Plan:  83 y.o. male with partial small bowel obstruction, seemingly very recurrent, every 3 to 6 months, often manages at home with minimal oral intake and avoiding solid foods.  Long history of Crohn's disease starting from the age of 32.  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   Leukocytosis 04/06/2024   Hepatocellular carcinoma (HCC) 08/14/2023   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  - Continue NG tube to low wall suction, strict management of [place] sump.  - Maintain IV hydration and electrolyte balance.  - DVT  prophylaxis, PPI prophylaxis, we will follow this gentleman with you.  All of the above findings and recommendations were discussed with the patient and  family(if present), and all of patient's and present family's questions were answered to their expressed satisfaction.  I personally spent a total of 60 minutes in the care of the patient today including preparing to see the patient, getting/reviewing separately obtained history, performing a medically appropriate exam/evaluation, placing orders, documenting clinical information in the EHR, and independently interpreting results.    Thank you for the opportunity to participate in this patient's care.   -- [name], M.D., FACS 07/17/2024, 9:50 AM

## 2024-07-17 NOTE — ED Notes (Signed)
 Pt requesting not to be in a shared room due to colostomy and NG tube d/t smell. AC notified and bed changed to room 155.   Recliner and blankets given to family for comfort.

## 2024-07-17 NOTE — H&P (Addendum)
 History and Physical    WISSAM RESOR FMW:[id number] DOB: Mar 30, 1941 DOA: 07/16/2024  Referring MD/NP/PA:   PCP: Wendy Norleen Sieving, PA-C   Patient coming from:  The patient is coming from home.     Chief Complaint:   HPI: Dean Ryan is a 83 y.o. 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  Patient states that he noticed decreased (almost no) output from his colostomy since 1800 yesterday.  Associated with nausea and multiple episode of nonbilious nonbloody vomiting and abdominal pain.  His abdominal pain is diffused, aching, initially severe, currently subsided after taking morphine  at home, nonradiating.  No fever or chills.  Patient does not have chest pain, cough, SOB.   Data reviewed independently and ED Course: pt was found to have WBC 23.9 (7.9 on 05/10/2024), slightly worsening renal function, temperature normal, blood pressure 142/69, heart rate 73, RR 18, oxygen saturation 95% on room air.  Patient is admitted to telemetry bed as inpatient.  Consulted Dr. Lane of surgery.   CT abdomen/pelvis: Changes consistent with early partial small bowel obstruction with a transition zone in the mid abdomen likely related to adhesions.   Bilateral renal calculi without obstructive change as described.   Left-sided loop colostomy.   Decrease in size of previously treated liver lesion. Multiple smaller hepatic hypodensities are noted which are not well characterized on this exam. They are however stable from the prior study.     EKG:  Not done in ED, will get one.   Review of Systems:   General: no fevers, chills, no body weight gain, has poor  appetite, has fatigue HEENT: no blurry vision, hearing changes or sore throat Respiratory: no dyspnea, coughing, wheezing CV: no chest pain, no palpitations GI: has nausea, vomiting, abdominal pain, decreased output from colostomy GU: no dysuria, burning on urination, increased urinary frequency, hematuria  Ext: has leg edema Neuro: no unilateral weakness, numbness, or tingling, no vision change or hearing loss Skin: no rash, no skin tear. MSK: No muscle spasm, no deformity, no limitation of range of movement in spin Heme: No easy bruising.  Travel history: No recent long distant travel.   Allergy: No Known Allergies  Past Medical History:  Diagnosis Date   Basal cell carcinoma 06/28/2024   Left helix, referral sent to Dr. Gregorio for mohs   Crohn disease [facility])    Liver cancer (HCC)    Neoplasm of skin with sebaceous differentiation 09/19/2022   SEBACEOUS NEOPLASM WITH ATYPICAL FEATURES, Patient saw Dr. Gregorio for scheduled Mohs 10/08/22. Per Dr. Rayvon note the sebaceous neoplasm had resolved with biopsy and they were to reassess 3 months later.   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 SCC-KA type. [facility] 02/20/2023   Squamous cell carcinoma of skin 09/01/2023   Right forearm. WD SCC. 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 His smoking use included cigarettes. He has never used smokeless tobacco. He reports current alcohol use. He reports that he does not currently use drugs.  Family History:  Family History   Problem Relation Age of Onset   Hyperlipidemia Mother    Hypertension Mother    Colon cancer Father      Prior to Admission medications   Medication Sig Start Date End Date Taking? Authorizing Provider  loratadine  (CLARITIN ) 10 MG tablet Take 10 mg by mouth daily.     [provider]  Magnesium Oxide (MAG-200 PO) Take 1 tablet by mouth in the morning and at bedtime. 12/27/21   [provider]  Multiple Vitamins-Minerals (PRESERVISION AREDS 2 PO) Take 1 tablet by mouth daily.    [provider]  mupirocin  ointment (BACTROBAN ) 2 % Apply 1 Application topically daily. 10/08/23   Claudene Lehmann, MD  pantoprazole  (PROTONIX ) 40 MG tablet Take 40 mg by mouth daily. 04/08/18   [provider]  Ustekinumab MARGARIE) 45 MG/0.5ML SOLN 90mg  subcutaneous every 8weeks 12/21/20   [provider]  XIFAXAN  550 MG TABS tablet Take 550 mg by mouth 2 (two) times daily. 11/28/22   [provider]    Physical Exam: Vitals:   07/16/24 2156 07/16/24 2345 07/17/24 0000  BP: (!) 152/81 (!) 149/66 (!) 142/69  Pulse: 71 69 73  Resp: 18    Temp: 97.9 F (36.6 C)    SpO2: 100% 99% 95%  Weight: 72.6 kg    Height: 5' 6 (1.676 m)     General: Not in acute distress HEENT:       Eyes: PERRL, EOMI, no jaundice       ENT: No discharge from the ears and nose, no pharynx injection, no tonsillar enlargement.        Neck: No JVD, no bruit, no mass felt. Heme: No neck lymph node enlargement. Cardiac: S1/S2, RRR, No murmurs, No gallops or rubs. Respiratory: No rales, wheezing, rhonchi or rubs. GI: has diffuse tenderness, no rebound pain, no organomegaly, BS present. GU: No hematuria Ext: 1+ pitting leg edema bilaterally. 1+DP/PT pulse bilaterally. Musculoskeletal: No joint deformities, No joint redness or warmth, no limitation of ROM in spin. Skin: No rashes.  Neuro: Alert, oriented X3, cranial nerves II-XII grossly intact, moves all extremities normally.   Psych: Patient is not psychotic, no suicidal or hemocidal ideation.  Labs on Admission: I have personally reviewed following labs and imaging studies  CBC: Recent Labs  Lab 07/16/24 2157  WBC 23.9*  HGB 15.6  HCT 44.9  MCV 94.7  PLT 197   Basic Metabolic Panel: Recent Labs  Lab 07/16/24 2157  NA 138  K 4.6  CL 104  CO2 21*  GLUCOSE 145*  BUN 27*  CREATININE 1.29*  CALCIUM 9.2   GFR: Estimated Creatinine Clearance: 40.5 mL/min (A) (by C-G formula based on SCr of 1.29 mg/dL (H)). Liver Function Tests: Recent Labs  Lab 07/16/24 2157  AST 64*  ALT 52*  ALKPHOS 132*  BILITOT 2.3*  PROT 7.8  ALBUMIN 3.5   Recent Labs  Lab 07/16/24 2157  LIPASE 44   No results for input(s): AMMONIA in the last 168 hours. Coagulation Profile: No results for input(s): INR, PROTIME in the last 168 hours. Cardiac Enzymes: No results for input(s): CKTOTAL, CKMB, CKMBINDEX, TROPONINI in the last 168 hours. BNP (last 3 results) No results for  input(s): PROBNP in the last 8760 hours. HbA1C: No results for input(s): HGBA1C in the last 72 hours. CBG: No results for input(s): GLUCAP in the last 168 hours. Lipid Profile: No results for input(s): CHOL, HDL, LDLCALC, TRIG, CHOLHDL, LDLDIRECT in the last 72 hours. Thyroid Function Tests: No results for input(s): TSH, T4TOTAL, FREET4, T3FREE, THYROIDAB in the last 72 hours. Anemia Panel: No results for input(s): VITAMINB12, FOLATE, FERRITIN, TIBC, IRON, RETICCTPCT in the last 72 hours. Urine analysis:    Component Value Date/Time   COLORURINE YELLOW (A) 05/04/2022 1504   APPEARANCEUR HAZY (A) 05/04/2022 1504   LABSPEC 1.028 05/04/2022 1504   PHURINE 5.0 05/04/2022 1504   GLUCOSEU NEGATIVE 05/04/2022 1504   HGBUR NEGATIVE 05/04/2022 1504   BILIRUBINUR NEGATIVE 05/04/2022 1504   KETONESUR NEGATIVE 05/04/2022 1504   PROTEINUR NEGATIVE 05/04/2022 1504   NITRITE NEGATIVE 05/04/2022  1504   LEUKOCYTESUR NEGATIVE 05/04/2022 1504   Sepsis Labs: @LABRCNTIP (procalcitonin:4,lacticidven:4) )No results found for this or any previous visit (from the past 240 hours).   Radiological Exams on Admission:   Assessment/Plan Principal Problem:   Partial small bowel obstruction (HCC) Active Problems:   Leukocytosis   Cirrhosis of liver (HCC)   Crohn's colitis, with intestinal obstruction (HCC)   Chronic diastolic CHF (congestive heart failure) (HCC)   Essential hypertension   Chronic kidney disease, stage 3a (HCC)   Assessment and Plan:  Partial small bowel obstruction (HCC): CT showed possible early partial small bowel obstruction with a transition zone in the mid abdomen likely related to adhesions.  Message is sent to Dr. Lane of surgery for consult.  -Admit to med surg bed -NPO   -NG tube -morphine , oxycodone  prn pain -Prn Zofran  prn nausea   -IVF: 1L of LR and 1L of NS in ED, then NS 50 cc/h -INR/PTT -Follow-up general surgeons recommendation  Leukocytosis: WBC 23.9. no fever.  Pt is immunosuppressed due to use of Stelara, at high risk of infection - Started Rocephin  empirically. - Check  procalcitonin level  Cirrhosis of liver [facility]): Mental status normal.  AST 64, ALT 52, total bilirubin 2.3, ALP 132. -Check ammonia level -check INR and then calculate MELD score - Check PTT - Judicious use prn low-dose Tylenol  325 mg (cannot use NSAID due to high risk of bleeding and worsening renal function.  Crohn's colitis, with intestinal obstruction (HCC) - Patient is Stelara  Chronic diastolic CHF (congestive heart failure) (HCC): 2D echo 11/08/2022 showed EF of 55% with grade 2 diastolic dysfunction.  Patient has 1+ leg edema, but no SOB.  Does not seem to have CHF exacerbation. -Hold spironolactone  since patient need IV fluid - Check BMP  Essential hypertension: Patient is not taking medications for blood pressure.  Blood pressure 142/69 -IV hydralazine  as  needed  Chronic kidney disease, stage 3a (HCC): Slightly worsening than baseline.  Recent baseline creatinine 1.0 on 05/10/2024.  His creatinine is 1.29, BUN 27, GFR 56. - Follow-up with BMP - Avoid using renal toxic medications.     DVT ppx: SQ Heparin    Code Status: Full code   Family Communication:   Yes, patient's wife and the daughter  at bed side.    Disposition Plan:  Anticipate discharge back to previous environment  Consults called:  Consulted Dr. Lane of surgery.  Admission status and Level of care: Telemetry Medical:    for obs as inpt        Dispo: The patient is from: Home  Anticipated d/c is to: Home              Anticipated d/c date is: 2 days              Patient currently is not medically stable to d/c.    Severity of Illness:  The appropriate patient status for this patient is INPATIENT. Inpatient status is judged to be reasonable and necessary in order to provide the required intensity of service to ensure the patient's safety. The patient's presenting symptoms, physical exam findings, and initial radiographic and laboratory data in the context of their chronic comorbidities is felt to place them at high risk for further clinical deterioration. Furthermore, it is not anticipated that the patient will be medically stable for discharge from the hospital within 2 midnights of admission.   * I certify that at the point of admission it is my clinical judgment that the patient will require inpatient hospital care spanning beyond 2 midnights from the point of admission due to high intensity of service, high risk for further deterioration and high frequency of surveillance required.*       Date of Service 07/17/2024    Caleb Exon [facility]   If 7PM-7AM, please contact night-coverage [email or web address] 07/17/2024, 1:31 AM

## 2024-07-17 NOTE — Progress Notes (Signed)
 [facility]  - [place] [facility]   PATIENT NAME: Dean Ryan    MR#:  [id number]  DATE OF BIRTH:  [birth year]  SUBJECTIVE:  daughter at bedside. She is worried and the suction is not helping in the tube was clogged. She wants to try regular suction. Discussed with her will give it a try for couple hours and if no output then changed to intermittent since there may not be much remaining. Will ambulate patient thereafter. PRN pain meds. No output from colostomy    VITALS:  Blood pressure 125/64, pulse 85, temperature 98.1 F (36.7 C), resp. rate 18, height 5' 6 (1.676 m), weight 75 kg, SpO2 95%.  PHYSICAL EXAMINATION:   GENERAL:  83 y.o.-year-old patient with no acute distress.  LUNGS: Normal breath sounds bilaterally, no wheezing CARDIOVASCULAR: S1, S2 normal. No murmur   ABDOMEN: Soft,tender, distended.Colostomy+   EXTREMITIES: No  edema b/l.    NEUROLOGIC: nonfocal  patient is alert and awake  LABORATORY PANEL:  CBC Recent Labs  Lab 07/17/24 0511  WBC 16.8*  HGB 15.0  HCT 43.6  PLT 187    Chemistries  Recent Labs  Lab 07/17/24 0511  NA 138  K 4.7  CL 109  CO2 19*  GLUCOSE 142*  BUN 26*  CREATININE 1.08  CALCIUM 8.7*  AST 59*  ALT 48*  ALKPHOS 112  BILITOT 2.1*   Cardiac Enzymes No results for input(s): TROPONINI in the last 168 hours. RADIOLOGY:  DG Abd Portable 1 View Result Date: 07/17/2024 CLINICAL DATA:  Check gastric catheter placement EXAM: PORTABLE ABDOMEN - 1 VIEW COMPARISON:  None Available. FINDINGS: Gastric catheter is noted in the stomach. Scattered large and small bowel gas is seen. No free air is noted. IMPRESSION: Gastric catheter within the stomach. Electronically Signed   By: Oneil Devonshire M.D.   On: 07/17/2024 02:07   CT ABDOMEN PELVIS W CONTRAST Result Date: 07/17/2024 CLINICAL DATA:  Acute abdominal pain and decreased output from colostomy for 1 day EXAM: CT ABDOMEN AND PELVIS WITH CONTRAST TECHNIQUE: Multidetector  CT imaging of the abdomen and pelvis was performed using the standard protocol following bolus administration of intravenous contrast. RADIATION DOSE REDUCTION: This exam was performed according to the departmental dose-optimization program which includes automated exposure control, adjustment of the mA and/or kV according to patient size and/or use of iterative reconstruction technique. CONTRAST:  100 mL OMNIPAQUE  IOHEXOL  300 MG/ML  SOLN COMPARISON:  04/06/2024 FINDINGS: Lower chest: Scattered calcified granulomas are noted throughout both lung bases. Hepatobiliary: Scattered hypodensities are again seen in the liver unchanged from the prior exam. No new focal lesions are seen. Previously treated lesion within the right lobe of the liver is again seen but decreased in size now measuring 3.1 by 1.9 cm. Gallbladder has been surgically removed. Prominence of biliary tree is noted. Pancreas: Unremarkable. No pancreatic ductal dilatation or surrounding inflammatory changes. Spleen: Normal in size without focal abnormality. Adrenals/Urinary Tract: Adrenal glands are within normal limits. Kidneys demonstrate diffuse cortical thinning and cystic change similar to that noted on prior exam. Nonobstructing right renal stones are noted measuring up to 16 mm. Similar changes are noted on the right measuring up to 13 mm. No obstructive changes are seen. The bladder is decompressed. Stomach/Bowel: Loop colostomy in the left mid abdomen is noted. Diverticular change of the colon is seen. Postsurgical changes in the right colon are seen. No colic anastomosis appears within normal limits. Stomach is unremarkable. Mildly dilated loops of small bowel  are noted throughout the abdomen. Fecalization of bowel contents is noted and there is a focal transition zone seen on image number 54 of series 2. These changes are likely related to adhesions. Multiple loops of small bowel are matted in the anterior abdomen consistent with the prior  surgical history. Vascular/Lymphatic: Aortic atherosclerosis. No enlarged abdominal or pelvic lymph nodes. Reproductive: Prostate is unremarkable. Other: No abdominal wall hernia or abnormality. No abdominopelvic ascites. Musculoskeletal: No acute or significant osseous findings. IMPRESSION: Changes consistent with early partial small bowel obstruction with a transition zone in the mid abdomen likely related to adhesions. Bilateral renal calculi without obstructive change as described. Left-sided loop colostomy. Decrease in size of previously treated liver lesion. Multiple smaller hepatic hypodensities are noted which are not well characterized on this exam. They are however stable from the prior study. Electronically Signed   By: Oneil Devonshire M.D.   On: 07/17/2024 00:01    Assessment and Plan Dean Ryan is a 83 y.o. 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   Patient states that he noticed decreased (almost no) output from his colostomy since 1800 on 07/16/24.  Associated with nausea and multiple episode of nonbilious nonbloody vomiting and abdominal pain.     CT abdomen/pelvis: Changes consistent with early partial small bowel obstruction with a transition zone in the mid abdomen likely related to adhesions.   Bilateral renal calculi without obstructive change as described.   Left-sided loop colostomy.   Decrease in size of previously treated liver lesion. Multiple smaller hepatic hypodensities are noted which are not well characterized on this exam. They are however stable from the prior study.  Partial small bowel obstruction (HCC): CT showed possible early  partial small bowel obstruction with a transition zone in the mid abdomen likely related to adhesions.  Message is sent to Dr. Lane of surgery for consult.  -NPO  -NG tube to suction -morphine , oxycodone  prn pain -Prn Zofran  prn nausea   IVF -Follow-up general surgeons recommendation--Dr Rodenburg   Leukocytosis: WBC 23.9. no fever.  Pt is immunosuppressed due to use of Stelara, at high risk of infection - Started Rocephin  empirically. - Check  procalcitonin level--0.17 --no fever --will hold off abxs at present. No other source of infection found   Cirrhosis of liver [facility]): Mental status normal.  AST 64, ALT 52, total bilirubin 2.3, ALP 132. - stable --will resume Xifaxan  once able to take po   Crohn's colitis, with intestinal obstruction (HCC) - Patient is Stelara --pt follows with [facility]   Chronic diastolic CHF (congestive heart failure) (HCC): 2D echo 11/08/2022 showed EF of 55% with grade 2 diastolic dysfunction.  Patient has 1+ leg edema, but no SOB.  Does not seem to have CHF exacerbation. -Hold spironolactone     Essential hypertension: Patient is not taking medications for blood pressure.  Blood pressure 142/69 -IV hydralazine  as needed   Chronic kidney disease, stage 3a (HCC): Slightly worsening than baseline.  Recent baseline creatinine 1.0 on 05/10/2024.  His creatinine is 1.29, BUN 27, GFR 56. --creat down to 1.08 - Avoid using renal toxic medications.         DVT ppx: SQ Heparin    Code Status: Full code   Family Communication:   Yes, patient's  daughter  at bed side.    Disposition Plan:  Anticipate discharge back to previous environment  Consults called:  Consulted Dr. Lane of surgery.    Level of care: Telemetry Medical Status is: Inpatient Remains inpatient appropriate because: SBO    TOTAL TIME TAKING CARE OF THIS PATIENT: 45 minutes.  >50% time spent on counselling and coordination of care  Note: This dictation was prepared with Dragon  dictation along with smaller phrase technology. Any transcriptional errors that result from this process are unintentional.  Leita Blanch M.D    [facility]   CC: Primary care physician; Wendy Norleen Sieving, PA-C

## 2024-07-17 NOTE — Plan of Care (Signed)

## 2024-07-17 NOTE — ED Provider Notes (Signed)
 CT scan shows partial small bowel obstruction, patient stable.  NG tube ordered.  Hospital medicine consultation for admission.   Cyrena Mylar, MD 07/17/24 [phone]

## 2024-07-18 DIAGNOSIS — K50112 Crohn's disease of large intestine with intestinal obstruction: Secondary | ICD-10-CM | POA: Diagnosis not present

## 2024-07-18 DIAGNOSIS — I5032 Chronic diastolic (congestive) heart failure: Secondary | ICD-10-CM | POA: Diagnosis not present

## 2024-07-18 DIAGNOSIS — K56600 Partial intestinal obstruction, unspecified as to cause: Secondary | ICD-10-CM | POA: Diagnosis not present

## 2024-07-18 DIAGNOSIS — K7460 Unspecified cirrhosis of liver: Secondary | ICD-10-CM | POA: Diagnosis not present

## 2024-07-18 LAB — GLUCOSE, CAPILLARY: Glucose-Capillary: 108 mg/dL — ABNORMAL HIGH (ref 70–99)

## 2024-07-18 NOTE — Plan of Care (Signed)

## 2024-07-18 NOTE — Discharge Instructions (Addendum)
 Patient to follow-up with his G.I. physicians at [facility] on his appointment.

## 2024-07-18 NOTE — Discharge Summary (Signed)
 Physician Discharge Summary   Patient: Dean Ryan MRN: [id number] DOB: 1941-01-03  Admit date:     07/16/2024  Discharge date: 07/18/24  Discharge Physician: Leita Blanch   PCP: Wendy Norleen Sieving, PA-C   Recommendations at discharge:   follow-up PCP in 1 to 2 follow-up [facility] on your appointment  Discharge Diagnoses: Principal Problem:   Partial small bowel obstruction (HCC) Active Problems:   Leukocytosis   Cirrhosis of liver (HCC)   Crohn's colitis, with intestinal obstruction (HCC)   Chronic diastolic CHF (congestive heart failure) (HCC)   Essential hypertension   Chronic kidney disease, stage 3a (HCC)  Dean Ryan is a 83 y.o. 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   Patient states that he noticed decreased (almost no) output from his colostomy since 1800 on 07/16/24.  Associated with nausea and multiple episode of nonbilious nonbloody vomiting and abdominal pain.       CT abdomen/pelvis: Changes consistent with early partial small bowel obstruction with a transition zone in the mid abdomen likely related to adhesions.   Bilateral renal calculi without obstructive change as described.   Left-sided loop colostomy.   Decrease in size of previously treated liver lesion. Multiple smaller hepatic hypodensities are noted which are not well characterized on this exam. They are however stable from the prior study.   Partial small bowel obstruction (HCC): CT showed possible early partial small bowel obstruction with a transition zone in the mid abdomen likely related to adhesions.  Message is sent to Dr. Lane of surgery for consult.   -NPO  -NG tube to suction -morphine , oxycodone  prn pain -Prn Zofran  prn nausea   IVF -Follow-up general surgeons recommendation--Dr Rodenburg --8/10-- patient doing well. Good colostomy output. NG tube removed. Tolerated full liquid diet. Will discharge to home okay with surgery and patient also is eager to go home. Discussed with daughter at bedside to follow-up with [facility] as outpatient   Leukocytosis: WBC 23.9. no fever.  Pt is immunosuppressed due to use of Stelara, at high risk of infection - Started Rocephin  empirically. - Check  procalcitonin level--0.17 --no fever --will hold off abxs at present. No other source of infection found --wbc trending down   Cirrhosis of liver [facility]): Mental status normal.  AST 64, ALT 52, total bilirubin 2.3, ALP 132. - stable --will resume Xifaxan  at d/c   Crohn's colitis, with intestinal obstruction (HCC) - Patient is Stelara --pt follows with [facility]   Chronic diastolic CHF (congestive heart failure) (HCC): 2D echo 11/08/2022 showed EF of 55% with grade 2 diastolic dysfunction.  Patient has 1+ leg edema, but no SOB.  Does not seem to have CHF exacerbation. -resume home meds  Essential hypertension: Patient is not taking medications for blood pressure.  Blood pressure 142/69 -IV hydralazine  as needed   Chronic kidney disease, stage 3a (HCC): Slightly worsening than baseline.  Recent baseline creatinine 1.0 on 05/10/2024.  His creatinine is 1.29, BUN 27, GFR 56. --creat down to 1.08 - Avoid using renal toxic medications.         DVT ppx: SQ Heparin    Code Status: Full code   Family Communication:   Yes, patient's  daughter  at bed side.    Disposition Plan:  Anticipate discharge back to previous environment  Consults called:  Consulted Dr. Lane of surgery.         Disposition: Home Diet recommendation:  Discharge Diet Orders (From admission, onward)     Start     Ordered   07/18/24 0000  Diet - low sodium heart healthy         07/18/24 1420   07/18/24 0000  Diet full liquid       Comments: Advance to soft as tolerated   07/18/24 1420            DISCHARGE MEDICATION: Allergies as of 07/18/2024   No Known Allergies      Medication List     TAKE these medications    loratadine  10 MG tablet Commonly known as: CLARITIN  Take 10 mg by mouth daily.   MAG-200 PO Take 1 tablet by mouth in the morning and at bedtime.   mupirocin  ointment 2 % Commonly known as: BACTROBAN  Apply 1 Application topically daily.   pantoprazole  40 MG tablet Commonly known as: PROTONIX  Take 40 mg by mouth daily.   PRESERVISION AREDS 2 PO Take 1 tablet by mouth daily.   spironolactone  50 MG tablet Commonly known as: ALDACTONE  Take 50 mg by mouth daily.   Stelara 45 MG/0.5ML injection Generic drug: ustekinumab 90mg  subcutaneous every 8weeks   Xifaxan  550 MG Tabs tablet Generic drug: rifaximin  Take 550 mg by mouth 2 (two) times daily.        Follow-up Information     Wendy Norleen Sieving, PA-C. Schedule an appointment as soon as possible for a visit.   Specialty: Internal Medicine Contact information: [address] [place]. [place] [address] [phone]                Discharge Exam: Filed Weights   07/16/24 2156 07/17/24 0400 07/18/24 0416  Weight: 72.6 kg 75 kg 71.3 kg  GENERAL:  83 y.o.-year-old patient with no acute distress.  LUNGS: Normal breath sounds bilaterally, no wheezing CARDIOVASCULAR: S1, S2 normal. No murmur   ABDOMEN: Soft,tender, distended.Colostomy+   EXTREMITIES: No  edema b/l.    NEUROLOGIC: nonfocal  patient is alert and awake \  Condition at discharge: fair  The results of significant diagnostics from this hospitalization (including imaging, microbiology, ancillary and laboratory) are listed below for reference.   Imaging Studies: DG Abd Portable 1 View Result Date: 07/17/2024 CLINICAL DATA:  Check gastric catheter placement EXAM: PORTABLE ABDOMEN - 1  VIEW COMPARISON:  None Available. FINDINGS: Gastric catheter is noted in the stomach. Scattered large and small bowel gas is seen. No free air is noted. IMPRESSION: Gastric catheter within the stomach. Electronically Signed   By: Oneil Devonshire M.D.   On: 07/17/2024 02:07   CT ABDOMEN PELVIS W CONTRAST Result Date: 07/17/2024 CLINICAL DATA:  Acute abdominal pain and decreased output from colostomy for 1 day EXAM: CT ABDOMEN AND PELVIS WITH CONTRAST TECHNIQUE: Multidetector CT imaging of the abdomen and pelvis was performed using the standard protocol following bolus administration of intravenous contrast. RADIATION DOSE REDUCTION: This exam was performed according to the departmental dose-optimization program which includes automated exposure control, adjustment of the mA and/or kV according to patient size and/or use of iterative reconstruction technique. CONTRAST:  100 mL OMNIPAQUE  IOHEXOL  300 MG/ML  SOLN COMPARISON:  04/06/2024 FINDINGS: Lower chest: Scattered calcified granulomas are noted throughout both lung bases. Hepatobiliary: Scattered hypodensities are again seen in the liver unchanged from the prior exam. No new focal  lesions are seen. Previously treated lesion within the right lobe of the liver is again seen but decreased in size now measuring 3.1 by 1.9 cm. Gallbladder has been surgically removed. Prominence of biliary tree is noted. Pancreas: Unremarkable. No pancreatic ductal dilatation or surrounding inflammatory changes. Spleen: Normal in size without focal abnormality. Adrenals/Urinary Tract: Adrenal glands are within normal limits. Kidneys demonstrate diffuse cortical thinning and cystic change similar to that noted on prior exam. Nonobstructing right renal stones are noted measuring up to 16 mm. Similar changes are noted on the right measuring up to 13 mm. No obstructive changes are seen. The bladder is decompressed. Stomach/Bowel: Loop colostomy in the left mid abdomen is noted. 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 Postsurgical changes in the right colon are seen. No colic anastomosis appears within normal limits. Stomach is unremarkable. Mildly dilated loops of small bowel are noted throughout the abdomen. Fecalization of bowel contents is noted and there is a focal transition zone seen on image number 54 of series 2. These changes are likely related to adhesions. Multiple loops of small bowel are matted in the anterior abdomen consistent with the prior surgical history. Vascular/Lymphatic: Aortic atherosclerosis. No enlarged abdominal or pelvic lymph nodes. Reproductive: Prostate is unremarkable. Other: No abdominal wall hernia or abnormality. No abdominopelvic ascites. Musculoskeletal: No acute or significant osseous findings. IMPRESSION: Changes consistent with early partial small bowel obstruction with a transition zone in the mid abdomen likely related to adhesions. Bilateral renal calculi without obstructive change as described. Left-sided loop colostomy. Decrease in size of previously treated liver lesion. Multiple smaller hepatic hypodensities are noted which are not well characterized on this exam. They are however stable from the prior study. Electronically Signed   By: Oneil Devonshire M.D.   On: 07/17/2024 00:01    Microbiology: Results for orders placed or performed during the hospital encounter of 05/04/22  Blood culture (routine x 2)     Status: None   Collection Time: 05/04/22  3:00 PM   Specimen: BLOOD  Result Value Ref Range Status   Specimen Description BLOOD BLOOD RIGHT ARM  Final   Special Requests   Final    BOTTLES DRAWN AEROBIC AND ANAEROBIC Blood Culture results may not be optimal due to an excessive volume of blood received in culture bottles   Culture   Final    NO GROWTH 5 DAYS Performed at [facility], [address] [place], [place] [address]    Report Status 05/09/2022 FINAL  Final  Blood culture (routine x 2)     Status: None   Collection  Time: 05/04/22  7:10 PM   Specimen: BLOOD  Result Value Ref Range Status   Specimen Description BLOOD RIGHT ANTECUBITAL  Final   Special Requests   Final    BOTTLES DRAWN AEROBIC AND ANAEROBIC Blood Culture adequate volume   Culture   Final    NO GROWTH 5 DAYS Performed at [facility], [address], [place], [place] [address]    Report Status 05/09/2022 FINAL  Final  Calprotectin, Fecal     Status: Abnormal   Collection Time: 05/05/22 12:39 PM   Specimen: STOOL  Result Value Ref Range Status   Calprotectin, Fecal >800 (H) 0 - 120 ug/g Final    Comment: (NOTE) Concentration     Interpretation   Follow-Up < 5 - 50 ug/g     Normal           None >50 -120 ug/g  Borderline       Re-evaluate in 4-6 weeks    >120 ug/g     Abnormal         Repeat as clinically                                   indicated Performed At: [facility] [address] [place], [place] [id number] Jennette Shorter MD Ey:[id number]     Labs: CBC: Recent Labs  Lab 07/16/24 2157 07/17/24 0511  WBC 23.9* 16.8*  HGB 15.6 15.0  HCT 44.9 43.6  MCV 94.7 94.2  PLT 197 187   Basic Metabolic Panel: Recent Labs  Lab 07/16/24 2157 07/17/24 0511  NA 138 138  K 4.6 4.7  CL 104 109  CO2 21* 19*  GLUCOSE 145* 142*  BUN 27* 26*  CREATININE 1.29* 1.08  CALCIUM 9.2 8.7*   Liver Function Tests: Recent Labs  Lab 07/16/24 2157 07/17/24 0511  AST 64* 59*  ALT 52* 48*  ALKPHOS 132* 112  BILITOT 2.3* 2.1*  PROT 7.8 7.3  ALBUMIN 3.5 3.0*   CBG: Recent Labs  Lab 07/17/24 0828 07/18/24 0733  GLUCAP 129* 108*    Discharge time spent: greater than 30 minutes.  Signed: Leita Blanch, MD [facility] 07/18/2024

## 2024-07-18 NOTE — Plan of Care (Signed)

## 2024-07-18 NOTE — Progress Notes (Signed)
 [name] SURGICAL PROGRESS NOTE (cpt [id number])  Hospital Day(s): 1.   Post op day(s):  [name]   Interval History: Patient seen and examined, no acute events or new complaints overnight. Patient reports remarkable spontaneous output of ostomy, denies any persisting pain, cramping, nausea or vomiting.  Clearly wants NG tube out as he is well acquainted with this process.  Wants to be discharged soon as possible..  Review of Systems:  Constitutional: denies fever, chills  HEENT: denies cough or congestion  Respiratory: denies any shortness of breath  Cardiovascular: denies chest pain or palpitations  Gastrointestinal: denies abdominal pain, N/V, or diarrhea/and bowel function as per interval history Genitourinary: denies burning with urination or urinary frequency Musculoskeletal: denies pain, decreased motor or sensation Integumentary: denies any other rashes or skin discolorations Neurological: denies HA or vision/hearing changes   Vital signs in last 24 hours: [min-max] current  Temp:  [98.3 F (36.8 C)-98.7 F (37.1 C)] 98.5 F (36.9 C) (08/10 0733) Pulse Rate:  [66-84] 66 (08/10 0733) Resp:  [16-18] 16 (08/10 0733) BP: (125-131)/(55-66) 127/60 (08/10 0733) SpO2:  [95 %-97 %] 97 % (08/10 0733) Weight:  [71.3 kg] 71.3 kg (08/10 0416)     Height: 5' 6 (167.6 cm) Weight: 71.3 kg BMI (Calculated): 25.38   Intake/Output last 2 shifts:  08/09 0701 - 08/10 0700 In: 936.9 [P.O.:320; I.V.:496.9; NG/GT:120] Out: 1460 [Urine:550; Emesis/NG output:910]   Physical Exam:  Constitutional: alert, cooperative and no distress  HENT: normocephalic without obvious abnormality  Eyes: PERRL, EOM's grossly intact and symmetric  Neuro: CN II - XII grossly intact and symmetric without deficit  Respiratory: breathing non-labored at rest  Cardiovascular: regular rate and rhythm  Gastrointestinal: soft, non-tender, and non-distended, left-sided stoma with evidence of significant output  of both liquid stool and gas. Musculoskeletal: no edema or wounds, motor and sensation grossly intact, NT    Labs:     Latest Ref Rng & Units 07/17/2024    5:11 AM 07/16/2024    9:57 PM 04/08/2024    5:55 AM  CBC  WBC 4.0 - 10.5 K/uL 16.8  23.9  9.3   Hemoglobin 13.0 - 17.0 g/dL 84.9  84.3  86.3   Hematocrit 39.0 - 52.0 % 43.6  44.9  38.8   Platelets 150 - 400 K/uL 187  197  180       Latest Ref Rng & Units 07/17/2024    5:11 AM 07/16/2024    9:57 PM 04/08/2024    5:55 AM  CMP  Glucose 70 - 99 mg/dL 857  854  899   BUN 8 - 23 mg/dL 26  27  58   Creatinine 0.61 - 1.24 mg/dL 8.91  8.70  8.31   Sodium 135 - 145 mmol/L 138  138  135   Potassium 3.5 - 5.1 mmol/L 4.7  4.6  4.0   Chloride 98 - 111 mmol/L 109  104  110   CO2 22 - 32 mmol/L 19  21  17    Calcium 8.9 - 10.3 mg/dL 8.7  9.2  8.0   Total Protein 6.5 - 8.1 g/dL 7.3  7.8  6.8   Total Bilirubin 0.0 - 1.2 mg/dL 2.1  2.3  1.9   Alkaline Phos 38 - 126 U/L 112  132  97   AST 15 - 41 U/L 59  64  31   ALT 0 - 44 U/L 48  52  36      Imaging studies: No new pertinent  imaging studies   Assessment/Plan:  83 y.o. 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) 04/06/2024   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  - Anticipate this gentleman as he knows his body well, will likely fare well with full liquid diet discharge home later today.  - I appreciate the opportunity in assistance in caring for this patient     All of the above findings and recommendations were discussed with the patient, patient's family, and the medical team, and all of patient's and family's questions were answered to their expressed satisfaction.  I personally spent a total of 35 minutes in the care of the patient today including preparing to see the patient, performing a medically appropriate exam/evaluation, placing orders, and documenting clinical information in the EHR.  -- [name] M.D., [facility] 07/18/2024 10:51 AM

## 2024-07-21 ENCOUNTER — Encounter: Admitting: Dermatology

## 2024-07-24 MED ORDER — PANTOPRAZOLE 40 MG TABLET,DELAYED RELEASE
ORAL_TABLET | Freq: Every day | ORAL | 3 refills | 0.00000 days
Start: 2024-07-24 — End: ?

## 2024-07-26 MED ORDER — PANTOPRAZOLE 40 MG TABLET,DELAYED RELEASE
ORAL_TABLET | Freq: Every day | ORAL | 0 refills | 90.00000 days | Status: CP
Start: 2024-07-26 — End: ?

## 2024-07-27 DIAGNOSIS — C220 Liver cell carcinoma: Principal | ICD-10-CM

## 2024-07-28 ENCOUNTER — Encounter
Admit: 2024-07-28 | Discharge: 2024-07-29 | Payer: MEDICARE | Attending: Student in an Organized Health Care Education/Training Program | Primary: Student in an Organized Health Care Education/Training Program

## 2024-07-28 ENCOUNTER — Ambulatory Visit: Admit: 2024-07-28 | Discharge: 2024-07-29 | Payer: MEDICARE

## 2024-07-28 LAB — COMPREHENSIVE METABOLIC PANEL
ALBUMIN: 3.1 g/dL — ABNORMAL LOW (ref 3.4–5.0)
ALKALINE PHOSPHATASE: 199 U/L — ABNORMAL HIGH (ref 46–116)
ALT (SGPT): 53 U/L — ABNORMAL HIGH (ref 10–49)
ANION GAP: 13 mmol/L (ref 5–14)
AST (SGOT): 70 U/L — ABNORMAL HIGH (ref <=34)
BILIRUBIN TOTAL: 1.1 mg/dL (ref 0.3–1.2)
BLOOD UREA NITROGEN: 17 mg/dL (ref 9–23)
BUN / CREAT RATIO: 15
CALCIUM: 9.2 mg/dL (ref 8.7–10.4)
CHLORIDE: 105 mmol/L (ref 98–107)
CO2: 23.9 mmol/L (ref 20.0–31.0)
CREATININE: 1.13 mg/dL (ref 0.73–1.18)
EGFR CKD-EPI (2021) MALE: 65 mL/min/1.73m2 (ref >=60)
GLUCOSE RANDOM: 126 mg/dL (ref 70–179)
POTASSIUM: 4.5 mmol/L (ref 3.4–4.8)
PROTEIN TOTAL: 7.6 g/dL (ref 5.7–8.2)
SODIUM: 142 mmol/L (ref 135–145)

## 2024-07-28 LAB — CBC W/ AUTO DIFF
BASOPHILS ABSOLUTE COUNT: 0.1 10*9/L (ref 0.0–0.1)
BASOPHILS RELATIVE PERCENT: 1 %
EOSINOPHILS ABSOLUTE COUNT: 0.3 10*9/L (ref 0.0–0.5)
EOSINOPHILS RELATIVE PERCENT: 3.1 %
HEMATOCRIT: 40.2 % (ref 39.0–48.0)
HEMOGLOBIN: 14.1 g/dL (ref 12.9–16.5)
LYMPHOCYTES ABSOLUTE COUNT: 1.3 10*9/L (ref 1.1–3.6)
LYMPHOCYTES RELATIVE PERCENT: 13.4 %
MEAN CORPUSCULAR HEMOGLOBIN CONC: 35.2 g/dL (ref 32.0–36.0)
MEAN CORPUSCULAR HEMOGLOBIN: 33.5 pg — ABNORMAL HIGH (ref 25.9–32.4)
MEAN CORPUSCULAR VOLUME: 95.2 fL (ref 77.6–95.7)
MEAN PLATELET VOLUME: 9.1 fL (ref 6.8–10.7)
MONOCYTES ABSOLUTE COUNT: 0.8 10*9/L (ref 0.3–0.8)
MONOCYTES RELATIVE PERCENT: 8.6 %
NEUTROPHILS ABSOLUTE COUNT: 7 10*9/L (ref 1.8–7.8)
NEUTROPHILS RELATIVE PERCENT: 73.9 %
NUCLEATED RED BLOOD CELLS: 0 /100{WBCs} (ref <=4)
PLATELET COUNT: 165 10*9/L (ref 150–450)
RED BLOOD CELL COUNT: 4.23 10*12/L — ABNORMAL LOW (ref 4.26–5.60)
RED CELL DISTRIBUTION WIDTH: 14.5 % (ref 12.2–15.2)
WBC ADJUSTED: 9.5 10*9/L (ref 3.6–11.2)

## 2024-07-28 LAB — PROTIME-INR
INR: 1.18
PROTIME: 13.4 s — ABNORMAL HIGH (ref 9.9–12.6)

## 2024-07-28 LAB — AFP TUMOR MARKER: AFP-TUMOR MARKER: 6 ng/mL (ref <=8)

## 2024-08-04 ENCOUNTER — Ambulatory Visit: Admit: 2024-08-04 | Discharge: 2024-08-04 | Payer: MEDICARE

## 2024-08-04 ENCOUNTER — Inpatient Hospital Stay: Admit: 2024-08-04 | Discharge: 2024-08-04 | Payer: MEDICARE

## 2024-08-04 ENCOUNTER — Ambulatory Visit
Admit: 2024-08-04 | Discharge: 2024-08-04 | Payer: MEDICARE | Attending: Student in an Organized Health Care Education/Training Program | Primary: Student in an Organized Health Care Education/Training Program

## 2024-08-04 DIAGNOSIS — C220 Liver cell carcinoma: Principal | ICD-10-CM

## 2024-08-04 MED ADMIN — gadopiclenol (ELUCIREM,VUEWAY) injection 7.5 mL: 7.5 mL | INTRAVENOUS | @ 14:00:00 | Stop: 2024-08-04

## 2024-08-04 NOTE — Unmapped (Signed)
 Will INTERVENTIONAL RADIOLOGY - FOLLOW UP VISIT    Reason for visit: ESTABLISHED PATIENT-STATUS POST TACE    Subjective:     Chief Complaint: Thomas Rosales is a 83 y.o. 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     Interval History:  83 y.o. male with pertinent history of Crohn's disease, recurrent SBO, HCC s/p TACE procedure to treat segment 5 lesion on 09/26/23; repeat TACE of segment 5 lesion on 12/16/23. He then underwent percutaneous cryoablation of segment 5 lesion on 03/03/24. All procedures done with Dr. Cathie. He had clinic follow-up in June when he reported he was doing well post-procedure. No residual issues with pain.     He was admitted to OSH from 8/8-8/10 for partial SBO likely related to adhesions. He was treated with bowel rest and decompression. His diet was slowly advanced prior to discharge. Today, he reports continuing to feel well from a post-procedural perspective. Having more issues with the Crohn's disease, but continues without residual issues from the ablation in March.     Past Medical/Surgical History:  Past Medical History[1]    Past Surgical History[2]    Family History:  Patient family history includes Arthritis in his mother; Cancer in his father; Dementia in his father; Heart disease in his mother.    Social History:    Social History [3]    Allergies:  Allergies[4]    Contrast Allergy: No    Medications:  Current Medications[5]    Review of Systems:  Complete ROS performed. All systems negative except as detailed in HPI or below.     Objective:     Physical Exam    Vital Signs:     Vitals:    08/04/24 1115   BP: 129/67   Pulse: 55   Temp: 36.7 ??C (98.1 ??F)   SpO2: 98%     Physical Examination: General appearance - alert, well appearing, and in no distress and oriented to person, place, and time  Chest - clear to auscultation, no wheezes, rales or rhonchi, symmetric air entry, no tachypnea, retractions or cyanosis  Neurological - alert, oriented, normal speech, no focal findings or movement disorder noted    Pertinent Laboratory Values:     Lab Results   Component Value Date    BILITOT 1.1 07/28/2024    BILIDIR 0.70 (H) 04/20/2023    PROT 7.6 07/28/2024    ALBUMIN  3.1 (L) 07/28/2024    ALT 53 (H) 07/28/2024    AST 70 (H) 07/28/2024    ALKPHOS 199 (H) 07/28/2024       Lab Results   Component Value Date    LABPROT 10.9 06/09/2023    INR 1.18 07/28/2024    APTT 27.7 09/22/2020     Labs: 08/04/24, labs were reviewed by me personally. Interpretation: Labs from 05/10/24 demonstrate satisfactory liver function with normalized AFP. This support a good response to therapy.     Assessment:   This is an 83 y.o. male with imaging characteristics compatible with unresectable hepatocellular carcinoma (HCC) status post locoregional therapy with TACE and cryoablation (most recently March 2025).    Imaging Source/Date: MRI 08/04/2024. Dr. Cathie personally reviewed the imaging.        MELD 3.0: 10 at 07/28/2024 12:36 PM  MELD-Na: 10 at 07/28/2024 12:36 PM  Calculated from:  Serum Creatinine: 1.13 mg/dL at 1/79/7974 87:63 PM  Serum Sodium: 142 mmol/L (Using max of 137 mmol/L) at 07/28/2024 12:36 PM  Total Bilirubin:  1.1 mg/dL at 1/79/7974 87:63 PM  Serum Albumin : 3.1 g/dL at 1/79/7974 87:63 PM  INR(ratio): 1.18 at 07/28/2024 12:36 PM  Age at listing (hypothetical): 47 years  Sex: Male at 07/28/2024 12:36 PM    Plan (Medical Decision Making):     Thomas Rosales is a 83 y.o. male with Crohn's disease, NASH cirrhosis c/b HCC s/p TACE and now Cryoablation 03/03/2024.     Patient is doing well post ablation, with MRI and labs reassuring. Will again attain repeat imaging in 3 months. Dr. Cathie will call patient to further discuss MRI results.     Plan:   - MRI and labs in 3 months (November 2025), with VIR clinic follow up.     Please do not hesitate to contact me if you have additional questions.     Lauraine CHRISTELLA Conradi, FNP    Division of Interventional Radiology  [employer]          [1]   Past Medical History:  Diagnosis Date    Anal fissure 1983    Apnea, sleep     uses CPAP at night. instructed to bring DOS.     Basal cell carcinoma     Cancer         skin cancer    CHF (congestive heart failure)         Cirrhosis     04/15/2023    Crohn's disease         Diverticulitis of colon     Fatty liver     Gallstones     GERD (gastroesophageal reflux disease)     H/O echocardiogram     Hypertension     Irritable bowel syndrome     Kidney stone     NASH (nonalcoholic steatohepatitis)     Syncope     dizziness    Type 2 diabetes mellitus, without long-term current use of insulin      10/24/2022   [2]   Past Surgical History:  Procedure Laterality Date    APPENDECTOMY      BOWEL RESECTION  1981, 1984, 1995    3 ft small intestine, <1 ft large intestines, cecum removed; 2nd bowel resection anal fistulas removed; 3rd bowel resection most of colon removed, colostomy placed.    CARDIAC CATHETERIZATION      CARDIOVASCULAR STRESS TEST      CHOLECYSTECTOMY      COLON SURGERY      Same as small bowel surgery    COLOSTOMY  1995    EYE SURGERY  1/20 & 4/21    Cataract & vitrious removal    FRACTURE SURGERY  1961    Broken tibia and fibula    HERNIA REPAIR  2019    Done by Dr Day    IR ABLATION CRYO FIBRO W US  GUIDE  03/03/2024    IR ABLATION CRYO FIBRO W US  GUIDE 03/03/2024 Cathie Rue, MD IMG VIR [facility]    IR EMBOLIZATION ORGAN ISCHEMIA, TUMORS, INFAR  09/26/2023    IR EMBOLIZATION ORGAN ISCHEMIA, TUMORS, INFAR 09/26/2023 Cathie Rue, MD IMG VIR [facility]    IR EMBOLIZATION ORGAN ISCHEMIA, TUMORS, INFAR  12/16/2023    IR EMBOLIZATION ORGAN ISCHEMIA, TUMORS, INFAR 12/16/2023 Cathie Rue, MD IMG VIR [facility]    LAPAROSCOPIC SMALL BOWEL RESECTION      LEG SURGERY Left     pins in shin    LITHOTRIPSY      PR COLONOSCOPY FLX DX W/COLLJ SPEC WHEN  PFRMD  03/19/2018    Procedure: COLONOSCOPY WITH POSSIBLE BIOPSY, POSSIBLE POLYPECTOMY.;  Surgeon: Elsie Jackquline Crooked, MD;  Location: ENDO OR [facility];  Service: Gastroenterology    PR COLSC FLEXIBLE W/TRANSENDOSCOPIC BALLOON DILAT N/A 09/13/2022    Procedure: COLONOSCOPY, FLEXIBLE; WITH DILATION BY BALLOON, 1 OR MORE STRICTURES;  Surgeon: Conny Dorn Agent, MD;  Location: [facility] PROCEDURES [facility];  Service: Gastroenterology    PR INCISION & DRAINAGE ABSCESS SIMPLE/SINGLE N/A 08/11/2018    Procedure: INCISION AND DRAINAGE OF ABDOMINAL WALL ABSCESS;  Surgeon: Elveria Lewandowsky Day, MD;  Location: OR [place];  Service: General Surgery    PR LAP, INCISIONAL HERNIA REPAIR,REDUCIBLE N/A 06/02/2018    Procedure: LAPAROSCOPIC ASSISTED PARASTOMAL HERNIA REPAIR AND INCISIONAL HERNIA REPAIR WITH MESH;  Surgeon: Elveria Lewandowsky Day, MD;  Location: OR [place];  Service: General Surgery    PR VITRECTOMY,MECHANICAL Right 03/28/2020    Procedure: trans pars planar vitrectomy, right eye   ;  Surgeon: Olam Roger, MD;  Location: [facility] OR [place];  Service: Ophthalmology    PR XCAPSL CTRC RMVL INSJ IO LENS PROSTH W/O ECP Right 01/13/2019    Procedure: PHACOEMULSIFICATION OF CATARACT WITH INSERTION OF INTRAOCULAR LENS/TORIC LENS/ORA;  Surgeon: Jerel Jama Pear, MD;  Location: OR [facility];  Service: Ophthalmology    PR XCAPSL CTRC RMVL INSJ IO LENS PROSTH W/O ECP Left 01/27/2019    Procedure: PHACOEMULSIFICATION OF CATARACT WITH INSERTION OF INTRAOCULAR LENS/TORIC LENS/ORA;  Surgeon: Jerel Jama Pear, MD;  Location: OR [facility];  Service: Ophthalmology    RESECTION SMALL BOWEL / CLOSURE ILEOSTOMY  1981    temporary ileostomy     REVISION COLOSTOMY  2009    SMALL INTESTINE SURGERY  3x since 1984    Crohn???s disease    URETERAL STENT PLACEMENT  2008   [3]   Social History  Socioeconomic History    Marital status: Married    Number of children: 2    Highest education level: Master's degree (e.g., MA, MS, MEng, MEd, MSW, MBA)   Tobacco Use    Smoking status: Former     Current packs/day: 0.00     Average packs/day: 1 pack/day for 20.0 years (20.0 ttl pk-yrs)     Types: Cigarettes     Start date: 12/09/1964     Quit date: 12/09/1984     Years since quitting: 39.6    Smokeless tobacco: Never   Vaping Use    Vaping status: Never Used   Substance and Sexual Activity    Alcohol use: Not Currently     Comment: occasionally    Drug use: No    Sexual activity: Yes     Partners: Female     Birth control/protection: None     Social Drivers of Health     Financial Resource Strain: Low Risk  (08/02/2024)    Overall Financial Resource Strain (CARDIA)     Difficulty of Paying Living Expenses: Not hard at all   Food Insecurity: No Food Insecurity (08/02/2024)    Hunger Vital Sign     Worried About Running Out of Food in the Last Year: Never true     Ran Out of Food in the Last Year: Never true   Transportation Needs: No Transportation Needs (08/02/2024)    PRAPARE - Transportation     Lack of Transportation (Medical): No     Lack of Transportation (Non-Medical): No   Physical Activity: Inactive (04/19/2021)    Exercise Vital Sign     Days of Exercise per Week:  0 days     Minutes of Exercise per Session: 0 min   Stress: No Stress Concern Present (04/19/2021)    [employer] of Occupational Health - Occupational Stress Questionnaire     Feeling of Stress : Not at all   Social Connections: Unknown (07/17/2024)    [facility]    Social Connection and Isolation Panel     In a typical week, how many times do you talk on the phone with family, friends, or neighbors?: More than three times a week     How often do you get together with friends or relatives?: More than three times a week     How often do you attend church or religious services?: Patient declined     Do you belong to any clubs or organizations such as church groups, unions, fraternal or athletic groups, or school groups?: Patient declined     How often do you attend meetings of the clubs or organizations you belong to?: Patient declined     Are you married, widowed, divorced, separated, never married, or living with a partner?: Married   Housing: Low Risk  (08/02/2024)    Housing     Within the past 12 months, have you ever stayed: outside, in a car, in a tent, in an overnight shelter, or temporarily in someone else's home (i.e. couch-surfing)?: No     Are you worried about losing your housing?: No   [4] No Known Allergies  [5]   Current Outpatient Medications:     cholecalciferol, vitamin D3-25 mcg, 1,000 unit,, 25 mcg (1,000 unit) capsule, Take 1 capsule (25 mcg total) by mouth two (2) times a day., Disp: , Rfl:     magnesium oxide 250 mg (150 mg elemental) tablet, Take 1 tablet by mouth two (2) times a day., Disp: , Rfl:     multivitamin (TAB-A-VITE/THERAGRAN) per tablet, Take 1 tablet by mouth daily., Disp: , Rfl:     ondansetron  (ZOFRAN -ODT) 4 MG disintegrating tablet, Dissolve 1 tablet on tongue every 8hours as needed for nausea, Disp: 30 tablet, Rfl: 4    pantoprazole  (PROTONIX ) 40 MG tablet, TAKE 1 TABLET DAILY, Disp: 90 tablet, Rfl: 0    propylene glycol/peg 400 (SYSTANE OPHT), Administer 1 drop to both eyes daily as needed (dry/itchy eyes)., Disp: , Rfl:     rifAXIMin  (XIFAXAN ) 550 mg Tab, Take 1 tablet (550 mg total) by mouth two (2) times a day., Disp: 180 tablet, Rfl: 3    spironolactone  (ALDACTONE ) 50 MG tablet, Take 1 tablet (50 mg total) by mouth daily., Disp: 90 tablet, Rfl: 3    ustekinumab  (STELARA ) 90 mg/mL Syrg syringe, Inject 1 mL (90 mg total) under the skin once., Disp: , Rfl:     vit C/E/Zn/coppr/lutein/zeaxan (PRESERVISION AREDS 2 ORAL), Take 1 tablet by mouth two (2) times a day., Disp: , Rfl:     white petrolatum (VASELINE) jelly, Apply topically daily as needed., Disp: , Rfl:     blood sugar diagnostic (GLUCOSE BLOOD) Strp, Check blood sugar as directed once a day and for symptoms of high or low blood sugar., Disp: 100 strip, Rfl: 0    blood-glucose meter kit, Use as instructed, Disp: 1 each, Rfl: 0    EPINEPHrine  (EPIPEN ) 0.3 mg/0.3 mL injection, Inject 0.3 mL (0.3 mg total) under the skin once., Disp: , Rfl:     METOPROLOL  SUCCINATE ORAL, , Disp: , Rfl:     oxyCODONE  (ROXICODONE ) 5 MG immediate release tablet, Take  1 tablet (5 mg total) by mouth every six (6) hours as needed for pain., Disp: 10 tablet, Rfl: 0    syringe, disposable, 1 mL Syrg, To be used with doxepin  oral solution., Disp: 25 each, Rfl: 0    ustekinumab  (STELARA ) 45 mg/0.5 mL Soln, 90mg  subcutaneous every 8weeks (Patient taking differently: Inject 90 mg under the skin. 90mg  subcutaneous every 8weeks), Disp: 1 mL, Rfl: 5  No current facility-administered medications for this visit.

## 2024-08-06 ENCOUNTER — Encounter: Admit: 2024-08-06 | Discharge: 2024-08-06 | Payer: MEDICARE

## 2024-08-06 DIAGNOSIS — R2689 Other abnormalities of gait and mobility: Principal | ICD-10-CM

## 2024-08-06 DIAGNOSIS — R0601 Orthopnea: Principal | ICD-10-CM

## 2024-08-06 DIAGNOSIS — I420 Dilated cardiomyopathy: Principal | ICD-10-CM

## 2024-08-06 DIAGNOSIS — I5189 Other ill-defined heart diseases: Principal | ICD-10-CM

## 2024-08-06 DIAGNOSIS — G4733 Obstructive sleep apnea (adult) (pediatric): Principal | ICD-10-CM

## 2024-08-06 DIAGNOSIS — R0602 Shortness of breath: Principal | ICD-10-CM

## 2024-08-06 LAB — PRO-BNP: PRO-BNP: 148 pg/mL (ref <=300.0)

## 2024-08-06 NOTE — Unmapped (Signed)
 Assessment and Plan  1. OSA (obstructive sleep apnea)    2. Shortness of breath    3. Diastolic dysfunction    4. Orthopnea    5. Cardiomyopathy, dilated          1) osa  -referral to sleep clinic    2) heart failure, sob  -referral to cardiology.    3) deconditioning  -physical therapy order palced    Visit time: In excess of 40 minutes, more than 50% of which was spent face-to-face in counseling and education activities regarding the diagnoses above.      Requested Prescriptions      No prescriptions requested or ordered in this encounter     There are no discontinued medications.    No follow-ups on file.  Signs and symptoms that should prompt return sooner than scheduled were reviewed with the patient.        HPI    Chief Complaint  Chief Complaint   Patient presents with    Other     Heart Issues 60 % [facility] ; in the last 6 months has been running  out of air an coughing that wakes him up at night due to phlegm ; couple of times has had pain in the left arm that radiates down the left arm. Wants another CPAP sleep study.     Thomas Rosales is here for follow up.    Recent hospitilization.  Echo in the hospital shows some diastolic dysfunction.  Also having issue with cpap.    Allergies:  Patient has no known allergies.    Medications:   Medications Prior to Visit[1]    Medical History:  Past Medical History[2]    Surgical History:  Past Surgical History[3]    Social History:  Tobacco use:   reports that he quit smoking about 39 years ago. His smoking use included cigarettes. He started smoking about 59 years ago. He has a 20 pack-year smoking history. He has never used smokeless tobacco.  Alcohol use:   reports that he does not currently use alcohol.  Drug use:  reports no history of drug use.      Family History:  Family History[4]        Review of Systems:  ROS:  all other systems reviewed are negative except as noted in HPI        Vitals:    08/06/24 1029   BP: 134/62   Pulse: 62 SpO2: 97%   Weight: 74.8 kg (164 lb 14.4 oz)   Height: 167.6 cm (5' 6)       Physical Exam  General: alert, oriented, no distress  HEENT: Anicteric sclerae  RESP: Relaxed respiratory effort.      SKIN: Appropriately warm and dry  NEURO: no focal deficits,   EXT:  No peripheral edema      [facility] Components:     Barriers to goals identified and addressed. Pertinent handouts were given today and reviewed with the patient as indicated.  The Care Plan and Self-Management goals have been included on the AVS and the AVS has been printed. Any outside resources or referrals needed at this time are noted above. Patient's current medications have been reviewed. Any new medications prescribed have been discussed, and side effects have been addressed. Have assessed the patient's understanding, response, and barriers to adherence to medications. Patient voiced understanding and all questions have been answered to satisfaction.          [  1]   Outpatient Medications Prior to Visit   Medication Sig Dispense Refill    cholecalciferol, vitamin D3-25 mcg, 1,000 unit,, 25 mcg (1,000 unit) capsule Take 1 capsule (25 mcg total) by mouth two (2) times a day.      EPINEPHrine  (EPIPEN ) 0.3 mg/0.3 mL injection Inject 0.3 mL (0.3 mg total) under the skin once.      magnesium oxide 250 mg (150 mg elemental) tablet Take 1 tablet by mouth two (2) times a day.      multivitamin (TAB-A-VITE/THERAGRAN) per tablet Take 1 tablet by mouth daily.      ondansetron  (ZOFRAN -ODT) 4 MG disintegrating tablet Dissolve 1 tablet on tongue every 8hours as needed for nausea 30 tablet 4    pantoprazole  (PROTONIX ) 40 MG tablet TAKE 1 TABLET DAILY 90 tablet 0    propylene glycol/peg 400 (SYSTANE OPHT) Administer 1 drop to both eyes daily as needed (dry/itchy eyes).      rifAXIMin  (XIFAXAN ) 550 mg Tab Take 1 tablet (550 mg total) by mouth two (2) times a day. 180 tablet 3    spironolactone  (ALDACTONE ) 50 MG tablet Take 1 tablet (50 mg total) by mouth daily. 90 tablet 3    ustekinumab  (STELARA ) 45 mg/0.5 mL Soln 90mg  subcutaneous every 8weeks 1 mL 5    ustekinumab  (STELARA ) 90 mg/mL Syrg syringe Inject 1 mL (90 mg total) under the skin once.      vit C/E/Zn/coppr/lutein/zeaxan (PRESERVISION AREDS 2 ORAL) Take 1 tablet by mouth two (2) times a day.      white petrolatum (VASELINE) jelly Apply topically daily as needed.      blood sugar diagnostic (GLUCOSE BLOOD) Strp Check blood sugar as directed once a day and for symptoms of high or low blood sugar. 100 strip 0    blood-glucose meter kit Use as instructed 1 each 0    METOPROLOL  SUCCINATE ORAL       oxyCODONE  (ROXICODONE ) 5 MG immediate release tablet Take 1 tablet (5 mg total) by mouth every six (6) hours as needed for pain. 10 tablet 0    syringe, disposable, 1 mL Syrg To be used with doxepin  oral solution. 25 each 0     No facility-administered medications prior to visit.   [2]   Past Medical History:  Diagnosis Date    Anal fissure 1983    Apnea, sleep     uses CPAP at night. instructed to bring DOS.     Basal cell carcinoma     Cancer         skin cancer    CHF (congestive heart failure)         Cirrhosis     04/15/2023    Crohn's disease         Diverticulitis of colon     Fatty liver     Gallstones     GERD (gastroesophageal reflux disease)     H/O echocardiogram     Hypertension     Irritable bowel syndrome     Kidney stone     NASH (nonalcoholic steatohepatitis)     Syncope     dizziness    Type 2 diabetes mellitus, without long-term current use of insulin      10/24/2022   [3]   Past Surgical History:  Procedure Laterality Date    APPENDECTOMY      BOWEL RESECTION  1981, 1984, 1995    3 ft small intestine, <1 ft large intestines, cecum removed; 2nd bowel resection anal fistulas  removed; 3rd bowel resection most of colon removed, colostomy placed.    CARDIAC CATHETERIZATION      CARDIOVASCULAR STRESS TEST      CHOLECYSTECTOMY      COLON SURGERY      Same as small bowel surgery    COLOSTOMY  1995    EYE SURGERY  1/20 & 4/21 Cataract & vitrious removal    FRACTURE SURGERY  1961    Broken tibia and fibula    HERNIA REPAIR  2019    Done by Dr Day    IR ABLATION CRYO FIBRO W US  GUIDE  03/03/2024    IR ABLATION CRYO FIBRO W US  GUIDE 03/03/2024 Cathie Rue, MD IMG VIR [facility]    IR EMBOLIZATION ORGAN ISCHEMIA, TUMORS, INFAR  09/26/2023    IR EMBOLIZATION ORGAN ISCHEMIA, TUMORS, INFAR 09/26/2023 Cathie Rue, MD IMG VIR [facility]    IR EMBOLIZATION ORGAN ISCHEMIA, TUMORS, INFAR  12/16/2023    IR EMBOLIZATION ORGAN ISCHEMIA, TUMORS, INFAR 12/16/2023 Cathie Rue, MD IMG VIR [facility]    LAPAROSCOPIC SMALL BOWEL RESECTION      LEG SURGERY Left     pins in shin    LITHOTRIPSY      PR COLONOSCOPY FLX DX W/COLLJ SPEC WHEN PFRMD  03/19/2018    Procedure: COLONOSCOPY WITH POSSIBLE BIOPSY, POSSIBLE POLYPECTOMY.;  Surgeon: Elsie Jackquline Crooked, MD;  Location: ENDO OR [facility];  Service: Gastroenterology    PR COLSC FLEXIBLE W/TRANSENDOSCOPIC BALLOON DILAT N/A 09/13/2022    Procedure: COLONOSCOPY, FLEXIBLE; WITH DILATION BY BALLOON, 1 OR MORE STRICTURES;  Surgeon: Conny Dorn Agent, MD;  [facility];  Service: Gastroenterology    PR INCISION & DRAINAGE ABSCESS SIMPLE/SINGLE N/A 08/11/2018    Procedure: INCISION AND DRAINAGE OF ABDOMINAL WALL ABSCESS;  Surgeon: Elveria Lewandowsky Day, MD;  Location: OR [place];  Service: General Surgery    PR LAP, INCISIONAL HERNIA REPAIR,REDUCIBLE N/A 06/02/2018    Procedure: LAPAROSCOPIC ASSISTED PARASTOMAL HERNIA REPAIR AND INCISIONAL HERNIA REPAIR WITH MESH;  Surgeon: Elveria Lewandowsky Day, MD;  Location: OR [place];  Service: General Surgery    PR VITRECTOMY,MECHANICAL Right 03/28/2020    Procedure: trans pars planar vitrectomy, right eye   ;  Surgeon: Olam Roger, MD;  Location: [facility] OR [place];  Service: Ophthalmology    PR XCAPSL CTRC RMVL INSJ IO LENS PROSTH W/O ECP Right 01/13/2019    Procedure: PHACOEMULSIFICATION OF CATARACT WITH INSERTION OF INTRAOCULAR LENS/TORIC LENS/ORA;  Surgeon: Jerel Jama Pear, MD;  Location: OR [facility];  Service: Ophthalmology    PR XCAPSL CTRC RMVL INSJ IO LENS PROSTH W/O ECP Left 01/27/2019    Procedure: PHACOEMULSIFICATION OF CATARACT WITH INSERTION OF INTRAOCULAR LENS/TORIC LENS/ORA;  Surgeon: Jerel Jama Pear, MD;  Location: OR [facility];  Service: Ophthalmology    RESECTION SMALL BOWEL / CLOSURE ILEOSTOMY  1981    temporary ileostomy     REVISION COLOSTOMY  2009    SMALL INTESTINE SURGERY  3x since 1984    Crohn???s disease    URETERAL STENT PLACEMENT  2008   [4]   Family History  Problem Relation Age of Onset    Heart disease Mother         pacemaker    Arthritis Mother     Dementia Father     Cancer Father

## 2024-08-16 NOTE — Unmapped (Signed)
 VIR TREATMENT UPDATE NOTE     Mr. Silber case disussed at tumor board - with decision to pursue treatment of the residual lesions with IRE made.     I spoke with Mr. Ealy via telehephone and disussed this - with plan to proceed with procedure.     Plan:   - IRE with anesthesia for residual liver mass.       Marolyn Boas, MD  Assistant Professor of Radiology  Division of Vascular and Interventional Radiology  [employer]  [facility]

## 2024-08-17 DIAGNOSIS — I447 Left bundle-branch block, unspecified: Principal | ICD-10-CM

## 2024-08-17 DIAGNOSIS — R0789 Other chest pain: Principal | ICD-10-CM

## 2024-08-17 DIAGNOSIS — I5032 Chronic diastolic (congestive) heart failure: Principal | ICD-10-CM

## 2024-08-17 DIAGNOSIS — R5383 Other fatigue: Principal | ICD-10-CM

## 2024-08-17 DIAGNOSIS — I420 Dilated cardiomyopathy: Principal | ICD-10-CM

## 2024-08-17 DIAGNOSIS — R0602 Shortness of breath: Principal | ICD-10-CM

## 2024-08-17 DIAGNOSIS — G9340 Encephalopathy, unspecified: Principal | ICD-10-CM

## 2024-08-17 MED ORDER — RIFAXIMIN 550 MG TABLET
ORAL_TABLET | Freq: Two times a day (BID) | ORAL | 3 refills | 90.00000 days
Start: 2024-08-17 — End: 2025-08-12

## 2024-08-17 NOTE — Unmapped (Signed)
 Pt requesting refills for rifAXIMin  and pantoprazole  to be sent in to his [facility] pharmacy

## 2024-08-17 NOTE — Unmapped (Signed)
 ASSESSMENT / PLAN  Thomas Rosales is a 83 y.o. 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    1.  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  Reviewed latest echocardiogram 03/2023 showing normal ejection fraction with grade 1 diastolic dysfunction, did have echocardiogram 4/15 with EF 35 to 40%.  No CAD noted on subsequent coronary angiography at that time.  Electrocardiogram today shows sinus rhythm with occasional PVCs and left bundle branch block.  Also reviewed recent labs 07/2024, CMP shows stable electrolytes and renal function proBNP is surprisingly normal.  Plan to repeat echocardiogram to reevaluate the structure and function of his heart.  We did discuss the role his cirrhosis and hypoalbuminemia may be playing in lower extremity edema.  Currently on spironolactone  managed by GI.  Will not modify diuretic regimen at this time given normal proBNP level but will reevaluate pending results of echocardiogram.  We did discuss elevating legs, utilizing compression stockings, and limiting sodium consumption.    2.  Chest discomfort, left bundle branch block  Patient reports chest discomfort, there is some features that are atypical but also some features that are concerning.  EKG today shows sinus rhythm with left bundle branch block which is a chronic finding.  Recommend myocardial perfusion study to evaluate symptoms further.  Patient agreeable with the plan.      I personally spent 60 minutes face-to-face and non-face-to-face in the care of this patient, which includes all pre, intra, and post visit time on the date of service.  All documented time was specific to the E/M visit and does not include any procedures that may have been performed.      Marinell How, MD  [facility] Internal Medicine--Cardiology  Office [phone]  Office [phone]    ______________________________________________________________________    PCP: Chock Norleen Toribio, PA  Patient: Thomas Rosales  DOB: 27-Jan-1941    SUBJECTIVE  Reason for visit:  HFimpEF  HPI: Thomas Rosales is a 83 y.o. 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    History of Present Illness  He has not had his heart evaluated for about a year and a half and has noticed some minor changes in his health. He experiences significant fatigue and finds it difficult to complete tasks such as taking out the garbage without needing to rest. He also experiences shortness of breath accompanying his fatigue.    He also describes left-sided chest discomfort that is not severe and lasts about a minute to a minute and a half. This discomfort has occurred at rest and is a relatively new symptom. He also notes occasional elbow pain accompanying the chest discomfort. He has a history of irregular heartbeat but does not find it bothersome and denies any recent palpitations. No episodes of syncope have occurred.    He reports swelling in his ankles and legs, which does not extend above the knee. He reports that abdominal swelling usually occurs with Crohn's disease flare-ups. He has been taking  spironolactone  50 mg daily for about a year and a half. He does not regularly monitor his blood pressure but notes it has been around 130/60 mmHg when checked.    He has a history of Crohn's disease, diagnosed after experiencing severe abdominal pain during his service in the [employer]. He underwent a hemicolectomy in 1995 and has a colostomy. He experiences gas and reflux, for which he takes Protonix .    He has a history of cirrhosis and hepatocellular carcinoma, with previous procedures performed on his liver. He is awaiting another procedure for a small spot on his liver.      ______________________________________________________________________  Cardiovascular History & Procedures:    Cath / PCI:  03/2014: No CAD.  Moderate LV dysfunction    CV Surgery:   None    EP Procedures and Devices:  None    Non-Invasive Evaluation(s):  TTE 03/2023: Normal EF, grade 1 diastolic dysfunction  TTE 05/2019: EF 45 to 50%  TTE 4/15: EF 35 to 40%      ______________________________________________________________________  OTHER PAST MEDICAL HISTORY:  Crohn's disease status post hemicolectomy 1995  Recurrent small bowel obstruction  History of nonischemic cardiomyopathy reportedly attributed to anti-TNF therapy  MASLD cirrhosis  HCC  Hypertension    ______________________________________________________________________   MEDICATIONS:  Reviewed in Epic:  Current Medications[1]    ______________________________________________________________________   ALLERGIES:  Reviewed in Epic:  Allergies[2]    ______________________________________________________________________  SOCIAL HISTORY:  He lives with his wife and 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 He quit smoking around age 59 after smoking for about 20 years and was a social drinker, with heavier drinking during his service in [place].   ______________________________________________________________________  PERTINENT CARDIOVASCULAR FAMILY HISTORY:  Mother had a pacemaker    ______________________________________________________________________  OBJECTIVE  BP 160/68 (BP Site: L Arm, BP Position: Sitting, BP Cuff Size: Medium)  - Pulse 73  - Temp 36.3 ??C (97.4 ??F) (Temporal)  - Ht 167.6 cm (5' 6)  - Wt 74.4 kg (164 lb)  - SpO2 96%  - BMI 26.47 kg/m??     PHYSICAL EXAMINATION:   GENERAL:  Alert, NAD  CARDIOVASCULAR:  Regular rate and rhythm with occasional ectopy, normal S1/S2, no murmurs, rubs, or gallops.  1+ pitting bilateral lower extremity edema.  RESPIRATORY:  Clear to auscultation bilaterally.  No wheezes, crackles, or rhonchi. Normal work of breathing.  NEUROLOGIC:  CN III-XII in tact, motor exam grossly non-focal.  PSYCH:  Normal mental status, mood, and affect.      ______________________________________________________________________  EKG  Sinus rhythm, left bundle branch block, PVCs    OTHER PERTINENT LABS/STUDIES:  Lab Results   Component Value Date    INR 1.18 07/28/2024    INR 1.0 06/09/2023    BNP 585.91 (H) 11/16/2022    PRO-BNP 148.0 08/06/2024    Creatinine 1.13 07/28/2024    Creatinine 1.18 06/09/2023    Creatinine Whole Blood, POC 1.3 08/02/2022    Potassium 4.5 07/28/2024    Potassium 4.3 06/09/2023    BUN 17 07/28/2024    BUN 29 (H) 06/09/2023           Chemistry        Component Value Date/Time    NA 142 07/28/2024 1236    NA 142 06/09/2023 1312    K 4.5 07/28/2024 1236    K 4.3 06/09/2023 1312    CL 105 07/28/2024 1236  CL 113 (H) 06/09/2023 1312    CO2 23.9 07/28/2024 1236    CO2 17 (L) 06/09/2023 1312    BUN 17 07/28/2024 1236    BUN 29 (H) 06/09/2023 1312    CREATININE 1.13 07/28/2024 1236    CREATININE 1.18 06/09/2023 1312    CREATININE 1.3 08/02/2022 1216    GLU 126 07/28/2024 1236        Component Value Date/Time    CALCIUM 9.2 07/28/2024 1236    CALCIUM 9.1 06/09/2023 1312    ALKPHOS 199 (H) 07/28/2024 1236    ALKPHOS 239 (H) 06/09/2023 1312    AST 70 (H) 07/28/2024 1236    AST 53 (H) 06/09/2023 1312    ALT 53 (H) 07/28/2024 1236    ALT 44 06/09/2023 1312    BILITOT 1.1 07/28/2024 1236    BILITOT 0.5 06/09/2023 1312               [1]   Current Outpatient Medications   Medication Sig Dispense Refill    blood sugar diagnostic (GLUCOSE BLOOD) Strp Check blood sugar as directed once a day and for symptoms of high or low blood sugar. 100 strip 0    blood-glucose meter kit Use as instructed 1 each 0    cholecalciferol, vitamin D3-25 mcg, 1,000 unit,, 25 mcg (1,000 unit) capsule Take 1 capsule (25 mcg total) by mouth two (2) times a day.      EPINEPHrine  (EPIPEN ) 0.3 mg/0.3 mL injection Inject 0.3 mL (0.3 mg total) under the skin once. (Patient taking differently: Inject 0.3 mL (0.3 mg total) under the skin once. prn)      magnesium oxide 250 mg (150 mg elemental) tablet Take 1 tablet by mouth two (2) times a day.      multivitamin (TAB-A-VITE/THERAGRAN) per tablet Take 1 tablet by mouth daily.      ondansetron  (ZOFRAN -ODT) 4 MG disintegrating tablet Dissolve 1 tablet on tongue every 8hours as needed for nausea 30 tablet 4    pantoprazole  (PROTONIX ) 40 MG tablet TAKE 1 TABLET DAILY 90 tablet 0    propylene glycol/peg 400 (SYSTANE OPHT) Administer 1 drop to both eyes daily as needed (dry/itchy eyes).      rifAXIMin  (XIFAXAN ) 550 mg Tab Take 1 tablet (550 mg total) by mouth two (2) times a day. 180 tablet 3    spironolactone  (ALDACTONE ) 50 MG tablet Take 1 tablet (50 mg total) by mouth daily. 90 tablet 3    ustekinumab  (STELARA ) 45 mg/0.5 mL Soln 90mg  subcutaneous every 8weeks 1 mL 5    ustekinumab  (STELARA ) 90 mg/mL Syrg syringe Inject 1 mL (90 mg total) under the skin once.      vit C/E/Zn/coppr/lutein/zeaxan (PRESERVISION AREDS 2 ORAL) Take 1 tablet by mouth two (2) times a day.      white petrolatum (VASELINE) jelly Apply topically daily as needed.       No current facility-administered medications for this visit.   [2] No Known Allergies

## 2024-08-17 NOTE — Unmapped (Addendum)
 We will set you up for a nuclear stress test as well as an echocardiogram (ultrasound of your heart) to evaluate the structure and function of your heart    We discussed limiting sodium, keeping legs elevated, and considering use of compression stockings

## 2024-08-18 DIAGNOSIS — C220 Liver cell carcinoma: Principal | ICD-10-CM

## 2024-08-23 NOTE — Unmapped (Signed)
 September 01, 2024                     Dear Mr. Boehning,     You have been scheduled for a Liver Ablation with Dr. Cathie at [facility] Interventional Radiology.      September 01, 2024  [facility] [address] [place], [place] [address]    Liver Ablation  Please arrive at 9:00 am, the procedure will begin at 10:00 am.        You may take your morning medication with sips of water .  Nothing to eat 8 hours prior to the procedure start time.    You may drink clear liquids until 3 hours prior to the procedure start time.    Clear liquids include water , coffee without cream and sugar, tea, juice and Powerade/Gatorade.   Please do not drink any milk, cream, juice containing pulp, or alcohol.      Because you will receive sedation, please have a family member or friend over the age of 9 drive you to the hospital on the day of the procedure.      Our pre-call nurse will contact you 2-3 days prior to the procedure to review the instructions.       Please check in at the Surgical Services Registration Desk on the day of the procedure.     It was a pleasure speaking with you, please contact me at [phone] with questions and concerns.      Take Care,        Bartholome Canny, BSN, RN, CCTN  Nurse Navigator Oncology IR   [facility] Interventional Radiology [facility]   Desk:[phone]          What to expect after IRE ablation was discussed, questions answered, contact information provided.

## 2024-09-01 ENCOUNTER — Ambulatory Visit: Admitting: Dermatology

## 2024-09-02 ENCOUNTER — Inpatient Hospital Stay: Admit: 2024-09-02 | Discharge: 2024-09-02 | Payer: MEDICARE

## 2024-09-03 ENCOUNTER — Inpatient Hospital Stay: Admit: 2024-09-03 | Discharge: 2024-09-04 | Payer: MEDICARE

## 2024-09-06 ENCOUNTER — Ambulatory Visit: Admitting: Dermatology

## 2024-09-08 ENCOUNTER — Inpatient Hospital Stay: Admit: 2024-09-08 | Discharge: 2024-09-08 | Payer: MEDICARE

## 2024-09-08 ENCOUNTER — Inpatient Hospital Stay: Admit: 2024-09-08 | Discharge: 2024-09-09 | Payer: MEDICARE

## 2024-09-08 DIAGNOSIS — I420 Dilated cardiomyopathy: Principal | ICD-10-CM

## 2024-09-08 DIAGNOSIS — I5020 Unspecified systolic (congestive) heart failure: Principal | ICD-10-CM

## 2024-09-08 DIAGNOSIS — I447 Left bundle-branch block, unspecified: Principal | ICD-10-CM

## 2024-09-08 DIAGNOSIS — R0602 Shortness of breath: Principal | ICD-10-CM

## 2024-09-08 DIAGNOSIS — R0789 Other chest pain: Principal | ICD-10-CM

## 2024-09-08 MED ADMIN — Tc-99m Sestamibi (Cardiolite): 10.9 | INTRAVENOUS | @ 15:00:00 | Stop: 2024-09-08

## 2024-09-08 MED ADMIN — Tc-99m Sestamibi (Cardiolite): 32.9 | INTRAVENOUS | @ 17:00:00 | Stop: 2024-09-08

## 2024-09-08 MED ADMIN — regadenoson (LEXISCAN) injection: .4 mg | INTRAVENOUS | @ 15:00:00 | Stop: 2024-09-08

## 2024-09-10 DIAGNOSIS — G9340 Encephalopathy, unspecified: Principal | ICD-10-CM

## 2024-09-10 MED ORDER — RIFAXIMIN 550 MG TABLET
ORAL_TABLET | Freq: Two times a day (BID) | ORAL | 3 refills | 90.00000 days
Start: 2024-09-10 — End: 2025-09-05

## 2024-09-13 ENCOUNTER — Ambulatory Visit: Admit: 2024-09-13 | Discharge: 2024-09-14 | Payer: MEDICARE

## 2024-09-13 LAB — COMPREHENSIVE METABOLIC PANEL
ALBUMIN: 3.1 g/dL — ABNORMAL LOW (ref 3.4–5.0)
ALKALINE PHOSPHATASE: 199 U/L — ABNORMAL HIGH (ref 46–116)
ALT (SGPT): 64 U/L — ABNORMAL HIGH (ref 10–49)
ANION GAP: 14 mmol/L (ref 5–14)
AST (SGOT): 75 U/L — ABNORMAL HIGH (ref <=34)
BILIRUBIN TOTAL: 1.3 mg/dL — ABNORMAL HIGH (ref 0.3–1.2)
BLOOD UREA NITROGEN: 16 mg/dL (ref 9–23)
BUN / CREAT RATIO: 14
CALCIUM: 9.1 mg/dL (ref 8.7–10.4)
CHLORIDE: 111 mmol/L — ABNORMAL HIGH (ref 98–107)
CO2: 17.5 mmol/L — ABNORMAL LOW (ref 20.0–31.0)
CREATININE: 1.14 mg/dL (ref 0.73–1.18)
EGFR CKD-EPI (2021) MALE: 65 mL/min/1.73m2 (ref >=60)
GLUCOSE RANDOM: 210 mg/dL — ABNORMAL HIGH (ref 70–179)
POTASSIUM: 4.2 mmol/L (ref 3.4–4.8)
PROTEIN TOTAL: 7.7 g/dL (ref 5.7–8.2)
SODIUM: 142 mmol/L (ref 135–145)

## 2024-09-13 LAB — CBC W/ AUTO DIFF
BASOPHILS ABSOLUTE COUNT: 0.1 10*9/L (ref 0.0–0.1)
BASOPHILS RELATIVE PERCENT: 0.8 %
EOSINOPHILS ABSOLUTE COUNT: 0.2 10*9/L (ref 0.0–0.5)
EOSINOPHILS RELATIVE PERCENT: 2.6 %
HEMATOCRIT: 41 % (ref 39.0–48.0)
HEMOGLOBIN: 14.3 g/dL (ref 12.9–16.5)
LYMPHOCYTES ABSOLUTE COUNT: 1 10*9/L — ABNORMAL LOW (ref 1.1–3.6)
LYMPHOCYTES RELATIVE PERCENT: 11.8 %
MEAN CORPUSCULAR HEMOGLOBIN CONC: 34.8 g/dL (ref 32.0–36.0)
MEAN CORPUSCULAR HEMOGLOBIN: 33.6 pg — ABNORMAL HIGH (ref 25.9–32.4)
MEAN CORPUSCULAR VOLUME: 96.6 fL — ABNORMAL HIGH (ref 77.6–95.7)
MEAN PLATELET VOLUME: 9.7 fL (ref 6.8–10.7)
MONOCYTES ABSOLUTE COUNT: 1.3 10*9/L — ABNORMAL HIGH (ref 0.3–0.8)
MONOCYTES RELATIVE PERCENT: 14.6 %
NEUTROPHILS ABSOLUTE COUNT: 6.2 10*9/L (ref 1.8–7.8)
NEUTROPHILS RELATIVE PERCENT: 70.2 %
NUCLEATED RED BLOOD CELLS: 0 /100{WBCs} (ref <=4)
PLATELET COUNT: 140 10*9/L — ABNORMAL LOW (ref 150–450)
RED BLOOD CELL COUNT: 4.24 10*12/L — ABNORMAL LOW (ref 4.26–5.60)
RED CELL DISTRIBUTION WIDTH: 15.2 % (ref 12.2–15.2)
WBC ADJUSTED: 8.8 10*9/L (ref 3.6–11.2)

## 2024-09-13 LAB — PROTIME-INR
INR: 1.26
PROTIME: 14.4 s — ABNORMAL HIGH (ref 9.9–12.6)

## 2024-09-13 LAB — AFP TUMOR MARKER: AFP-TUMOR MARKER: 7 ng/mL (ref <=8)

## 2024-09-13 NOTE — Unmapped (Signed)
 The patient is requesting a medication refill

## 2024-09-16 NOTE — Unmapped (Signed)
 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   3) Prognosis and future surveillance discussed.  4) Letter with treatment outcome sent to referring provider.       Photodamage/future skin cancer risk: evidenced by rhytids, telangiectasias, and lentigines, and history of skin cancer  1) Currently stable, no concerning lesions on exam today. Actinic damage-sun protection measures discussed/advised.   2) Advise continued surveillance in addition to photoprotection.        INDICATION FOR MOHS SURGERY: location, size, histology, or recurrence    STAGE I: For the left helix timeout performed at 9:50 a.m. Informed consent obtained. Anesthesia achieved with 0.33% lidocaine  with 1:200,000 epinephrine . ChloraPrep applied. one section(s) excised using Mohs technique (this includes total peripheral and deep tissue margin excision and evaluation with frozen sections, excised and interpreted by the same physician).     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    Stage 2: One section excised using Mohs technique.  Frozen section analysis revealed a negative margin.  [name]      Options for management of the wound were discussed. Given the location and size/depth of the wound, Primary repair planned.  Burow's triangles planned to follow relaxed skin tension lines.  Anesthesia achieved, chloraprep applied and a sterile drape placed.  Burow's triangles were excised.  Undermining carried out widely to full wound width and height, and/or over muscle, cartilage or bone, or a named neurovascular structure in a subcutaneous plane to facilitate tension-free closure.  Layered closure completed using 4.0 Vicryl for buried vertical mattress sutures followed by 5.0 absorbing gut in a running cuticular technique.    Final size of the defect: 2.3 cm   Final length of the repair: 1.8 cm for this portion.    The remaining defect included the helical rim which had detached from previous surgery.  This was prepared for a flipped island flap.  Because of the size of the defect and distortion likely with primary closure, a V-Y advancement flap planned.  Anesthesia achieved, chloraprep applied, and a sterile drape placed.  An inverted v shaped incision carried out to the perichondrium medial and mid subcutaneous tissue lateral.  Bilevel undermining carried out to create a single sling of the arterial arcade on the rim, mobilizing the flap.  Wide undermining carried out around the defect.  Hemostasis achieved with electrocoagulation.  The flap was then lipped 180 degrees, advanced into the defect and sutured in place using 5.0 Monocryl for buried vertical mattress sutures and 5.0 fast absorbing gut in a running cuticular technique.    Final size of the defect 2.3 cm   Final size of the flap 5.1 cm 2    Bandage applied and wound management reviewed.  Return to clinic in one week to assess healing.          Between layers and repair, after thorough electrocautery, pressure bandage applied.  Patient exited procedure room and instructed be available for notification from the Mohs surgery clinic regarding physician interpretation of slides and further instructions as necessary, and remain within 15 - 20 minutes of clinic.      HISTORY OF PRESENT ILLNESS: Mr. Ertl is seen in consultation at the request of Dr. Claudene for biopsy-proven [facility].  They note that the area has been present for about a few months  increasing in size with bleeding .  There is a history of previous treatment.  Reports no other new or changing lesions and has no other complaints today.      REVIEW OF SYSTEMS: No fevers, chills, weight loss, lymphadenopathy or other skin concerns.    PHYSICAL EXAMINATION:  GENERAL:  well-appearing in no acute distress, alert, appropriately oriented and interactive.  SKIN: Examination of the relevant anatomic areas notable for a 1.3 cm pink plaque on left helical rim. There are rhytids, telangiectasias, and lentigines, consistent with photodamage.    Biopsy report(s) reviewed, confirming the diagnosis.

## 2024-09-16 NOTE — Unmapped (Signed)
 Caprice Beaver, M.D.  [facility] Mohs Surgery  SUTURED WOUND / SKIN GRAFT CARE    Avoid all strenuous activity for one week. You may resume moderate activity in 48 hours.     Plan to sleep with your wound elevated. Avoid bending over if the wound is on your head, as this can cause bleeding to occur.    Take Tylenol or your prescribed medication as needed for discomfort. Do NOT take both (do not exceed two 325mg  tablets of Tylenol every 4 hours).    No alcoholic beverages for 48 hours.    Avoid smoking, as it is detrimental to wound healing.    Keep the WHITE pressure bandage on for 24 hours; this helps prevent bleeding. If the bandage becomes blood-tinged or loose, reinforce it with gauze and tape. Remove after 24 hours.    Leave the flat, inner BROWN bandage in place for one week or until you come in for your follow up appointment. If the bandage becomes blood-tinged or loose, reinforce it with gauze and tape.    If active bleeding occurs:  Use tightly rolled up gauze or cloth to apply direct pressure over the bandage for 20 minutes.  Reapply pressure for an additional 20 minutes if necessary.  If two rounds of pressure fail to stop the bleeding, call [phone] during office hours. For after hours emergencies within the first 48 hours of surgery, call [phone] or go to the nearest emergency room (avoid urgent care)  Use additional gauze and tape to maintain pressure once the bleeding has stopped.    Keep the bandages dry.     Wound Healing:  It is normal to have swelling and bruising around the surgical site. The bruising will fade in approximately 10-14 days. Elevate the area to reduce swelling.  Numbness, itchiness and sensitivity to temperature changes can occur after surgery and may take up to 18 months to normalize.  Although wounds may appear healed within a week, it takes at least 3 months for complete healing and 12 months for a scar to take its final appearance.  Post-operative pain should slowly get better, beginning the evening after surgery.  A sudden or severe increase in pain may indicate a problem. Call our office if this occurs.  For skin grafts, avoid prolonged exposure to extremely cold temperatures for 3 weeks.    If you experience bleeding after clinic hours, call Dr. Merritt???s cell phone: [phone].   For all other questions or concerns, call the Mohs clinic: [phone]              PAIN CONTROL: We recommend taking non-prescription pain control medications for any post-surgical discomfort you may have. If you are able to take acetaminophen (Tylenol) and ibuprofen (Motrin), a combination that works well is to take two regular strength ibuprofen (400 mg  Total) and then 2 hours later take one regular strength acetaminophen (325 mg total). Then as you need to take additional doses, continue to alternate every 3 hours. You should be repeating each medicine about 6 hours after the last dose.      Keep well hydrated after your surgery and please do not to exceed 3,000 mg of acetaminophen during a 24 hour period.  Keep in mind that acetaminophen can also be found in many over-the-counter cold medications as well as prescription strength pain medications. If you are taking or have been given a prescription pain medication that contains acetaminophen (Norco, Vicodin, Percocet, among others) do not  take additional acetaminophen by itself.          ONE WEEK AFTER SURGERY    Begin daily wound care as follows:  Clean the area with soapy water using a Q-tip or gauze pad (shower/bathe normally).  Dry the wound with a Q-tip or gauze pad.  Apply Vaseline ointment with a Q-tip.   * Do NOT use Neosporin ointment *  Cover the wound with a Band-Aid or non-stick gauze pad and paper tape.  Repeat wound care once a day for one week.     After one week, continue to apply ointment until fully healed.     You may resume your regular skin care routine, including applying moisturizer, make-up and sunscreen.    Massaging the area will help the scar soften and fade more quickly. Begin to massage the area one month after the bandages have been removed. To massage, apply pressure directly and firmly over the scar with the fingertips and move in a circular motion. Massage the area several times a day for a few minutes at a time.    Wound Healing:  Once the bandages are removed, the scar will be red and firm (especially in the lip/chin area). This is normal and will fade in time. It might take 6-12 months for this to happen.  Approximately 6-8 weeks after surgery, it is not uncommon to see the formation of ???tender, pimple like??? bumps along the scar. As the scar continues to mature and the stitches underneath the skin begin to dissolve, this might occur. Do not pick or squeeze the bumps; they will resolve on their own. Should one break open, producing a small amount of drainage, apply Polysporin or Bacitracin ointment a few times a day until the wound is completely healed.  Numbness in the surgical area is expected. It might take 12-18 months for sensation to return to normal. During this time, itchiness, tingling, and occasional sharp pains might occur. These sensations are normal and will subside once the nerves have completely healed.    For any questions or concerns, call the Mohs Clinic: [phone]

## 2024-09-17 NOTE — Unmapped (Signed)
 VIR pre liver ablation prep call completed. Reviewed to register at 1020 on ground floor of Surgical hospital then proceed to [facility]nd [facility].  Informed of no show/late cancellation policy. NPO guidelines reviewed. Pt OK to take sips of clear liquids with all AM meds.  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 Made aware of visitation policy.  Pt verbalized understanding. All questions answered.

## 2024-09-21 ENCOUNTER — Encounter: Admit: 2024-09-21 | Discharge: 2024-09-21 | Payer: MEDICARE | Attending: Anesthesiology | Primary: Anesthesiology

## 2024-09-21 ENCOUNTER — Inpatient Hospital Stay: Admit: 2024-09-21 | Discharge: 2024-09-21 | Payer: MEDICARE

## 2024-09-21 DIAGNOSIS — C220 Liver cell carcinoma: Principal | ICD-10-CM

## 2024-09-21 MED ORDER — ONDANSETRON HCL 4 MG TABLET
ORAL_TABLET | Freq: Three times a day (TID) | ORAL | 0 refills | 5.00000 days | Status: CP | PRN
Start: 2024-09-21 — End: 2024-09-28
  Filled 2024-09-21: qty 15, 5d supply, fill #0

## 2024-09-21 MED ORDER — OXYCODONE 5 MG TABLET
ORAL_TABLET | Freq: Four times a day (QID) | ORAL | 0 refills | 3.00000 days | Status: CP | PRN
Start: 2024-09-21 — End: 2024-09-26
  Filled 2024-09-21: qty 10, 3d supply, fill #0

## 2024-09-21 MED ORDER — LEVOFLOXACIN 500 MG TABLET
ORAL_TABLET | Freq: Every day | ORAL | 0 refills | 7.00000 days | Status: CP
Start: 2024-09-21 — End: 2024-09-28
  Filled 2024-09-21: qty 7, 7d supply, fill #0

## 2024-09-21 MED ADMIN — propofol (DIPRIVAN) infusion 10 mg/mL: INTRAVENOUS | @ 18:00:00 | Stop: 2024-09-21

## 2024-09-21 MED ADMIN — fentaNYL (PF) (SUBLIMAZE) injection: INTRAVENOUS | @ 19:00:00 | Stop: 2024-09-21

## 2024-09-21 MED ADMIN — ROCuronium (ZEMURON) injection: INTRAVENOUS | @ 18:00:00 | Stop: 2024-09-21

## 2024-09-21 MED ADMIN — ampicillin-sulbactam (UNASYN) 3 g in sodium chloride 0.9 % (NS) 100 mL IVPB-MBP: 3 g | INTRAVENOUS | @ 18:00:00 | Stop: 2024-09-21

## 2024-09-21 MED ADMIN — fentaNYL (PF) (SUBLIMAZE) injection: INTRAVENOUS | @ 20:00:00 | Stop: 2024-09-21

## 2024-09-21 MED ADMIN — oxyCODONE (ROXICODONE) immediate release tablet 5 mg: 5 mg | ORAL | @ 21:00:00 | Stop: 2024-09-21

## 2024-09-21 MED ADMIN — Propofol (DIPRIVAN) injection: INTRAVENOUS | @ 18:00:00 | Stop: 2024-09-21

## 2024-09-21 MED ADMIN — lidocaine (PF) (XYLOCAINE-MPF) 20 mg/mL (2 %) injection: INTRAVENOUS | @ 18:00:00 | Stop: 2024-09-21

## 2024-09-21 MED ADMIN — sugammadex (BRIDION) injection: INTRAVENOUS | @ 20:00:00 | Stop: 2024-09-21

## 2024-09-21 MED ADMIN — ondansetron (ZOFRAN) injection: INTRAVENOUS | @ 20:00:00 | Stop: 2024-09-21

## 2024-09-21 MED ADMIN — ePHEDrine (PF) 25 mg/5 mL (5 mg/mL) in 0.9% sodium chloride syringe: INTRAVENOUS | @ 18:00:00 | Stop: 2024-09-21

## 2024-09-21 MED ADMIN — iohexol (OMNIPAQUE) 300 mg iodine/mL solution 92 mL: 92 mL | @ 20:00:00 | Stop: 2024-09-21

## 2024-09-21 MED ADMIN — dexAMETHasone (DECADRON) 4 mg/mL injection: INTRAVENOUS | @ 18:00:00 | Stop: 2024-09-21

## 2024-09-21 MED ADMIN — fentaNYL (PF) (SUBLIMAZE) injection 25 mcg: 25 ug | INTRAVENOUS | @ 21:00:00 | Stop: 2024-09-21

## 2024-09-21 MED ADMIN — lactated Ringers infusion: INTRAVENOUS | @ 17:00:00 | Stop: 2024-09-21

## 2024-09-21 MED ADMIN — ROCuronium (ZEMURON) injection: INTRAVENOUS | @ 19:00:00 | Stop: 2024-09-21

## 2024-09-21 MED ADMIN — lidocaine (PF) (XYLOCAINE-MPF) 10 mg/mL (1 %) injection: INTRADERMAL | @ 19:00:00 | Stop: 2024-09-21

## 2024-09-21 MED ADMIN — fentaNYL (PF) (SUBLIMAZE) injection: INTRAVENOUS | @ 18:00:00 | Stop: 2024-09-21

## 2024-09-21 MED ADMIN — ROCuronium (ZEMURON) injection: INTRAVENOUS | @ 20:00:00 | Stop: 2024-09-21

## 2024-09-21 NOTE — Unmapped (Cosign Needed)
 [facility] INTERVENTIONAL RADIOLOGY - Operative Note     VIR Post-Procedure Note    Procedure Name: Hepatic segment 5 mass cryoablation    Pre-Op Diagnosis: Segment 5 LR-TR viable lesion    Post-Op Diagnosis: Same as pre-operative diagnosis    VIR Providers    Attending Physician: Dr. Marolyn Boas    Fellow/Resident: Antonina SHAUNNA Camp, MD    Time out: Prior to the procedure, a time out was performed with all team members present. During the time out, the patient, procedure and procedure site when applicable were verbally verified by the team members and Antonina SHAUNNA Camp, MD.    Description of procedure:   - Pre-procedural scan demonstrates a slightly hypodense mass of segment 5 with peripheral and nodular enhancement anteriorly.   - Successful CT guided cryoablation of the segment 5 mass correlating to location of known LR-TR viable lesion.    Sedation:General Anesthesia    Estimated Blood Loss: approximately 3 mL  Complications: None    See detailed procedure note with images in PACS [facility]).    The patient tolerated the procedure well without incident or complication and left the room in stable condition.    Antonina SHAUNNA Camp, MD  09/21/2024 4:26 PM

## 2024-09-21 NOTE — Unmapped (Signed)
 [facility]OLOG[facility] - Pre Procedure H/P  Patient name: Thomas Rosales  CSN: [id number]  MRN: [id number]  Date of Procedure: @TODAY @      Assessment/Plan:    Mr. Basu is a 83 y.o. male who will undergo IRA vs Cryoablation of the liver mass in Interventional Radiology.    Consent obtained in the Pre Op holding area by Dr. Cathie.  Risks, benefits, and alternatives including but not limited to bleeding, scarring, incomplete treatment zone were discussed with patient/patient's representative. All questions were answered to patient/patient's representative satisfaction.  Patient/Patient's representative consents and would like to proceed with the procedure.   --The patient will accept blood products in an emergent situation.  --The patient does not have a Do Not Resuscitate order in effect.      HPI: Mr. Reap is a 83 y.o. 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 No acute complaints. NPO. Has ride home.     Past Medical History[1]    Past Surgical History[2]     Allergies: Allergies[3]    Medications:  No relevant medications, please see full medication list in Epic.    ASA Grade: ASA 2 - Patient with mild systemic disease with no functional limitations    PE:    There were no vitals filed for this visit.  General: male in NAD.  Airway assessment: Anesthesia to assess.   Lungs: Respirations nonlabored             [1]   Past Medical History:  Diagnosis Date    Anal fissure 1983    Apnea, sleep     uses CPAP at night. instructed to bring DOS.     Basal cell carcinoma     Cancer    (CMS-HCC)     skin cancer    CHF (congestive heart failure) (CMS-HCC)     Cirrhosis    (CMS-HCC) 04/15/2023    Crohn's disease    (CMS-HCC)     Diverticulitis of colon     Fatty liver     Gallstones     GERD (gastroesophageal reflux disease)     H/O echocardiogram     Hypertension     Irritable bowel syndrome     Kidney stone     NASH (nonalcoholic steatohepatitis)     Syncope dizziness    Type 2 diabetes mellitus, without long-term current use of insulin  (CMS-HCC) 10/24/2022   [2]   Past Surgical History:  Procedure Laterality Date    APPENDECTOMY      BOWEL RESECTION  1981, 1984, 1995    3 ft small intestine, <1 ft large intestines, cecum removed; 2nd bowel resection anal fistulas removed; 3rd bowel resection most of colon removed, colostomy placed.    CARDIAC CATHETERIZATION      CARDIOVASCULAR STRESS TEST      CHOLECYSTECTOMY      COLON SURGERY      Same as small bowel surgery    COLOSTOMY  1995    EYE SURGERY  1/20 & 4/21    Cataract & vitrious removal    FRACTURE SURGERY  1961    Broken tibia and fibula    HERNIA REPAIR  2019    Done by Dr Day    IR ABLATION CRYO FIBRO W US  GUIDE  03/03/2024    IR ABLATION CRYO FIBRO W US  GUIDE 03/03/2024 Cathie Rue, MD IMG VIR [facility]    IR EMBOLIZATION ORGAN ISCHEMIA, TUMORS, INFAR  09/26/2023    IR EMBOLIZATION ORGAN ISCHEMIA, TUMORS, INFAR 09/26/2023 Cathie Rue, MD IMG VIR [facility]    IR EMBOLIZATION ORGAN ISCHEMIA, TUMORS, INFAR  12/16/2023    IR EMBOLIZATION ORGAN ISCHEMIA, TUMORS, INFAR 12/16/2023 Cathie Rue, MD IMG VIR H&[facility]    LAPAROSCOPIC SMALL BOWEL RESECTION      LEG SURGERY Left     pins in shin    LITHOTRIPSY      PR COLONOSCOPY FLX DX W/COLLJ SPEC WHEN PFRMD  03/19/2018    Procedure: COLONOSCOPY WITH POSSIBLE BIOPSY, POSSIBLE POLYPECTOMY.;  Surgeon: Elsie Jackquline Crooked, MD;  Location: ENDO OR [facility];  Service: Gastroenterology    PR COLSC FLEXIBLE W/TRANSENDOSCOPIC BALLOON DILAT N/A 09/13/2022    Procedure: COLONOSCOPY, FLEXIBLE; WITH DILATION BY BALLOON, 1 OR MORE STRICTURES;  Surgeon: Conny Dorn Agent, MD;  Location: [facility] PROCED[facility];  Service: Gastroenterology    PR INCISION & DRAINAGE ABSCESS SIMPLE/SINGLE N/A 08/11/2018    Procedure: INCISION AND DRAINAGE OF ABDOMINAL WALL ABSCESS;  Surgeon: Elveria Lewandowsky Day, MD;  Location: OR [place];  Service: General Surgery    PR LAP, INCISIONAL HERNIA REPAIR,REDUCIBLE N/A 06/02/2018    Procedure: LAPAROSCOPIC ASSISTED PARASTOMAL HERNIA REPAIR AND INCISIONAL HERNIA REPAIR WITH MESH;  Surgeon: Elveria Lewandowsky Day, MD;  Location: OR [place];  Service: General Surgery    PR VITRECTOMY,MECHANICAL Right 03/28/2020    Procedure: trans pars planar vitrectomy, right eye   ;  Surgeon: Olam Roger, MD;  Location: [facility] OR [place];  Service: Ophthalmology    PR XCAPSL CTRC RMVL INSJ IO LENS PROSTH W/O ECP Right 01/13/2019    Procedure: PHACOEMULSIFICATION OF CATARACT WITH INSERTION OF INTRAOCULAR LENS/TORIC LENS/ORA;  Surgeon: Jerel Jama Pear, MD;  Location: OR [facility];  Service: Ophthalmology    PR XCAPSL CTRC RMVL INSJ IO LENS PROSTH W/O ECP Left 01/27/2019    Procedure: PHACOEMULSIFICATION OF CATARACT WITH INSERTION OF INTRAOCULAR LENS/TORIC LENS/ORA;  Surgeon: Jerel Jama Pear, MD;  Location: OR [facility];  Service: Ophthalmology    RESECTION SMALL BOWEL / CLOSURE ILEOSTOMY  1981    temporary ileostomy     REVISION COLOSTOMY  2009    SMALL INTESTINE SURGERY  3x since 1984    Crohn???s disease    URETERAL STENT PLACEMENT  2008   [3] No Known Allergies

## 2024-09-22 NOTE — Unmapped (Signed)
 Post-procedure phone call completed; patient is doing well without complications noted. Encouraged to follow discharge instructions still. Patient verbalized understanding.

## 2024-09-22 NOTE — Unmapped (Signed)
 Mr. Nishi reports he is sore and feels tired, but he is doing okay overall.  I discussed follow up.  Questions answered, contact information provided.

## 2024-09-23 NOTE — Unmapped (Signed)
 REASON FOR VISIT: Followup status-post Mohs surgery for BCC of the left helix repaired with a flipped island flap/wedge    HISTORY OF PRESENT ILLNESS: Returns as above.    PHYSICAL EXAMINATION: Well-healing surgical site.    ASSESSMENT AND PLAN: Status post Mohs surgery as above. The  site is healing well.     1) Bandage reapplied and wound management reviewed.  2) Return to clinic in 6 weeks.

## 2024-09-24 DIAGNOSIS — G9340 Encephalopathy, unspecified: Principal | ICD-10-CM

## 2024-09-24 MED ORDER — RIFAXIMIN 550 MG TABLET
ORAL_TABLET | Freq: Two times a day (BID) | ORAL | 3 refills | 90.00000 days | Status: CP
Start: 2024-09-24 — End: 2025-09-19

## 2024-10-14 MED ORDER — PANTOPRAZOLE 40 MG TABLET,DELAYED RELEASE
ORAL_TABLET | Freq: Every day | ORAL | 3 refills | 0.00000 days
Start: 2024-10-14 — End: ?

## 2024-10-15 MED ORDER — PANTOPRAZOLE 40 MG TABLET,DELAYED RELEASE
ORAL_TABLET | Freq: Every day | ORAL | 0 refills | 90.00000 days | Status: CP
Start: 2024-10-15 — End: ?

## 2024-10-15 NOTE — Telephone Encounter (Signed)
 Patient is requesting the following refill  Requested Prescriptions     Pending Prescriptions Disp Refills    pantoprazole  (PROTONIX ) 40 MG tablet [Pharmacy Med Name: PANTOPRAZOLE  SODIUM DR TABS 40MG ] 90 tablet 3     Sig: TAKE 1 TABLET DAILY       Recent Visits  Date Type Provider Dept   08/06/24 Office Visit Wendy Norleen Sieving, [place] [facility] Internal Medicine   Showing recent visits within past 365 days and meeting all other requirements  Future Appointments  No visits were found meeting these conditions.  Showing future appointments within next 365 days and meeting all other requirements       Labs: Not applicable this refill

## 2024-10-28 NOTE — Telephone Encounter (Signed)
 Updated orders for Stelara  90mg  subcutaneous every 8 weeks requested by [facility]. Orders faxed to [phone].

## 2024-11-08 ENCOUNTER — Ambulatory Visit: Admit: 2024-11-08 | Discharge: 2024-11-09 | Payer: MEDICARE

## 2024-11-08 LAB — CBC W/ AUTO DIFF
BASOPHILS ABSOLUTE COUNT: 0.1 10*9/L (ref 0.0–0.1)
BASOPHILS RELATIVE PERCENT: 1.4 %
EOSINOPHILS ABSOLUTE COUNT: 0.3 10*9/L (ref 0.0–0.5)
EOSINOPHILS RELATIVE PERCENT: 3.8 %
HEMATOCRIT: 39.1 % (ref 39.0–48.0)
HEMOGLOBIN: 13.4 g/dL (ref 12.9–16.5)
LYMPHOCYTES ABSOLUTE COUNT: 1 10*9/L — ABNORMAL LOW (ref 1.1–3.6)
LYMPHOCYTES RELATIVE PERCENT: 10.8 %
MEAN CORPUSCULAR HEMOGLOBIN CONC: 34.3 g/dL (ref 32.0–36.0)
MEAN CORPUSCULAR HEMOGLOBIN: 32.9 pg — ABNORMAL HIGH (ref 25.9–32.4)
MEAN CORPUSCULAR VOLUME: 95.9 fL — ABNORMAL HIGH (ref 77.6–95.7)
MEAN PLATELET VOLUME: 9.6 fL (ref 6.8–10.7)
MONOCYTES ABSOLUTE COUNT: 1.2 10*9/L — ABNORMAL HIGH (ref 0.3–0.8)
MONOCYTES RELATIVE PERCENT: 13.2 %
NEUTROPHILS ABSOLUTE COUNT: 6.3 10*9/L (ref 1.8–7.8)
NEUTROPHILS RELATIVE PERCENT: 70.8 %
NUCLEATED RED BLOOD CELLS: 0 /100{WBCs} (ref <=4)
PLATELET COUNT: 159 10*9/L (ref 150–450)
RED BLOOD CELL COUNT: 4.07 10*12/L — ABNORMAL LOW (ref 4.26–5.60)
RED CELL DISTRIBUTION WIDTH: 15 % (ref 12.2–15.2)
WBC ADJUSTED: 8.9 10*9/L (ref 3.6–11.2)

## 2024-11-08 LAB — COMPREHENSIVE METABOLIC PANEL
ALBUMIN: 2.8 g/dL — ABNORMAL LOW (ref 3.4–5.0)
ALKALINE PHOSPHATASE: 221 U/L — ABNORMAL HIGH (ref 46–116)
ALT (SGPT): 59 U/L — ABNORMAL HIGH (ref 10–49)
ANION GAP: 13 mmol/L (ref 5–14)
AST (SGOT): 76 U/L — ABNORMAL HIGH (ref <=34)
BILIRUBIN TOTAL: 1.4 mg/dL — ABNORMAL HIGH (ref 0.3–1.2)
BLOOD UREA NITROGEN: 14 mg/dL (ref 9–23)
BUN / CREAT RATIO: 12
CALCIUM: 9.1 mg/dL (ref 8.7–10.4)
CHLORIDE: 109 mmol/L — ABNORMAL HIGH (ref 98–107)
CO2: 23.3 mmol/L (ref 20.0–31.0)
CREATININE: 1.13 mg/dL (ref 0.73–1.18)
EGFR CKD-EPI (2021) MALE: 65 mL/min/1.73m2 (ref >=60)
GLUCOSE RANDOM: 146 mg/dL (ref 70–179)
POTASSIUM: 4.6 mmol/L (ref 3.4–4.8)
PROTEIN TOTAL: 7.5 g/dL (ref 5.7–8.2)
SODIUM: 145 mmol/L (ref 135–145)

## 2024-11-08 LAB — PROTIME-INR
INR: 1.31
PROTIME: 14.8 s — ABNORMAL HIGH (ref 9.9–12.6)

## 2024-11-08 LAB — AFP TUMOR MARKER: AFP-TUMOR MARKER: 7 ng/mL (ref <=8)

## 2024-11-10 NOTE — Telephone Encounter (Signed)
 Patients daughter Landry faxed over an fmla form on Monday and she was just making sure we received it. I let Beth know that we do have it and she said if it helps that she can send over a copy of last years that they did if it'll be helpful for who is going to do it. She would like a call back at [phone]

## 2024-11-11 NOTE — Telephone Encounter (Signed)
 Error

## 2024-11-11 NOTE — Telephone Encounter (Signed)
 Thomas Rosales called to request that when the new FMLA form is done that we send it both to her HR dept. (number on cover sheet) and to [facility] personal fax number, [phone].     Thomas Rosales will fax us  the old FMLA form for reference. Thank you! -Elveria

## 2024-11-15 NOTE — Telephone Encounter (Signed)
 [facility] patient:    Forwarded message from infusion nurse:  Received a call from [facility] (ph: [phone]) requesting the Stelara  orders.  Will route to IBD Nurse Coordinator.    -see encounter from Megan in November  -outgoing faxed order for Stelara  sent in November, fax successful

## 2024-11-15 NOTE — Telephone Encounter (Signed)
 Received a call from [facility] [place] (ph: [phone]) requesting the Stelara  orders.    Will route to IBD Nurse Coordinator.

## 2024-11-16 DIAGNOSIS — I447 Left bundle-branch block, unspecified: Principal | ICD-10-CM

## 2024-11-16 DIAGNOSIS — I5020 Unspecified systolic (congestive) heart failure: Principal | ICD-10-CM

## 2024-11-16 NOTE — Progress Notes (Signed)
 Assessment and Plan:   Thomas Rosales is a 83 y.o. 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    1. Heart failure with improved ejection fraction (HFimpEF) (CMS-HCC) (Primary)  Reviewed prior echocardiogram 03/2014 with EF 35 to 40%, no CAD noted on coronary angiography at that time.  Reviewed most recent echocardiogram patient from 08/2024 showing normal ejection fraction.  Does have chronic lower extremity edema.  However, reviewed normal proBNP level from 07/2024.  Most recent labs show albumin  level 2.8.  Suspect lower extremity was related to underlying liver disease.  Currently on spironolactone  managed by GI.  Will not modify diuretic regimen at this time nor initiate any heart failure therapies given this information.    2. LBBB (left bundle branch block)  EKG last visit showed left bundle branch block which appears to be chronic.  He had endorsed some chest discomfort at that time, reviewed results of myocardial perfusion study from 09/2024 which were normal.  No further evaluation indicated.        Return in about 1 year (around 11/16/2025).      Marinell How, MD  [facility] Internal Medicine--Cardiology  Office [phone]  Office [phone]        Subjective:   PCP: Wendy Norleen Sieving, PA  Patient: Thomas Rosales  DOB: 07/24/1941    Reason for visit:  HFimpEF  HPI: Thomas Rosales is a 83 y.o. 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  Since his last visit, he reports feeling well.  He denies any unusual dyspnea on exertion.  He also denies any chest pain, palpitations, or near syncope.  Lower extremity edema is chronic and stable.  He has been engaged in physical therapy which has been going well, they are focusing on balance and strength training.    ______________________________________________________________________    Pertinent Medical History, Cardiovascular History & Procedures:    Pertinent PMH:  Crohn's disease status post hemicolectomy 1995  Recurrent small bowel obstruction  History of nonischemic cardiomyopathy reportedly attributed to anti-TNF therapy  MASLD cirrhosis  HCC    Cath / PCI:  03/2014: No CAD.  Moderate LV dysfunction     CV Surgery:   None     EP Procedures and Devices:  None     Non-Invasive Evaluation(s):  Myocardial perfusion study 09/2024: Normal  TTE 08/2024: Normal EF, grade 1 diastolic dysfunction  TTE 03/2023: Normal EF, grade 1 diastolic dysfunction  TTE 05/2019: EF 45 to 50%  TTE 4/15: EF 35 to 40%    ______________________________________________________________________    Other past medical history, social history, family history, medications, allergies and problem list reviewed in the medical record.    Current cardiac medications include:  Spironolactone  50 mg daily.      Objective:     BP 138/68 (BP Site: L Arm, BP Cuff Size: Medium)  - Pulse 62  - Ht 167.6 cm (5' 6)  - Wt 74.8 kg (165 lb)  - SpO2 95%  - BMI 26.63 kg/m??     PHYSICAL EXAMINATION:   GENERAL:  Alert, NAD  CARDIOVASCULAR:  Regular rate and rhythm, normal S1/S2, no murmurs, rubs, or gallops. 1+ b/l LE edema.  RESPIRATORY:  Clear to auscultation bilaterally.  No wheezes, crackles, or rhonchi. Normal work of breathing.    ______________________________________________________________________    EKG 08/2024 sinus rhythm with left bundle branch block and PVCs      Recent CV pertinent labs:  Lab Results   Component Value Date    INR 1.31 11/08/2024    INR 1.0 06/09/2023    BNP 585.91 (H) 11/16/2022    PRO-BNP 148.0 08/06/2024    Creatinine 1.13 11/08/2024    Creatinine 1.18 06/09/2023 Creatinine Whole Blood, POC 1.3 08/02/2022    Potassium 4.6 11/08/2024    Potassium 4.3 06/09/2023    BUN 14 11/08/2024    BUN 29 (H) 06/09/2023    TSH 3.659 11/16/2022           Chemistry        Component Value Date/Time    NA 145 11/08/2024 1125    NA 142 06/09/2023 1312    K 4.6 11/08/2024 1125    K 4.3 06/09/2023 1312    CL 109 (H) 11/08/2024 1125    CL 113 (H) 06/09/2023 1312    CO2 23.3 11/08/2024 1125    CO2 17 (L) 06/09/2023 1312    BUN 14 11/08/2024 1125    BUN 29 (H) 06/09/2023 1312    CREATININE 1.13 11/08/2024 1125    CREATININE 1.18 06/09/2023 1312    CREATININE 1.3 08/02/2022 1216    GLU 146 11/08/2024 1125        Component Value Date/Time    CALCIUM 9.1 11/08/2024 1125    CALCIUM 9.1 06/09/2023 1312    ALKPHOS 221 (H) 11/08/2024 1125    ALKPHOS 239 (H) 06/09/2023 1312    AST 76 (H) 11/08/2024 1125    AST 53 (H) 06/09/2023 1312    ALT 59 (H) 11/08/2024 1125    ALT 44 06/09/2023 1312    BILITOT 1.4 (H) 11/08/2024 1125    BILITOT 0.5 06/09/2023 1312

## 2024-11-17 NOTE — Telephone Encounter (Signed)
 Pt's daughter Thomas Rosales called requesting update on FMLA form faxed over last Monday. I let her know I would check on the status. [name]

## 2024-11-18 NOTE — Progress Notes (Signed)
 Assessment and Plan:    1) Follow up after Mohs surgery for [facility] of the left ear, repaired with flipped island and FTSG, healing well, mild contour changed  2) Return to clinic here as needed.    Subjective:  Here for follow-up after Mohs surgery as above.  Notes no problems with healing and has no other concerns today.    Physical exam:  Well-healing surgical site.

## 2024-11-18 NOTE — Telephone Encounter (Signed)
 Pt's daughter called back, still awaiting confirmation in regards to FMLA form -- whether pt's care team has everything they need to fill out the form or not. She can be reached for follow up at [phone]

## 2024-11-19 NOTE — Telephone Encounter (Signed)
 Pt's forms given to PCP earlier this week. PCP is working on them.

## 2024-11-19 NOTE — Telephone Encounter (Addendum)
 The patient's daughter, Thomas Rosales called again to check on the status of the FMLA forms that have been submitted for completion.    Thomas Rosales is starting to be concerned about timing due to the upcoming holidays and HR staff availability to address by the end of the year. The form was originally faxed on 11/08/2024. She needs them completed and faxed by 11/24/2024 at the latest.    Please fax the complete form to both [facility] @ [phone] so that she retains a copy for future needs and to HR Dept listed on form @ [phone] Attn Reena Dage     Thomas Rosales's best call back number is [phone].

## 2024-11-23 NOTE — Telephone Encounter (Signed)
 Patients daughter Landry following up to discuss the FMLA form. Said she just wanted to chat about it before its completed. Best call back is [phone]

## 2024-11-25 NOTE — Telephone Encounter (Signed)
 Patients daughter Landry called and said that the FMLA needs to be resubmitted again. She said on page 4 of the form number 10 is left blank. She said this is where the doctor is supposed to check where she was,when and will be absent from work and give the estimated amount of time.

## 2024-12-14 ENCOUNTER — Ambulatory Visit: Admit: 2024-12-14 | Discharge: 2024-12-14 | Payer: MEDICARE | Attending: Gastroenterology | Primary: Gastroenterology

## 2024-12-14 ENCOUNTER — Ambulatory Visit: Admit: 2024-12-14 | Discharge: 2024-12-14 | Payer: MEDICARE

## 2024-12-14 DIAGNOSIS — E559 Vitamin D deficiency, unspecified: Principal | ICD-10-CM

## 2024-12-14 DIAGNOSIS — R5383 Other fatigue: Principal | ICD-10-CM

## 2024-12-14 DIAGNOSIS — R799 Abnormal finding of blood chemistry, unspecified: Principal | ICD-10-CM

## 2024-12-14 DIAGNOSIS — R053 Chronic cough: Principal | ICD-10-CM

## 2024-12-14 DIAGNOSIS — D7589 Other specified diseases of blood and blood-forming organs: Principal | ICD-10-CM

## 2024-12-14 DIAGNOSIS — C220 Liver cell carcinoma: Principal | ICD-10-CM

## 2024-12-14 DIAGNOSIS — D518 Other vitamin B12 deficiency anemias: Principal | ICD-10-CM

## 2024-12-14 DIAGNOSIS — K7682 Hepatic encephalopathy    (CMS-HCC): Principal | ICD-10-CM

## 2024-12-14 DIAGNOSIS — K7460 Unspecified cirrhosis of liver: Principal | ICD-10-CM

## 2024-12-14 LAB — IRON PANEL
IRON SATURATION: 55 % (ref 20–55)
IRON: 161 ug/dL (ref 65–175)
TOTAL IRON BINDING CAPACITY: 292 ug/dL (ref 250–425)

## 2024-12-14 LAB — VITAMIN B12: VITAMIN B-12: 445 pg/mL (ref 211–911)

## 2024-12-14 LAB — FOLATE: FOLATE: >24 ng/mL (ref >=5.4)

## 2024-12-14 LAB — TSH: THYROID STIMULATING HORMONE: 2.686 u[IU]/mL (ref 0.550–4.780)

## 2024-12-14 LAB — FERRITIN: FERRITIN: 159.5 ng/mL (ref 30.0–307.3)

## 2024-12-14 LAB — HOMOCYSTEINE: HOMOCYSTEINE PLASMA: 14 umol/L — ABNORMAL HIGH (ref 3.7–13.9)

## 2024-12-14 MED ORDER — MIRTAZAPINE 15 MG TABLET
ORAL_TABLET | Freq: Every evening | ORAL | 5 refills | 30.00000 days | Status: CP
Start: 2024-12-14 — End: 2025-06-12

## 2024-12-14 NOTE — Progress Notes (Signed)
 [facility]  12/14/2024    Reason for visit: Follow up for MASLD cirrhosis, HCC    Assessment/Plan:    84 y.o. 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     MASLD Cirrhosis, compensated: newly-diagnosed 10/2022, had volume overload but thought to be anasarca rather than true ascites in setting of TPN, long-standing history of steatotic liver disease, risk factors include metabolic syndrome. Course has been complicated by Thomas Rosales treated with TACE and cryoablation but has remained compensated.   - MELD labs q3 months  - High protein diet discussed  Fatigue: discussed today that may be contributed to by liver disease but I think unlikely that this is the main cause. Other possible contributors include OSA vs. Depression vs. Nutritional deficiencies  - Check B12, folic acid, iron  panel, TSH  - Encouraged him to reach out to his PCP to discuss this further. He endorses depression in the past few months too which I encouraged him to talk to his PCP about.   - Trial mirtazapine  15mg  at bedtime for sleep  Chronic cough: encouraged him to discuss with PCP  Ascites/volume: developed significant anasarca in 2023 (no significant ascites) in setting of malnutrition, TPN requirements. No prior paracentesis. Euvolemic today  - Continue <2g Na diet   - Continue spironolactone  50mg  daily  Varices: no prior EGD, not indicated at this time. Normal platelets without imaging signs of portal hypertension.  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 Cannot tolerate lactulose  due to baseline heavy ostomy output. No signs of encephalopathy recently.   - Continue rifaximin   HCC: diagnosed 11/2022 (1.5cm LR-5 lesion in segment 5) which progressed to 2.7cm in 06/2023. S/p TACE 09/26/23, cryoablation 03/03/2024, and repeat cryoablation 09/2023.   - Follow-up MRI scheduled next week  - Trend AFP q80months    Return in about 6 months (around 06/13/2025).      Subjective History of Present Illness   Accompanied by: N/A (unaccompanied)    84 y.o. 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    Abdominal MRI on August 04, 2024, showed an LRTR viable lesion in segment five. He underwent percutaneous cryoablation for hepatocellular carcinoma on September 21, 2024, and is scheduled for repeat MRI next week. Recent labs from November 08, 2024, revealed AST 76, ALT 59, alkaline phosphatase 221, bilirubin 1.4, albumin  2.8, creatinine 1.13, hemoglobin 13.4, INR 1.31, AFP 7, and MELD 3.0 score of twelve. He continues spironolactone  and Xifaxan , with stable swelling. Acid reflux is well controlled on Protonix  40 mg daily, except when doses are missed.    Crohn's disease has been managed with the same regimen for over four years. He has an aggravating fistula adjacent to his ostomy, present for several months, which intermittently opens and closes, causing difficulty achieving a good seal and frequent ostomy bag leakage. He has not sought recent surgical or ostomy nurse evaluation. Over the past month, ostomy output has increased with more rapid food transit, noting that food appears in the bag before finishing meals.    Chronic fatigue and weakness persist, attributed in part to poor sleep. Over the past year, nightly sleep duration has decreased from nearly eight hours  to five to five and a half hours, with frequent awakenings and delayed return to sleep. He has a history of sleep apnea, previously improved with weight loss, but now with recurrent sleep issues. He has not recently followed up with cardiology or completed a new sleep study despite attempts to schedule one.    For the past two months, he has had a persistent, somewhat productive cough that began with cold-like symptoms and sinus congestion. He denies fever. Chronic postnasal drip and lifelong sinus congestion persist, with rhinorrhea worsened by eating. He has a remote history of tobacco use, having quit in the 1980s after twenty years of smoking.    He reports feeling depressed, with loss of interest in previously enjoyed activities and increased sedentary behavior. His caregiver notes a lack of interest in activities.    He attends physical therapy for lower body strengthening and fall prevention, currently on hiatus. Protein intake is adequate, including Premier protein shakes and vitamin D  supplementation. He has a history of iron  deficiency and prior iron  infusions, but is not currently taking iron  supplements.        Objective   Physical Exam   Vital Signs: BP 142/63 (BP Site: L Arm, BP Position: Sitting)  - Pulse 68  - Temp 36.2 ??C (97.2 ??F)  - Ht 167.6 cm (5' 6)  - Wt 73.9 kg (163 lb)  - SpO2 96%  - BMI 26.31 kg/m??   Constitutional: He is in no apparent distress  Eyes: Anicteric sclerae  Cardiovascular: Trace bilateral ankle edema   Gastrointestinal: Non-distended abdomen  Neurologic: Awake, alert, and oriented to person, place, and time with normal speech     Data  Results  Labs  AST (11/08/2024): 76  ALT (11/08/2024): 59  Alk Phos (11/08/2024): 221  Bilirubin (11/08/2024): 1.4  Albumin  (11/08/2024): 2.8  Creatinine (11/08/2024): 1.13  Hemoglobin (11/08/2024): 13.4  INR (11/08/2024): 1.31  AFP (11/08/2024): 7  MELD 3.0 score (11/08/2024): 12    Radiology  Abdominal MRI with and without contrast (08/04/2024): LRTR viable lesion in segment V

## 2024-12-14 NOTE — Progress Notes (Signed)
 Labs ordered by provider. Patient instructed to go downstairs to the first floor and check in at the [facility] for a lab appointment. Patient verbalized understanding.

## 2024-12-15 LAB — VITAMIN D 25 HYDROXY: VITAMIN D, TOTAL (25OH): 19.4 ng/mL — ABNORMAL LOW (ref 20.0–80.0)

## 2024-12-16 MED ORDER — ERGOCALCIFEROL (VITAMIN D2) 1,250 MCG (50,000 UNIT) CAPSULE
ORAL_CAPSULE | ORAL | 0 refills | 56.00000 days | Status: CP
Start: 2024-12-16 — End: 2025-02-10

## 2024-12-16 NOTE — Addendum Note (Signed)
 Addended byBETHA FAE ALTO on: 12/16/2024 03:00 PM     Modules accepted: Orders

## 2024-12-17 NOTE — Telephone Encounter (Signed)
 Spoke to [place], he said he gave the form 2-3 weeks ago to Brittany

## 2024-12-17 NOTE — Telephone Encounter (Signed)
 Hello,    Patients daughter Landry called on 12/18 and said   the FMLA needs to be resubmitted again. She said on page 4 of the form number 10 is left blank. She said this is where the doctor is supposed to check where she was,when and will be absent from work and give the estimated amount of time.    Can you please let Norleen know this detail?

## 2024-12-17 NOTE — Telephone Encounter (Signed)
 Patients daughter Landry is calling again about FMLA forms said she has not heard back from anyone. Seems like she has called several times and several different people have taken call and routed. She is asking for Randall but I am routing to clinical pool as this is something pcp has to correct and accidentally routed to [place] last time. Best call back is [phone]

## 2024-12-17 NOTE — Telephone Encounter (Signed)
 Called Patient's daughter Landry and gave her an update. We agreed when section 10 on the 4th page was completed I would refaxed the complete document to her and HR.

## 2024-12-17 NOTE — Telephone Encounter (Signed)
 Specific sheet of that form placed on John's desk to review on Monday.

## 2024-12-17 NOTE — Telephone Encounter (Signed)
 Hey can you please look at the last few messages I routed. I accidentally routed to [place] because patient kept calling asking to speak with her but this is something Norleen needs to correct she has been calling for the past month and I dont want this to get pushed out. Thank you

## 2024-12-18 LAB — METHYLMALONIC ACID, SERUM: METHYLMALONIC ACID: 0.54 nmol/mL — ABNORMAL HIGH

## 2024-12-21 MED ORDER — CYANOCOBALAMIN (VIT B-12) 2,000 MCG TABLET
ORAL_TABLET | Freq: Every day | ORAL | 5 refills | 0.00000 days | Status: CP
Start: 2024-12-21 — End: 2025-06-19

## 2024-12-21 NOTE — Addendum Note (Signed)
 Addended byBETHA FAE ALTO on: 12/21/2024 12:59 PM     Modules accepted: Orders

## 2024-12-21 NOTE — Telephone Encounter (Signed)
 I was able to confirm that the information list on prior form was correct and could be transcribed to FMLA form dated 11/23/2024 to complete section 10.    Provider Norleen Chock gave me the okay to complete the form on his behalf due to being out sick for several day.    The forms were again faxed to both the patient's daughter Landry Right @ [phone] and to HR Dept Attn Reena Dage @ [phone].    The forms were uploaded to media tab with all the cover sheets and confirmations.    I called and LMOM for Landry Right [phone] confirming the completion for form and faxes.

## 2024-12-22 ENCOUNTER — Inpatient Hospital Stay: Admit: 2024-12-22 | Discharge: 2024-12-22 | Payer: MEDICARE

## 2024-12-22 MED ADMIN — gadopiclenol (ELUCIREM,VUEWAY) injection 6 mL: 6 mL | INTRAVENOUS | @ 16:00:00 | Stop: 2024-12-22

## 2024-12-24 NOTE — Telephone Encounter (Signed)
 LVM regarding telehealth with Dr. Cathie.

## 2024-12-27 ENCOUNTER — Ambulatory Visit: Admitting: Dermatology

## 2024-12-27 ENCOUNTER — Encounter: Payer: Self-pay | Admitting: Dermatology

## 2024-12-27 DIAGNOSIS — L820 Inflamed seborrheic keratosis: Secondary | ICD-10-CM

## 2024-12-27 DIAGNOSIS — D1801 Hemangioma of skin and subcutaneous tissue: Secondary | ICD-10-CM | POA: Diagnosis not present

## 2024-12-27 DIAGNOSIS — W908XXA Exposure to other nonionizing radiation, initial encounter: Secondary | ICD-10-CM | POA: Diagnosis not present

## 2024-12-27 DIAGNOSIS — Z85828 Personal history of other malignant neoplasm of skin: Secondary | ICD-10-CM | POA: Diagnosis not present

## 2024-12-27 DIAGNOSIS — L578 Other skin changes due to chronic exposure to nonionizing radiation: Secondary | ICD-10-CM | POA: Diagnosis not present

## 2024-12-27 DIAGNOSIS — D492 Neoplasm of unspecified behavior of bone, soft tissue, and skin: Secondary | ICD-10-CM

## 2024-12-27 DIAGNOSIS — L814 Other melanin hyperpigmentation: Secondary | ICD-10-CM

## 2024-12-27 DIAGNOSIS — D692 Other nonthrombocytopenic purpura: Secondary | ICD-10-CM

## 2024-12-27 DIAGNOSIS — L729 Follicular cyst of the skin and subcutaneous tissue, unspecified: Secondary | ICD-10-CM

## 2024-12-27 DIAGNOSIS — L570 Actinic keratosis: Secondary | ICD-10-CM

## 2024-12-27 DIAGNOSIS — D229 Melanocytic nevi, unspecified: Secondary | ICD-10-CM

## 2024-12-27 DIAGNOSIS — L821 Other seborrheic keratosis: Secondary | ICD-10-CM

## 2024-12-27 DIAGNOSIS — Z1283 Encounter for screening for malignant neoplasm of skin: Secondary | ICD-10-CM | POA: Diagnosis not present

## 2024-12-27 MED ORDER — PANTOPRAZOLE 40 MG TABLET,DELAYED RELEASE
ORAL_TABLET | Freq: Every day | ORAL | 3 refills | 0.00000 days
Start: 2024-12-27 — End: ?

## 2024-12-27 NOTE — Progress Notes (Signed)
 "  Follow-Up Visit   Subjective  Dean Ryan is a 84 y.o. male who presents for the following: Skin Cancer Screening and Full Body Skin Exam. Hx of SCC. Hx of BCC. Hx of SCCis.   The patient presents for Total-Body Skin Exam (TBSE) for skin cancer screening and mole check. The patient has spots, moles and lesions to be evaluated, some may be new or changing and the patient may have concern these could be cancer.    The following portions of the chart were reviewed this encounter and updated as appropriate: medications, allergies, medical history  Review of Systems:  No other skin or systemic complaints except as noted in HPI or Assessment and Plan.  Objective  Well appearing patient in no apparent distress; mood and affect are within normal limits.  A full examination was performed including scalp, head, eyes, ears, nose, lips, neck, chest, axillae, abdomen, back, buttocks, bilateral upper extremities, bilateral lower extremities, hands, feet, fingers, toes, fingernails, and toenails. All findings within normal limits unless otherwise noted below.   Relevant physical exam findings are noted in the Assessment and Plan.  Left Temple x1 Erythematous keratotic or waxy stuck-on papule or plaque. Left Preauricular Area x1, vertex scalp within hair part x1, L neck x1, L medial cheek x1, R medial cheek x1, R temple x1, R tragus x1, R angle of mandible x1, Post neck x1, R hand x6, R forearm x4, L forearm x3 (22) Erythematous thin papules/macules with gritty scale.  Left Wrist - Posterior 2.5 x 1.4 cm pink cobblestone scaly plaque; biopsy is 5 mm pink papule   Assessment & Plan   SKIN CANCER SCREENING PERFORMED TODAY.  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  - No evidence of recurrence today - No lymphadenopathy - Recommend regular full body skin exams - Recommend daily broad spectrum sunscreen SPF 30+ to sun-exposed areas, reapply every 2 hours as  needed.  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 [facility] 10/15/2023. - No evidence of recurrence today - Recommend regular full body skin exams - Recommend daily broad spectrum sunscreen SPF 30+ to sun-exposed areas, reapply every 2 hours as needed.  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 Gregorio mohs 09/16/24  - No evidence of recurrence today - Recommend regular full body skin exams - Recommend daily broad spectrum sunscreen SPF 30+ to sun-exposed areas, reapply every 2 hours as needed.  - Call if any new or changing lesions are noted between office visits   ACTINIC DAMAGE - Chronic condition, secondary to cumulative UV/sun exposure - diffuse scaly erythematous macules with underlying dyspigmentation - Recommend daily broad spectrum sunscreen SPF 30+ to sun-exposed areas, reapply every 2 hours as needed.  - Staying in the shade or wearing long sleeves, sun glasses (UVA+UVB protection) and wide brim hats (4-inch brim around the entire circumference of the hat) are also recommended for sun protection.  - Call for new or changing lesions.  LENTIGINES, SEBORRHEIC KERATOSES, HEMANGIOMAS - Benign normal skin lesions - Benign-appearing - Call for any changes  MELANOCYTIC NEVI - Tan-brown and/or pink-flesh-colored symmetric macules and papules - Benign appearing on exam today - Observation - Call clinic for new or changing moles - Recommend daily use of broad spectrum spf 30+  sunscreen to sun-exposed areas.       INFLAMED SEBORRHEIC KERATOSIS Left Temple x1 Symptomatic, irritating, patient would like treated. - Destruction of lesion - Left Temple x1 Complexity: simple   Destruction method: cryotherapy   Informed consent: discussed and consent obtained   Timeout:  patient name, date of  birth, surgical site, and procedure verified Lesion destroyed using liquid nitrogen: Yes   Region frozen until ice ball extended beyond lesion: Yes   Cryo cycles: 1 or 2. Outcome: patient tolerated procedure well with no complications   Post-procedure details: wound care instructions given   Additional details:  Prior to procedure, discussed risks of blister formation, small wound, skin dyspigmentation, or rare scar following cryotherapy. Recommend Vaseline ointment to treated areas while healing.   AK (ACTINIC KERATOSIS) (22) Left Preauricular Area x1, vertex scalp within hair part x1, L neck x1, L medial cheek x1, R medial cheek x1, R temple x1, R tragus x1, R angle of mandible x1, Post neck x1, R hand x6, R forearm x4, L forearm x3 (22) Actinic keratoses are precancerous spots that appear secondary to cumulative UV radiation exposure/sun exposure over time. They are chronic with expected duration over 1 year. A portion of actinic keratoses will progress to squamous cell carcinoma of the skin. It is not possible to reliably predict which spots will progress to skin cancer and so treatment is recommended to prevent development of skin cancer.  Recommend daily broad spectrum sunscreen SPF 30+ to sun-exposed areas, reapply every 2 hours as needed.  Recommend staying in the shade or wearing long sleeves, sun glasses (UVA+UVB protection) and wide brim hats (4-inch brim around the entire circumference of the hat). Call for new or changing lesions. - Destruction of lesion - Left Preauricular Area x1, vertex scalp within hair part x1, L neck x1, L medial cheek x1, R medial cheek x1, R temple x1, R tragus x1, R angle of mandible x1, Post neck x1, R hand x6, R forearm x4, L forearm x3 (22) Complexity: simple   Destruction method: cryotherapy   Informed consent: discussed and consent obtained   Timeout:  patient name, date of birth, surgical site, and procedure verified Lesion destroyed using liquid  nitrogen: Yes   Region frozen until ice ball extended beyond lesion: Yes   Cryo cycles: 1 or 2. Outcome: patient tolerated procedure well with no complications   Post-procedure details: wound care instructions given   Additional details:  Prior to procedure, discussed risks of blister formation, small wound, skin dyspigmentation, or rare scar following cryotherapy. Recommend Vaseline ointment to treated areas while healing.   NEOPLASM OF SKIN Left Wrist - Posterior - Skin / nail biopsy Type of biopsy: tangential   Informed consent: discussed and consent obtained   Timeout: patient name, date of birth, surgical site, and procedure verified   Procedure prep:  Patient was prepped and draped in usual sterile fashion Prep type:  Isopropyl alcohol Anesthesia: the lesion was anesthetized in a standard fashion   Anesthetic:  1% lidocaine w/ epinephrine 1-100,000 buffered w/ 8.4% NaHCO3 Instrument used: DermaBlade   Hemostasis achieved with: pressure and aluminum chloride   Outcome: patient tolerated procedure well   Post-procedure details: sterile dressing applied and wound care instructions given   Dressing type: bandage and petrolatum    Specimen 1 - Surgical pathology Differential Diagnosis: healing wound R/O vs SCC  Check Margins: No MULTIPLE BENIGN NEVI   ACTINIC ELASTOSIS   LENTIGINES   CHERRY ANGIOMA   SEBORRHEIC KERATOSES  SOLAR PURPURA   Return in about 6 months (around 06/26/2025) for TBSE, HxSCC, HxSCCis, HxBCC.  I, Jill Parcell, CMA, am acting as scribe for Boneta Sharps, MD.   Documentation: I have reviewed the above documentation for accuracy and completeness, and I agree with the above.  Boneta Sharps, MD    "

## 2024-12-27 NOTE — Patient Instructions (Addendum)
 Cryotherapy Aftercare  Wash gently with soap and water everyday.   Apply Vaseline Jelly daily until healed.    Wound Care Instructions  Cleanse wound gently with soap and water once a day then pat dry with clean gauze. Apply a thin coat of Petrolatum (petroleum jelly, Vaseline) over the wound (unless you have an allergy to this). We recommend that you use a new, sterile tube of Vaseline. Do not pick or remove scabs. Do not remove the yellow or white healing tissue from the base of the wound.  Cover the wound with fresh, clean, nonstick gauze and secure with paper tape. You may use Band-Aids in place of gauze and tape if the wound is small enough, but would recommend trimming much of the tape off as there is often too much. Sometimes Band-Aids can irritate the skin.  You should call the office for your biopsy report after 1 week if you have not already been contacted.  If you experience any problems, such as abnormal amounts of bleeding, swelling, significant bruising, significant pain, or evidence of infection, please call the office immediately.  FOR ADULT SURGERY PATIENTS: If you need something for pain relief you may take 1 extra strength Tylenol  (acetaminophen ) AND 2 Ibuprofen (200mg  each) together every 4 hours as needed for pain. (do not take these if you are allergic to them or if you have a reason you should not take them.) Typically, you may only need pain medication for 1 to 3 days.      Recommend daily broad spectrum sunscreen SPF 30+ to sun-exposed areas, reapply every 2 hours as needed. Call for new or changing lesions.  Staying in the shade or wearing long sleeves, sun glasses (UVA+UVB protection) and wide brim hats (4-inch brim around the entire circumference of the hat) are also recommended for sun protection.      Melanoma ABCDEs  Melanoma is the most dangerous type of skin cancer, and is the leading cause of death from skin disease.  You are more likely to develop  melanoma if you: Have light-colored skin, light-colored eyes, or red or blond hair Spend a lot of time in the sun Tan regularly, either outdoors or in a tanning bed Have had blistering sunburns, especially during childhood Have a close family member who has had a melanoma Have atypical moles or large birthmarks  Early detection of melanoma is key since treatment is typically straightforward and cure rates are extremely high if we catch it early.   The first sign of melanoma is often a change in a mole or a new dark spot.  The ABCDE system is a way of remembering the signs of melanoma.  A for asymmetry:  The two halves do not match. B for border:  The edges of the growth are irregular. C for color:  A mixture of colors are present instead of an even brown color. D for diameter:  Melanomas are usually (but not always) greater than 6mm - the size of a pencil eraser. E for evolution:  The spot keeps changing in size, shape, and color.  Please check your skin once per month between visits. You can use a small mirror in front and a large mirror behind you to keep an eye on the back side or your body.   If you see any new or changing lesions before your next follow-up, please call to schedule a visit.  Please continue daily skin protection including broad spectrum sunscreen SPF 30+ to sun-exposed areas, reapplying every  2 hours as needed when you're outdoors.   Staying in the shade or wearing long sleeves, sun glasses (UVA+UVB protection) and wide brim hats (4-inch brim around the entire circumference of the hat) are also recommended for sun protection.      Due to recent changes in healthcare laws, you may see results of your pathology and/or laboratory studies on MyChart before the doctors have had a chance to review them. We understand that in some cases there may be results that are confusing or concerning to you. Please understand that not all results are received at the same time and often  the doctors may need to interpret multiple results in order to provide you with the best plan of care or course of treatment. Therefore, we ask that you please give us  2 business days to thoroughly review all your results before contacting the office for clarification. Should we see a critical lab result, you will be contacted sooner.   If You Need Anything After Your Visit  If you have any questions or concerns for your doctor, please call our main line at [phone] and press option 4 to reach your doctor's medical assistant. If no one answers, please leave a voicemail as directed and we will return your call as soon as possible. Messages left after 4 pm will be answered the following business day.   You may also send us  a message via MyChart. We typically respond to MyChart messages within 1-2 business days.  For prescription refills, please ask your pharmacy to contact our office. Our fax number is [phone].  If you have an urgent issue when the clinic is closed that cannot wait until the next business day, you can page your doctor at the number below.    Please note that while we do our best to be available for urgent issues outside of office hours, we are not available 24/7.   If you have an urgent issue and are unable to reach us , you may choose to seek medical care at your doctor's office, retail clinic, urgent care center, or emergency room.  If you have a medical emergency, please immediately call 911 or go to the emergency department.  Pager Numbers  - Dr. Hester: [phone]  - Dr. Jackquline: [phone]  - Dr. Claudene: [phone]   - Dr. Raymund: [phone]  In the event of inclement weather, please call our main line at [phone] for an update on the status of any delays or closures.  Dermatology Medication Tips: Please keep the boxes that topical medications come in in order to help keep track of the instructions about where and how to use these. Pharmacies  typically print the medication instructions only on the boxes and not directly on the medication tubes.   If your medication is too expensive, please contact our office at [phone] option 4 or send us  a message through MyChart.   We are unable to tell what your co-pay for medications will be in advance as this is different depending on your insurance coverage. However, we may be able to find a substitute medication at lower cost or fill out paperwork to get insurance to cover a needed medication.   If a prior authorization is required to get your medication covered by your insurance company, please allow us  1-2 business days to complete this process.  Drug prices often vary depending on where the prescription is filled and some pharmacies may offer cheaper prices.  The website www.goodrx.com contains coupons for medications through  different pharmacies. The prices here do not account for what the cost may be with help from insurance (it may be cheaper with your insurance), but the website can give you the price if you did not use any insurance.  - You can print the associated coupon and take it with your prescription to the pharmacy.  - You may also stop by our office during regular business hours and pick up a GoodRx coupon card.  - If you need your prescription sent electronically to a different pharmacy, notify our office through [facility] or by phone at [phone] option 4.     Si Usted Necesita Algo Despus de Su Visita  Tambin puede enviarnos un mensaje a travs de Clinical cytogeneticist. Por lo general respondemos a los mensajes de MyChart en el transcurso de 1 a 2 das hbiles.  Para renovar recetas, por favor pida a su farmacia que se ponga en contacto con nuestra oficina. Randi lakes de fax es [place] [phone].  Si tiene un asunto urgente cuando la clnica est cerrada y que no puede esperar hasta el siguiente da hbil, puede llamar/localizar a su doctor(a) al nmero que  aparece a continuacin.   Por favor, tenga en cuenta que aunque hacemos todo lo posible para estar disponibles para asuntos urgentes fuera del horario de [place], no estamos disponibles las 24 horas del da, los 7 [address]  [address] de la [place].   Si tiene un problema urgente y no puede comunicarse con nosotros, puede optar por buscar atencin mdica  en el consultorio de su doctor(a), en una clnica privada, en un centro de atencin urgente o en una sala de emergencias.  Si tiene Engineer, drilling, por favor llame inmediatamente al 911 o vaya a la sala de emergencias.  Nmeros de bper  - Dr. Hester: [phone]  - Dra. Jackquline: [phone]  - Dr. Claudene: [phone]  - Dra. Kitts: [phone]  En caso de inclemencias del [place], por favor llame a nuestra lnea principal al [phone] para una actualizacin sobre el estado de cualquier retraso o cierre.  Consejos para la medicacin en dermatologa: Por favor, guarde las cajas en las que vienen los medicamentos de uso tpico para ayudarle a seguir las instrucciones sobre dnde y cmo usarlos. Las farmacias generalmente imprimen las instrucciones del medicamento slo en las cajas y no directamente en los tubos del [place].   Si su medicamento es muy caro, por favor, pngase en contacto con landry rieger llamando al [phone] y presione la opcin 4 o envenos un mensaje a travs de Clinical cytogeneticist.   No podemos decirle cul ser su copago por los medicamentos por adelantado ya que esto es diferente dependiendo de la cobertura de su seguro. Sin embargo, es posible que podamos encontrar un medicamento sustituto a Audiological scientist un formulario para que el seguro cubra el medicamento que se considera necesario.   Si se requiere una autorizacin previa para que su compaa de seguros malta su medicamento, por favor permtanos de 1 a 2 das hbiles para completar este proceso.  Los precios de los medicamentos varan con frecuencia  dependiendo del Environmental consultant de dnde se surte la receta y alguna farmacias pueden ofrecer precios ms baratos.  El sitio web www.goodrx.com tiene cupones para medicamentos de Health and safety inspector. Los precios aqu no tienen en cuenta lo que podra costar con la ayuda del seguro (puede ser ms barato con su seguro), pero el sitio web puede darle el precio si no utiliz Tourist information centre manager.  - Puede imprimir el  cupn correspondiente y llevarlo con su receta a la farmacia.  - Tambin puede pasar por nuestra oficina durante el horario de atencin regular y Education officer, museum una tarjeta de cupones de GoodRx.  - Si necesita que su receta se enve electrnicamente a una farmacia diferente, informe a nuestra oficina a travs de MyChart de [place] o por telfono llamando al [phone] y presione la opcin 4.

## 2024-12-29 ENCOUNTER — Ambulatory Visit
Admit: 2024-12-29 | Discharge: 2024-12-30 | Payer: MEDICARE | Attending: Student in an Organized Health Care Education/Training Program | Primary: Student in an Organized Health Care Education/Training Program

## 2024-12-29 DIAGNOSIS — G4733 Obstructive sleep apnea (adult) (pediatric): Principal | ICD-10-CM

## 2024-12-29 DIAGNOSIS — G4700 Insomnia, unspecified: Principal | ICD-10-CM

## 2024-12-29 DIAGNOSIS — R059 Cough, unspecified type: Principal | ICD-10-CM

## 2024-12-29 DIAGNOSIS — R053 Chronic cough: Principal | ICD-10-CM

## 2024-12-29 MED ORDER — IPRATROPIUM BROMIDE 21 MCG (0.03 %) NASAL SPRAY
Freq: Three times a day (TID) | NASAL | 2 refills | 58.00000 days | Status: CP | PRN
Start: 2024-12-29 — End: 2025-12-29

## 2024-12-29 MED ORDER — PANTOPRAZOLE 40 MG TABLET,DELAYED RELEASE
ORAL_TABLET | Freq: Every day | ORAL | 2 refills | 90.00000 days | Status: CP
Start: 2024-12-29 — End: ?

## 2024-12-29 NOTE — Progress Notes (Signed)
 Assessment and Plan  1. OSA (obstructive sleep apnea)    2. Insomnia, unspecified type    3. Cough, unspecified type    4. Chronic cough      OSA  Hx of OSA on CPAP. Needs new machine.  --Order for AutoPap placed through [facility]; can order home sleep study if update required by insurance.  --Agree with Remeron  as prescribed by hepatology.    Cough  Productive cough x4-5 months. Rhinorrhea when eating. Lungs CTAB, no significant oropharyngeal erythema. Immunosuppressed on Stelara .   5lb weight gain in past week per pt, on spironolactone .  Grade I diastolic dysfunction noted on echocardiogram 08/2024.  --R/o pneumonia with chest CT.  --Trial ipratropium nasal spray for rhinorrhea.     Requested Prescriptions     Signed Prescriptions Disp Refills    ipratropium (ATROVENT ) 21 mcg (0.03 %) nasal spray 30 mL 2     Sig: 2 sprays into each nostril Three (3) times a day as needed for rhinitis.     There are no discontinued medications.    No follow-ups on file.  Signs and symptoms that should prompt return sooner than scheduled were reviewed with the patient.        HPI    Chief Complaint  Chief Complaint   Patient presents with    Follow-up     Pt c/o issues with cpap and irregular sleep pattern. Pt also has ongoing cough for the past 4-5 months that is sometimes productive. He would like to discuss his recent visit with the hepatologist.      Thomas Rosales is here for follow up.    OSA  Hx of sleep apnea, currently on CPAP (not AutoPap). Machine is fairly old and does not seem to be working correctly. Was referred to a sleep clinic in September but never heard back regarding scheduling.   Recently started Remeron  15 mg through his hepatologist, which seems to have improved his ability to stay asleep through the night.    Cough  Reports cough x4-5 months, which has gradually worsened in frequency and in amount of mucus produced. Initially felt more like throat irritation from post-nasal drip. Notes significant rhinorrhea when eating. Notes his mother had a chronic cough. Denies SOB.  Hx of GERD which is well-controlled on pantoprazole . Only gets active reflux if he stays off of it for 4-5 days. Stopped eating close to bedtime.  Smoked for ~20 years, quit in the 1980s. Notes had a normal spirometry in the past. Reports limited expansion of ribcage/lungs due to hx of ankylosing spondylitis.   Notably he is immunosuppressed on Stelara .   Has gained 5 lbs which he believes to be fluid retention. Had been eating more salty foods. On spironolactone .     Allergies:  Patient has no known allergies.    Medications:   Medications Prior to Visit[1]    Medical History:  Past Medical History[2]    Surgical History:  Past Surgical History[3]    Social History:  Tobacco use:   reports that he quit smoking about 40 years ago. His smoking use included cigarettes. He started smoking about 60 years ago. He has a 20 pack-year smoking history. He has never used smokeless tobacco.  Alcohol use:   reports that he does not currently use alcohol.  Drug use:  reports no history of drug use.      Family History:  Family History[4]        Review of Systems:  ROS:  A  comprehensive 10+ review of systems was negative unless otherwise stated in the HPI.         Vitals:    12/29/24 1520   BP: 142/70   BP Site: L Arm   BP Position: Sitting   BP Cuff Size: Medium   Pulse: 72   Temp: 36.4 ??C (97.5 ??F)   TempSrc: Temporal   SpO2: 99%   Weight: 75.9 kg (167 lb 4.8 oz)       Physical Exam  General: alert, oriented, no distress  HEENT: Anicteric sclerae  RESP: Relaxed respiratory effort. Lungs CTAB.    SKIN: Appropriately warm and dry  NEURO: no focal deficits,   EXT:  No peripheral edema      Medication adherence and barriers to the treatment plan have been addressed. Opportunities to optimize healthy behaviors have been discussed. Patient / caregiver voiced understanding.        I attest that I, Geryl Rase, personally documented this note while acting as scribe for Norleen JONETTA Chock, [place].      Geryl Rase, Scribe.  12/29/2024     The documentation recorded by the scribe accurately reflects the service I personally performed and the decisions made by me.    John D Vrnak, PA        [1]   Outpatient Medications Prior to Visit   Medication Sig Dispense Refill    blood sugar diagnostic (GLUCOSE BLOOD) Strp Check blood sugar as directed once a day and for symptoms of high or low blood sugar. 100 strip 0    blood-glucose meter kit Use as instructed 1 each 0    cholecalciferol, vitamin D3-25 mcg, 1,000 unit,, 25 mcg (1,000 unit) capsule Take 1 capsule (25 mcg total) by mouth two (2) times a day.      cyanocobalamin , vitamin B-12, 2,000 mcg Tab Take 2,000 mcg by mouth daily. 30 tablet 5    EPINEPHrine  (EPIPEN ) 0.3 mg/0.3 mL injection Inject 0.3 mL (0.3 mg total) under the skin once. (Patient taking differently: Inject 0.3 mL (0.3 mg total) under the skin once. prn)      ergocalciferol  (VITAMIN D2) 1,250 mcg (50,000 unit) capsule Take 1 capsule (1,250 mcg total) by mouth once a week. 8 capsule 0    magnesium oxide 250 mg (150 mg elemental) tablet Take 1 tablet by mouth two (2) times a day.      mirtazapine  (REMERON ) 15 MG tablet Take 1 tablet (15 mg total) by mouth nightly. 30 tablet 5    multivitamin (TAB-A-VITE/THERAGRAN) per tablet Take 1 tablet by mouth in the morning.      ondansetron  (ZOFRAN -ODT) 4 MG disintegrating tablet Dissolve 1 tablet on tongue every 8hours as needed for nausea 30 tablet 4    pantoprazole  (PROTONIX ) 40 MG tablet TAKE 1 TABLET DAILY 90 tablet 2    propylene glycol/peg 400 (SYSTANE OPHT) Administer 1 drop to both eyes daily as needed (dry/itchy eyes).      rifAXIMin  (XIFAXAN ) 550 mg Tab Take 1 tablet (550 mg total) by mouth two (2) times a day. 180 tablet 3    spironolactone  (ALDACTONE ) 50 MG tablet Take 1 tablet (50 mg total) by mouth daily. 90 tablet 3    ustekinumab  (STELARA ) 45 mg/0.5 mL Soln 90mg  subcutaneous every 8weeks 1 mL 5    ustekinumab  (STELARA ) 90 mg/mL Syrg syringe Inject 1 mL (90 mg total) under the skin once.      vit C/E/Zn/coppr/lutein/zeaxan (PRESERVISION AREDS 2 ORAL) Take 1 tablet by mouth two (  2) times a day.      white petrolatum (VASELINE) jelly Apply topically daily as needed.       No facility-administered medications prior to visit.   [2]   Past Medical History:  Diagnosis Date    Anal fissure 1983    Apnea, sleep     uses CPAP at night. instructed to bring DOS.     Arthritis     Basal cell carcinoma     Cancer    (CMS-HCC)     skin cancer    Cataract     CHF (congestive heart failure) (CMS-HCC)     Cirrhosis    (CMS-HCC) 04/15/2023    Crohn's disease    (CMS-HCC)     Diverticulitis of colon     Fatty liver     Gallstones     GERD (gastroesophageal reflux disease)     H/O echocardiogram     HL (hearing loss) 1980???s    Hypertension     Irritable bowel syndrome     Kidney stone     Liver cancer    (CMS-HCC) 04/2023    NASH (nonalcoholic steatohepatitis)     Osteoporosis     Squamous cell skin cancer     Syncope     dizziness    Tinnitus [place]    Flew in [place] during [place]    Type 2 diabetes mellitus, without long-term current use of insulin  (CMS-HCC) 10/24/2022   [3]   Past Surgical History:  Procedure Laterality Date    APPENDECTOMY      BOWEL RESECTION  1981, 1984, 1995    3 ft small intestine, <1 ft large intestines, cecum removed; 2nd bowel resection anal fistulas removed; 3rd bowel resection most of colon removed, colostomy placed.    BRONCHOSCOPY  1980???s    CARDIAC CATHETERIZATION      CARDIOVASCULAR STRESS TEST      CHOLECYSTECTOMY      COLON SURGERY      Same as small bowel surgery    COLOSTOMY  1995    EYE SURGERY  1/20 & 4/21    Cataract & vitrious removal    FRACTURE SURGERY  1961    Broken tibia and fibula    HERNIA REPAIR  2019    Done by Dr Day    IR ABLATION CRYO FIBRO W US  GUIDE  03/03/2024    IR ABLATION CRYO FIBRO W US  GUIDE 03/03/2024 Cathie Rue, MD IMG [facility]IR [facility]    IR EMBOLIZATION ORGAN ISCHEMIA, TUMORS, INFAR  09/26/2023    IR EMBOLIZATION ORGAN ISCHEMIA, TUMORS, INFAR 09/26/2023 Cathie Rue, MD IMG VIR [facility]    IR EMBOLIZATION ORGAN ISCHEMIA, TUMORS, INFAR  12/16/2023    IR EMBOLIZATION ORGAN ISCHEMIA, TUMORS, INFAR 12/16/2023 Cathie Rue, MD IMG VIR [facility]    KIDNEY SURGERY  My    LAPAROSCOPIC SMALL BOWEL RESECTION      LEG SURGERY Left     pins in shin    LITHOTRIPSY      PR COLONOSCOPY FLX DX W/COLLJ SPEC WHEN PFRMD  03/19/2018    Procedure: COLONOSCOPY WITH POSSIBLE BIOPSY, POSSIBLE POLYPECTOMY.;  Surgeon: Elsie Jackquline Crooked, MD;  Location: ENDO OR [facility];  Service: Gastroenterology    PR COLSC FLEXIBLE W/TRANSENDOSCOPIC BALLOON DILAT N/A 09/13/2022    Procedure: COLONOSCOPY, FLEXIBLE; WITH DILATION BY BALLOON, 1 OR MORE STRICTURES;  Surgeon: Conny Dorn Agent, MD;  [facility]: [facility];  Service: Gastroenterology    PR INCISION &  DRAINAGE ABSCESS SIMPLE/SINGLE N/A 08/11/2018    Procedure: INCISION AND DRAINAGE OF ABDOMINAL WALL ABSCESS;  Surgeon: Elveria Lewandowsky Day, MD;  Location: OR [place];  Service: General Surgery    PR LAP, INCISIONAL HERNIA REPAIR,REDUCIBLE N/A 06/02/2018    Procedure: LAPAROSCOPIC ASSISTED PARASTOMAL HERNIA REPAIR AND INCISIONAL HERNIA REPAIR WITH MESH;  Surgeon: Elveria Lewandowsky Day, MD;  Location: OR [place];  Service: General Surgery    PR VITRECTOMY,MECHANICAL Right 03/28/2020    Procedure: trans pars planar vitrectomy, right eye   ;  Surgeon: Olam Roger, MD;  Location: [facility] OR [place];  Service: Ophthalmology    PR XCAPSL CTRC RMVL INSJ IO LENS PROSTH W/O ECP Right 01/13/2019    Procedure: PHACOEMULSIFICATION OF CATARACT WITH INSERTION OF INTRAOCULAR LENS/TORIC LENS/ORA;  Surgeon: Jerel Jama Pear, MD;  Location: OR [facility];  Service: Ophthalmology    PR XCAPSL CTRC RMVL INSJ IO LENS PROSTH W/O ECP Left 01/27/2019    Procedure: PHACOEMULSIFICATION OF CATARACT WITH INSERTION OF INTRAOCULAR LENS/TORIC LENS/ORA;  Surgeon: Jerel Jama Pear, MD; Location: OR [facility];  Service: Ophthalmology    RESECTION SMALL BOWEL / CLOSURE ILEOSTOMY  1981    temporary ileostomy     REVISION COLOSTOMY  2009    SKIN BIOPSY      SMALL INTESTINE SURGERY  3x since 1984    Crohn???s disease    URETERAL STENT PLACEMENT  2008   [4]   Family History  Problem Relation Age of Onset    Heart disease Mother         pacemaker    Arthritis Mother     Heart failure Mother     Hypertension Mother     Hearing loss Mother 52    Dementia Father     Cancer Father         Basel cell and colon cancer    Basal cell carcinoma Father

## 2024-12-29 NOTE — Progress Notes (Signed)
 Patient Name: Thomas Rosales  Patient Age: 84 y.o.  Encounter Date: 12/29/2024   Attending Interventional Radiologist: Dr. Marsa Boas  Resident Interventional Radiologist: N/A     Referring Physician: Erick Prentice HERO, MD  [address]  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    He underwent an MRI after cryoablation. He experiences some mild discomfort post-procedure, which is manageable.    His sleep pattern is disrupted due to issues with his CPAP machine, which is currently malfunctioning. He is in the process of obtaining a new machine to address this issue.    He had labs done in December which showed good liver function and a normal AFP level. He has additional labs scheduled for the first week of February.      Past Medical History:  Past Medical History[1]    Past Surgical History:  Past Surgical History[2]    Family History:  Family History[3]     Allergies:  Allergies[4]     Anticoagulant/Antiplatelet Medications: No anticoagulant medications    OBJECTIVE     Physical Exam:  There were no vitals filed for this visit.  There is no height or weight on file to calculate BMI.    Physical exam was not performed as this visit was conducted via telehealth.    Point-of-care US  performed: Not performed    Pertinent Laboratory Values:  Lab Results   Component Value Date    WBC 8.9 11/08/2024    HGB 13.4 11/08/2024    HCT 39.1 11/08/2024    PLT 159 11/08/2024     Lab Results   Component Value Date    NA 145 11/08/2024    K 4.6 11/08/2024    PHOS 3.0 05/18/2023    MG 1.7 05/18/2023     Lab Results   Component Value Date    BUN 14 11/08/2024    CREATININE 1.13 11/08/2024     Lab Results   Component Value Date    ALBUMIN  2.8 (L) 11/08/2024     Lab Results   Component Value Date    BILIDIR 0.70 (H) 04/20/2023     Lab Results   Component Value Date    AST 76 (H) 11/08/2024    ALT 59 (H) 11/08/2024    ALKPHOS 221 (H) 11/08/2024     Lab Results   Component Value Date    INR 1.31 11/08/2024       No results found for: CA199    12/29/2024, labs were reviewed by me personally. Interpretation: Labs from 11/08/24 demonstrate satisfactory liver function - with tBili of 1.4.     Pertinent Imaging Studies: 12/29/2024, imaging was visualized and reviewed by me personally. Interpretation: MRI from 12/23/23 demostrate complete response in the Seg 6 HCC treated. No further action needed at this time.         Performance Status: 0 = Fully active, able to carry on all pre-disease performance without restriction    ASSESSMENT/PLAN     Thomas Rosales is a 84 y.o. male with Crohn's disease, NASH cirrhosis c/b HCC s/p TACE and now Cryoablation 03/03/2024 and 09/21/24.    Assessment &  Plan  Hepatocellular carcinoma  MRI confirmed complete treatment with no residual or new disease. Liver function and AFP were normal in December. Condition well-managed.  - Schedule 3 month MRI/lab follow up          Patient seen and discussed with Dr. Marsa Boas, who agrees with the above assessment and plan. Thank you for involving us  in the care of this patient.    Marolyn Boas, MD  12/29/2024 11:46 AM      The patient reports they are physically located in [place]  and is currently: at home. I conducted a phone visit.  I spent 10 minutes on the phone call with the patient on the date of service .            [1]   Past Medical History:  Diagnosis Date    Anal fissure 1983    Apnea, sleep     uses CPAP at night. instructed to bring DOS.     Basal cell carcinoma     Cancer    (CMS-HCC)     skin cancer    CHF (congestive heart failure) (CMS-HCC)     Cirrhosis    (CMS-HCC) 04/15/2023    Crohn's disease    (CMS-HCC)     Diverticulitis of colon     Fatty liver Gallstones     GERD (gastroesophageal reflux disease)     H/O echocardiogram     Hypertension     Irritable bowel syndrome     Kidney stone     NASH (nonalcoholic steatohepatitis)     Syncope     dizziness    Type 2 diabetes mellitus, without long-term current use of insulin  (CMS-HCC) 10/24/2022   [2]   Past Surgical History:  Procedure Laterality Date    APPENDECTOMY      BOWEL RESECTION  1981, 1984, 1995    3 ft small intestine, <1 ft large intestines, cecum removed; 2nd bowel resection anal fistulas removed; 3rd bowel resection most of colon removed, colostomy placed.    CARDIAC CATHETERIZATION      CARDIOVASCULAR STRESS TEST      CHOLECYSTECTOMY      COLON SURGERY      Same as small bowel surgery    COLOSTOMY  1995    EYE SURGERY  1/20 & 4/21    Cataract & vitrious removal    FRACTURE SURGERY  1961    Broken tibia and fibula    HERNIA REPAIR  2019    Done by Dr Day    IR ABLATION CRYO FIBRO W US  GUIDE  03/03/2024    IR ABLATION CRYO FIBRO W US  GUIDE 03/03/2024 Boas Marolyn, MD IMG VIR [facility]    IR EMBOLIZATION ORGAN ISCHEMIA, TUMORS, INFAR  09/26/2023    IR EMBOLIZATION ORGAN ISCHEMIA, TUMORS, INFAR 09/26/2023 Boas Marolyn, MD IMG VIR [facility]    IR EMBOLIZATION ORGAN ISCHEMIA, TUMORS, INFAR  12/16/2023    IR EMBOLIZATION ORGAN ISCHEMIA, TUMORS, INFAR 12/16/2023 Boas Marolyn, MD IMG VIR [facility]    LAPAROSCOPIC SMALL BOWEL RESECTION      LEG SURGERY Left     pins in shin    LITHOTRIPSY      PR COLONOSCOPY FLX DX W/COLLJ SPEC WHEN PFRMD  03/19/2018    Procedure: COLONOSCOPY WITH POSSIBLE BIOPSY, POSSIBLE POLYPECTOMY.;  Surgeon: Elsie Jackquline Crooked, MD;  Location: ENDO [facility];  Service: Gastroenterology    PR COLSC FLEXIBLE W/TRANSENDOSCOPIC BALLOON DILAT N/A 09/13/2022    Procedure:  COLONOSCOPY, FLEXIBLE; WITH DILATION BY BALLOON, 1 OR MORE STRICTURES;  Surgeon: Conny Dorn Agent, MD;  Location: [facility]ED[facility];  Service: Gastroenterology    PR INCISION & DRAINAGE ABSCESS SIMPLE/SINGLE N/A 08/11/2018    Procedure: INCISION AND DRAINAGE OF ABDOMINAL WALL ABSCESS;  Surgeon: Elveria Lewandowsky Day, MD;  Location: OR [place];  Service: General Surgery    PR LAP, INCISIONAL HERNIA REPAIR,REDUCIBLE N/A 06/02/2018    Procedure: LAPAROSCOPIC ASSISTED PARASTOMAL HERNIA REPAIR AND INCISIONAL HERNIA REPAIR WITH MESH;  Surgeon: Elveria Lewandowsky Day, MD;  Location: OR [place];  Service: General Surgery    PR VITRECTOMY,MECHANICAL Right 03/28/2020    Procedure: trans pars planar vitrectomy, right eye   ;  Surgeon: Olam Roger, MD;  Location: [facility] OR [place];  Service: Ophthalmology    PR XCAPSL CTRC RMVL INSJ IO LENS PROSTH W/O ECP Right 01/13/2019    Procedure: PHACOEMULSIFICATION OF CATARACT WITH INSERTION OF INTRAOCULAR LENS/TORIC LENS/ORA;  Surgeon: Jerel Jama Pear, MD;  Location: OR [facility];  Service: Ophthalmology    PR XCAPSL CTRC RMVL INSJ IO LENS PROSTH W/O ECP Left 01/27/2019    Procedure: PHACOEMULSIFICATION OF CATARACT WITH INSERTION OF INTRAOCULAR LENS/TORIC LENS/ORA;  Surgeon: Jerel Jama Pear, MD;  Location: OR [facility];  Service: Ophthalmology    RESECTION SMALL BOWEL / CLOSURE ILEOSTOMY  1981    temporary ileostomy     REVISION COLOSTOMY  2009    SMALL INTESTINE SURGERY  3x since 1984    Crohn???s disease    URETERAL STENT PLACEMENT  2008   [3]   Family History  Problem Relation Age of Onset    Heart disease Mother         pacemaker    Arthritis Mother     Dementia Father     Cancer Father    [4] No Known Allergies

## 2024-12-31 LAB — SURGICAL PATHOLOGY

## 2025-01-03 ENCOUNTER — Ambulatory Visit: Payer: Self-pay | Admitting: Dermatology

## 2025-01-04 NOTE — Telephone Encounter (Signed)
-----   Message from Boneta Sharps, MD sent at 01/03/2025  7:10 PM EST ----- Diagnosis: left wrist - posterior :       SEBORRHEIC KERATOSIS, IRRITATED, WITH TOP OF DILATED CYSTIC FOLLICULAR       INFUNDIBULUM, CONTAINING NEUTROPHILS, SEE DESCRIPTION   Plan: please call to share biopsy from left wrist shows no skin cancer. Only benign keratosis and inflamed hair follicle. Recheck in 2 months

## 2025-01-04 NOTE — Telephone Encounter (Signed)
 Spoke with patient and advised of biopsy results. Two month follow-up scheduled with Dr. Claudene.

## 2025-01-05 MED ORDER — SPIRONOLACTONE 50 MG TABLET
ORAL_TABLET | Freq: Every day | ORAL | 3 refills | 0.00000 days
Start: 2025-01-05 — End: ?

## 2025-01-07 MED ORDER — SPIRONOLACTONE 50 MG TABLET
ORAL_TABLET | Freq: Every day | ORAL | 3 refills | 90.00000 days
Start: 2025-01-07 — End: ?

## 2025-01-10 MED ORDER — SPIRONOLACTONE 50 MG TABLET
ORAL_TABLET | Freq: Every day | ORAL | 3 refills | 90.00000 days | Status: CP
Start: 2025-01-10 — End: 2026-01-10

## 2025-01-12 LAB — AFP TUMOR MARKER: AFP-TUMOR MARKER: 7 ng/mL (ref <=8)

## 2025-01-12 LAB — COMPREHENSIVE METABOLIC PANEL
ALBUMIN: 2.9 g/dL — ABNORMAL LOW (ref 3.4–5.0)
ALKALINE PHOSPHATASE: 205 U/L — ABNORMAL HIGH (ref 46–116)
ALT (SGPT): 72 U/L — ABNORMAL HIGH (ref 10–49)
ANION GAP: 13 mmol/L (ref 5–14)
AST (SGOT): 100 U/L — ABNORMAL HIGH (ref <=34)
BILIRUBIN TOTAL: 1.6 mg/dL — ABNORMAL HIGH (ref 0.3–1.2)
BLOOD UREA NITROGEN: 18 mg/dL (ref 9–23)
BUN / CREAT RATIO: 13
CALCIUM: 8.8 mg/dL (ref 8.7–10.4)
CHLORIDE: 108 mmol/L — ABNORMAL HIGH (ref 98–107)
CO2: 20.8 mmol/L (ref 20.0–31.0)
CREATININE: 1.34 mg/dL — ABNORMAL HIGH (ref 0.73–1.18)
EGFR CKD-EPI (2021) MALE: 53 mL/min/{1.73_m2} — ABNORMAL LOW (ref >=60)
GLUCOSE RANDOM: 142 mg/dL (ref 70–179)
POTASSIUM: 4.4 mmol/L (ref 3.4–4.8)
PROTEIN TOTAL: 7.8 g/dL (ref 5.7–8.2)
SODIUM: 142 mmol/L (ref 135–145)

## 2025-01-12 LAB — CBC W/ AUTO DIFF
BASOPHILS ABSOLUTE COUNT: 0.1 10*9/L (ref 0.0–0.1)
BASOPHILS RELATIVE PERCENT: 1 %
EOSINOPHILS ABSOLUTE COUNT: 0.4 10*9/L (ref 0.0–0.5)
EOSINOPHILS RELATIVE PERCENT: 4.8 %
HEMATOCRIT: 39.3 % (ref 39.0–48.0)
HEMOGLOBIN: 13.9 g/dL (ref 12.9–16.5)
LYMPHOCYTES ABSOLUTE COUNT: 1.3 10*9/L (ref 1.1–3.6)
LYMPHOCYTES RELATIVE PERCENT: 14.7 %
MEAN CORPUSCULAR HEMOGLOBIN CONC: 35.5 g/dL (ref 32.0–36.0)
MEAN CORPUSCULAR HEMOGLOBIN: 34.4 pg — ABNORMAL HIGH (ref 25.9–32.4)
MEAN CORPUSCULAR VOLUME: 96.9 fL — ABNORMAL HIGH (ref 77.6–95.7)
MEAN PLATELET VOLUME: 9.5 fL (ref 6.8–10.7)
MONOCYTES ABSOLUTE COUNT: 1.2 10*9/L — ABNORMAL HIGH (ref 0.3–0.8)
MONOCYTES RELATIVE PERCENT: 13.1 %
NEUTROPHILS ABSOLUTE COUNT: 5.9 10*9/L (ref 1.8–7.8)
NEUTROPHILS RELATIVE PERCENT: 66.4 %
NUCLEATED RED BLOOD CELLS: 0 /100{WBCs} (ref <=4)
PLATELET COUNT: 157 10*9/L (ref 150–450)
RED BLOOD CELL COUNT: 4.06 10*12/L — ABNORMAL LOW (ref 4.26–5.60)
RED CELL DISTRIBUTION WIDTH: 15.6 % — ABNORMAL HIGH (ref 12.2–15.2)
WBC ADJUSTED: 8.8 10*9/L (ref 3.6–11.2)

## 2025-01-12 LAB — PROTIME-INR
INR: 1.35
PROTIME: 15.3 s — ABNORMAL HIGH (ref 9.9–12.6)

## 2025-03-07 ENCOUNTER — Ambulatory Visit: Admitting: Dermatology

## 2025-06-27 ENCOUNTER — Encounter: Admitting: Dermatology
